# Patient Record
Sex: Female | Born: 1950 | Race: White | Hispanic: No | Marital: Married | Smoking: Former smoker
Health system: Southern US, Community
[De-identification: ages and names within clinical notes are randomized; demographics above are authoritative.]

## PROBLEM LIST (undated history)

## (undated) DIAGNOSIS — IMO0002 Reserved for concepts with insufficient information to code with codable children: Secondary | ICD-10-CM

## (undated) DIAGNOSIS — Z853 Personal history of malignant neoplasm of breast: Secondary | ICD-10-CM

## (undated) DIAGNOSIS — IMO0001 Reserved for inherently not codable concepts without codable children: Secondary | ICD-10-CM

## (undated) DIAGNOSIS — I341 Nonrheumatic mitral (valve) prolapse: Secondary | ICD-10-CM

## (undated) DIAGNOSIS — S329XXA Fracture of unspecified parts of lumbosacral spine and pelvis, initial encounter for closed fracture: Secondary | ICD-10-CM

## (undated) DIAGNOSIS — Z87440 Personal history of urinary (tract) infections: Secondary | ICD-10-CM

## (undated) DIAGNOSIS — C50919 Malignant neoplasm of unspecified site of unspecified female breast: Secondary | ICD-10-CM

## (undated) DIAGNOSIS — C7951 Secondary malignant neoplasm of bone: Secondary | ICD-10-CM

## (undated) HISTORY — PX: LASIK: SHX215

## (undated) HISTORY — PX: TONSILLECTOMY AND ADENOIDECTOMY: SUR1326

## (undated) HISTORY — DX: Fracture of unspecified parts of lumbosacral spine and pelvis, initial encounter for closed fracture: S32.9XXA

## (undated) HISTORY — DX: Malignant neoplasm of unspecified site of unspecified female breast: C50.919

## (undated) HISTORY — DX: Personal history of malignant neoplasm of breast: Z85.3

---

## 1992-01-23 HISTORY — PX: REDUCTION MAMMAPLASTY: SUR839

## 2002-01-22 HISTORY — PX: OTHER SURGICAL HISTORY: SHX169

## 2007-08-04 ENCOUNTER — Encounter: Admission: RE | Admit: 2007-08-04 | Discharge: 2007-08-04 | Payer: Self-pay | Admitting: Internal Medicine

## 2007-08-11 ENCOUNTER — Encounter: Admission: RE | Admit: 2007-08-11 | Discharge: 2007-08-11 | Payer: Self-pay | Admitting: Internal Medicine

## 2008-03-22 ENCOUNTER — Other Ambulatory Visit: Admission: RE | Admit: 2008-03-22 | Discharge: 2008-03-22 | Payer: Self-pay | Admitting: Obstetrics and Gynecology

## 2008-08-04 ENCOUNTER — Encounter: Admission: RE | Admit: 2008-08-04 | Discharge: 2008-08-04 | Payer: Self-pay | Admitting: Internal Medicine

## 2008-08-06 ENCOUNTER — Encounter: Admission: RE | Admit: 2008-08-06 | Discharge: 2008-08-06 | Payer: Self-pay | Admitting: Internal Medicine

## 2008-12-22 DIAGNOSIS — C50919 Malignant neoplasm of unspecified site of unspecified female breast: Secondary | ICD-10-CM

## 2008-12-22 HISTORY — DX: Malignant neoplasm of unspecified site of unspecified female breast: C50.919

## 2009-01-13 ENCOUNTER — Encounter: Admission: RE | Admit: 2009-01-13 | Discharge: 2009-01-13 | Payer: Self-pay | Admitting: Internal Medicine

## 2009-01-18 ENCOUNTER — Encounter: Admission: RE | Admit: 2009-01-18 | Discharge: 2009-01-18 | Payer: Self-pay | Admitting: Internal Medicine

## 2009-01-18 DIAGNOSIS — Z853 Personal history of malignant neoplasm of breast: Secondary | ICD-10-CM

## 2009-01-19 ENCOUNTER — Encounter: Admission: RE | Admit: 2009-01-19 | Discharge: 2009-01-19 | Payer: Self-pay | Admitting: Internal Medicine

## 2009-01-22 HISTORY — PX: MASTECTOMY: SHX3

## 2009-01-24 ENCOUNTER — Encounter: Admission: RE | Admit: 2009-01-24 | Discharge: 2009-01-24 | Payer: Self-pay | Admitting: Internal Medicine

## 2009-01-25 ENCOUNTER — Ambulatory Visit: Payer: Self-pay | Admitting: Oncology

## 2009-01-27 ENCOUNTER — Encounter: Admission: RE | Admit: 2009-01-27 | Discharge: 2009-01-27 | Payer: Self-pay | Admitting: General Surgery

## 2009-01-28 ENCOUNTER — Encounter: Payer: Self-pay | Admitting: Oncology

## 2009-01-28 ENCOUNTER — Ambulatory Visit: Payer: Self-pay

## 2009-01-28 ENCOUNTER — Ambulatory Visit (HOSPITAL_COMMUNITY): Admission: RE | Admit: 2009-01-28 | Discharge: 2009-01-28 | Payer: Self-pay | Admitting: Oncology

## 2009-01-28 ENCOUNTER — Ambulatory Visit: Payer: Self-pay | Admitting: Cardiovascular Disease

## 2009-01-28 LAB — CBC WITH DIFFERENTIAL/PLATELET
BASO%: 0.4 % (ref 0.0–2.0)
Eosinophils Absolute: 0.1 10*3/uL (ref 0.0–0.5)
HGB: 13.8 g/dL (ref 11.6–15.9)
MCHC: 34 g/dL (ref 31.5–36.0)
MCV: 89.7 fL (ref 79.5–101.0)
MONO#: 0.4 10*3/uL (ref 0.1–0.9)
NEUT#: 3.2 10*3/uL (ref 1.5–6.5)
Platelets: 276 10*3/uL (ref 145–400)
RDW: 12.2 % (ref 11.2–14.5)
WBC: 5.3 10*3/uL (ref 3.9–10.3)

## 2009-01-28 LAB — COMPREHENSIVE METABOLIC PANEL
ALT: 24 U/L (ref 0–35)
AST: 17 U/L (ref 0–37)
Albumin: 4.3 g/dL (ref 3.5–5.2)
Alkaline Phosphatase: 66 U/L (ref 39–117)
BUN: 15 mg/dL (ref 6–23)
Glucose, Bld: 109 mg/dL — ABNORMAL HIGH (ref 70–99)
Potassium: 3.7 mEq/L (ref 3.5–5.3)
Sodium: 141 mEq/L (ref 135–145)
Total Bilirubin: 0.4 mg/dL (ref 0.3–1.2)

## 2009-01-31 ENCOUNTER — Ambulatory Visit (HOSPITAL_BASED_OUTPATIENT_CLINIC_OR_DEPARTMENT_OTHER): Admission: RE | Admit: 2009-01-31 | Discharge: 2009-01-31 | Payer: Self-pay | Admitting: General Surgery

## 2009-02-01 ENCOUNTER — Ambulatory Visit (HOSPITAL_COMMUNITY): Admission: RE | Admit: 2009-02-01 | Discharge: 2009-02-01 | Payer: Self-pay | Admitting: Oncology

## 2009-02-15 LAB — CBC WITH DIFFERENTIAL/PLATELET
Basophils Absolute: 0 10*3/uL (ref 0.0–0.1)
Eosinophils Absolute: 0.3 10*3/uL (ref 0.0–0.5)
HCT: 38.2 % (ref 34.8–46.6)
HGB: 13.1 g/dL (ref 11.6–15.9)
MCHC: 34.3 g/dL (ref 31.5–36.0)
MCV: 89.4 fL (ref 79.5–101.0)
MONO#: 0.3 10*3/uL (ref 0.1–0.9)
MONO%: 6.4 % (ref 0.0–14.0)
NEUT%: 55.9 % (ref 38.4–76.8)
RDW: 12 % (ref 11.2–14.5)
lymph#: 1.4 10*3/uL (ref 0.9–3.3)

## 2009-02-22 LAB — CBC WITH DIFFERENTIAL/PLATELET
Basophils Absolute: 0.3 10*3/uL — ABNORMAL HIGH (ref 0.0–0.1)
Eosinophils Absolute: 0.1 10*3/uL (ref 0.0–0.5)
HCT: 41.8 % (ref 34.8–46.6)
HGB: 14.2 g/dL (ref 11.6–15.9)
LYMPH%: 22.8 % (ref 14.0–49.7)
MCV: 88.2 fL (ref 79.5–101.0)
MONO%: 9.4 % (ref 0.0–14.0)
NEUT#: 6.6 10*3/uL — ABNORMAL HIGH (ref 1.5–6.5)
Platelets: 186 10*3/uL (ref 145–400)

## 2009-02-25 ENCOUNTER — Ambulatory Visit: Payer: Self-pay | Admitting: Oncology

## 2009-02-28 ENCOUNTER — Ambulatory Visit: Payer: Self-pay | Admitting: Psychiatry

## 2009-03-01 LAB — CBC WITH DIFFERENTIAL/PLATELET
BASO%: 0.4 % (ref 0.0–2.0)
Basophils Absolute: 0 10*3/uL (ref 0.0–0.1)
EOS%: 0.5 % (ref 0.0–7.0)
HCT: 35.3 % (ref 34.8–46.6)
HGB: 12.2 g/dL (ref 11.6–15.9)
LYMPH%: 13.3 % — ABNORMAL LOW (ref 14.0–49.7)
MCH: 31.2 pg (ref 25.1–34.0)
MCHC: 34.6 g/dL (ref 31.5–36.0)
MONO#: 0.2 10*3/uL (ref 0.1–0.9)
NEUT%: 81.4 % — ABNORMAL HIGH (ref 38.4–76.8)
Platelets: 237 10*3/uL (ref 145–400)

## 2009-03-08 LAB — CBC WITH DIFFERENTIAL/PLATELET
BASO%: 0.3 % (ref 0.0–2.0)
EOS%: 0.6 % (ref 0.0–7.0)
HCT: 37.3 % (ref 34.8–46.6)
LYMPH%: 15.1 % (ref 14.0–49.7)
MCH: 31.1 pg (ref 25.1–34.0)
MCHC: 34 g/dL (ref 31.5–36.0)
MCV: 91.3 fL (ref 79.5–101.0)
MONO%: 8.8 % (ref 0.0–14.0)
NEUT%: 75.2 % (ref 38.4–76.8)
lymph#: 1.5 10*3/uL (ref 0.9–3.3)

## 2009-03-14 LAB — CBC WITH DIFFERENTIAL/PLATELET
BASO%: 0.1 % (ref 0.0–2.0)
EOS%: 0.3 % (ref 0.0–7.0)
HGB: 11.9 g/dL (ref 11.6–15.9)
MCH: 31.4 pg (ref 25.1–34.0)
MCHC: 34.5 g/dL (ref 31.5–36.0)
MCV: 90.9 fL (ref 79.5–101.0)
MONO%: 1.5 % (ref 0.0–14.0)
RBC: 3.8 10*6/uL (ref 3.70–5.45)
RDW: 13.3 % (ref 11.2–14.5)
lymph#: 0.7 10*3/uL — ABNORMAL LOW (ref 0.9–3.3)

## 2009-03-14 LAB — COMPREHENSIVE METABOLIC PANEL
ALT: 9 U/L (ref 0–35)
AST: 11 U/L (ref 0–37)
Albumin: 4.3 g/dL (ref 3.5–5.2)
Alkaline Phosphatase: 145 U/L — ABNORMAL HIGH (ref 39–117)
Calcium: 8.9 mg/dL (ref 8.4–10.5)
Chloride: 103 mEq/L (ref 96–112)
Potassium: 4.1 mEq/L (ref 3.5–5.3)
Sodium: 137 mEq/L (ref 135–145)

## 2009-03-22 LAB — CBC WITH DIFFERENTIAL/PLATELET
BASO%: 2 % (ref 0.0–2.0)
EOS%: 0.9 % (ref 0.0–7.0)
HCT: 37.2 % (ref 34.8–46.6)
LYMPH%: 13.3 % — ABNORMAL LOW (ref 14.0–49.7)
MCH: 30.4 pg (ref 25.1–34.0)
MCHC: 33.3 g/dL (ref 31.5–36.0)
NEUT%: 71.6 % (ref 38.4–76.8)
Platelets: 208 10*3/uL (ref 145–400)
RBC: 4.08 10*6/uL (ref 3.70–5.45)
lymph#: 1.5 10*3/uL (ref 0.9–3.3)
nRBC: 0 % (ref 0–0)

## 2009-03-24 ENCOUNTER — Ambulatory Visit: Payer: Self-pay | Admitting: Oncology

## 2009-03-25 ENCOUNTER — Encounter: Admission: RE | Admit: 2009-03-25 | Discharge: 2009-03-25 | Payer: Self-pay | Admitting: Oncology

## 2009-03-28 LAB — CBC WITH DIFFERENTIAL/PLATELET
BASO%: 1.1 % (ref 0.0–2.0)
EOS%: 0.2 % (ref 0.0–7.0)
MCH: 30.4 pg (ref 25.1–34.0)
MCV: 91.5 fL (ref 79.5–101.0)
MONO%: 1.8 % (ref 0.0–14.0)
RBC: 3.98 10*6/uL (ref 3.70–5.45)
RDW: 14.4 % (ref 11.2–14.5)
nRBC: 0 % (ref 0–0)

## 2009-03-31 ENCOUNTER — Encounter: Admission: RE | Admit: 2009-03-31 | Discharge: 2009-03-31 | Payer: Self-pay | Admitting: Oncology

## 2009-04-04 LAB — CBC WITH DIFFERENTIAL/PLATELET
BASO%: 1.3 % (ref 0.0–2.0)
LYMPH%: 10.2 % — ABNORMAL LOW (ref 14.0–49.7)
MCHC: 33.5 g/dL (ref 31.5–36.0)
MONO#: 1.6 10*3/uL — ABNORMAL HIGH (ref 0.1–0.9)
RBC: 3.88 10*6/uL (ref 3.70–5.45)
RDW: 15.6 % — ABNORMAL HIGH (ref 11.2–14.5)
WBC: 12.8 10*3/uL — ABNORMAL HIGH (ref 3.9–10.3)
lymph#: 1.3 10*3/uL (ref 0.9–3.3)
nRBC: 0 % (ref 0–0)

## 2009-04-12 LAB — CBC WITH DIFFERENTIAL/PLATELET
BASO%: 0.9 % (ref 0.0–2.0)
Basophils Absolute: 0.1 10*3/uL (ref 0.0–0.1)
EOS%: 1 % (ref 0.0–7.0)
HGB: 11.5 g/dL — ABNORMAL LOW (ref 11.6–15.9)
MCH: 30.7 pg (ref 25.1–34.0)
NEUT#: 4.3 10*3/uL (ref 1.5–6.5)
RDW: 15.6 % — ABNORMAL HIGH (ref 11.2–14.5)
lymph#: 0.7 10*3/uL — ABNORMAL LOW (ref 0.9–3.3)

## 2009-04-19 LAB — CBC WITH DIFFERENTIAL/PLATELET
Basophils Absolute: 0.1 10*3/uL (ref 0.0–0.1)
Eosinophils Absolute: 0.1 10*3/uL (ref 0.0–0.5)
HGB: 11.4 g/dL — ABNORMAL LOW (ref 11.6–15.9)
MCV: 92.7 fL (ref 79.5–101.0)
MONO#: 0.6 10*3/uL (ref 0.1–0.9)
NEUT#: 2.9 10*3/uL (ref 1.5–6.5)
RDW: 15.4 % — ABNORMAL HIGH (ref 11.2–14.5)
lymph#: 0.9 10*3/uL (ref 0.9–3.3)

## 2009-04-26 ENCOUNTER — Ambulatory Visit: Payer: Self-pay | Admitting: Oncology

## 2009-04-26 LAB — CBC WITH DIFFERENTIAL/PLATELET
BASO%: 0.7 % (ref 0.0–2.0)
Basophils Absolute: 0 10*3/uL (ref 0.0–0.1)
EOS%: 4.1 % (ref 0.0–7.0)
HGB: 11.4 g/dL — ABNORMAL LOW (ref 11.6–15.9)
MCH: 31.1 pg (ref 25.1–34.0)
MCHC: 33.1 g/dL (ref 31.5–36.0)
MCV: 93.7 fL (ref 79.5–101.0)
MONO%: 11.4 % (ref 0.0–14.0)
RBC: 3.67 10*6/uL — ABNORMAL LOW (ref 3.70–5.45)
RDW: 15.2 % — ABNORMAL HIGH (ref 11.2–14.5)
lymph#: 0.9 10*3/uL (ref 0.9–3.3)

## 2009-05-03 LAB — CBC WITH DIFFERENTIAL/PLATELET
Basophils Absolute: 0 10*3/uL (ref 0.0–0.1)
Eosinophils Absolute: 0.2 10*3/uL (ref 0.0–0.5)
HGB: 12 g/dL (ref 11.6–15.9)
MCV: 94.4 fL (ref 79.5–101.0)
MONO%: 9.3 % (ref 0.0–14.0)
NEUT#: 2.8 10*3/uL (ref 1.5–6.5)
RBC: 3.76 10*6/uL (ref 3.70–5.45)
RDW: 14.3 % (ref 11.2–14.5)
WBC: 4.5 10*3/uL (ref 3.9–10.3)
lymph#: 1 10*3/uL (ref 0.9–3.3)

## 2009-05-10 LAB — CBC WITH DIFFERENTIAL/PLATELET
Basophils Absolute: 0 10*3/uL (ref 0.0–0.1)
Eosinophils Absolute: 0.2 10*3/uL (ref 0.0–0.5)
HGB: 12.3 g/dL (ref 11.6–15.9)
LYMPH%: 24.9 % (ref 14.0–49.7)
MCV: 95.1 fL (ref 79.5–101.0)
MONO#: 0.3 10*3/uL (ref 0.1–0.9)
NEUT#: 2.6 10*3/uL (ref 1.5–6.5)
Platelets: 212 10*3/uL (ref 145–400)
RBC: 3.88 10*6/uL (ref 3.70–5.45)
RDW: 13.8 % (ref 11.2–14.5)
WBC: 4.1 10*3/uL (ref 3.9–10.3)

## 2009-05-13 ENCOUNTER — Encounter: Admission: RE | Admit: 2009-05-13 | Discharge: 2009-05-13 | Payer: Self-pay | Admitting: Oncology

## 2009-05-17 LAB — CBC WITH DIFFERENTIAL/PLATELET
Basophils Absolute: 0 10*3/uL (ref 0.0–0.1)
Eosinophils Absolute: 0.2 10*3/uL (ref 0.0–0.5)
HGB: 12.6 g/dL (ref 11.6–15.9)
MONO#: 0.5 10*3/uL (ref 0.1–0.9)
NEUT#: 2.7 10*3/uL (ref 1.5–6.5)
RBC: 3.95 10*6/uL (ref 3.70–5.45)
RDW: 13.1 % (ref 11.2–14.5)
WBC: 4.4 10*3/uL (ref 3.9–10.3)
lymph#: 1 10*3/uL (ref 0.9–3.3)

## 2009-05-17 LAB — COMPREHENSIVE METABOLIC PANEL
Albumin: 4 g/dL (ref 3.5–5.2)
BUN: 9 mg/dL (ref 6–23)
CO2: 27 mEq/L (ref 19–32)
Glucose, Bld: 96 mg/dL (ref 70–99)
Potassium: 4 mEq/L (ref 3.5–5.3)
Sodium: 138 mEq/L (ref 135–145)
Total Protein: 6.7 g/dL (ref 6.0–8.3)

## 2009-05-18 ENCOUNTER — Ambulatory Visit (HOSPITAL_COMMUNITY): Admission: RE | Admit: 2009-05-18 | Discharge: 2009-05-18 | Payer: Self-pay | Admitting: Oncology

## 2009-05-24 LAB — CBC WITH DIFFERENTIAL/PLATELET
BASO%: 0.2 % (ref 0.0–2.0)
Basophils Absolute: 0 10*3/uL (ref 0.0–0.1)
EOS%: 5 % (ref 0.0–7.0)
HCT: 36.6 % (ref 34.8–46.6)
HGB: 12.3 g/dL (ref 11.6–15.9)
LYMPH%: 25.2 % (ref 14.0–49.7)
MCH: 32 pg (ref 25.1–34.0)
MCHC: 33.6 g/dL (ref 31.5–36.0)
MONO#: 0.3 10*3/uL (ref 0.1–0.9)
NEUT%: 62.4 % (ref 38.4–76.8)
Platelets: 232 10*3/uL (ref 145–400)

## 2009-05-30 ENCOUNTER — Ambulatory Visit: Payer: Self-pay | Admitting: Oncology

## 2009-05-31 LAB — CBC WITH DIFFERENTIAL/PLATELET
BASO%: 1.2 % (ref 0.0–2.0)
Basophils Absolute: 0.1 10*3/uL (ref 0.0–0.1)
EOS%: 4 % (ref 0.0–7.0)
HCT: 37.3 % (ref 34.8–46.6)
MCH: 33.3 pg (ref 25.1–34.0)
MCHC: 34.8 g/dL (ref 31.5–36.0)
MCV: 95.7 fL (ref 79.5–101.0)
MONO%: 10 % (ref 0.0–14.0)
NEUT%: 63.9 % (ref 38.4–76.8)
lymph#: 0.9 10*3/uL (ref 0.9–3.3)

## 2009-06-03 ENCOUNTER — Encounter: Admission: RE | Admit: 2009-06-03 | Discharge: 2009-06-03 | Payer: Self-pay | Admitting: Oncology

## 2009-06-13 ENCOUNTER — Inpatient Hospital Stay (HOSPITAL_COMMUNITY): Admission: RE | Admit: 2009-06-13 | Discharge: 2009-06-15 | Payer: Self-pay | Admitting: Surgery

## 2009-06-13 ENCOUNTER — Encounter (INDEPENDENT_AMBULATORY_CARE_PROVIDER_SITE_OTHER): Payer: Self-pay | Admitting: Surgery

## 2009-06-23 ENCOUNTER — Ambulatory Visit: Admission: RE | Admit: 2009-06-23 | Discharge: 2009-09-08 | Payer: Self-pay | Admitting: Radiation Oncology

## 2009-06-30 ENCOUNTER — Ambulatory Visit: Payer: Self-pay | Admitting: Oncology

## 2009-07-04 LAB — COMPREHENSIVE METABOLIC PANEL
ALT: 26 U/L (ref 0–35)
AST: 23 U/L (ref 0–37)
Albumin: 3.9 g/dL (ref 3.5–5.2)
Calcium: 9.6 mg/dL (ref 8.4–10.5)
Chloride: 105 mEq/L (ref 96–112)
Potassium: 4.5 mEq/L (ref 3.5–5.3)
Sodium: 141 mEq/L (ref 135–145)

## 2009-07-04 LAB — CBC WITH DIFFERENTIAL/PLATELET
BASO%: 0.5 % (ref 0.0–2.0)
Basophils Absolute: 0 10*3/uL (ref 0.0–0.1)
EOS%: 9.6 % — ABNORMAL HIGH (ref 0.0–7.0)
HGB: 12.6 g/dL (ref 11.6–15.9)
MCH: 31.7 pg (ref 25.1–34.0)
MCHC: 33.8 g/dL (ref 31.5–36.0)
RBC: 3.96 10*6/uL (ref 3.70–5.45)
RDW: 11.9 % (ref 11.2–14.5)
lymph#: 0.8 10*3/uL — ABNORMAL LOW (ref 0.9–3.3)

## 2009-08-25 ENCOUNTER — Ambulatory Visit: Payer: Self-pay | Admitting: Oncology

## 2009-08-29 LAB — CBC WITH DIFFERENTIAL/PLATELET
BASO%: 0.3 % (ref 0.0–2.0)
Basophils Absolute: 0 10*3/uL (ref 0.0–0.1)
Eosinophils Absolute: 0.1 10*3/uL (ref 0.0–0.5)
HGB: 13 g/dL (ref 11.6–15.9)
LYMPH%: 10.9 % — ABNORMAL LOW (ref 14.0–49.7)
MCHC: 34.5 g/dL (ref 31.5–36.0)
MCV: 89.3 fL (ref 79.5–101.0)
MONO#: 0.5 10*3/uL (ref 0.1–0.9)
MONO%: 8.7 % (ref 0.0–14.0)
NEUT#: 4.1 10*3/uL (ref 1.5–6.5)
NEUT%: 78.6 % — ABNORMAL HIGH (ref 38.4–76.8)
RBC: 4.21 10*6/uL (ref 3.70–5.45)

## 2009-08-29 LAB — COMPREHENSIVE METABOLIC PANEL
ALT: 20 U/L (ref 0–35)
Albumin: 4.5 g/dL (ref 3.5–5.2)
Calcium: 9.7 mg/dL (ref 8.4–10.5)
Chloride: 100 mEq/L (ref 96–112)
Total Bilirubin: 0.3 mg/dL (ref 0.3–1.2)

## 2009-11-22 ENCOUNTER — Ambulatory Visit: Payer: Self-pay | Admitting: Oncology

## 2009-11-24 LAB — CANCER ANTIGEN 27.29: CA 27.29: 14 U/mL (ref 0–39)

## 2009-11-24 LAB — CBC WITH DIFFERENTIAL/PLATELET
BASO%: 0.2 % (ref 0.0–2.0)
Basophils Absolute: 0 10*3/uL (ref 0.0–0.1)
EOS%: 3.7 % (ref 0.0–7.0)
HGB: 13.2 g/dL (ref 11.6–15.9)
MCH: 31.1 pg (ref 25.1–34.0)
MCHC: 34.7 g/dL (ref 31.5–36.0)
MCV: 89.6 fL (ref 79.5–101.0)
MONO%: 11.1 % (ref 0.0–14.0)
NEUT%: 64.3 % (ref 38.4–76.8)
RDW: 12.3 % (ref 11.2–14.5)
lymph#: 1 10*3/uL (ref 0.9–3.3)

## 2009-11-24 LAB — COMPREHENSIVE METABOLIC PANEL
Albumin: 4.7 g/dL (ref 3.5–5.2)
BUN: 13 mg/dL (ref 6–23)
CO2: 24 mEq/L (ref 19–32)
Calcium: 9.5 mg/dL (ref 8.4–10.5)
Chloride: 105 mEq/L (ref 96–112)
Creatinine, Ser: 0.87 mg/dL (ref 0.40–1.20)
Glucose, Bld: 82 mg/dL (ref 70–99)
Potassium: 4.4 mEq/L (ref 3.5–5.3)

## 2010-02-11 ENCOUNTER — Other Ambulatory Visit: Payer: Self-pay | Admitting: Obstetrics & Gynecology

## 2010-02-11 DIAGNOSIS — M8580 Other specified disorders of bone density and structure, unspecified site: Secondary | ICD-10-CM

## 2010-04-05 ENCOUNTER — Ambulatory Visit
Admission: RE | Admit: 2010-04-05 | Discharge: 2010-04-05 | Disposition: A | Discharge status: Home / Self Care | Payer: BC Managed Care – PPO | Source: Ambulatory Visit | Attending: Obstetrics & Gynecology | Admitting: Obstetrics & Gynecology

## 2010-04-05 DIAGNOSIS — M8580 Other specified disorders of bone density and structure, unspecified site: Secondary | ICD-10-CM

## 2010-04-09 LAB — COMPREHENSIVE METABOLIC PANEL
ALT: 32 U/L (ref 0–35)
AST: 26 U/L (ref 0–37)
Alkaline Phosphatase: 65 U/L (ref 39–117)
CO2: 28 mEq/L (ref 19–32)
Calcium: 9.8 mg/dL (ref 8.4–10.5)
GFR calc Af Amer: >60 mL/min (ref >60)
GFR calc non Af Amer: >60 mL/min (ref >60)
Glucose, Bld: 123 mg/dL — ABNORMAL HIGH (ref 70–99)
Potassium: 3.9 mEq/L (ref 3.5–5.1)
Sodium: 139 mEq/L (ref 135–145)
Total Protein: 6.4 g/dL (ref 6.0–8.3)

## 2010-04-09 LAB — CBC
Hemoglobin: 13.8 g/dL (ref 12.0–15.0)
MCHC: 34.7 g/dL (ref 30.0–36.0)
RBC: 4.44 MIL/uL (ref 3.87–5.11)

## 2010-04-09 LAB — DIFFERENTIAL
Basophils Relative: 0 % (ref 0–1)
Eosinophils Absolute: 0 10*3/uL (ref 0.0–0.7)
Eosinophils Relative: 1 % (ref 0–5)
Lymphs Abs: 1.6 10*3/uL (ref 0.7–4.0)
Monocytes Relative: 6 % (ref 3–12)
Neutrophils Relative %: 64 % (ref 43–77)

## 2010-04-09 LAB — GLUCOSE, CAPILLARY: Glucose-Capillary: 89 mg/dL (ref 70–99)

## 2010-04-10 LAB — BASIC METABOLIC PANEL
CO2: 25 mEq/L (ref 19–32)
Calcium: 9.6 mg/dL (ref 8.4–10.5)
Chloride: 108 mEq/L (ref 96–112)
Creatinine, Ser: 0.96 mg/dL (ref 0.4–1.2)
GFR calc Af Amer: >60 mL/min (ref >60)
Sodium: 140 mEq/L (ref 135–145)

## 2010-04-10 LAB — CBC
Hemoglobin: 13 g/dL (ref 12.0–15.0)
MCHC: 33.3 g/dL (ref 30.0–36.0)
MCV: 97.7 fL (ref 78.0–100.0)
RBC: 3.99 MIL/uL (ref 3.87–5.11)
WBC: 5 10*3/uL (ref 4.0–10.5)

## 2010-04-10 LAB — DIFFERENTIAL
Basophils Relative: 1 % (ref 0–1)
Lymphs Abs: 0.9 10*3/uL (ref 0.7–4.0)
Monocytes Absolute: 0.5 10*3/uL (ref 0.1–1.0)
Monocytes Relative: 10 % (ref 3–12)
Neutro Abs: 3.3 10*3/uL (ref 1.7–7.7)
Neutrophils Relative %: 65 % (ref 43–77)

## 2010-04-13 ENCOUNTER — Ambulatory Visit: Payer: BC Managed Care – PPO | Attending: Radiation Oncology | Admitting: Radiation Oncology

## 2010-05-22 ENCOUNTER — Encounter (HOSPITAL_BASED_OUTPATIENT_CLINIC_OR_DEPARTMENT_OTHER): Payer: BC Managed Care – PPO | Admitting: Oncology

## 2010-05-22 ENCOUNTER — Other Ambulatory Visit: Payer: Self-pay | Admitting: Oncology

## 2010-05-22 DIAGNOSIS — C50119 Malignant neoplasm of central portion of unspecified female breast: Secondary | ICD-10-CM

## 2010-05-22 DIAGNOSIS — C773 Secondary and unspecified malignant neoplasm of axilla and upper limb lymph nodes: Secondary | ICD-10-CM

## 2010-05-22 DIAGNOSIS — Z170 Estrogen receptor positive status [ER+]: Secondary | ICD-10-CM

## 2010-05-22 DIAGNOSIS — R232 Flushing: Secondary | ICD-10-CM

## 2010-05-22 LAB — COMPREHENSIVE METABOLIC PANEL
AST: 18 U/L (ref 0–37)
Albumin: 4.2 g/dL (ref 3.5–5.2)
Alkaline Phosphatase: 76 U/L (ref 39–117)
BUN: 14 mg/dL (ref 6–23)
Creatinine, Ser: 0.85 mg/dL (ref 0.40–1.20)
Glucose, Bld: 86 mg/dL (ref 70–99)
Potassium: 4.6 mEq/L (ref 3.5–5.3)
Total Bilirubin: 0.4 mg/dL (ref 0.3–1.2)

## 2010-05-22 LAB — CBC WITH DIFFERENTIAL/PLATELET
Basophils Absolute: 0 10*3/uL (ref 0.0–0.1)
EOS%: 2.3 % (ref 0.0–7.0)
Eosinophils Absolute: 0.1 10*3/uL (ref 0.0–0.5)
HCT: 37.5 % (ref 34.8–46.6)
HGB: 12.8 g/dL (ref 11.6–15.9)
LYMPH%: 28.9 % (ref 14.0–49.7)
MCH: 30.4 pg (ref 25.1–34.0)
MCV: 89.2 fL (ref 79.5–101.0)
MONO%: 10.3 % (ref 0.0–14.0)
NEUT#: 2.7 10*3/uL (ref 1.5–6.5)
NEUT%: 58.1 % (ref 38.4–76.8)
Platelets: 226 10*3/uL (ref 145–400)

## 2010-05-29 ENCOUNTER — Encounter (HOSPITAL_BASED_OUTPATIENT_CLINIC_OR_DEPARTMENT_OTHER): Payer: BC Managed Care – PPO | Admitting: Oncology

## 2010-05-29 DIAGNOSIS — C773 Secondary and unspecified malignant neoplasm of axilla and upper limb lymph nodes: Secondary | ICD-10-CM

## 2010-05-29 DIAGNOSIS — C50119 Malignant neoplasm of central portion of unspecified female breast: Secondary | ICD-10-CM

## 2010-05-29 DIAGNOSIS — Z170 Estrogen receptor positive status [ER+]: Secondary | ICD-10-CM

## 2010-05-29 DIAGNOSIS — R232 Flushing: Secondary | ICD-10-CM

## 2010-07-24 ENCOUNTER — Other Ambulatory Visit: Payer: Self-pay | Admitting: Oncology

## 2010-07-24 ENCOUNTER — Encounter (HOSPITAL_BASED_OUTPATIENT_CLINIC_OR_DEPARTMENT_OTHER): Payer: BC Managed Care – PPO | Admitting: Oncology

## 2010-07-24 ENCOUNTER — Ambulatory Visit (HOSPITAL_COMMUNITY)
Admission: RE | Admit: 2010-07-24 | Discharge: 2010-07-24 | Disposition: A | Discharge status: Home / Self Care | Payer: Federal, State, Local not specified - PPO | Source: Ambulatory Visit | Attending: Oncology | Admitting: Oncology

## 2010-07-24 DIAGNOSIS — Z170 Estrogen receptor positive status [ER+]: Secondary | ICD-10-CM

## 2010-07-24 DIAGNOSIS — C50919 Malignant neoplasm of unspecified site of unspecified female breast: Secondary | ICD-10-CM

## 2010-07-24 DIAGNOSIS — R232 Flushing: Secondary | ICD-10-CM

## 2010-07-24 DIAGNOSIS — C50119 Malignant neoplasm of central portion of unspecified female breast: Secondary | ICD-10-CM

## 2010-07-24 DIAGNOSIS — C773 Secondary and unspecified malignant neoplasm of axilla and upper limb lymph nodes: Secondary | ICD-10-CM

## 2010-07-24 LAB — VITAMIN D 25 HYDROXY (VIT D DEFICIENCY, FRACTURES): Vit D, 25-Hydroxy: 34 ng/mL (ref 30–89)

## 2010-07-24 LAB — COMPREHENSIVE METABOLIC PANEL
Albumin: 4.4 g/dL (ref 3.5–5.2)
BUN: 13 mg/dL (ref 6–23)
CO2: 26 mEq/L (ref 19–32)
Glucose, Bld: 88 mg/dL (ref 70–99)
Potassium: 3.9 mEq/L (ref 3.5–5.3)
Sodium: 140 mEq/L (ref 135–145)
Total Bilirubin: 0.4 mg/dL (ref 0.3–1.2)
Total Protein: 6.7 g/dL (ref 6.0–8.3)

## 2010-07-24 LAB — CBC WITH DIFFERENTIAL/PLATELET
Basophils Absolute: 0 10*3/uL (ref 0.0–0.1)
Eosinophils Absolute: 0.3 10*3/uL (ref 0.0–0.5)
HGB: 13.5 g/dL (ref 11.6–15.9)
LYMPH%: 23.3 % (ref 14.0–49.7)
MCV: 90.1 fL (ref 79.5–101.0)
MONO#: 0.4 10*3/uL (ref 0.1–0.9)
NEUT#: 3.4 10*3/uL (ref 1.5–6.5)
Platelets: 256 10*3/uL (ref 145–400)
RBC: 4.34 10*6/uL (ref 3.70–5.45)
WBC: 5.4 10*3/uL (ref 3.9–10.3)

## 2010-07-24 LAB — CANCER ANTIGEN 27.29: CA 27.29: 17 U/mL (ref 0–39)

## 2010-07-24 LAB — FOLLICLE STIMULATING HORMONE: FSH: 92.5 m[IU]/mL

## 2010-07-31 ENCOUNTER — Encounter (HOSPITAL_BASED_OUTPATIENT_CLINIC_OR_DEPARTMENT_OTHER): Payer: Federal, State, Local not specified - PPO | Admitting: Oncology

## 2010-07-31 ENCOUNTER — Ambulatory Visit (HOSPITAL_COMMUNITY)
Admission: RE | Admit: 2010-07-31 | Discharge: 2010-07-31 | Disposition: A | Discharge status: Home / Self Care | Payer: Federal, State, Local not specified - PPO | Source: Ambulatory Visit | Attending: Oncology | Admitting: Oncology

## 2010-07-31 ENCOUNTER — Other Ambulatory Visit: Payer: Self-pay | Admitting: Oncology

## 2010-07-31 DIAGNOSIS — S92919A Unspecified fracture of unspecified toe(s), initial encounter for closed fracture: Secondary | ICD-10-CM | POA: Insufficient documentation

## 2010-07-31 DIAGNOSIS — C773 Secondary and unspecified malignant neoplasm of axilla and upper limb lymph nodes: Secondary | ICD-10-CM

## 2010-07-31 DIAGNOSIS — M79609 Pain in unspecified limb: Secondary | ICD-10-CM | POA: Insufficient documentation

## 2010-07-31 DIAGNOSIS — R52 Pain, unspecified: Secondary | ICD-10-CM

## 2010-07-31 DIAGNOSIS — W19XXXA Unspecified fall, initial encounter: Secondary | ICD-10-CM | POA: Insufficient documentation

## 2010-07-31 DIAGNOSIS — C50119 Malignant neoplasm of central portion of unspecified female breast: Secondary | ICD-10-CM

## 2010-07-31 DIAGNOSIS — Z170 Estrogen receptor positive status [ER+]: Secondary | ICD-10-CM

## 2010-08-04 ENCOUNTER — Encounter (HOSPITAL_BASED_OUTPATIENT_CLINIC_OR_DEPARTMENT_OTHER): Payer: Federal, State, Local not specified - PPO | Admitting: Oncology

## 2010-08-04 DIAGNOSIS — M899 Disorder of bone, unspecified: Secondary | ICD-10-CM

## 2010-08-04 DIAGNOSIS — C50119 Malignant neoplasm of central portion of unspecified female breast: Secondary | ICD-10-CM

## 2010-08-04 DIAGNOSIS — Z780 Asymptomatic menopausal state: Secondary | ICD-10-CM

## 2010-10-19 ENCOUNTER — Other Ambulatory Visit: Payer: Self-pay | Admitting: Radiation Oncology

## 2010-10-19 ENCOUNTER — Ambulatory Visit
Admission: RE | Admit: 2010-10-19 | Discharge: 2010-10-19 | Disposition: A | Discharge status: Home / Self Care | Payer: Federal, State, Local not specified - PPO | Source: Ambulatory Visit | Attending: Radiation Oncology | Admitting: Radiation Oncology

## 2010-10-19 ENCOUNTER — Ambulatory Visit (HOSPITAL_COMMUNITY)
Admission: RE | Admit: 2010-10-19 | Discharge: 2010-10-19 | Disposition: A | Discharge status: Home / Self Care | Payer: Federal, State, Local not specified - PPO | Source: Ambulatory Visit | Attending: Radiation Oncology | Admitting: Radiation Oncology

## 2010-10-19 DIAGNOSIS — Z853 Personal history of malignant neoplasm of breast: Secondary | ICD-10-CM | POA: Insufficient documentation

## 2010-10-19 DIAGNOSIS — Z9010 Acquired absence of unspecified breast and nipple: Secondary | ICD-10-CM

## 2010-10-19 DIAGNOSIS — R079 Chest pain, unspecified: Secondary | ICD-10-CM | POA: Insufficient documentation

## 2010-10-19 DIAGNOSIS — C50919 Malignant neoplasm of unspecified site of unspecified female breast: Secondary | ICD-10-CM

## 2010-10-20 ENCOUNTER — Ambulatory Visit
Admission: RE | Admit: 2010-10-20 | Discharge: 2010-10-20 | Disposition: A | Discharge status: Home / Self Care | Payer: Federal, State, Local not specified - PPO | Source: Ambulatory Visit | Attending: Radiation Oncology | Admitting: Radiation Oncology

## 2010-10-20 ENCOUNTER — Encounter (INDEPENDENT_AMBULATORY_CARE_PROVIDER_SITE_OTHER): Payer: Self-pay | Admitting: Surgery

## 2010-10-20 ENCOUNTER — Other Ambulatory Visit: Payer: Self-pay | Admitting: Radiation Oncology

## 2010-10-20 DIAGNOSIS — Z9010 Acquired absence of unspecified breast and nipple: Secondary | ICD-10-CM

## 2010-10-20 DIAGNOSIS — Z853 Personal history of malignant neoplasm of breast: Secondary | ICD-10-CM

## 2010-10-20 HISTORY — DX: Personal history of malignant neoplasm of breast: Z85.3

## 2010-12-12 ENCOUNTER — Other Ambulatory Visit: Payer: Self-pay | Admitting: Oncology

## 2010-12-12 ENCOUNTER — Other Ambulatory Visit (HOSPITAL_BASED_OUTPATIENT_CLINIC_OR_DEPARTMENT_OTHER): Payer: Federal, State, Local not specified - PPO | Admitting: Lab

## 2010-12-12 DIAGNOSIS — Z170 Estrogen receptor positive status [ER+]: Secondary | ICD-10-CM

## 2010-12-12 DIAGNOSIS — R232 Flushing: Secondary | ICD-10-CM

## 2010-12-12 DIAGNOSIS — C773 Secondary and unspecified malignant neoplasm of axilla and upper limb lymph nodes: Secondary | ICD-10-CM

## 2010-12-12 DIAGNOSIS — C50119 Malignant neoplasm of central portion of unspecified female breast: Secondary | ICD-10-CM

## 2010-12-12 LAB — CBC WITH DIFFERENTIAL/PLATELET
BASO%: 0.5 % (ref 0.0–2.0)
Basophils Absolute: 0 10*3/uL (ref 0.0–0.1)
HCT: 39.2 % (ref 34.8–46.6)
HGB: 13.2 g/dL (ref 11.6–15.9)
LYMPH%: 20.8 % (ref 14.0–49.7)
MCHC: 33.7 g/dL (ref 31.5–36.0)
MONO#: 0.4 10*3/uL (ref 0.1–0.9)
NEUT%: 68.7 % (ref 38.4–76.8)
Platelets: 241 10*3/uL (ref 145–400)
WBC: 5.2 10*3/uL (ref 3.9–10.3)
lymph#: 1.1 10*3/uL (ref 0.9–3.3)

## 2010-12-13 LAB — HEPATITIS C ANTIBODY: HCV Ab: NEGATIVE

## 2010-12-13 LAB — COMPREHENSIVE METABOLIC PANEL
ALT: 19 U/L (ref 0–35)
BUN: 14 mg/dL (ref 6–23)
CO2: 27 mEq/L (ref 19–32)
Calcium: 10.1 mg/dL (ref 8.4–10.5)
Chloride: 105 mEq/L (ref 96–112)
Creatinine, Ser: 0.96 mg/dL (ref 0.50–1.10)
Glucose, Bld: 81 mg/dL (ref 70–99)
Total Bilirubin: 0.3 mg/dL (ref 0.3–1.2)

## 2010-12-13 LAB — CANCER ANTIGEN 27.29: CA 27.29: 20 U/mL (ref 0–39)

## 2010-12-13 LAB — VITAMIN D 25 HYDROXY (VIT D DEFICIENCY, FRACTURES): Vit D, 25-Hydroxy: 37 ng/mL (ref 30–89)

## 2010-12-18 ENCOUNTER — Encounter: Payer: Self-pay | Admitting: *Deleted

## 2010-12-19 ENCOUNTER — Ambulatory Visit (HOSPITAL_BASED_OUTPATIENT_CLINIC_OR_DEPARTMENT_OTHER): Payer: Federal, State, Local not specified - PPO | Admitting: Oncology

## 2010-12-19 VITALS — BP 127/78 | HR 83 | Temp 97.9°F | Ht 64.5 in | Wt 176.0 lb

## 2010-12-19 DIAGNOSIS — C50919 Malignant neoplasm of unspecified site of unspecified female breast: Secondary | ICD-10-CM

## 2010-12-19 DIAGNOSIS — C50519 Malignant neoplasm of lower-outer quadrant of unspecified female breast: Secondary | ICD-10-CM

## 2010-12-19 DIAGNOSIS — F4310 Post-traumatic stress disorder, unspecified: Secondary | ICD-10-CM

## 2010-12-19 DIAGNOSIS — Z170 Estrogen receptor positive status [ER+]: Secondary | ICD-10-CM

## 2010-12-19 NOTE — Progress Notes (Signed)
ID: Brooke [name]   Interval History: Brooke Bible returns today with her husband still for followup of her breast cancer. The interval history is generally unremarkable. She has an elliptical machine at home which she uses 4 days a week. She also follows a Pilates tape. 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 She is also busy with DNA ancestry and has discovered of among other things that Brooke [name] is more than 90% [place] Jewish. Her own genetic background is largely northern European  ROS:  She continues to have symptoms consistent with mild posttraumatic stress, with occasional near panic feelings, and she deals with his through prayer, meditation, exercise, and the occasional lorazepam. She has about 10 hot flashes daily but is sleeping well despite that she is tolerating her letrozole without other side effects of concern and in fact a detailed review of systems was otherwise unremarkable  Medications: I have reviewed the patient's current medications.  Current Outpatient Prescriptions  Medication Sig Dispense Refill  . calcium carbonate (TUMS - DOSED IN MG ELEMENTAL CALCIUM) 500 MG chewable tablet Chew 1 tablet by mouth 2 (two) times daily.        [name] Kitchen gabapentin (NEURONTIN) 300 MG capsule Take 300 mg by mouth Nightly.        [name] Kitchen letrozole (FEMARA) 2.5 MG tablet Take 2.5 mg by mouth daily.        [name] Kitchen LORazepam (ATIVAN) 0.5 MG tablet Take 0.5 mg by mouth 2 (two) times daily as needed.        . valACYclovir (VALTREX) 500 MG tablet Take 500 mg by mouth 2 (two) times daily.           Objective: Vital signs in last 24 hours: BP 127/78  Pulse 83  Temp(Src) 97.9 F (36.6 C) (Oral)  Ht 5' 4.5" (1.638 m)  Wt 176 lb (79.833 kg)  BMI 29.74 kg/m2   Physical Exam:    Sclerae unicteric  Oropharynx clear  No peripheral adenopathy  Lungs clear -- no rales or rhonchi  Heart regular rate and rhythm  Abdomen benign  MSK no focal spinal tenderness, no peripheral  edema  Neuro nonfocal  Breast exam: She status post bilateral mastectomies. No evidence of recurrence in the left chest wall.  Lab Results:  CMP      Component Value Date/Time   NA 143 12/12/2010 1325   NA 143 12/12/2010 1325   NA 143 12/12/2010 1325   NA 143 12/12/2010 1325   NA 143 12/12/2010 1325   K 4.7 12/12/2010 1325   K 4.7 12/12/2010 1325   K 4.7 12/12/2010 1325   K 4.7 12/12/2010 1325   K 4.7 12/12/2010 1325   CL 105 12/12/2010 1325   CL 105 12/12/2010 1325   CL 105 12/12/2010 1325   CL 105 12/12/2010 1325   CL 105 12/12/2010 1325   CO2 27 12/12/2010 1325   CO2 27 12/12/2010 1325   CO2 27 12/12/2010 1325   CO2 27 12/12/2010 1325   CO2 27 12/12/2010 1325   GLUCOSE 81 12/12/2010 1325   GLUCOSE 81 12/12/2010 1325   GLUCOSE 81 12/12/2010 1325   GLUCOSE 81 12/12/2010 1325   GLUCOSE 81 12/12/2010 1325   BUN 14 12/12/2010 1325   BUN 14 12/12/2010 1325   BUN 14 12/12/2010 1325   BUN 14 12/12/2010 1325   BUN 14 12/12/2010 1325   CREATININE 0.96 12/12/2010 1325   CREATININE 0.96 12/12/2010 1325   CREATININE 0.96  12/12/2010 1325   CREATININE 0.96 12/12/2010 1325   CREATININE 0.96 12/12/2010 1325   CALCIUM 10.1 12/12/2010 1325   CALCIUM 10.1 12/12/2010 1325   CALCIUM 10.1 12/12/2010 1325   CALCIUM 10.1 12/12/2010 1325   CALCIUM 10.1 12/12/2010 1325   PROT 6.2 12/12/2010 1325   PROT 6.2 12/12/2010 1325   PROT 6.2 12/12/2010 1325   PROT 6.2 12/12/2010 1325   PROT 6.2 12/12/2010 1325   ALBUMIN 4.2 12/12/2010 1325   ALBUMIN 4.2 12/12/2010 1325   ALBUMIN 4.2 12/12/2010 1325   ALBUMIN 4.2 12/12/2010 1325   ALBUMIN 4.2 12/12/2010 1325   AST 19 12/12/2010 1325   AST 19 12/12/2010 1325   AST 19 12/12/2010 1325   AST 19 12/12/2010 1325   AST 19 12/12/2010 1325   ALT 19 12/12/2010 1325   ALT 19 12/12/2010 1325   ALT 19 12/12/2010 1325   ALT 19 12/12/2010 1325   ALT 19 12/12/2010 1325   ALKPHOS 54 12/12/2010 1325   ALKPHOS 54 12/12/2010 1325   ALKPHOS 54  12/12/2010 1325   ALKPHOS 54 12/12/2010 1325   ALKPHOS 54 12/12/2010 1325   BILITOT 0.3 12/12/2010 1325   BILITOT 0.3 12/12/2010 1325   BILITOT 0.3 12/12/2010 1325   BILITOT 0.3 12/12/2010 1325   BILITOT 0.3 12/12/2010 1325   GFRNONAA >60 06/06/2009 0845   GFRAA  Value: >60        The eGFR has been calculated using the MDRD equation. This calculation has not been validated in all clinical situations. eGFR's persistently <60 mL/min signify possible Chronic Kidney Disease. 06/06/2009 0845    CBC Lab Results  Component Value Date   WBC 5.2 12/12/2010   HGB 13.2 12/12/2010   HCT 39.2 12/12/2010   MCV 89.7 12/12/2010   PLT 241 12/12/2010    Studies/Results:   she had a left breast ultrasound 10/20/2010 for a firm mobile mass in the lateral aspect of her left mastectomy scar. Biopsy was performed and showed  fat necrosis. She had a left foot film that showed a medial great toe sesamoid fracture following a fall. 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 She was treated neoadjuvantly with dose dense cyclophosphamide and doxorubicin followed by weekly paclitaxel x7 followed by bilateral mastectomies May 2011. She had a residual 1.5 cm tumor involving one of 8 sampled lymph nodes. Margins were negative. She completed radiation in August of 2011 and started letrozole at that time.   Plan: The plan is to continue letrozole for total 5 years. She also received zoledronic acid in July of 2012, and will receive her next dose in January. She will see me again in may of 2013 for routine followup. At this point I am delighted at how well she is doing both from a psychosocial point of view and a breast cancer point of view. She knows to call  for any problems that may develop before the next visit.  Brooke [name] C 12/21/2010

## 2010-12-20 ENCOUNTER — Telehealth: Payer: Self-pay | Admitting: Oncology

## 2010-12-20 NOTE — Telephone Encounter (Signed)
gv pt appt for jan and [id number]. scheduled pt for ct scan for 05/28/2011

## 2010-12-22 ENCOUNTER — Telehealth: Payer: Self-pay

## 2010-12-22 ENCOUNTER — Other Ambulatory Visit: Payer: Self-pay | Admitting: Physician Assistant

## 2010-12-22 DIAGNOSIS — Z853 Personal history of malignant neoplasm of breast: Secondary | ICD-10-CM

## 2010-12-22 DIAGNOSIS — C50119 Malignant neoplasm of central portion of unspecified female breast: Secondary | ICD-10-CM

## 2010-12-22 MED ORDER — VENLAFAXINE HCL 37.5 MG PO TABS: 37.5000 mg | ORAL_TABLET | Freq: Two times a day (BID) | ORAL | Status: DC

## 2010-12-22 MED ORDER — GABAPENTIN 300 MG PO CAPS: 300.0000 mg | ORAL_CAPSULE | Freq: Every evening | ORAL | Status: DC

## 2010-12-22 NOTE — Telephone Encounter (Signed)
Received message from pt stating that she needs her venlafaxine prescription renewed with [facility].  She is requesting a 90 day supply with 3 refills, states she take 2 37.5 mg caps daily.  Ok per [employer], [place].  Prescription faxed to [phone].

## 2010-12-28 ENCOUNTER — Other Ambulatory Visit: Payer: Self-pay | Admitting: *Deleted

## 2010-12-28 DIAGNOSIS — Z853 Personal history of malignant neoplasm of breast: Secondary | ICD-10-CM

## 2010-12-28 MED ORDER — VENLAFAXINE HCL ER 37.5 MG PO CP24: 37.5000 mg | ORAL_CAPSULE | Freq: Every day | ORAL | Status: DC

## 2011-01-03 ENCOUNTER — Other Ambulatory Visit: Payer: Self-pay | Admitting: *Deleted

## 2011-01-03 DIAGNOSIS — Z853 Personal history of malignant neoplasm of breast: Secondary | ICD-10-CM

## 2011-01-03 MED ORDER — VENLAFAXINE HCL ER 37.5 MG PO CP24: ORAL_CAPSULE | ORAL | Status: DC

## 2011-02-13 ENCOUNTER — Ambulatory Visit (HOSPITAL_BASED_OUTPATIENT_CLINIC_OR_DEPARTMENT_OTHER): Payer: Federal, State, Local not specified - PPO

## 2011-02-13 VITALS — BP 124/80 | HR 76 | Temp 98.2°F

## 2011-02-13 DIAGNOSIS — Z853 Personal history of malignant neoplasm of breast: Secondary | ICD-10-CM

## 2011-02-13 MED ORDER — SODIUM CHLORIDE 0.9 % IV SOLN
Freq: Once | INTRAVENOUS | Status: AC
  Administered 2011-02-13: 10:00:00 via INTRAVENOUS

## 2011-02-13 MED ORDER — ZOLEDRONIC ACID 4 MG/100ML IV SOLN
4.0000 mg | Freq: Once | INTRAVENOUS | Status: AC
  Administered 2011-02-13: 4 mg via INTRAVENOUS
  Filled 2011-02-13: qty 100

## 2011-02-27 ENCOUNTER — Other Ambulatory Visit: Payer: Self-pay | Admitting: Oncology

## 2011-03-09 ENCOUNTER — Other Ambulatory Visit: Payer: Self-pay | Admitting: *Deleted

## 2011-03-09 MED ORDER — HYDROCODONE-ACETAMINOPHEN 5-325 MG PO TABS: 1.0000 | ORAL_TABLET | Freq: Three times a day (TID) | ORAL | Status: AC | PRN

## 2011-04-13 ENCOUNTER — Ambulatory Visit (INDEPENDENT_AMBULATORY_CARE_PROVIDER_SITE_OTHER): Payer: Federal, State, Local not specified - PPO | Admitting: Surgery

## 2011-04-13 ENCOUNTER — Encounter (INDEPENDENT_AMBULATORY_CARE_PROVIDER_SITE_OTHER): Payer: Self-pay | Admitting: Surgery

## 2011-04-13 VITALS — BP 128/86 | HR 64 | Temp 98.1°F | Resp 12 | Ht 64.0 in | Wt 178.2 lb

## 2011-04-13 DIAGNOSIS — Z853 Personal history of malignant neoplasm of breast: Secondary | ICD-10-CM

## 2011-04-13 NOTE — Progress Notes (Signed)
NAME: Brooke [name]       DOB: 12/13/1950           DATE: 04/13/2011       MRN: [id number]   Brooke [name] is a 61 y.o.[name] Kitchenfemale who presents for routine followup of her Left breast cancer diagnosed in 2011 and treated with neoadjuvant chemo, MRM with right prophylactic TM, and radiation,now on anti-estrogen. She has no problems or concerns on either side.  PFSH: She has had no significant changes since the last visit here.  ROS: There have been no significant changes since the last visit here  EXAM: General: The patient is alert, oriented, generally healty appearing, NAD. Mood and affect are normal.  Breasts:  S/P bilateral mastectomy no evidence of local recurrence  Lymphatics: She has no axillary or supraclavicular adenopathy on either side.  Extremities: Full ROM of the surgical side with no lymphedema noted.  Data Reviewed: Notes from Dr Darnelle Catalan.  Impression: Doing well, with no evidence of recurrent cancer or new cancer  Plan: RTC PRN. Reviewed what to watch for in terms of local recurrence

## 2011-04-13 NOTE — Patient Instructions (Signed)
We will see you again on an as needed basis. Please call the office at [phone] if you have any questions or concerns. Thank you for allowing us to take care of you.  

## 2011-04-23 ENCOUNTER — Encounter: Payer: Self-pay | Admitting: *Deleted

## 2011-04-24 ENCOUNTER — Telehealth: Payer: Self-pay | Admitting: *Deleted

## 2011-04-24 ENCOUNTER — Ambulatory Visit (HOSPITAL_BASED_OUTPATIENT_CLINIC_OR_DEPARTMENT_OTHER): Payer: Federal, State, Local not specified - PPO | Admitting: Oncology

## 2011-04-24 VITALS — BP 117/75 | HR 82 | Temp 98.3°F | Ht 64.0 in | Wt 178.5 lb

## 2011-04-24 DIAGNOSIS — C50919 Malignant neoplasm of unspecified site of unspecified female breast: Secondary | ICD-10-CM

## 2011-04-24 DIAGNOSIS — Z853 Personal history of malignant neoplasm of breast: Secondary | ICD-10-CM

## 2011-04-24 DIAGNOSIS — Z170 Estrogen receptor positive status [ER+]: Secondary | ICD-10-CM

## 2011-04-24 DIAGNOSIS — Z79811 Long term (current) use of aromatase inhibitors: Secondary | ICD-10-CM

## 2011-04-24 NOTE — Telephone Encounter (Signed)
per orders placed patient on 04-24-2011 at 9:30am

## 2011-04-24 NOTE — Progress Notes (Signed)
ID: Brooke [name]   DOB: 1950-09-19  MR#: [id number]  NWG#:[id number]  HISTORY OF PRESENT ILLNESS: Brooke [name] had screening mammography in August 04, 2007 which showed no specific mammographic evidence of malignancy, but Brooke [name] reported a left breast lump.  Accordingly, Brooke [name] was brought back on August 11, 2007 for mammography and ultrasonography.  There was indeed an obscured mass in the upper left breast which by ultrasound appeared to be a simple cyst.    Screening mammogram on August 04, 2008 showed in addition to dense breasts, again a possible mass in the left breast.  Brooke [name] had diagnostic mammography August 06, 2008 and additional views in addition to very dense breasts did not show any persistent mass or distortion.  Furthermore, Dr. Manson Passey was not able to palpate any abnormality in the lateral portion of the left breast.  Ultrasound showed normal appearing fibroglandular tissue in the area in question.    More recently, about September, the patient felt again something like a lump in the left breast and Brooke [name] had some pain associated with this.  This was initially felt simply to be mastalgia and was treated as such, but as it persisted, on December 23rd, the patient had left diagnostic mammography and ultrasonography.  There was a focally dense area in the left breast with very minimal distortion corresponding to the area of palpable abnormality. The ultrasound showed an irregular ill-defined, hypoechoic area with distal shadowing, measuring approximately 1 cm corresponding to the patient's palpable abnormality.    With this information, the patient was brought back for ultrasound-guided core biopsy and this was performed December 28th.  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  There was no evidence of angiolymphatic invasion in the material submitted which measured 1.5 cm maximally.  In addition to the mass in the breast, a lymph node in the left axilla  was biopsied and was likewise positive.  The tumor was ER positive at 89%, PR positive at 34% with an elevated MIB-1 at 44% and Brooke [name]-2 negative with a ratio of 1.13.    INTERVAL HISTORY: Brooke [name] returns today with Brooke [name] fail for followup of Brooke [name] breast cancer. Brooke [name] had noted a lump in Brooke [name] right axilla and became concerned.  REVIEW OF SYSTEMS: Review review of systems is otherwise entirely stable. Brooke [name] has mild to moderate hot flashes, which have not changed. Brooke [name] is not concerned about vaginal dryness issues. Brooke [name] had a high-risk HPV documented by Dr. [name], but the Pap was otherwise okay, and Brooke [name] will be followed with yearly Pap smears for now.  PAST MEDICAL HISTORY: Past Medical History  Diagnosis Date  . Hx Breast Cancer, IDC, Stahe III, receptor + 59/28/2012    left breast  1. Remote migraines, currently inactive.   2. History of prior diagnosis of fibromyalgia made at [facility] and currently not active (Brooke [name] was treated with trimethoprim and nortriptyline for one year for this).    3. History of recurrent UTIs.   4. History of benign left breast cyst aspirated in 2004 at [facility] under Dr. Christain Sacramento.   5. History of tonsillectomy and adenoidectomy.   6. History of right LASEK surgery. 7. History of bilateral reduction mammoplasties in 1994.   8. History of mitral valve prolapse diagnosed in 1984.  PAST SURGICAL HISTORY: Past Surgical History  Procedure Date  . Mastectomy 2011    left breast    FAMILY HISTORY Family History  Problem Relation Age of Onset  .  Cancer Father     lung  . Heart disease Maternal Grandmother   . Heart disease Maternal Grandfather   [name] died at the age of 21 from lung cancer.  He had been a remote smoker and had been in [employer] with a question of possible asbestos exposure.  [name] died at the age of 70 in the setting of Alzheimer's, coronary artery disease, diabetes and hypertension.  The  patient had two sisters.  One died from complications of diabetes.  The other one is alive with diabetes.    GYNECOLOGIC HISTORY: Brooke [name] is GX P1.  First pregnancy to term at age 61.  The patient's last period was in 2008.  Brooke [name] took hormone replacement for about 18 months until mid-2008.  Brooke [name] had no complications from that treatment.    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  They used to live in the [place] area. 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  Brooke [name] is now retired.  Brooke [name], [name], retired today.  He was a Scientific laboratory technician for 28 years.  Their son Molli [name], 24, is currently with the [employer] at the [employer] in [place].  [name].  Brooke [name] attends [employer].   ADVANCED DIRECTIVES: in place  HEALTH MAINTENANCE: History  Substance Use Topics  . Smoking status: Former Smoker    Quit date: 01/23/1984  . Smokeless tobacco: Not on file  . Alcohol Use: No     Colonoscopy: 2010  PAP: UTD/Miller  Bone density: 2011  Lipid panel: per Dr Earl Gala  Allergies  Allergen Reactions  . Sulfa Antibiotics Shortness Of Breath  . Codeine Nausea And Vomiting    Current Outpatient Prescriptions  Medication Sig Dispense Refill  . Calcium Citrate-Vitamin D (CITRACAL/VITAMIN D PO) Take by mouth daily.      [name] Kitchen gabapentin (NEURONTIN) 300 MG capsule Take 1 capsule (300 mg total) by mouth Nightly.  90 capsule  1  . letrozole (FEMARA) 2.5 MG tablet Take 2.5 mg by mouth daily.        [name] Kitchen LORazepam (ATIVAN) 0.5 MG tablet Take 0.5 mg by mouth 2 (two) times daily as needed.        . venlafaxine (EFFEXOR-XR) 37.5 MG 24 hr capsule Take 2 (two) capsules po daily  180 capsule  3  . Zoledronic Acid (RECLAST IV) Inject into the vein every 6 (six) months.        OBJECTIVE: Middle-aged white woman in no acute distress 0 Filed Vitals:   04/24/11 0923  BP: 117/75  Pulse: 82  Temp: 98.3 F (36.8 C)     Body mass index  is 30.64 kg/(m^2).    ECOG FS:  Sclerae unicteric Oropharynx clear No peripheral adenopathy and specifically careful examination of the entire right axilla shows no suspicious findings Lungs no rales or rhonchi Heart regular rate and rhythm Abd benign MSK no focal spinal tenderness, no peripheral edema Neuro: nonfocal Breasts: Status post bilateral mastectomies, with reconstruction. No evidence of local recurrence  LAB RESULTS: Lab Results  Component Value Date   WBC 5.2 12/12/2010   NEUTROABS 3.6 12/12/2010   HGB 13.2 12/12/2010   HCT 39.2 12/12/2010   MCV 89.7 12/12/2010   PLT 241 12/12/2010      Chemistry      Component Value Date/Time   NA 143 12/12/2010 1325   NA 143 12/12/2010 1325   NA  143 12/12/2010 1325   NA 143 12/12/2010 1325   NA 143 12/12/2010 1325   K 4.7 12/12/2010 1325   K 4.7 12/12/2010 1325   K 4.7 12/12/2010 1325   K 4.7 12/12/2010 1325   K 4.7 12/12/2010 1325   CL 105 12/12/2010 1325   CL 105 12/12/2010 1325   CL 105 12/12/2010 1325   CL 105 12/12/2010 1325   CL 105 12/12/2010 1325   CO2 27 12/12/2010 1325   CO2 27 12/12/2010 1325   CO2 27 12/12/2010 1325   CO2 27 12/12/2010 1325   CO2 27 12/12/2010 1325   BUN 14 12/12/2010 1325   BUN 14 12/12/2010 1325   BUN 14 12/12/2010 1325   BUN 14 12/12/2010 1325   BUN 14 12/12/2010 1325   CREATININE 0.96 12/12/2010 1325   CREATININE 0.96 12/12/2010 1325   CREATININE 0.96 12/12/2010 1325   CREATININE 0.96 12/12/2010 1325   CREATININE 0.96 12/12/2010 1325      Component Value Date/Time   CALCIUM 10.1 12/12/2010 1325   CALCIUM 10.1 12/12/2010 1325   CALCIUM 10.1 12/12/2010 1325   CALCIUM 10.1 12/12/2010 1325   CALCIUM 10.1 12/12/2010 1325   ALKPHOS 54 12/12/2010 1325   ALKPHOS 54 12/12/2010 1325   ALKPHOS 54 12/12/2010 1325   ALKPHOS 54 12/12/2010 1325   ALKPHOS 54 12/12/2010 1325   AST 19 12/12/2010 1325   AST 19 12/12/2010 1325   AST 19 12/12/2010 1325   AST 19 12/12/2010 1325   AST 19  12/12/2010 1325   ALT 19 12/12/2010 1325   ALT 19 12/12/2010 1325   ALT 19 12/12/2010 1325   ALT 19 12/12/2010 1325   ALT 19 12/12/2010 1325   BILITOT 0.3 12/12/2010 1325   BILITOT 0.3 12/12/2010 1325   BILITOT 0.3 12/12/2010 1325   BILITOT 0.3 12/12/2010 1325   BILITOT 0.3 12/12/2010 1325       Lab Results  Component Value Date   LABCA2 20 12/12/2010   LABCA2 20 12/12/2010   LABCA2 20 12/12/2010   LABCA2 20 12/12/2010    No components found with this basename: LABCA125    No results found for this basename: INR:1;PROTIME:1 in the last 168 hours  Urinalysis No results found for this basename: colorurine, appearanceur, labspec, phurine, glucoseu, hgbur, bilirubinur, ketonesur, proteinur, urobilinogen, nitrite, leukocytesur    STUDIES: No new results found.  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 Brooke [name] had a residual 1.5 cm tumor, and  one of 8 sampled lymph nodes was positive. Margins were negative. Brooke [name] completed radiation in August of 2011 and started letrozole at that time.   PLAN: I was delighted this was a false alarm and was able to reassure Brooke [name]. Brooke [name] is ready scheduled for a staging CT scan in June, with visit and then zoledronic acid to follow. The plan continues to be to do letrozole and minimum of 5 years, possibly continued to 10 years. Brooke [name] knows to call for any problems that may develop before the next visit  Tamakia Porto C    04/24/2011

## 2011-05-28 ENCOUNTER — Other Ambulatory Visit: Payer: Federal, State, Local not specified - PPO | Admitting: Lab

## 2011-05-28 ENCOUNTER — Other Ambulatory Visit (HOSPITAL_COMMUNITY): Payer: Federal, State, Local not specified - PPO

## 2011-06-04 ENCOUNTER — Ambulatory Visit: Payer: Federal, State, Local not specified - PPO | Admitting: Oncology

## 2011-06-26 ENCOUNTER — Ambulatory Visit (HOSPITAL_COMMUNITY)
Admission: RE | Admit: 2011-06-26 | Discharge: 2011-06-26 | Disposition: A | Discharge status: Home / Self Care | Payer: Medicare Other | Source: Ambulatory Visit | Attending: Oncology | Admitting: Oncology

## 2011-06-26 ENCOUNTER — Other Ambulatory Visit: Payer: Self-pay | Admitting: *Deleted

## 2011-06-26 ENCOUNTER — Other Ambulatory Visit (HOSPITAL_BASED_OUTPATIENT_CLINIC_OR_DEPARTMENT_OTHER): Payer: Medicare Other | Admitting: Lab

## 2011-06-26 DIAGNOSIS — Z9010 Acquired absence of unspecified breast and nipple: Secondary | ICD-10-CM | POA: Insufficient documentation

## 2011-06-26 DIAGNOSIS — Z853 Personal history of malignant neoplasm of breast: Secondary | ICD-10-CM

## 2011-06-26 DIAGNOSIS — C50919 Malignant neoplasm of unspecified site of unspecified female breast: Secondary | ICD-10-CM

## 2011-06-26 DIAGNOSIS — D739 Disease of spleen, unspecified: Secondary | ICD-10-CM | POA: Insufficient documentation

## 2011-06-26 DIAGNOSIS — C50119 Malignant neoplasm of central portion of unspecified female breast: Secondary | ICD-10-CM

## 2011-06-26 DIAGNOSIS — Z923 Personal history of irradiation: Secondary | ICD-10-CM | POA: Insufficient documentation

## 2011-06-26 DIAGNOSIS — Z79899 Other long term (current) drug therapy: Secondary | ICD-10-CM | POA: Insufficient documentation

## 2011-06-26 LAB — BASIC METABOLIC PANEL - CANCER CENTER ONLY
BUN, Bld: 12 mg/dL (ref 7–22)
Potassium: 4.6 mEq/L (ref 3.3–4.7)
Sodium: 142 mEq/L (ref 128–145)

## 2011-06-26 MED ORDER — IOHEXOL 300 MG/ML  SOLN
100.0000 mL | Freq: Once | INTRAMUSCULAR | Status: AC | PRN
  Administered 2011-06-26: 100 mL via INTRAVENOUS

## 2011-06-26 MED ORDER — LORAZEPAM 0.5 MG PO TABS: 0.5000 mg | ORAL_TABLET | Freq: Two times a day (BID) | ORAL | Status: DC | PRN

## 2011-06-28 ENCOUNTER — Other Ambulatory Visit: Payer: Self-pay | Admitting: Oncology

## 2011-07-03 ENCOUNTER — Ambulatory Visit (HOSPITAL_BASED_OUTPATIENT_CLINIC_OR_DEPARTMENT_OTHER): Payer: Medicare Other | Admitting: Oncology

## 2011-07-03 ENCOUNTER — Telehealth: Payer: Self-pay | Admitting: Oncology

## 2011-07-03 VITALS — BP 115/81 | HR 75 | Temp 98.5°F | Ht 64.0 in | Wt 178.7 lb

## 2011-07-03 DIAGNOSIS — C50919 Malignant neoplasm of unspecified site of unspecified female breast: Secondary | ICD-10-CM | POA: Insufficient documentation

## 2011-07-03 DIAGNOSIS — Z170 Estrogen receptor positive status [ER+]: Secondary | ICD-10-CM

## 2011-07-03 MED ORDER — VALACYCLOVIR HCL 1 G PO TABS: 500.0000 mg | ORAL_TABLET | Freq: Every day | ORAL | Status: DC

## 2011-07-03 NOTE — Telephone Encounter (Signed)
gve the pt her dec,july 2014 appt calendar. Pt is aware her tx will be added. Sent michelle a staff message

## 2011-07-03 NOTE — Progress Notes (Signed)
ID: Brooke [name]   DOB: November 09, 1950  MR#: [id number]  YQM#:[id number]  HISTORY OF PRESENT ILLNESS: Brooke [name] had screening mammography in August 04, 2007 which showed no specific mammographic evidence of malignancy, but she reported a left breast lump.  Accordingly, she was brought back on August 11, 2007 for mammography and ultrasonography.  There was indeed an obscured mass in the upper left breast which by ultrasound appeared to be a simple cyst.    Screening mammogram on August 04, 2008 showed in addition to dense breasts, again a possible mass in the left breast.  She had diagnostic mammography August 06, 2008 and additional views in addition to very dense breasts did not show any persistent mass or distortion.  Furthermore, Dr. Manson Passey was not able to palpate any abnormality in the lateral portion of the left breast.  Ultrasound showed normal appearing fibroglandular tissue in the area in question.    More recently, about September, the patient felt again something like a lump in the left breast and she had some pain associated with this.  This was initially felt simply to be mastalgia and was treated as such, but as it persisted, on December 23rd, the patient had left diagnostic mammography and ultrasonography.  There was a focally dense area in the left breast with very minimal distortion corresponding to the area of palpable abnormality. The ultrasound showed an irregular ill-defined, hypoechoic area with distal shadowing, measuring approximately 1 cm corresponding to the patient's palpable abnormality.    With this information, the patient was brought back for ultrasound-guided core biopsy and this was performed December 28th.  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  There was no evidence of angiolymphatic invasion in the material submitted which measured 1.5 cm maximally.  In addition to the mass in the breast, a lymph node in the left axilla  was biopsied and was likewise positive.  The tumor was ER positive at 89%, PR positive at 34% with an elevated MIB-1 at 44% and Her-2 negative with a ratio of 1.13.  Her subsequent history is as detailed below.  INTERVAL HISTORY: Brooke Bible returns today with her husband Brooke [name] for followup of her breast cancer. The interval history is generally unremarkable. Her son Brooke [name] was visiting with his college roommate. They took their daughter Brooke [name] to [place] where she will be a Printmaker in college starting in the fall. They had some out-of-town visitors as well. All of this has kept them from exercising regularly, but they are planning to go back to her daily walk "soon".  REVIEW OF SYSTEMS: She had symptoms from a urinary tract infection which she self treated successfully. She denies vaginal dryness issues. She still has hot flashes, they are not particularly bothersome," there just what they are". She does not want to take any medication for that. Otherwise she was having a little bit of bilateral elbow discomfort, after spending a day on her hands and knees refinishing her floor. A detailed review of systems was otherwise noncontributory  PAST MEDICAL HISTORY: Past Medical History  Diagnosis Date  . Hx Breast Cancer, IDC, Stahe III, receptor + 10/20/2010    left breast  1. Remote migraines, currently inactive.   2. History of prior diagnosis of fibromyalgia made at [facility] and currently not active (she was treated with trimethoprim and nortriptyline for one year for this).    3. History of recurrent UTIs.   4. History of benign left breast cyst  aspirated in 2004 at [facility] under Dr. Christain Sacramento.   5. History of tonsillectomy and adenoidectomy.   6. History of right LASEK surgery. 7. History of bilateral reduction mammoplasties in 1994.   8. History of mitral valve prolapse diagnosed in 1984.  PAST SURGICAL HISTORY: Past Surgical History  Procedure Date  . Mastectomy 2011      left breast    FAMILY HISTORY Family History  Problem Relation Age of Onset  . Cancer Father     lung  . Heart disease Maternal Grandmother   . Heart disease Maternal Grandfather   [name] died at the age of 37 from lung cancer.  He had been a remote smoker and had been in [employer] with a question of possible asbestos exposure.  [name] died at the age of 55 in the setting of Alzheimer's, coronary artery disease, diabetes and hypertension.  The patient had two sisters.  One died from complications of diabetes.  The other one is alive with diabetes.    GYNECOLOGIC HISTORY: She is GX P1.  First pregnancy to term at age 59.  The patient's last period was in 2008.  She took hormone replacement for about 18 months until mid-2008.  She had no complications from that treatment.    SOCIAL HISTORY: She used to be a Associate Professor for [employer].  They used to live in the [place] area. 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  Her husband, Brooke [name], retired October 2012.  He was a Scientific laboratory technician for 28 years.  Their son Brooke [name], is currently with the [employer] at the [employer] in [place]. [name].  She attends [employer].   ADVANCED DIRECTIVES: in place  HEALTH MAINTENANCE: History  Substance Use Topics  . Smoking status: Former Smoker    Quit date: 01/23/1984  . Smokeless tobacco: Not on file  . Alcohol Use: No     Colonoscopy: 2010  PAP: UTD/Miller  Bone density: 03/2009  Lipid panel: per Dr Valentina Lucks  Allergies  Allergen Reactions  . Sulfa Antibiotics Shortness Of Breath  . Codeine Nausea And Vomiting    Current Outpatient Prescriptions  Medication Sig Dispense Refill  . Calcium Citrate-Vitamin D (CITRACAL/VITAMIN D PO) Take by mouth daily.      [name] Kitchen gabapentin (NEURONTIN) 300 MG capsule Take 1 capsule (300 mg total) by mouth Nightly.  90 capsule  1  . letrozole  (FEMARA) 2.5 MG tablet Take 2.5 mg by mouth daily.        [name] Kitchen LORazepam (ATIVAN) 0.5 MG tablet Take 1 tablet (0.5 mg total) by mouth 2 (two) times daily as needed for anxiety.  60 tablet  5  . venlafaxine (EFFEXOR-XR) 37.5 MG 24 hr capsule Take 2 (two) capsules po daily  180 capsule  3  . Zoledronic Acid (RECLAST IV) Inject into the vein every 6 (six) months.        OBJECTIVE: Middle-aged white woman in no acute distress  Filed Vitals:   07/03/11 0858  BP: 115/81  Pulse: 75  Temp: 98.5 F (36.9 C)     Body mass index is 30.67 kg/(m^2).    ECOG FS: 0  Sclerae unicteric Oropharynx clear No peripheral adenopathy and specifically careful examination of botht axillae shows no suspicious findings Lungs no rales or rhonchi Heart regular rate and rhythm Abd benign MSK no focal spinal tenderness, no peripheral edema Neuro:  nonfocal Breasts: Status post bilateral mastectomies. No evidence of local recurrence  LAB RESULTS: Lab Results  Component Value Date   WBC 5.2 12/12/2010   NEUTROABS 3.6 12/12/2010   HGB 13.2 12/12/2010   HCT 39.2 12/12/2010   MCV 89.7 12/12/2010   PLT 241 12/12/2010      Chemistry      Component Value Date/Time   NA 142 06/26/2011 0728   NA 143 12/12/2010 1325   NA 143 12/12/2010 1325   NA 143 12/12/2010 1325   NA 143 12/12/2010 1325   NA 143 12/12/2010 1325   K 4.6 06/26/2011 0728   K 4.7 12/12/2010 1325   K 4.7 12/12/2010 1325   K 4.7 12/12/2010 1325   K 4.7 12/12/2010 1325   K 4.7 12/12/2010 1325   CL 102 06/26/2011 0728   CL 105 12/12/2010 1325   CL 105 12/12/2010 1325   CL 105 12/12/2010 1325   CL 105 12/12/2010 1325   CL 105 12/12/2010 1325   CO2 30 06/26/2011 0728   CO2 27 12/12/2010 1325   CO2 27 12/12/2010 1325   CO2 27 12/12/2010 1325   CO2 27 12/12/2010 1325   CO2 27 12/12/2010 1325   BUN 12 06/26/2011 0728   BUN 14 12/12/2010 1325   BUN 14 12/12/2010 1325   BUN 14 12/12/2010 1325   BUN 14 12/12/2010 1325   BUN 14 12/12/2010 1325    CREATININE 1.1 06/26/2011 0728   CREATININE 0.96 12/12/2010 1325   CREATININE 0.96 12/12/2010 1325   CREATININE 0.96 12/12/2010 1325   CREATININE 0.96 12/12/2010 1325   CREATININE 0.96 12/12/2010 1325      Component Value Date/Time   CALCIUM 9.1 06/26/2011 0728   CALCIUM 10.1 12/12/2010 1325   CALCIUM 10.1 12/12/2010 1325   CALCIUM 10.1 12/12/2010 1325   CALCIUM 10.1 12/12/2010 1325   CALCIUM 10.1 12/12/2010 1325   ALKPHOS 54 12/12/2010 1325   ALKPHOS 54 12/12/2010 1325   ALKPHOS 54 12/12/2010 1325   ALKPHOS 54 12/12/2010 1325   ALKPHOS 54 12/12/2010 1325   AST 19 12/12/2010 1325   AST 19 12/12/2010 1325   AST 19 12/12/2010 1325   AST 19 12/12/2010 1325   AST 19 12/12/2010 1325   ALT 19 12/12/2010 1325   ALT 19 12/12/2010 1325   ALT 19 12/12/2010 1325   ALT 19 12/12/2010 1325   ALT 19 12/12/2010 1325   BILITOT 0.3 12/12/2010 1325   BILITOT 0.3 12/12/2010 1325   BILITOT 0.3 12/12/2010 1325   BILITOT 0.3 12/12/2010 1325   BILITOT 0.3 12/12/2010 1325       Lab Results  Component Value Date   LABCA2 20 12/12/2010   LABCA2 20 12/12/2010   LABCA2 20 12/12/2010   LABCA2 20 12/12/2010    No components found with this basename: LABCA125    No results found for this basename: INR:1;PROTIME:1 in the last 168 hours  Urinalysis No results found for this basename: colorurine,  appearanceur,  labspec,  phurine,  glucoseu,  hgbur,  bilirubinur,  ketonesur,  proteinur,  urobilinogen,  nitrite,  leukocytesur    STUDIES: *RADIOLOGY REPORT*  Clinical Data: Left-sided breast cancer. Ongoing oral  chemotherapy. Radiation therapy completed 2011. No complaints.  Bilateral mastectomies.  CT CHEST WITH CONTRAST  Technique: Multidetector CT imaging of the chest was performed  following the standard protocol during bolus administration of  intravenous contrast.  Contrast: OMNIPAQUE IOHEXOL 300 MG/ML SOLN  Comparison: Plain films of 10/19/2010. PET  and chest CT of  02/01/2009.    Findings: Lung windows demonstrate minimal likely post infectious  or inflammatory interstitial thickening in the left apex on images  10 and 11.  Soft tissue windows demonstrate bilateral mastectomy. Left  axillary nodal dissection. Resolved left axillary adenopathy. No  subpectoral adenopathy. No supraclavicular adenopathy.  Normal heart size without pericardial or pleural effusion. No  central pulmonary embolism, on this non-dedicated study. No  mediastinal or hilar adenopathy. No internal mammary adenopathy.  Limited abdominal imaging demonstrates subcapsular low density  splenic lesion which measures 3.0 cm on image 56. Enlarged from  1.2 cm on the prior. Fluid density. Normal adrenal glands. Common  duct upper normal to minimally dilated, 8 mm. Unchanged. No  intrahepatic ductal dilatation. No acute osseous abnormality.  IMPRESSION:  1. Interval resolution of left axillary adenopathy. No acute  process or evidence of metastatic disease.  2. Interval enlargement of a well circumscribed low density splenic  lesion. Most likely a cyst or lymphangioma.  3. Borderline to mild common duct dilatation for age. Felt to be  similar back to 2011. Favored to be within normal variation. If  there are upper abdominal symptoms, bilirubin level correlation  should be considered.  Original Report Authenticated By: Consuello Bossier, M.D.   ASSESSMENT: 61 y.o. [place] woman status post Left breast and Left axillary lymph node biopsy Dec 2010 for a cT3 pN1 (stage IIIA) invasive ductal carcinoma, grade 2, estrogen and progesterone receptor positive, HER-2 negative, with an MIB-1 of 44%; treated neoadjuvantly with dose dense cyclophosphamide and doxorubicin x4 followed by weekly paclitaxel x7 followed by bilateral mastectomies May 2011. She had a residual 1.5 cm tumor, and  one of 8 sampled lymph nodes was positive. Margins were negative. She completed radiation in August of 2011 and started letrozole  at that time.   PLAN: She continues to do very well, now 2 years out from her definitive surgery. There is no evidence of disease recurrence. The plan is to continue letrozole to a total of 5 years and then reassess. She will continue to see [place] on a every 6 month basis, and she will receive zoledronic acid July 22. We will continue that on a yearly basis until she is off the letrozole. She will be due for a repeat bone density in March of 2014. She will see [place] again in January of 2014. She knows to call for any problems that may develop before the next visit.  Analyse Angst C    07/03/2011

## 2011-07-31 ENCOUNTER — Telehealth: Payer: Self-pay | Admitting: *Deleted

## 2011-07-31 NOTE — Telephone Encounter (Signed)
Pt called to state concern due to ongoing left forearm pain.  She states it was present at last MD visit and has continued.  Per inquiry [name] states discomfort is noted in large outside muscle of the forearm. Pain seems more noticed when waking in am as well as with lifting of pots or flossing of teeth.  She does not notice it as much as when she uses the whole arm for strength as in her exercises with pilates.  No change in color or temperature or size.  [name] did inquire about results of CT done in early June " if it showed anything"  Results reviewed with pt.  This note will be given to MD for recommendation.

## 2011-08-07 ENCOUNTER — Telehealth: Payer: Self-pay | Admitting: *Deleted

## 2011-08-07 NOTE — Telephone Encounter (Signed)
Pt called with update relating to forearm pain and use of aleve and compression -  Per call Pat states overall no improvement.  This note will be given to MD for review and recommendation.

## 2011-08-08 ENCOUNTER — Telehealth: Payer: Self-pay | Admitting: *Deleted

## 2011-08-08 NOTE — Telephone Encounter (Signed)
Per MD review recommended for pt to be seen by Dr Norlene Campbell for forearm pain.  Called to pt's home and received answering machine- message left to return call per above.

## 2011-08-09 ENCOUNTER — Other Ambulatory Visit: Payer: Self-pay | Admitting: *Deleted

## 2011-08-09 ENCOUNTER — Telehealth: Payer: Self-pay | Admitting: Oncology

## 2011-08-09 ENCOUNTER — Telehealth: Payer: Self-pay | Admitting: *Deleted

## 2011-08-09 DIAGNOSIS — M79603 Pain in arm, unspecified: Secondary | ICD-10-CM

## 2011-08-09 MED ORDER — LETROZOLE 2.5 MG PO TABS: 2.5000 mg | ORAL_TABLET | Freq: Every day | ORAL | Status: DC

## 2011-08-09 NOTE — Telephone Encounter (Signed)
S/w the pt and she is aware of her appt with dr whitfield the orthropedic surgeon

## 2011-08-09 NOTE — Telephone Encounter (Signed)
Pt returned call regarding recommendation for ongoing arm discomfort.  This RN discussed with pt recommendation to see an orthopedist for appropriate follow up. Pt has not seen an ortho previously - and per MD pt will be referred to Dr Norlene Campbell.  Onc tx schedule sent to scheduling.

## 2011-08-13 ENCOUNTER — Ambulatory Visit (HOSPITAL_BASED_OUTPATIENT_CLINIC_OR_DEPARTMENT_OTHER): Payer: Medicare Other

## 2011-08-13 VITALS — BP 119/73 | HR 92 | Temp 98.2°F

## 2011-08-13 DIAGNOSIS — M899 Disorder of bone, unspecified: Secondary | ICD-10-CM

## 2011-08-13 DIAGNOSIS — Z853 Personal history of malignant neoplasm of breast: Secondary | ICD-10-CM

## 2011-08-13 MED ORDER — ZOLEDRONIC ACID 4 MG/100ML IV SOLN
4.0000 mg | Freq: Once | INTRAVENOUS | Status: AC
  Administered 2011-08-13: 4 mg via INTRAVENOUS
  Filled 2011-08-13: qty 100

## 2011-08-13 MED ORDER — SODIUM CHLORIDE 0.9 % IV SOLN
Freq: Once | INTRAVENOUS | Status: AC
  Administered 2011-08-13: 10:00:00 via INTRAVENOUS

## 2011-08-13 NOTE — Patient Instructions (Addendum)
 ZOLEDRONIC ACID (ZOE le dron ik AS id) lowers the amount of calcium loss from bone. It is used to treat too much calcium in your blood from cancer. It is also used to prevent complications of cancer that has spread to the bone. This medicine may be used for other purposes; ask your health care provider or pharmacist if you have questions. What should I tell my health care provider before I take this medicine? They need to know if you have any of these conditions: -aspirin-sensitive asthma -dental disease -kidney disease -an unusual or allergic reaction to zoledronic acid, other medicines, foods, dyes, or preservatives -pregnant or trying to get pregnant -breast-feeding How should I use this medicine? This medicine is for infusion into a vein. It is given by a health care professional in a hospital or clinic setting. Talk to your pediatrician regarding the use of this medicine in children. Special care may be needed. Overdosage: If you think you have taken too much of this medicine contact a poison control center or emergency room at once. NOTE: This medicine is only for you. Do not share this medicine with others. What if I miss a dose? It is important not to miss your dose. Call your doctor or health care professional if you are unable to keep an appointment. What may interact with this medicine? -certain antibiotics given by injection -NSAIDs, medicines for pain and inflammation, like ibuprofen or naproxen -some diuretics like bumetanide, furosemide -teriparatide -thalidomide This list may not describe all possible interactions. Give your health care provider a list of all the medicines, herbs, non-prescription drugs, or dietary supplements you use. Also tell them if you smoke, drink alcohol, or use illegal drugs. Some items may interact with your medicine. What should I watch for while using this medicine? Visit your doctor or health care professional for regular checkups. It may be  some time before you see the benefit from this medicine. Do not stop taking your medicine unless your doctor tells you to. Your doctor may order blood tests or other tests to see how you are doing. Women should inform their doctor if they wish to become pregnant or think they might be pregnant. There is a potential for serious side effects to an unborn child. Talk to your health care professional or pharmacist for more information. You should make sure that you get enough calcium and vitamin D while you are taking this medicine. Discuss the foods you eat and the vitamins you take with your health care professional. Some people who take this medicine have severe bone, joint, and/or muscle pain. This medicine may also increase your risk for a broken thigh bone. Tell your doctor right away if you have pain in your upper leg or groin. Tell your doctor if you have any pain that does not go away or that gets worse. What side effects may I notice from receiving this medicine? Side effects that you should report to your doctor or health care professional as soon as possible: -allergic reactions like skin rash, itching or hives, swelling of the face, lips, or tongue -anxiety, confusion, or depression -breathing problems -changes in vision -feeling faint or lightheaded, falls -jaw burning, cramping, pain -muscle cramps, stiffness, or weakness -trouble passing urine or change in the amount of urine Side effects that usually do not require medical attention (report to your doctor or health care professional if they continue or are bothersome): -bone, joint, or muscle pain -fever -hair loss -irritation at site where injected -  loss of appetite -nausea, vomiting -stomach upset -tired This list may not describe all possible side effects. Call your doctor for medical advice about side effects. You may report side effects to FDA at 1-800-FDA-1088. Where should I keep my medicine? This drug is given in a  hospital or clinic and will not be stored at home. NOTE: This sheet is a summary. It may not cover all possible information. If you have questions about this medicine, talk to your doctor, pharmacist, or health care provider.  2012, Elsevier/Gold Standard. (07/07/2010 9:06:58 AM) 

## 2011-08-14 ENCOUNTER — Other Ambulatory Visit: Payer: Self-pay

## 2011-08-14 DIAGNOSIS — C50919 Malignant neoplasm of unspecified site of unspecified female breast: Secondary | ICD-10-CM

## 2011-08-14 MED ORDER — LETROZOLE 2.5 MG PO TABS: 2.5000 mg | ORAL_TABLET | Freq: Every day | ORAL | Status: DC

## 2011-08-31 ENCOUNTER — Other Ambulatory Visit: Payer: Self-pay | Admitting: Oncology

## 2011-10-03 ENCOUNTER — Ambulatory Visit
Admission: RE | Admit: 2011-10-03 | Discharge: 2011-10-03 | Disposition: A | Discharge status: Home / Self Care | Payer: Medicare Other | Source: Ambulatory Visit | Attending: Surgery | Admitting: Surgery

## 2011-10-03 ENCOUNTER — Ambulatory Visit (INDEPENDENT_AMBULATORY_CARE_PROVIDER_SITE_OTHER): Payer: Medicare Other | Admitting: Surgery

## 2011-10-03 ENCOUNTER — Encounter (INDEPENDENT_AMBULATORY_CARE_PROVIDER_SITE_OTHER): Payer: Self-pay | Admitting: Surgery

## 2011-10-03 VITALS — BP 150/96 | HR 97 | Temp 98.0°F | Ht 64.0 in | Wt 179.4 lb

## 2011-10-03 DIAGNOSIS — R229 Localized swelling, mass and lump, unspecified: Secondary | ICD-10-CM

## 2011-10-03 DIAGNOSIS — R2230 Localized swelling, mass and lump, unspecified upper limb: Secondary | ICD-10-CM

## 2011-10-03 NOTE — Progress Notes (Signed)
Chief complaint: Nodule near right mastectomy scar.  History of present illness: This patient underwent a left mastectomy for breast cancer and a right prophylactic mastectomy. She's been doing well in terms of no evidence recurrent cancer. Recently she has developed a tender nodule in the right chest wall. It is above or mastectomy scar the lateral aspect and she is able to point out to me. It hasn't had any associated skin changes.  Past history, family history, and review of systems are well documented in the electronic medical record and not redictated here.  Exam: Vital signs:BP 150/96  Pulse 97  Temp 98 F (36.7 C) (Temporal)  Ht 5\' 4"  (1.626 m)  Wt 179 lb 6.4 oz (81.375 kg)  BMI 30.79 kg/m2  SpO2 98% Gen.: Patient alert awake oriented and healthy-appearing no acute distress.  Breasts: She is status post bilateral mastectomy. The right side shows no evidence of nodules in the chest wall. However, there is a tender nodular density at about the anterior axillary line at the very inferior margin of the axilla and above her mastectomy scar and somewhat lateral to it. This may represent a lymph node or some lipomatous changes. It feels benign. However, she does have a history of breast cancer and there could be some residual breast tissue present here.  Impression: Tender nodular density inferior aspect of the right axilla in a patient with a prior history of left breast cancer and right prophylactic mastectomy  Plan: I told patient that this was almost certainly benign tissue. However we can get an ultrasound just to confirm there is no irregular tissue or more solid mass present. This will be done later today. If negative I think she can just followup with routine exams as she has been doing.

## 2011-10-03 NOTE — Patient Instructions (Signed)
I think the area in the right lower armpit area is benign but we will get an ultrasound to be sure

## 2011-10-03 NOTE — Progress Notes (Signed)
Quick Note:  Let her know that the ultrasound was fine with no worrisome findings ______

## 2011-10-04 ENCOUNTER — Telehealth (INDEPENDENT_AMBULATORY_CARE_PROVIDER_SITE_OTHER): Payer: Self-pay | Admitting: General Surgery

## 2011-10-04 NOTE — Telephone Encounter (Signed)
Patient aware ultrasound showed normal tissue. Will call with any additional problems.

## 2011-10-04 NOTE — Telephone Encounter (Signed)
Message copied by Liliana Cline on Thu Oct 04, 2011  9:36 AM ------      Message from: Currie Paris      Created: Wed Oct 03, 2011  5:35 PM       Let her know that the ultrasound was fine with no worrisome findings

## 2011-12-14 ENCOUNTER — Other Ambulatory Visit: Payer: Self-pay | Admitting: *Deleted

## 2011-12-14 DIAGNOSIS — C50919 Malignant neoplasm of unspecified site of unspecified female breast: Secondary | ICD-10-CM

## 2011-12-14 MED ORDER — VALACYCLOVIR HCL 500 MG PO TABS: 500.0000 mg | ORAL_TABLET | Freq: Every day | ORAL | Status: DC

## 2011-12-14 NOTE — Telephone Encounter (Signed)
Patient called requesting ordering valtrex - a 90 day supply thru [facility].

## 2012-01-01 ENCOUNTER — Other Ambulatory Visit (HOSPITAL_BASED_OUTPATIENT_CLINIC_OR_DEPARTMENT_OTHER): Payer: Medicare Other | Admitting: Lab

## 2012-01-01 DIAGNOSIS — Z853 Personal history of malignant neoplasm of breast: Secondary | ICD-10-CM

## 2012-01-01 DIAGNOSIS — C50119 Malignant neoplasm of central portion of unspecified female breast: Secondary | ICD-10-CM

## 2012-01-01 LAB — BASIC METABOLIC PANEL (CC13)
BUN: 15 mg/dL (ref 7.0–26.0)
Chloride: 104 mEq/L (ref 98–107)
Glucose: 99 mg/dl (ref 70–99)
Potassium: 4.4 mEq/L (ref 3.5–5.1)
Sodium: 141 mEq/L (ref 136–145)

## 2012-01-07 ENCOUNTER — Telehealth: Payer: Self-pay | Admitting: *Deleted

## 2012-01-07 ENCOUNTER — Ambulatory Visit (HOSPITAL_BASED_OUTPATIENT_CLINIC_OR_DEPARTMENT_OTHER): Payer: Medicare Other | Admitting: Physician Assistant

## 2012-01-07 ENCOUNTER — Other Ambulatory Visit (HOSPITAL_BASED_OUTPATIENT_CLINIC_OR_DEPARTMENT_OTHER): Payer: Medicare Other | Admitting: Lab

## 2012-01-07 ENCOUNTER — Encounter: Payer: Self-pay | Admitting: Physician Assistant

## 2012-01-07 ENCOUNTER — Other Ambulatory Visit: Payer: Self-pay | Admitting: *Deleted

## 2012-01-07 VITALS — BP 124/78 | HR 87 | Temp 98.4°F | Resp 20 | Ht 64.0 in | Wt 179.4 lb

## 2012-01-07 DIAGNOSIS — Z853 Personal history of malignant neoplasm of breast: Secondary | ICD-10-CM

## 2012-01-07 DIAGNOSIS — Z170 Estrogen receptor positive status [ER+]: Secondary | ICD-10-CM

## 2012-01-07 DIAGNOSIS — C50919 Malignant neoplasm of unspecified site of unspecified female breast: Secondary | ICD-10-CM

## 2012-01-07 DIAGNOSIS — M899 Disorder of bone, unspecified: Secondary | ICD-10-CM

## 2012-01-07 DIAGNOSIS — C773 Secondary and unspecified malignant neoplasm of axilla and upper limb lymph nodes: Secondary | ICD-10-CM

## 2012-01-07 LAB — COMPREHENSIVE METABOLIC PANEL (CC13)
BUN: 14 mg/dL (ref 7.0–26.0)
CO2: 27 mEq/L (ref 22–29)
Calcium: 9.8 mg/dL (ref 8.4–10.4)
Chloride: 105 mEq/L (ref 98–107)
Creatinine: 1 mg/dL (ref 0.6–1.1)
Glucose: 89 mg/dl (ref 70–99)
Total Bilirubin: 0.28 mg/dL (ref 0.20–1.20)

## 2012-01-07 LAB — CBC WITH DIFFERENTIAL/PLATELET
Basophils Absolute: 0 10*3/uL (ref 0.0–0.1)
HCT: 40.1 % (ref 34.8–46.6)
HGB: 13.6 g/dL (ref 11.6–15.9)
LYMPH%: 30.6 % (ref 14.0–49.7)
MONO#: 0.5 10*3/uL (ref 0.1–0.9)
NEUT%: 56.8 % (ref 38.4–76.8)
Platelets: 274 10*3/uL (ref 145–400)
WBC: 5.7 10*3/uL (ref 3.9–10.3)
lymph#: 1.8 10*3/uL (ref 0.9–3.3)

## 2012-01-07 NOTE — Progress Notes (Signed)
ID: Brooke [name]   DOB: 11-19-1950  MR#: [id number]  [id number]#:[id number]  HISTORY OF PRESENT ILLNESS: Brooke [name] had screening mammography in August 04, 2007 which showed no specific mammographic evidence of malignancy, but she reported a left breast lump.  Accordingly, she was brought back on August 11, 2007 for mammography and ultrasonography.  There was indeed an obscured mass in the upper left breast which by ultrasound appeared to be a simple cyst.    Screening mammogram on August 04, 2008 showed in addition to dense breasts, again a possible mass in the left breast.  She had diagnostic mammography August 06, 2008 and additional views in addition to very dense breasts did not show any persistent mass or distortion.  Furthermore, Dr. Manson Passey was not able to palpate any abnormality in the lateral portion of the left breast.  Ultrasound showed normal appearing fibroglandular tissue in the area in question.    More recently, about September, the patient felt again something like a lump in the left breast and she had some pain associated with this.  This was initially felt simply to be mastalgia and was treated as such, but as it persisted, on December 23rd, the patient had left diagnostic mammography and ultrasonography.  There was a focally dense area in the left breast with very minimal distortion corresponding to the area of palpable abnormality. The ultrasound showed an irregular ill-defined, hypoechoic area with distal shadowing, measuring approximately 1 cm corresponding to the patient's palpable abnormality.    With this information, the patient was brought back for ultrasound-guided core biopsy and this was performed December 28th.  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  There was no evidence of angiolymphatic invasion in the material submitted which measured 1.5 cm maximally.  In addition to the mass in the breast, a lymph node in the left axilla  was biopsied and was likewise positive.  The tumor was ER positive at 89%, PR positive at 34% with an elevated MIB-1 at 44% and Her-2 negative with a ratio of 1.13.  Her subsequent history is as detailed below.  INTERVAL HISTORY: Brooke [name] returns today with her husband Brooke [name] for followup of her breast cancer. She continues on letrozole which she is tolerating well. She receives zoledronic acid for her history of osteopenia with, last given in July and also tolerated well. She denies any jaw pain.  Interval history is unremarkable.  Continues to exercise regularly, either with walking or with  Pilates.she still gets a little anxious at times, but otherwise "feels good".    REVIEW OF SYSTEMS: Brooke [name] denies any recent illnesses and has had no fevers or chills. She has occasional hot flashes, nothing that is particularly problematic. She continues on his gabapentin and Effexor which is helpful. She's had no recent cough, phlegm production, shortness of breath, or chest pain. She has occasional nausea, but no emesis, no recent change in bowel habits. She denies any abnormal headaches or dizziness. She's had no unusual myalgias, arthralgias, bony pain, or peripheral swelling.  A detailed review of systems is otherwise noncontributory.   PAST MEDICAL HISTORY: Past Medical History  Diagnosis Date  . Hx Breast Cancer, IDC, Stahe III, receptor + 10/20/2010    left breast  1. Remote migraines, currently inactive.   2. History of prior diagnosis of fibromyalgia made at [facility] and currently not active (she was treated with trimethoprim and nortriptyline for one year for this).    3. History  of recurrent UTIs.   4. History of benign left breast cyst aspirated in 2004 at [facility] under Dr. Christain Sacramento.   5. History of tonsillectomy and adenoidectomy.   6. History of right LASEK surgery. 7. History of bilateral reduction mammoplasties in 1994.   8. History of mitral valve prolapse  diagnosed in 1984.  PAST SURGICAL HISTORY: Past Surgical History  Procedure Date  . Mastectomy 2011    left breast    FAMILY HISTORY Family History  Problem Relation Age of Onset  . Cancer Father     lung  . Heart disease Maternal Grandmother   . Heart disease Maternal Grandfather   [name] died at the age of 64 from lung cancer.  He had been a remote smoker and had been in [employer] with a question of possible asbestos exposure.  [name] died at the age of 37 in the setting of Alzheimer's, coronary artery disease, diabetes and hypertension.  The patient had two sisters.  One died from complications of diabetes.  The other one is alive with diabetes.    GYNECOLOGIC HISTORY: She is GX P1.  First pregnancy to term at age 69.  The patient's last period was in 2008.  She took hormone replacement for about 18 months until mid-2008.  She had no complications from that treatment.    SOCIAL HISTORY: She used to be a Associate Professor for [employer].  They used to live in the [place] area. 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  Her husband, Brooke [name], retired October 2012.  He was a Scientific laboratory technician for 28 years.  Their son Brooke [name], is currently with the [employer] at the [employer] in [place]. [name].  She attends [employer].   ADVANCED DIRECTIVES: in place  HEALTH MAINTENANCE: History  Substance Use Topics  . Smoking status: Former Smoker    Quit date: 01/23/1984  . Smokeless tobacco: Not on file  . Alcohol Use: No     Colonoscopy: 2010  PAP: UTD/Miller  Bone density: 03/2009  Lipid panel: per Dr Valentina Lucks  Allergies  Allergen Reactions  . Sulfa Antibiotics Shortness Of Breath  . Codeine Nausea And Vomiting    Current Outpatient Prescriptions  Medication Sig Dispense Refill  . Calcium Citrate-Vitamin D (CITRACAL/VITAMIN D PO) Take by mouth daily.      [name] Kitchen gabapentin  (NEURONTIN) 300 MG capsule TAKE 1 CAPSULE BY MOUTH NIGHTLY  90 capsule  1  . letrozole (FEMARA) 2.5 MG tablet Take 1 tablet (2.5 mg total) by mouth daily.  90 tablet  3  . LORazepam (ATIVAN) 0.5 MG tablet Take 1 tablet (0.5 mg total) by mouth 2 (two) times daily as needed for anxiety.  60 tablet  5  . valACYclovir (VALTREX) 500 MG tablet Take 1 tablet (500 mg total) by mouth daily.  90 tablet  0  . venlafaxine (EFFEXOR-XR) 37.5 MG 24 hr capsule Take 2 (two) capsules po daily  180 capsule  3  . Zoledronic Acid (RECLAST IV) Inject into the vein every 6 (six) months.        OBJECTIVE: Middle-aged white woman in no acute distress  Filed Vitals:   01/07/12 0852  BP: 124/78  Pulse: 87  Temp: 98.4 F (36.9 C)  Resp: 20     Body mass index is 30.79 kg/(m^2).    ECOG FS: 0 Filed Weights   01/07/12 [phone]  Weight: 179 lb 6.4 oz (81.375 kg)    Sclerae unicteric Oropharynx clear No peripheral adenopathy and specifically careful examination of both axillae shows no suspicious findings Lungs no rales or rhonchi Heart regular rate and rhythm Abdomen soft, nontender, positive bowel sounds MSK no focal spinal tenderness, no peripheral edema Neuro: nonfocal, alert and oriented x3 Breasts: Status post bilateral mastectomies. No evidence of local recurrence  LAB RESULTS: Lab Results  Component Value Date   WBC 5.2 12/12/2010   NEUTROABS 3.6 12/12/2010   HGB 13.2 12/12/2010   HCT 39.2 12/12/2010   MCV 89.7 12/12/2010   PLT 241 12/12/2010      Chemistry      Component Value Date/Time   NA 141 01/01/2012 0846   NA 142 06/26/2011 0728   NA 143 12/12/2010 1325   NA 143 12/12/2010 1325   NA 143 12/12/2010 1325   NA 143 12/12/2010 1325   NA 143 12/12/2010 1325   K 4.4 01/01/2012 0846   K 4.6 06/26/2011 0728   K 4.7 12/12/2010 1325   K 4.7 12/12/2010 1325   K 4.7 12/12/2010 1325   K 4.7 12/12/2010 1325   K 4.7 12/12/2010 1325   CL 104 01/01/2012 0846   CL 102 06/26/2011 0728   CL 105  12/12/2010 1325   CL 105 12/12/2010 1325   CL 105 12/12/2010 1325   CL 105 12/12/2010 1325   CL 105 12/12/2010 1325   CO2 29 01/01/2012 0846   CO2 30 06/26/2011 0728   CO2 27 12/12/2010 1325   CO2 27 12/12/2010 1325   CO2 27 12/12/2010 1325   CO2 27 12/12/2010 1325   CO2 27 12/12/2010 1325   BUN 15.0 01/01/2012 0846   BUN 12 06/26/2011 0728   BUN 14 12/12/2010 1325   BUN 14 12/12/2010 1325   BUN 14 12/12/2010 1325   BUN 14 12/12/2010 1325   BUN 14 12/12/2010 1325   CREATININE 0.9 01/01/2012 0846   CREATININE 1.1 06/26/2011 0728   CREATININE 0.96 12/12/2010 1325   CREATININE 0.96 12/12/2010 1325   CREATININE 0.96 12/12/2010 1325   CREATININE 0.96 12/12/2010 1325   CREATININE 0.96 12/12/2010 1325      Component Value Date/Time   CALCIUM 10.3 01/01/2012 0846   CALCIUM 9.1 06/26/2011 0728   CALCIUM 10.1 12/12/2010 1325   CALCIUM 10.1 12/12/2010 1325   CALCIUM 10.1 12/12/2010 1325   CALCIUM 10.1 12/12/2010 1325   CALCIUM 10.1 12/12/2010 1325   ALKPHOS 54 12/12/2010 1325   ALKPHOS 54 12/12/2010 1325   ALKPHOS 54 12/12/2010 1325   ALKPHOS 54 12/12/2010 1325   ALKPHOS 54 12/12/2010 1325   AST 19 12/12/2010 1325   AST 19 12/12/2010 1325   AST 19 12/12/2010 1325   AST 19 12/12/2010 1325   AST 19 12/12/2010 1325   ALT 19 12/12/2010 1325   ALT 19 12/12/2010 1325   ALT 19 12/12/2010 1325   ALT 19 12/12/2010 1325   ALT 19 12/12/2010 1325   BILITOT 0.3 12/12/2010 1325   BILITOT 0.3 12/12/2010 1325   BILITOT 0.3 12/12/2010 1325   BILITOT 0.3 12/12/2010 1325   BILITOT 0.3 12/12/2010 1325       Lab Results  Component Value Date   LABCA2 20 12/12/2010   LABCA2 20 12/12/2010   LABCA2 20 12/12/2010   LABCA2 20 12/12/2010    STUDIES:  No results found.   ASSESSMENT: 61 y.o. [place] woman   (1)  status post Left breast and  Left axillary lymph node biopsy Dec 2010 for a cT3 pN1 (stage IIIA) invasive ductal carcinoma, grade 2, estrogen and progesterone receptor positive,  HER-2 negative, with an MIB-1 of 44%;   (2)  treated neoadjuvantly with dose dense cyclophosphamide and doxorubicin x4 followed by weekly paclitaxel x7 followed by bilateral mastectomies May 2011. She had a residual 1.5 cm tumor, and  one of 8 sampled lymph nodes was positive. Margins were negative.   (3)  She completed radiation in August of 2011 and started letrozole at that time.   PLAN:  Brooke [name] is doing very well with regards to her breast cancer, and will continue on the letrozole 2.5 mg daily as before. We'll continue to see her every 6 months, with zoledronic acid given yearly in July. The plan will be to continue this regimen for total of 5 years, until August of 2016.  Brooke [name] is due for her next bone density in March of 2014. She tells me she sees her gynecologist for routine followup in February and that she will order the bone density at that time. They will make sure we received a copy of the results.  Brooke [name] and Phil both voice understanding and agreement with this plan, and will call any changes or problems.   Brooke [name]    01/07/2012

## 2012-01-07 NOTE — Telephone Encounter (Signed)
patient could not stay to return to the lab patient called back and will be here at 1:30pm on 01-07-2012

## 2012-01-07 NOTE — Telephone Encounter (Signed)
Patient stated if the lab could not get her in five minutes she would have to come in another day scheduled the patient for 01-11-2012 at 8:30am

## 2012-01-08 ENCOUNTER — Other Ambulatory Visit: Payer: Self-pay | Admitting: Oncology

## 2012-01-08 DIAGNOSIS — C50919 Malignant neoplasm of unspecified site of unspecified female breast: Secondary | ICD-10-CM

## 2012-01-08 LAB — CANCER ANTIGEN 27.29: CA 27.29: 21 U/mL (ref 0–39)

## 2012-01-09 ENCOUNTER — Other Ambulatory Visit: Payer: Self-pay | Admitting: Oncology

## 2012-01-11 ENCOUNTER — Other Ambulatory Visit: Payer: Medicare Other

## 2012-01-14 ENCOUNTER — Other Ambulatory Visit: Payer: Self-pay | Admitting: *Deleted

## 2012-01-14 DIAGNOSIS — C50919 Malignant neoplasm of unspecified site of unspecified female breast: Secondary | ICD-10-CM

## 2012-01-14 MED ORDER — VENLAFAXINE HCL ER 37.5 MG PO CP24: 37.5000 mg | ORAL_CAPSULE | Freq: Two times a day (BID) | ORAL | Status: DC

## 2012-01-28 ENCOUNTER — Ambulatory Visit: Payer: Medicare Other | Admitting: Physician Assistant

## 2012-01-28 ENCOUNTER — Other Ambulatory Visit: Payer: Medicare Other | Admitting: Lab

## 2012-02-09 ENCOUNTER — Other Ambulatory Visit: Payer: Self-pay | Admitting: Oncology

## 2012-02-11 ENCOUNTER — Other Ambulatory Visit: Payer: Self-pay | Admitting: *Deleted

## 2012-02-11 DIAGNOSIS — C50919 Malignant neoplasm of unspecified site of unspecified female breast: Secondary | ICD-10-CM

## 2012-02-11 DIAGNOSIS — Z853 Personal history of malignant neoplasm of breast: Secondary | ICD-10-CM

## 2012-02-11 MED ORDER — GABAPENTIN 300 MG PO CAPS: 300.0000 mg | ORAL_CAPSULE | Freq: Every day | ORAL | Status: DC

## 2012-02-27 ENCOUNTER — Other Ambulatory Visit: Payer: Self-pay | Admitting: Gynecology

## 2012-02-27 ENCOUNTER — Other Ambulatory Visit: Payer: Self-pay | Admitting: Obstetrics & Gynecology

## 2012-02-27 DIAGNOSIS — Z780 Asymptomatic menopausal state: Secondary | ICD-10-CM

## 2012-05-15 ENCOUNTER — Ambulatory Visit (INDEPENDENT_AMBULATORY_CARE_PROVIDER_SITE_OTHER): Payer: Medicare Other | Admitting: Gynecology

## 2012-05-15 ENCOUNTER — Encounter: Payer: Self-pay | Admitting: Gynecology

## 2012-05-15 VITALS — BP 116/80 | Resp 12 | Wt 178.0 lb

## 2012-05-15 DIAGNOSIS — N9489 Other specified conditions associated with female genital organs and menstrual cycle: Secondary | ICD-10-CM

## 2012-05-15 DIAGNOSIS — N898 Other specified noninflammatory disorders of vagina: Secondary | ICD-10-CM

## 2012-05-15 DIAGNOSIS — N949 Unspecified condition associated with female genital organs and menstrual cycle: Secondary | ICD-10-CM

## 2012-05-15 DIAGNOSIS — R1032 Left lower quadrant pain: Secondary | ICD-10-CM

## 2012-05-15 DIAGNOSIS — L293 Anogenital pruritus, unspecified: Secondary | ICD-10-CM

## 2012-05-15 LAB — POCT URINALYSIS DIPSTICK
Urobilinogen, UA: NEGATIVE
pH, UA: >=9

## 2012-05-15 LAB — POCT WET PREP (WET MOUNT): Clue Cells Wet Prep Whiff POC: NEGATIVE

## 2012-05-15 NOTE — Patient Instructions (Signed)
Ovarian Cyst The ovaries are small organs that are on each side of the uterus. The ovaries are the organs that produce the female hormones, estrogen and progesterone. An ovarian cyst is a sac filled with fluid that can vary in its size. It is normal for a small cyst to form in women who are in the childbearing age and who have menstrual periods. This type of cyst is called a follicle cyst that becomes an ovulation cyst (corpus luteum cyst) after it produces the women's egg. It later goes away on its own if the woman does not become pregnant. There are other kinds of ovarian cysts that may cause problems and may need to be treated. The most serious problem is a cyst with cancer. It should be noted that menopausal women who have an ovarian cyst are at a higher risk of it being a cancer cyst. They should be evaluated very quickly, thoroughly and followed closely. This is especially true in menopausal women because of the high rate of ovarian cancer in women in menopause. CAUSES AND TYPES OF OVARIAN CYSTS:  FUNCTIONAL CYST: The follicle/corpus luteum cyst is a functional cyst that occurs every month during ovulation with the menstrual cycle. They go away with the next menstrual cycle if the woman does not get pregnant. Usually, there are no symptoms with a functional cyst.  ENDOMETRIOMA CYST: This cyst develops from the lining of the uterus tissue. This cyst gets in or on the ovary. It grows every month from the bleeding during the menstrual period. It is also called a "chocolate cyst" because it becomes filled with blood that turns brown. This cyst can cause pain in the lower abdomen during intercourse and with your menstrual period.  CYSTADENOMA CYST: This cyst develops from the cells on the outside of the ovary. They usually are not cancerous. They can get very big and cause lower abdomen pain and pain with intercourse. This type of cyst can twist on itself, cut off its blood supply and cause severe pain. It  also can easily rupture and cause a lot of pain.  DERMOID CYST: This type of cyst is sometimes found in both ovaries. They are found to have different kinds of body tissue in the cyst. The tissue includes skin, teeth, hair, and/or cartilage. They usually do not have symptoms unless they get very big. Dermoid cysts are rarely cancerous.  POLYCYSTIC OVARY: This is a rare condition with hormone problems that produces many small cysts on both ovaries. The cysts are follicle-like cysts that never produce an egg and become a corpus luteum. It can cause an increase in body weight, infertility, acne, increase in body and facial hair and lack of menstrual periods or rare menstrual periods. Many women with this problem develop type 2 diabetes. The exact cause of this problem is unknown. A polycystic ovary is rarely cancerous.  THECA LUTEIN CYST: Occurs when too much hormone (human chorionic gonadotropin) is produced and over-stimulates the ovaries to produce an egg. They are frequently seen when doctors stimulate the ovaries for invitro-fertilization (test tube babies).  LUTEOMA CYST: This cyst is seen during pregnancy. Rarely it can cause an obstruction to the birth canal during labor and delivery. They usually go away after delivery. SYMPTOMS   Pelvic pain or pressure.  Pain during sexual intercourse.  Increasing girth (swelling) of the abdomen.  Abnormal menstrual periods.  Increasing pain with menstrual periods.  You stop having menstrual periods and you are not pregnant. DIAGNOSIS  The diagnosis can   be made during:  Routine or annual pelvic examination (common).  Ultrasound.  X-ray of the pelvis.  CT Scan.  MRI.  Blood tests. TREATMENT   Treatment may only be to follow the cyst monthly for 2 to 3 months with your caregiver. Many go away on their own, especially functional cysts.  May be aspirated (drained) with a long needle with ultrasound, or by laparoscopy (inserting a tube into  the pelvis through a small incision).  The whole cyst can be removed by laparoscopy.  Sometimes the cyst may need to be removed through an incision in the lower abdomen.  Hormone treatment is sometimes used to help dissolve certain cysts.  Birth control pills are sometimes used to help dissolve certain cysts. HOME CARE INSTRUCTIONS  Follow your caregiver's advice regarding:  Medicine.  Follow up visits to evaluate and treat the cyst.  You may need to come back or make an appointment with another caregiver, to find the exact cause of your cyst, if your caregiver is not a gynecologist.  Get your yearly and recommended pelvic examinations and Pap tests.  Let your caregiver know if you have had an ovarian cyst in the past. SEEK MEDICAL CARE IF:   Your periods are late, irregular, they stop, or are painful.  Your stomach (abdomen) or pelvic pain does not go away.  Your stomach becomes larger or swollen.  You have pressure on your bladder or trouble emptying your bladder completely.  You have painful sexual intercourse.  You have feelings of fullness, pressure, or discomfort in your stomach.  You lose weight for no apparent reason.  You feel generally ill.  You become constipated.  You lose your appetite.  You develop acne.  You have an increase in body and facial hair.  You are gaining weight, without changing your exercise and eating habits.  You think you are pregnant. SEEK IMMEDIATE MEDICAL CARE IF:   You have increasing abdominal pain.  You feel sick to your stomach (nausea) and/or vomit.  You develop a fever that comes on suddenly.  You develop abdominal pain during a bowel movement.  Your menstrual periods become heavier than usual. Document Released: 01/08/2005 Document Revised: 04/02/2011 Document Reviewed: 11/11/2008 Exit[employer] Patient Information 2013 [employer].  

## 2012-05-15 NOTE — Progress Notes (Signed)
Pt reports vaginal itching without discharge that has been intermittent.  1w.. Occaisionally pain radiates from umbillicus, associated nausea.  Pt denies fever or chills.  Nausea longer than week, no emesis.  Pt now 3y after completing radiation and chemo for breast cancer, stage III invasive ductatl, is on Letrozole and neurontin .  Pt using astroglide for lubrication.  Pt has not used OTC rx for itching.  Pt denies change in appetite.   ROS: per HPI  PE: Physical Examination: General appearance - alert, well appearing, and in no distress Abdomen - soft, nontender, nondistended, no masses or organomegaly tenderness noted in left mid quadrant and suprapubic Pelvic -  VULVA: normal appearing vulva with no masses, tenderness or lesions,  VAGINA: atrophic, vaginal discharge - white and scant, insufficient for full wet prep, lateral wall defect WET MOUNT done - results: KOH done, vaginal pH is 5, no yeast,  CERVIX: normal appearing cervix without discharge or lesions,  UTERUS: uterus is normal size, shape, consistency and nontender,  ADNEXA: tenderness left, mass present left side, size difficult to ascertain due to habitius cm    Assessment: Left lower quadrant fullness and pain History of breast cancer on letrozole Vaginal itch  Plan: Pelvic ultrasound No pathogens on wet prep-pt assured, will use cocoanut oil or EVOO Urine culture

## 2012-05-16 ENCOUNTER — Telehealth: Payer: Self-pay | Admitting: Gynecology

## 2012-05-16 NOTE — Telephone Encounter (Signed)
Patient waiting to hear about insurance benefits for ultrasound. Patient is aware the person who checks the benefits and schedules the ultrasounds if out of the office this afternoon. Patient stated Dr. Farrel Gobble wanted her to have this done Monday, 05/19/12.

## 2012-05-17 LAB — CULTURE, URINE COMPREHENSIVE: Colony Count: 70000

## 2012-05-19 ENCOUNTER — Ambulatory Visit (INDEPENDENT_AMBULATORY_CARE_PROVIDER_SITE_OTHER): Payer: Medicare Other

## 2012-05-19 ENCOUNTER — Ambulatory Visit (INDEPENDENT_AMBULATORY_CARE_PROVIDER_SITE_OTHER): Payer: Medicare Other | Admitting: Gynecology

## 2012-05-19 DIAGNOSIS — N949 Unspecified condition associated with female genital organs and menstrual cycle: Secondary | ICD-10-CM

## 2012-05-19 DIAGNOSIS — Z853 Personal history of malignant neoplasm of breast: Secondary | ICD-10-CM

## 2012-05-19 DIAGNOSIS — R1032 Left lower quadrant pain: Secondary | ICD-10-CM

## 2012-05-19 DIAGNOSIS — N83339 Acquired atrophy of ovary and fallopian tube, unspecified side: Secondary | ICD-10-CM

## 2012-05-19 DIAGNOSIS — N9489 Other specified conditions associated with female genital organs and menstrual cycle: Secondary | ICD-10-CM

## 2012-05-19 DIAGNOSIS — N3090 Cystitis, unspecified without hematuria: Secondary | ICD-10-CM

## 2012-05-19 MED ORDER — FLUCONAZOLE 150 MG PO TABS: 150.0000 mg | ORAL_TABLET | Freq: Once | ORAL | Status: DC

## 2012-05-19 MED ORDER — CEPHALEXIN 500 MG PO CAPS: 500.0000 mg | ORAL_CAPSULE | Freq: Two times a day (BID) | ORAL | Status: DC

## 2012-05-19 NOTE — Progress Notes (Signed)
      Pt and husband here to discuss u/s done for LLQ pain with fullness on exam and midline pain.  Pt with history of breast cancer stage III, concerns regarding ovarian mass.  U/S today shows normal uterus, endometrium and ovaries consistent with menopausal status.  Both assured.  Stool noted in area of left ovary Pt had left a message with oncology re possible genetic testing. Pt informed re UTI, 70k pure strept, based on sx we will treat, keflex given and diflucan prophylaxis for yeast

## 2012-05-20 ENCOUNTER — Other Ambulatory Visit: Payer: Self-pay | Admitting: Oncology

## 2012-07-03 ENCOUNTER — Other Ambulatory Visit: Payer: Self-pay | Admitting: Oncology

## 2012-07-17 ENCOUNTER — Other Ambulatory Visit: Payer: Self-pay | Admitting: Oncology

## 2012-07-17 DIAGNOSIS — C50919 Malignant neoplasm of unspecified site of unspecified female breast: Secondary | ICD-10-CM

## 2012-07-22 ENCOUNTER — Other Ambulatory Visit: Payer: Self-pay | Admitting: *Deleted

## 2012-07-22 ENCOUNTER — Other Ambulatory Visit (HOSPITAL_BASED_OUTPATIENT_CLINIC_OR_DEPARTMENT_OTHER): Payer: Medicare Other

## 2012-07-22 ENCOUNTER — Ambulatory Visit
Admission: RE | Admit: 2012-07-22 | Discharge: 2012-07-22 | Disposition: A | Discharge status: Home / Self Care | Payer: Medicare Other | Source: Ambulatory Visit | Attending: Gynecology | Admitting: Gynecology

## 2012-07-22 ENCOUNTER — Other Ambulatory Visit: Payer: Self-pay | Admitting: Oncology

## 2012-07-22 DIAGNOSIS — Z780 Asymptomatic menopausal state: Secondary | ICD-10-CM

## 2012-07-22 DIAGNOSIS — Z853 Personal history of malignant neoplasm of breast: Secondary | ICD-10-CM

## 2012-07-22 DIAGNOSIS — C50919 Malignant neoplasm of unspecified site of unspecified female breast: Secondary | ICD-10-CM

## 2012-07-22 LAB — CBC WITH DIFFERENTIAL/PLATELET
BASO%: 0.8 % (ref 0.0–2.0)
HCT: 40.8 % (ref 34.8–46.6)
LYMPH%: 27.3 % (ref 14.0–49.7)
MCHC: 35.4 g/dL (ref 31.5–36.0)
MCV: 86.3 fL (ref 79.5–101.0)
MONO#: 0.6 10*3/uL (ref 0.1–0.9)
MONO%: 8.9 % (ref 0.0–14.0)
NEUT%: 60.4 % (ref 38.4–76.8)
Platelets: 244 10*3/uL (ref 145–400)
WBC: 6.4 10*3/uL (ref 3.9–10.3)

## 2012-07-22 LAB — COMPREHENSIVE METABOLIC PANEL (CC13)
ALT: 16 U/L (ref 0–55)
Alkaline Phosphatase: 66 U/L (ref 40–150)
CO2: 25 mEq/L (ref 22–29)
Creatinine: 0.9 mg/dL (ref 0.6–1.1)
Glucose: 96 mg/dl (ref 70–140)
Total Bilirubin: 0.48 mg/dL (ref 0.20–1.20)

## 2012-07-22 MED ORDER — LETROZOLE 2.5 MG PO TABS: 2.5000 mg | ORAL_TABLET | Freq: Every day | ORAL | Status: DC

## 2012-07-29 ENCOUNTER — Ambulatory Visit (HOSPITAL_BASED_OUTPATIENT_CLINIC_OR_DEPARTMENT_OTHER): Payer: Medicare Other

## 2012-07-29 ENCOUNTER — Telehealth: Payer: Self-pay | Admitting: Oncology

## 2012-07-29 ENCOUNTER — Other Ambulatory Visit: Payer: Medicare Other | Admitting: Lab

## 2012-07-29 ENCOUNTER — Ambulatory Visit (HOSPITAL_COMMUNITY)
Admission: RE | Admit: 2012-07-29 | Discharge: 2012-07-29 | Disposition: A | Discharge status: Home / Self Care | Payer: Medicare Other | Source: Ambulatory Visit | Attending: Oncology | Admitting: Oncology

## 2012-07-29 ENCOUNTER — Ambulatory Visit (HOSPITAL_BASED_OUTPATIENT_CLINIC_OR_DEPARTMENT_OTHER): Payer: Medicare Other | Admitting: Oncology

## 2012-07-29 VITALS — BP 131/80 | HR 77 | Temp 98.4°F | Resp 20 | Ht 64.0 in | Wt 172.3 lb

## 2012-07-29 DIAGNOSIS — Z853 Personal history of malignant neoplasm of breast: Secondary | ICD-10-CM

## 2012-07-29 DIAGNOSIS — C50919 Malignant neoplasm of unspecified site of unspecified female breast: Secondary | ICD-10-CM

## 2012-07-29 DIAGNOSIS — M899 Disorder of bone, unspecified: Secondary | ICD-10-CM

## 2012-07-29 DIAGNOSIS — M949 Disorder of cartilage, unspecified: Secondary | ICD-10-CM

## 2012-07-29 DIAGNOSIS — C50912 Malignant neoplasm of unspecified site of left female breast: Secondary | ICD-10-CM

## 2012-07-29 DIAGNOSIS — R059 Cough, unspecified: Secondary | ICD-10-CM | POA: Insufficient documentation

## 2012-07-29 DIAGNOSIS — I059 Rheumatic mitral valve disease, unspecified: Secondary | ICD-10-CM

## 2012-07-29 DIAGNOSIS — C773 Secondary and unspecified malignant neoplasm of axilla and upper limb lymph nodes: Secondary | ICD-10-CM

## 2012-07-29 DIAGNOSIS — R05 Cough: Secondary | ICD-10-CM | POA: Insufficient documentation

## 2012-07-29 MED ORDER — ZOLEDRONIC ACID 4 MG/100ML IV SOLN
4.0000 mg | Freq: Once | INTRAVENOUS | Status: AC
  Administered 2012-07-29: 4 mg via INTRAVENOUS
  Filled 2012-07-29: qty 100

## 2012-07-29 MED ORDER — SODIUM CHLORIDE 0.9 % IV SOLN
Freq: Once | INTRAVENOUS | Status: AC
  Administered 2012-07-29: 10:00:00 via INTRAVENOUS

## 2012-07-29 NOTE — Patient Instructions (Addendum)
Zoledronic Acid injection (Hypercalcemia, Oncology) What is this medicine? ZOLEDRONIC ACID (ZOE le dron ik AS id) lowers the amount of calcium loss from bone. It is used to treat too much calcium in your blood from cancer. It is also used to prevent complications of cancer that has spread to the bone. This medicine may be used for other purposes; ask your health care provider or pharmacist if you have questions. What should I tell my health care provider before I take this medicine? They need to know if you have any of these conditions: -aspirin-sensitive asthma -dental disease -kidney disease -an unusual or allergic reaction to zoledronic acid, other medicines, foods, dyes, or preservatives -pregnant or trying to get pregnant -breast-feeding How should I use this medicine? This medicine is for infusion into a vein. It is given by a health care professional in a hospital or clinic setting. Talk to your pediatrician regarding the use of this medicine in children. Special care may be needed. Overdosage: If you think you have taken too much of this medicine contact a poison control center or emergency room at once. NOTE: This medicine is only for you. Do not share this medicine with others. What if I miss a dose? It is important not to miss your dose. Call your doctor or health care professional if you are unable to keep an appointment. What may interact with this medicine? -certain antibiotics given by injection -NSAIDs, medicines for pain and inflammation, like ibuprofen or naproxen -some diuretics like bumetanide, furosemide -teriparatide -thalidomide This list may not describe all possible interactions. Give your health care provider a list of all the medicines, herbs, non-prescription drugs, or dietary supplements you use. Also tell them if you smoke, drink alcohol, or use illegal drugs. Some items may interact with your medicine. What should I watch for while using this medicine? Visit  your doctor or health care professional for regular checkups. It may be some time before you see the benefit from this medicine. Do not stop taking your medicine unless your doctor tells you to. Your doctor may order blood tests or other tests to see how you are doing. Women should inform their doctor if they wish to become pregnant or think they might be pregnant. There is a potential for serious side effects to an unborn child. Talk to your health care professional or pharmacist for more information. You should make sure that you get enough calcium and vitamin D while you are taking this medicine. Discuss the foods you eat and the vitamins you take with your health care professional. Some people who take this medicine have severe bone, joint, and/or muscle pain. This medicine may also increase your risk for a broken thigh bone. Tell your doctor right away if you have pain in your upper leg or groin. Tell your doctor if you have any pain that does not go away or that gets worse. What side effects may I notice from receiving this medicine? Side effects that you should report to your doctor or health care professional as soon as possible: -allergic reactions like skin rash, itching or hives, swelling of the face, lips, or tongue -anxiety, confusion, or depression -breathing problems -changes in vision -feeling faint or lightheaded, falls -jaw burning, cramping, pain -muscle cramps, stiffness, or weakness -trouble passing urine or change in the amount of urine Side effects that usually do not require medical attention (report to your doctor or health care professional if they continue or are bothersome): -bone, joint, or muscle pain -  fever -hair loss -irritation at site where injected -loss of appetite -nausea, vomiting -stomach upset -tired This list may not describe all possible side effects. Call your doctor for medical advice about side effects. You may report side effects to FDA at  1-800-FDA-1088. Where should I keep my medicine? This drug is given in a hospital or clinic and will not be stored at home. NOTE: This sheet is a summary. It may not cover all possible information. If you have questions about this medicine, talk to your doctor, pharmacist, or health care provider.  2013, [employer]. (07/07/2010 9:06:58 AM)  

## 2012-07-29 NOTE — Progress Notes (Signed)
ID: Brooke [name]   DOB: May 12, 1950  MR#: [id number]  EXB#:[id number]  HISTORY OF PRESENT ILLNESS: Brooke [name] had screening mammography in August 04, 2007 which showed no specific mammographic evidence of malignancy, but she reported a left breast lump.  Accordingly, she was brought back on August 11, 2007 for mammography and ultrasonography.  There was indeed an obscured mass in the upper left breast which by ultrasound appeared to be a simple cyst.    Screening mammogram on August 04, 2008 showed in addition to dense breasts, again a possible mass in the left breast.  She had diagnostic mammography August 06, 2008 and additional views in addition to very dense breasts did not show any persistent mass or distortion.  Furthermore, Dr. Manson Passey was not able to palpate any abnormality in the lateral portion of the left breast.  Ultrasound showed normal appearing fibroglandular tissue in the area in question.    More recently, about September, the patient felt again something like a lump in the left breast and she had some pain associated with this.  This was initially felt simply to be mastalgia and was treated as such, but as it persisted, on December 23rd, the patient had left diagnostic mammography and ultrasonography.  There was a focally dense area in the left breast with very minimal distortion corresponding to the area of palpable abnormality. The ultrasound showed an irregular ill-defined, hypoechoic area with distal shadowing, measuring approximately 1 cm corresponding to the patient's palpable abnormality.    With this information, the patient was brought back for ultrasound-guided core biopsy and this was performed December 28th.  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  There was no evidence of angiolymphatic invasion in the material submitted which measured 1.5 cm maximally.  In addition to the mass in the breast, a lymph node in the left axilla  was biopsied and was likewise positive.  The tumor was ER positive at 89%, PR positive at 34% with an elevated MIB-1 at 44% and Her-2 negative with a ratio of 1.13.  Her subsequent history is as detailed below.  INTERVAL HISTORY: Brooke [name] returns today with her husband Brooke [name] for followup of her breast cancer. She continues on letrozole which she is tolerating well. She will receive her zoledronic acid today.  REVIEW OF SYSTEMS: Brooke [name] is not exercising as regularly as before because of the hot weather. Mostly of a walker. She does not belong to a gym. They are planning a trip to [place] in September and 29 had this week. She has developed a little bit of a dry cough, not associated with chest pain or pressure, pleurisy, or fever. She is a little bit at constipated she thinks because they've changed their diet (working out of [employer]". She is continuing to have problems with hot flashes. She feels anxious, already anticipating the date when she may stop her letrozole, and "perhaps the cancer will come back then".  A detailed review of systems today was otherwise noncontributory   PAST MEDICAL HISTORY: Past Medical History  Diagnosis Date  . Hx Breast Cancer, IDC, Stahe III, receptor + 10/20/2010    left breast  1. Remote migraines, currently inactive.   2. History of prior diagnosis of fibromyalgia made at [facility] and currently not active (she was treated with trimethoprim and nortriptyline for one year for this).    3. History of recurrent UTIs.   4. History of benign left breast cyst aspirated in  2004 at [facility] under Dr. Christain Sacramento.   5. History of tonsillectomy and adenoidectomy.   6. History of right LASEK surgery. 7. History of bilateral reduction mammoplasties in 1994.   8. History of mitral valve prolapse diagnosed in 1984.  PAST SURGICAL HISTORY: Past Surgical History  Procedure Laterality Date  . Mastectomy Bilateral 2011    left breast cancer     FAMILY HISTORY Family History  Problem Relation Age of Onset  . Cancer Father     lung  . Heart disease Maternal Grandmother   . Heart disease Maternal Grandfather   [name] died at the age of 60 from lung cancer.  He had been a remote smoker and had been in [employer] with a question of possible asbestos exposure.  [name] died at the age of 69 in the setting of Alzheimer's, coronary artery disease, diabetes and hypertension.  The patient had two sisters.  One died from complications of diabetes.  The other one is alive with diabetes.    GYNECOLOGIC HISTORY: She is GX P1.  First pregnancy to term at age 31.  The patient's last period was in 2008.  She took hormone replacement for about 18 months until mid-2008.  She had no complications from that treatment.    SOCIAL HISTORY: She used to be a Associate Professor for [employer].  They used to live in the [place] area. 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  Her husband, Brooke [name], retired October 2012.  He was a Scientific laboratory technician for 28 years.  Their son Brooke [name], is currently with the [employer] at the [employer] in [place]. [name].  She attends [employer].   ADVANCED DIRECTIVES: in place  HEALTH MAINTENANCE: History  Substance Use Topics  . Smoking status: Former Smoker    Quit date: 01/23/1984  . Smokeless tobacco: Not on file  . Alcohol Use: No     Colonoscopy: 2010  PAP: UTD/Miller  Bone density: 03/2009  Lipid panel: per Dr Valentina Lucks  Allergies  Allergen Reactions  . Sulfa Antibiotics Shortness Of Breath  . Codeine Nausea And Vomiting    Current Outpatient Prescriptions  Medication Sig Dispense Refill  . Calcium Citrate-Vitamin D (CITRACAL/VITAMIN D PO) Take by mouth daily.      . cephALEXin (KEFLEX) 500 MG capsule Take 1 capsule (500 mg total) by mouth 2 (two) times daily. Take QID for 7 days.  14 capsule  0   . fluconazole (DIFLUCAN) 150 MG tablet Take 1 tablet (150 mg total) by mouth once.  1 tablet  0  . gabapentin (NEURONTIN) 300 MG capsule TAKE 1 CAPSULE (300 MG TOTAL) BY MOUTH AT BEDTIME.  90 capsule  0  . letrozole (FEMARA) 2.5 MG tablet Take 1 tablet (2.5 mg total) by mouth daily.  90 tablet  3  . LORazepam (ATIVAN) 0.5 MG tablet TAKE 1 TABLET BY MOUTH 2 TIMES DAILY FOR ANXIETY  60 tablet  2  . valACYclovir (VALTREX) 500 MG tablet Take 500 mg by mouth as needed.      . venlafaxine XR (EFFEXOR-XR) 37.5 MG 24 hr capsule Take 1 capsule (37.5 mg total) by mouth 2 (two) times daily.  180 capsule  3  . Zoledronic Acid (RECLAST IV) Inject into the vein every 6 (six) months.       No current facility-administered medications for this visit.    OBJECTIVE: Middle-aged  white woman in no acute distress  Filed Vitals:   07/29/12 0930  BP: 131/80  Pulse: 77  Temp: 98.4 F (36.9 C)  Resp: 20     Body mass index is 29.56 kg/(m^2).    ECOG FS: 0 Filed Weights   07/29/12 0930  Weight: 172 lb 4.8 oz (78.155 kg)    Sclerae unicteric Oropharynx clear No cervical or supraclavicular adenopathy Lungs no rales or rhonchi, good excursion bilaterally Heart regular rate and rhythm Abdomen soft, nontender, positive bowel sounds MSK no focal spinal tenderness, no peripheral edema Neuro: nonfocal, well oriented, anxious but friendly affect Breasts: Status post bilateral mastectomies. No evidence of local recurrence. The left axilla is benign  LAB RESULTS: Lab Results  Component Value Date   WBC 6.4 07/22/2012   NEUTROABS 3.9 07/22/2012   HGB 14.4 07/22/2012   HCT 40.8 07/22/2012   MCV 86.3 07/22/2012   PLT 244 07/22/2012      Chemistry      Component Value Date/Time   NA 137 07/22/2012 0817   NA 142 06/26/2011 0728   NA 143 12/12/2010 1325   K 4.0 07/22/2012 0817   K 4.6 06/26/2011 0728   K 4.7 12/12/2010 1325   CL 105 01/07/2012 1347   CL 102 06/26/2011 0728   CL 105 12/12/2010 1325   CO2 25 07/22/2012 0817    CO2 30 06/26/2011 0728   CO2 27 12/12/2010 1325   BUN 10.5 07/22/2012 0817   BUN 12 06/26/2011 0728   BUN 14 12/12/2010 1325   CREATININE 0.9 07/22/2012 0817   CREATININE 1.1 06/26/2011 0728   CREATININE 0.96 12/12/2010 1325      Component Value Date/Time   CALCIUM 10.0 07/22/2012 0817   CALCIUM 9.1 06/26/2011 0728   CALCIUM 10.1 12/12/2010 1325   ALKPHOS 66 07/22/2012 0817   ALKPHOS 54 12/12/2010 1325   AST 18 07/22/2012 0817   AST 19 12/12/2010 1325   ALT 16 07/22/2012 0817   ALT 19 12/12/2010 1325   BILITOT 0.48 07/22/2012 0817   BILITOT 0.3 12/12/2010 1325       Lab Results  Component Value Date   LABCA2 21 01/07/2012    STUDIES:  Dg Bone Density  07/22/2012   *RADIOLOGY REPORT*  Clinical Data: 62 year old postmenopausal female with history of breast cancer and low bone mass.  The patient takes calcium, vitamin D, Letrozole, and zoledronic acid.  DUAL X-RAY ABSORPTIOMETRY (DXA) FOR BONE MINERAL DENSITY  AP LUMBAR SPINE (L1 - L4)  Bone Mineral Density (BMD):            0.964 g/cm2 Young Adult T Score:                          -0.8 Z Score:                                                0.7  LEFT FEMUR (NECK)  Bone Mineral Density (BMD):             0.697 g/cm2 Young Adult T Score:                           -1.4 Z Score:                                                 -  0.1  ASSESSMENT:  Patient's diagnostic category is LOW BONE MASS by WHO Criteria.  FRACTURE RISK: UNKNOWN  FRAX: World Health Organization FRAX assessment of absolute fracture risk is not calculated for this patient because the patient has a history of bone building therapy.  Comparison: There has been a statistically significant 7.6% increase in BMD in the spine and a statistically significant 4.8% increase in BMD in the total left hip as compared to 04/05/2010.  RECOMMENDATIONS:  All patients should ensure an adequate intake of dietary calcium (1200mg  daily) and vitamin D (800 IU daily) unless contraindicated. The National  Osteoporosis Foundation recommends that FDA-approved medical therapies be considered in postmenopausal women and mean age 74 or older with a:  1)    Hip or vertebral (clinical or morphometric) fracture.  2)   T-score of -2.5 or lower at the spine or hip.  3)   Ten-year fracture probability by FRAX of 3% or greater for hip fracture or 20% or greater for major osteoporotic fracture.  FOLLOW-UP:  People with diagnosed cases of osteoporosis or at high risk for fracture should have regular bone mineral density tests.  For patients eligible for Medicare, routine testing is allowed once every 2 years.  The testing frequency can be increased to one year for patients who have rapidly progressing disease, those who are receiving or discontinuing medical therapy to restore bone mass, or have additional risk factors.   Original Report Authenticated By: Cain Saupe, M.D.     ASSESSMENT: 62 y.o.  woman   (1)  status post Left breast and Left axillary lymph node biopsy Dec 2010 for a cT3 pN1 (stage IIIA) invasive ductal carcinoma, grade 2, estrogen and progesterone receptor positive, HER-2 negative, with an MIB-1 of 44%;   (2)  treated neoadjuvantly with dose dense cyclophosphamide and doxorubicin x4 followed by weekly paclitaxel x7  (3) s/p bilateral mastectomies May 2011. She had a residual 1.5 cm tumor, and one of 8 sampled lymph nodes was positive [ypT1c ypN1]. Margins were negative.   (4)  She completed radiation in August of 2011   (5) on letrozole since August 2011   PLAN:  Brooke [name] is doing fine, with no evidence of recurrence now slightly more than 3 years out from her definitive surgery. The plan is to continue letrozole perhaps as long as 10 years. We will make that decision when she gets the 5 year mark. We are continuing the zolendronate yearly so long as she remains on letrozole. That is doing a good job at improving her bone density.  She wonders if she should have more extensive  restaging studies. We discussed issues relating to radiation and false positives. She will have a chest x-ray today and recurrence early do that on a once a year basis. He she really once to reduce her risk of recurrence maximally she should exercise 5 days a week for 45 minutes and I gave her a live strong pamphlet today. That will also help with the anxiety issue, the weight issue which is a concern to her, and of course cardiovascular health.  She knows to call for any problems that may develop before the next visit.   Tyaira Heward C    07/29/2012

## 2012-07-30 ENCOUNTER — Encounter: Payer: Self-pay | Admitting: Gynecology

## 2012-09-01 ENCOUNTER — Telehealth: Payer: Self-pay | Admitting: Gynecology

## 2012-09-01 NOTE — Telephone Encounter (Signed)
Returning phone call °

## 2012-09-01 NOTE — Telephone Encounter (Signed)
i'd suggest 7d of cipro and diflucan, please inform before we send in, wish her a nice trip

## 2012-09-01 NOTE — Telephone Encounter (Signed)
Patient states she is going out of the country to the [place] , [place],  September 1st  - September 17th 2014 and is requesting preventative  Medications to have with her.  Cephalexin 500mg  x 14days and fluconazole x 1 per patient.    Suggested to patient she also call [facility]. For more information for what is required for the area she is traveling to. Patient was appreciative of this information. Pharmacy [facility].

## 2012-09-01 NOTE — Telephone Encounter (Signed)
Left message for patient to return call to office for more information.

## 2012-09-01 NOTE — Telephone Encounter (Signed)
Is traveling overseas and would like a prescription for cephalexin 500 mg X 14 days and fluconazole 1 pill.   Can call in to [facility] if approved.

## 2012-09-02 ENCOUNTER — Telehealth: Payer: Self-pay | Admitting: *Deleted

## 2012-09-02 ENCOUNTER — Other Ambulatory Visit: Payer: Self-pay | Admitting: Gynecology

## 2012-09-02 MED ORDER — CIPROFLOXACIN HCL 500 MG PO TABS: 500.0000 mg | ORAL_TABLET | Freq: Two times a day (BID) | ORAL | Status: DC

## 2012-09-02 MED ORDER — FLUCONAZOLE 150 MG PO TABS: 150.0000 mg | ORAL_TABLET | Freq: Once | ORAL | Status: DC

## 2012-09-02 NOTE — Telephone Encounter (Signed)
Per Dr. Farrel Gobble okay to call in cipro 500 mg #14/0rf's & Diflucan 150 mg #1 into patient's pharmacy. (See Notes)

## 2012-09-02 NOTE — Telephone Encounter (Signed)
Patient notified of Dr. Farrel Gobble suggestion of Cipro and diflucan. Patient agreeable to this to use for possible UTI if needed.  Patient appreciative of you help with this.

## 2012-09-02 NOTE — Telephone Encounter (Signed)
Left message on CB# to call office for response of medications.

## 2012-09-12 ENCOUNTER — Other Ambulatory Visit: Payer: Self-pay | Admitting: Oncology

## 2012-09-12 DIAGNOSIS — Z853 Personal history of malignant neoplasm of breast: Secondary | ICD-10-CM

## 2012-09-18 ENCOUNTER — Ambulatory Visit (HOSPITAL_BASED_OUTPATIENT_CLINIC_OR_DEPARTMENT_OTHER): Payer: Medicare Other | Admitting: Genetic Counselor

## 2012-09-18 ENCOUNTER — Other Ambulatory Visit: Payer: Medicare Other | Admitting: Lab

## 2012-09-18 ENCOUNTER — Encounter: Payer: Self-pay | Admitting: Genetic Counselor

## 2012-09-18 DIAGNOSIS — C773 Secondary and unspecified malignant neoplasm of axilla and upper limb lymph nodes: Secondary | ICD-10-CM

## 2012-09-18 DIAGNOSIS — C50919 Malignant neoplasm of unspecified site of unspecified female breast: Secondary | ICD-10-CM

## 2012-09-18 DIAGNOSIS — Z8049 Family history of malignant neoplasm of other genital organs: Secondary | ICD-10-CM

## 2012-09-18 DIAGNOSIS — Z803 Family history of malignant neoplasm of breast: Secondary | ICD-10-CM

## 2012-09-18 DIAGNOSIS — Z853 Personal history of malignant neoplasm of breast: Secondary | ICD-10-CM

## 2012-09-18 DIAGNOSIS — IMO0002 Reserved for concepts with insufficient information to code with codable children: Secondary | ICD-10-CM

## 2012-09-18 NOTE — Progress Notes (Signed)
Dr.  Raymond Gurney Magrinat requested a consultation for genetic counseling and risk assessment for Brooke [name], a 62 y.o. female, for discussion of her personal history of breast cancer and family history of uterine and breast cancer.  She presents to clinic today to discuss the possibility of a genetic predisposition to cancer, and to further clarify her risks, as well as her family members' risks for cancer.   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 This was treated with bilateral mastectomy, chemotherapy and radiation.  The tumor was ER+/PR+/Her2-. Brooke [name] took tamoxifen for 5 years.      Past Medical History  Diagnosis Date  . Hx Breast Cancer, IDC, Stahe III, receptor + 10/20/2010    left breast  . Breast cancer December 2010    invasive ductal and invasive lobular; bilateral mastectomy and radiation    Past Surgical History  Procedure Laterality Date  . Mastectomy Bilateral 2011    left breast cancer    History   Social History  . Marital Status: Married    Spouse Name: Brooke [name]    Number of Children: 1  . Years of Education: N/A   Social History Main Topics  . Smoking status: Former Smoker -- 1.00 packs/day for 11 years    Quit date: 01/23/1984  . Smokeless tobacco: None  . Alcohol Use: Yes     Comment: wine, 3x/week  . Drug Use: No  . Sexual Activity: None   Other Topics Concern  . None   Social History Narrative  . None    REPRODUCTIVE HISTORY AND PERSONAL RISK ASSESSMENT FACTORS: Menarche was at age 72-13.   postmenopausal Uterus Intact: yes Ovaries Intact: yes G1P1A0, first live birth at age 26  She has not previously undergone treatment for infertility.   Oral Contraceptive use: 15 years   She has used HRT in the past.    FAMILY HISTORY:  We obtained a detailed, 4-generation family history.  Significant diagnoses are  listed below: Family History  Problem Relation Age of Onset  . Lung cancer Father 72    smoker for 25 years  . Heart disease Maternal Grandmother   . Heart disease Maternal Grandfather   . Diabetes type I Mother   . Diabetes type I Sister   . Diabetes type I Sister   . Heart disease Maternal Uncle   . Uterine cancer Paternal Grandmother 23  . Breast cancer Other 71    paternal great grandmother    Patient's maternal ancestors are of British/English descent, and paternal ancestors are of [place] and [place] descent. There is no reported Ashkenazi Jewish ancestry. There is no known consanguinity.  GENETIC COUNSELING ASSESSMENT: Brooke [name] is a 62 y.o. female with a personal history of breast cancer which somewhat suggestive of a post-menopausal breast cancer. We, therefore, discussed and recommended the following at today's visit.   DISCUSSION: We reviewed the characteristics, features and inheritance patterns of hereditary cancer syndromes. We also discussed genetic testing, including the appropriate family members to test, the process of testing, insurance coverage and turn-around-time for results. We discussed that, based on her family history, she did not meet medical criteria for performing genetic testing.  We reviewed different panels and some of the genes tested.  We also reviewed the increased risk for testing positive for a BRCA mutation based on jewish ancestry, which she does not  have, but her husband does.  Even though she does not meet medical criteria for genetic testing, she would like to continue with testing and wants the full 21 gene breast/ovarian cancer panel.  She states that she understands that only "outliers" will test positive based on medical criteria, however, she feels that she is consistently one of those outliers.  PLAN: After considering the risks, benefits, and limitations, Brooke [name] provided informed consent to pursue genetic testing and the  blood sample will be sent to [facility] for analysis of the Breast/Ovarian cancer panel. We discussed the implications of a positive, negative and/ or variant of uncertain significance genetic test result. Results should be available within approximately 3-4 weeks' time, at which point they will be disclosed by telephone to Brooke [name], as will any additional recommendations warranted by these results. Brooke [name] will receive a summary of her genetic counseling visit and a copy of her results once available. This information will also be available in Epic. We encouraged Brooke [name] to remain in contact with cancer genetics annually so that we can continuously update the family history and inform her of any changes in cancer genetics and testing that may be of benefit for her family. Brooke [name]'s questions were answered to her satisfaction today. Our contact information was provided should additional questions or concerns arise.  The patient was seen for a total of 60 minutes, greater than 50% of which was spent face-to-face counseling.  This plan is being carried out per Dr. Feliz Beam recommendations.  This note will also be sent to the referring provider via the electronic medical record. The patient will be supplied with a summary of this genetic counseling discussion as well as educational information on the discussed hereditary cancer syndromes following the conclusion of their visit.   Patient was discussed with Dr. Drue Second.   _______________________________________________________________________ For Office Staff:  Number of people involved in session: 2 Was an Intern/ student involved with case: no

## 2012-10-01 ENCOUNTER — Telehealth: Payer: Self-pay | Admitting: Genetic Counselor

## 2012-10-01 NOTE — Telephone Encounter (Signed)
Left good news message on mobile.

## 2012-10-01 NOTE — Telephone Encounter (Signed)
Left good news message on home phone

## 2012-10-14 ENCOUNTER — Encounter: Payer: Self-pay | Admitting: Genetic Counselor

## 2012-10-14 ENCOUNTER — Telehealth: Payer: Self-pay | Admitting: Genetic Counselor

## 2012-10-14 NOTE — Telephone Encounter (Signed)
Revealed negative genetic testing.    

## 2012-10-20 ENCOUNTER — Encounter: Payer: Self-pay | Admitting: Physician Assistant

## 2012-10-21 ENCOUNTER — Telehealth: Payer: Self-pay | Admitting: *Deleted

## 2012-10-21 DIAGNOSIS — C50919 Malignant neoplasm of unspecified site of unspecified female breast: Secondary | ICD-10-CM

## 2012-10-21 DIAGNOSIS — R0781 Pleurodynia: Secondary | ICD-10-CM

## 2012-10-21 NOTE — Telephone Encounter (Signed)
Pt left message requesting an appointment with MD to discuss ongoing rib pain " my ribs continue to be tender ". Ongoing pain has been bilateral but now above symptom is increasing on the right side and more notable with laying on this side, and with clothes touching area.  [name] denies any noted rashes, redness or swelling.  Right side is tender to palpation.   This note will be given to MD for review for appropriate follow up.  Return call number given as [phone].

## 2012-10-22 ENCOUNTER — Encounter: Payer: Self-pay | Admitting: Gynecology

## 2012-10-22 ENCOUNTER — Telehealth: Payer: Self-pay | Admitting: Obstetrics & Gynecology

## 2012-10-22 ENCOUNTER — Other Ambulatory Visit: Payer: Self-pay | Admitting: Oncology

## 2012-10-22 ENCOUNTER — Telehealth: Payer: Self-pay | Admitting: Obstetrics and Gynecology

## 2012-10-22 NOTE — Telephone Encounter (Signed)
Called and spoke to patient, per Dr Darnelle Catalan, he would like to go ahead and get PET scan since she has not had any scans in over a year for restaging. Patient agreeable with plan. Orders to scheduling.

## 2012-10-22 NOTE — Addendum Note (Signed)
Addended by: Laroy Apple E on: 10/22/2012 03:41 PM   Modules accepted: Orders

## 2012-10-22 NOTE — Telephone Encounter (Signed)
Patient wants to speak with nurse about lab results from another practice please?

## 2012-10-22 NOTE — Telephone Encounter (Signed)
Patient was at [facility] walk in clinic on Saturday and urine culture came back + for group B Strep. Was first on Cipro and now given rx for Amoxicillin 500 mg BID. She would like to know if you agree with the treatment or if you feel she should receive a different antibiotic. Patient also wants to know if she should be seen and find out what to do since she has had two episodes of the Group B strep urine cultures in 4 months.   Patient denies complaints at this time, requesting second opinion from Dr. Farrel Gobble.

## 2012-10-23 ENCOUNTER — Telehealth: Payer: Self-pay | Admitting: Oncology

## 2012-10-23 NOTE — Telephone Encounter (Signed)
lmonvm advising the pt of her f/u appt on 11/07/2012 with dr Darnelle Catalan.

## 2012-10-27 NOTE — Telephone Encounter (Signed)
Patient has read mychart email from Dr. Farrel Gobble, will close encounter.  From Bennye Alm, MD [[id number]] To Brooke [name] Composed 10/23/2012 8:10 AM For Delivery On 10/23/2012 8:10 AM Subject RE: Non-Urgent Medical Question Message Type Patient Medical Advice Request Read Status Y By Brooke [name] - last viewed at 8:58 AM on 10/23/2012 Message Body Yes, it may give you a yeast infection, call if you need a diflcuan, or can use an over the counter   ----- Message -----  From: Brooke [name]  Sent: 10/22/2012 12:42 PM EDT  To: Bennye Alm, MD  Subject: Non-Urgent Medical Question   Dr. Farrel Gobble,   On Saturday I went to [facility] urgent care and wanted to pass on the following lab results.   Assessments: Dysuria, Test Name:, Urine Culture, Routine ([id number]), URINE CULTURE, ROUTINE , Result: Beta hemolytic streptococcus   Until they had the results, I was started on Cipro for 3 days and they called and switched me to Amoxcillin 500 2x a day for 7 days.   Am I on the right path?

## 2012-11-04 ENCOUNTER — Other Ambulatory Visit: Payer: Self-pay | Admitting: Oncology

## 2012-11-05 ENCOUNTER — Other Ambulatory Visit: Payer: Self-pay | Admitting: *Deleted

## 2012-11-05 ENCOUNTER — Encounter (HOSPITAL_COMMUNITY)
Admission: RE | Admit: 2012-11-05 | Discharge: 2012-11-05 | Disposition: A | Discharge status: Home / Self Care | Payer: Medicare Other | Source: Ambulatory Visit | Attending: Oncology | Admitting: Oncology

## 2012-11-05 ENCOUNTER — Ambulatory Visit (HOSPITAL_BASED_OUTPATIENT_CLINIC_OR_DEPARTMENT_OTHER): Payer: Medicare Other

## 2012-11-05 ENCOUNTER — Telehealth: Payer: Self-pay | Admitting: *Deleted

## 2012-11-05 ENCOUNTER — Other Ambulatory Visit: Payer: Self-pay | Admitting: Oncology

## 2012-11-05 ENCOUNTER — Ambulatory Visit (HOSPITAL_BASED_OUTPATIENT_CLINIC_OR_DEPARTMENT_OTHER): Payer: Medicare Other | Admitting: Oncology

## 2012-11-05 VITALS — BP 127/80 | HR 84 | Temp 97.4°F | Resp 20 | Ht 64.0 in | Wt 174.9 lb

## 2012-11-05 DIAGNOSIS — C773 Secondary and unspecified malignant neoplasm of axilla and upper limb lymph nodes: Secondary | ICD-10-CM

## 2012-11-05 DIAGNOSIS — Z9010 Acquired absence of unspecified breast and nipple: Secondary | ICD-10-CM | POA: Insufficient documentation

## 2012-11-05 DIAGNOSIS — G893 Neoplasm related pain (acute) (chronic): Secondary | ICD-10-CM

## 2012-11-05 DIAGNOSIS — Z853 Personal history of malignant neoplasm of breast: Secondary | ICD-10-CM

## 2012-11-05 DIAGNOSIS — C50919 Malignant neoplasm of unspecified site of unspecified female breast: Secondary | ICD-10-CM

## 2012-11-05 DIAGNOSIS — C7951 Secondary malignant neoplasm of bone: Secondary | ICD-10-CM | POA: Insufficient documentation

## 2012-11-05 DIAGNOSIS — Z170 Estrogen receptor positive status [ER+]: Secondary | ICD-10-CM

## 2012-11-05 DIAGNOSIS — M8448XA Pathological fracture, other site, initial encounter for fracture: Secondary | ICD-10-CM | POA: Insufficient documentation

## 2012-11-05 DIAGNOSIS — Z5111 Encounter for antineoplastic chemotherapy: Secondary | ICD-10-CM

## 2012-11-05 MED ORDER — FULVESTRANT 250 MG/5ML IM SOLN
500.0000 mg | Freq: Once | INTRAMUSCULAR | Status: AC
  Administered 2012-11-05: 500 mg via INTRAMUSCULAR
  Filled 2012-11-05: qty 10

## 2012-11-05 MED ORDER — FLUDEOXYGLUCOSE F - 18 (FDG) INJECTION
18.8000 | Freq: Once | INTRAVENOUS | Status: AC | PRN
  Administered 2012-11-05: 18.8 via INTRAVENOUS

## 2012-11-05 MED ORDER — SODIUM CHLORIDE 0.9 % IV SOLN
Freq: Once | INTRAVENOUS | Status: AC
  Administered 2012-11-05: 17:00:00 via INTRAVENOUS

## 2012-11-05 MED ORDER — SODIUM CHLORIDE 0.9 % IJ SOLN
10.0000 mL | INTRAMUSCULAR | Status: DC | PRN
  Filled 2012-11-05: qty 10

## 2012-11-05 MED ORDER — ZOLEDRONIC ACID 4 MG/100ML IV SOLN
4.0000 mg | Freq: Once | INTRAVENOUS | Status: AC
  Administered 2012-11-05: 4 mg via INTRAVENOUS
  Filled 2012-11-05: qty 100

## 2012-11-05 NOTE — Progress Notes (Signed)
ID: Brooke [name]   DOB: Oct 24, 1950  MR#: [id number]  VHQ#:[id number]  PCP: Lillia Mountain, MD GYN: SU:  OTHER MD: Lurline Hare, Homero Fellers Aluisio   HISTORY OF PRESENT ILLNESS: Brooke [name] had screening mammography in August 04, 2007 which showed no specific mammographic evidence of malignancy, but she reported a left breast lump.  Accordingly, she was brought back on August 11, 2007 for mammography and ultrasonography.  There was indeed an obscured mass in the upper left breast which by ultrasound appeared to be a simple cyst.    Screening mammogram on August 04, 2008 showed in addition to dense breasts, again a possible mass in the left breast.  She had diagnostic mammography August 06, 2008 and additional views in addition to very dense breasts did not show any persistent mass or distortion.  Furthermore, Dr. Manson Passey was not able to palpate any abnormality in the lateral portion of the left breast.  Ultrasound showed normal appearing fibroglandular tissue in the area in question.    More recently, about September, the patient felt again something like a lump in the left breast and she had some pain associated with this.  This was initially felt simply to be mastalgia and was treated as such, but as it persisted, on December 23rd, the patient had left diagnostic mammography and ultrasonography.  There was a focally dense area in the left breast with very minimal distortion corresponding to the area of palpable abnormality. The ultrasound showed an irregular ill-defined, hypoechoic area with distal shadowing, measuring approximately 1 cm corresponding to the patient's palpable abnormality.    With this information, the patient was brought back for ultrasound-guided core biopsy and this was performed December 28th.  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  There was no evidence of angiolymphatic invasion in the material submitted which measured  1.5 cm maximally.  In addition to the mass in the breast, a lymph node in the left axilla was biopsied and was likewise positive.  The tumor was ER positive at 89%, PR positive at 34% with an elevated MIB-1 at 44% and Her-2 negative with a ratio of 1.13.  Her subsequent history is as detailed below.  INTERVAL HISTORY: Brooke [name] returns today with her husband Brooke [name] for followup of her breast cancer. She had been complaining of some discomfort in the right chest wall area, and we decided to evaluate this further with a PET scan which was obtained 11/05/2012. Unfortunately this shows multifocal hypermetabolic bony metastatic disease. There is a right seventh rib metastasis with an SUV of 5.6, and L1 metastasis with an SUV of 11.7, and most importantly a hypermetabolic lytic lesion involving the anterior column of the right acetabulum with an SUV max of 12.3. This is associated with a pathologic fracture through the anterior cortex. I call Brooke [name] to come in today to discuss the results.  REVIEW OF SYSTEMS: Brooke [name] generally feels well. The pain in the right rib cage area is mild and intermittent. She does not have any low back pain or right hip pain associated with the recent findings. There have been no unusual headaches, visual changes, dizziness, gait imbalance, nausea, or vomiting. A detailed review of systems today was otherwise noncontributory  PAST MEDICAL HISTORY: Past Medical History  Diagnosis Date  . Hx Breast Cancer, IDC, Stahe III, receptor + 10/20/2010    left breast  . Breast cancer December 2010    invasive ductal and invasive lobular; bilateral mastectomy and radiation  1. Remote migraines, currently inactive.   2. History of prior diagnosis of fibromyalgia made at [facility] and currently not active (she was treated with trimethoprim and nortriptyline for one year for this).    3. History of recurrent UTIs.   4. History of benign left breast cyst aspirated in 2004 at [facility] under Dr. Christain Sacramento.   5. History of tonsillectomy and adenoidectomy.   6. History of right LASEK surgery. 7. History of bilateral reduction mammoplasties in 1994.   8. History of mitral valve prolapse diagnosed in 1984.  PAST SURGICAL HISTORY: Past Surgical History  Procedure Laterality Date  . Mastectomy Bilateral 2011    left breast cancer    FAMILY HISTORY Family History  Problem Relation Age of Onset  . Lung cancer Father 61    smoker for 25 years  . Heart disease Maternal Grandmother   . Heart disease Maternal Grandfather   . Diabetes type I Mother   . Diabetes type I Sister   . Diabetes type I Sister   . Heart disease Maternal Uncle   . Uterine cancer Paternal Grandmother 77  . Breast cancer Other 8    paternal great grandmother  [name] died at the age of 19 from lung cancer.  He had been a remote smoker and had been in [employer] with a question of possible asbestos exposure.  [name] died at the age of 58 in the setting of Alzheimer's, coronary artery disease, diabetes and hypertension.  The patient had two sisters.  One died from complications of diabetes.  The other one is alive with diabetes.    GYNECOLOGIC HISTORY: She is GX P1.  First pregnancy to term at age 7.  The patient's last period was in 2008.  She took hormone replacement for about 18 months until mid-2008.  She had no complications from that treatment.    SOCIAL HISTORY: She used to be a Associate Professor for [employer].  They used to live in the [place] area. 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  Her husband, Brooke [name], retired October 2012.  He was a Scientific laboratory technician for 28 years.  Their son [name], is currently with the [employer] at the [employer] in [place]. [name].  She attends [employer].   ADVANCED DIRECTIVES: in place  HEALTH MAINTENANCE: History  Substance Use Topics  .  Smoking status: Former Smoker -- 1.00 packs/day for 11 years    Quit date: 01/23/1984  . Smokeless tobacco: Not on file  . Alcohol Use: Yes     Comment: wine, 3x/week     Colonoscopy: 2010  PAP: UTD/Miller  Bone density: 03/2009  Lipid panel: per Dr Valentina Lucks  Allergies  Allergen Reactions  . Sulfa Antibiotics Shortness Of Breath  . Codeine Nausea And Vomiting    Current Outpatient Prescriptions  Medication Sig Dispense Refill  . Calcium Citrate-Vitamin D (CITRACAL/VITAMIN D PO) Take by mouth daily.      . cephALEXin (KEFLEX) 500 MG capsule Take 1 capsule (500 mg total) by mouth 2 (two) times daily. Take QID for 7 days.  14 capsule  0  . ciprofloxacin (CIPRO) 500 MG tablet Take 1 tablet (500 mg total) by mouth 2 (two) times daily.  14 tablet  0  . fluconazole (DIFLUCAN) 150 MG tablet Take 1 tablet (150 mg total) by mouth once.  1 tablet  0  .  gabapentin (NEURONTIN) 300 MG capsule TAKE 1 CAPSULE (300 MG TOTAL) BY MOUTH AT BEDTIME.  90 capsule  1  . letrozole (FEMARA) 2.5 MG tablet Take 1 tablet (2.5 mg total) by mouth daily.  90 tablet  3  . LORazepam (ATIVAN) 0.5 MG tablet TAKE 1 TABLET BY MOUTH 2 TIMES DAILY FOR ANXIETY  60 tablet  2  . valACYclovir (VALTREX) 500 MG tablet Take 500 mg by mouth as needed.      . venlafaxine XR (EFFEXOR-XR) 37.5 MG 24 hr capsule Take 1 capsule (37.5 mg total) by mouth 2 (two) times daily.  180 capsule  3  . Zoledronic Acid (RECLAST IV) Inject into the vein every 6 (six) months.       No current facility-administered medications for this visit.    OBJECTIVE: Middle-aged white woman who appears appropriately concerned  Filed Vitals:   11/05/12 1526  BP: 127/80  Pulse: 84  Temp: 97.4 F (36.3 C)  Resp: 20     Body mass index is 30.01 kg/(m^2).    ECOG FS: 1 Filed Weights   11/05/12 1526  Weight: 174 lb 14.4 oz (79.334 kg)    Sclerae unicteric Oropharynx clear No cervical or supraclavicular adenopathy Lungs no rales or rhonchi, good  excursion bilaterally Heart regular rate and rhythm Abdomen soft, nontender, positive bowel sounds MSK no focal spinal tenderness including over the lumbar area, no peripheral lymphedema; normal straight leg raising bilaterally Neuro: nonfocal, well oriented, tearful but controlled affect Breasts: deferred  LAB RESULTS: Lab Results  Component Value Date   WBC 6.4 07/22/2012   NEUTROABS 3.9 07/22/2012   HGB 14.4 07/22/2012   HCT 40.8 07/22/2012   MCV 86.3 07/22/2012   PLT 244 07/22/2012      Chemistry      Component Value Date/Time   NA 137 07/22/2012 0817   NA 142 06/26/2011 0728   NA 143 12/12/2010 1325   K 4.0 07/22/2012 0817   K 4.6 06/26/2011 0728   K 4.7 12/12/2010 1325   CL 105 01/07/2012 1347   CL 102 06/26/2011 0728   CL 105 12/12/2010 1325   CO2 25 07/22/2012 0817   CO2 30 06/26/2011 0728   CO2 27 12/12/2010 1325   BUN 10.5 07/22/2012 0817   BUN 12 06/26/2011 0728   BUN 14 12/12/2010 1325   CREATININE 0.9 07/22/2012 0817   CREATININE 1.1 06/26/2011 0728   CREATININE 0.96 12/12/2010 1325      Component Value Date/Time   CALCIUM 10.0 07/22/2012 0817   CALCIUM 9.1 06/26/2011 0728   CALCIUM 10.1 12/12/2010 1325   ALKPHOS 66 07/22/2012 0817   ALKPHOS 54 12/12/2010 1325   AST 18 07/22/2012 0817   AST 19 12/12/2010 1325   ALT 16 07/22/2012 0817   ALT 19 12/12/2010 1325   BILITOT 0.48 07/22/2012 0817   BILITOT 0.3 12/12/2010 1325       Lab Results  Component Value Date   LABCA2 21 01/07/2012    STUDIES: Nm Pet Image Restag (ps) Skull Base To Thigh  11/05/2012   CLINICAL DATA:  Restaging treatment strategy for breast cancer.  EXAM: NUCLEAR MEDICINE PET SKULL BASE TO THIGH  FASTING BLOOD GLUCOSE:  Value:  84 mg/dl  TECHNIQUE: 87.5 mCi I-43 FDG was injected intravenously. CT data was obtained and used for attenuation correction and anatomic localization only. (This was not acquired as a diagnostic CT examination.) Additional exam technical data entered on technologist worksheet.  COMPARISON:   02/01/2009  FINDINGS:  NECK  No hypermetabolic lymph nodes in the neck.  CHEST  Previous bilateral mastectomy. Left axillary lymph node dissection has been performed. No hypermetabolic pulmonary nodule or mass identified. There is no adenopathy noted within the chest.  ABDOMEN/PELVIS  No abnormal hypermetabolic activity within the liver, pancreas, adrenal glands, or spleen. No hypermetabolic lymph nodes in the abdomen or pelvis.  SKELETON  Multi focal hypermetabolic bone metastases are identified. Right 7th rib metastasis is identified. This has an SUV max equal to 5.6, image 97. Hypermetabolic metastasis to the L1 vertebra is identified. This has an SUV max equal to 11.7. Hypermetabolic lytic lesion involving the anterior column of the right acetabulum is identified. There is an associated pathologic fracture through the anterior cortex. This measures approximately 3.2 cm and has an SUV max equal to 12.3. The location and destructive nature of this particular lesion may be of orthopedic significance.  IMPRESSION: 1. 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 Of this is new since the previous examination.  2. Pathologic fracture involving the anterior column of the right acetabulum may be a of orthopedic significance.  3.  No extra osseous metastatic disease identified   Electronically Signed   By: Signa Kell M.D.   On: 11/05/2012 11:49     ASSESSMENT: 62 y.o.  woman   (1)  status post Left breast and Left axillary lymph node biopsy Dec 2010 for a cT3 pN1 (stage IIIA) invasive ductal carcinoma, grade 2, estrogen and progesterone receptor positive, HER-2 negative, with an MIB-1 of 44%;   (2)  treated neoadjuvantly with dose dense cyclophosphamide and doxorubicin x4 followed by weekly paclitaxel x7  (3) s/p bilateral mastectomies May 2011. She had a residual 1.5 cm tumor, and one of 8 sampled lymph nodes was positive [ypT1c ypN1]. Margins were negative.    (4)  She completed radiation in August of 2011   (5) on letrozole since August 2011  (6) PET scan 11/05/2012 obtained to evaluate right chest wall pain documents bony metastatic disease to the right seventh rib, L1, and the right acetabulum with an associated pathologic fracture through the anterior cortex of the acetabulum measuring 3.2 cm. There was no evidence of extra osseous metastatic disease   PLAN:  We spent approximately an hour today discussing the fact that that now seems to have stage IV disease. She understands this is not curable. It is however very treatable. She understands the goal of treatment is control.  We are stopping the letrozole. We are starting fulvestrant today. She will receive 500 mg today, 2 weeks from today, and 4 weeks from today. We will then continue on an every 4 week basis until there is evidence of disease progression. Similarly, we are intensifying the zolendronate, which she will receive today and monthly indefinitely. I suggested that she take some TUMS on the day of zolendronate to alleviate some of the flulike symptoms she has experienced in the past with that medication.  She will see [place] again in 2 weeks and then later after a month or so. Before she sees me again in January we will repeat a PET scan. She understands that sometimes some minor lesions which may not have been visible in the original PET scan may show up with healing once the zolendronate is increased. In that sense the new PET scan 3 months from now will serve as a new baseline.  I am concerned regarding a pathologic fracture in her right acetabulum. I have contacted Dr.  Aluisio and he is going to evaluate Brooke [name] on 11/07/2012. In the meantime I have suggested she stay off her right leg as much as possible, and avoid any activities that might end up in the fall or trauma to that area. I have also set her up with Dr. Michell Heinrich to receive radiation to the right seventh rib cage area, where she is  symptomatic, and to consider whether or, assuming she has right hip surgery, radiation at some point to that area might be beneficial.  Pattern fell will return to see [place] in 2 weeks. They know to call for any problems that may develop before that visit.  MAGRINAT,GUSTAV C    11/05/2012

## 2012-11-05 NOTE — Patient Instructions (Signed)
Fulvestrant injection What is this medicine? FULVESTRANT (ful VES trant) blocks the effects of estrogen. It is used to treat breast cancer in women past the age of menopause. This medicine may be used for other purposes; ask your health care provider or pharmacist if you have questions. What should I tell my health care provider before I take this medicine? They need to know if you have any of these conditions: -bleeding problems -liver disease -low levels of platelets in the blood -an unusual or allergic reaction to fulvestrant, other medicines, foods, dyes, or preservatives -pregnant or trying to get pregnant -breast-feeding How should I use this medicine? This medicine is for injection into a muscle. It is usually given by a health care professional in a hospital or clinic setting. Talk to your pediatrician regarding the use of this medicine in children. Special care may be needed. Overdosage: If you think you have taken too much of this medicine contact a poison control center or emergency room at once. NOTE: This medicine is only for you. Do not share this medicine with others. What if I miss a dose? It is important not to miss your dose. Call your doctor or health care professional if you are unable to keep an appointment. What may interact with this medicine? -medicines that treat or prevent blood clots like warfarin, enoxaparin, and dalteparin This list may not describe all possible interactions. Give your health care provider a list of all the medicines, herbs, non-prescription drugs, or dietary supplements you use. Also tell them if you smoke, drink alcohol, or use illegal drugs. Some items may interact with your medicine. What should I watch for while using this medicine? Your condition will be monitored carefully while you are receiving this medicine. You will need important blood work done while you are taking this medicine. Do not become pregnant while taking this medicine.  Women should inform their doctor if they wish to become pregnant or think they might be pregnant. There is a potential for serious side effects to an unborn child. Talk to your health care professional or pharmacist for more information. What side effects may I notice from receiving this medicine? Side effects that you should report to your doctor or health care professional as soon as possible: -allergic reactions like skin rash, itching or hives, swelling of the face, lips, or tongue -feeling faint or lightheaded, falls -fever or flu-like symptoms -sore throat -vaginal bleeding Side effects that usually do not require medical attention (report to your doctor or health care professional if they continue or are bothersome): -aches, pains -constipation or diarrhea -headache -hot flashes -nausea, vomiting -pain at site where injected -stomach pain This list may not describe all possible side effects. Call your doctor for medical advice about side effects. You may report side effects to FDA at 1-800-FDA-1088. Where should I keep my medicine? This drug is given in a hospital or clinic and will not be stored at home. NOTE: This sheet is a summary. It may not cover all possible information. If you have questions about this medicine, talk to your doctor, pharmacist, or health care provider.  2013, Elsevier/Gold Standard. (05/19/2007 3:39:24 PM) Zoledronic Acid injection (Hypercalcemia, Oncology) What is this medicine? ZOLEDRONIC ACID (ZOE le dron ik AS id) lowers the amount of calcium loss from bone. It is used to treat too much calcium in your blood from cancer. It is also used to prevent complications of cancer that has spread to the bone. This medicine may be used for  other purposes; ask your health care provider or pharmacist if you have questions. What should I tell my health care provider before I take this medicine? They need to know if you have any of these conditions: -aspirin-sensitive  asthma -dental disease -kidney disease -an unusual or allergic reaction to zoledronic acid, other medicines, foods, dyes, or preservatives -pregnant or trying to get pregnant -breast-feeding How should I use this medicine? This medicine is for infusion into a vein. It is given by a health care professional in a hospital or clinic setting. Talk to your pediatrician regarding the use of this medicine in children. Special care may be needed. Overdosage: If you think you have taken too much of this medicine contact a poison control center or emergency room at once. NOTE: This medicine is only for you. Do not share this medicine with others. What if I miss a dose? It is important not to miss your dose. Call your doctor or health care professional if you are unable to keep an appointment. What may interact with this medicine? -certain antibiotics given by injection -NSAIDs, medicines for pain and inflammation, like ibuprofen or naproxen -some diuretics like bumetanide, furosemide -teriparatide -thalidomide This list may not describe all possible interactions. Give your health care provider a list of all the medicines, herbs, non-prescription drugs, or dietary supplements you use. Also tell them if you smoke, drink alcohol, or use illegal drugs. Some items may interact with your medicine. What should I watch for while using this medicine? Visit your doctor or health care professional for regular checkups. It may be some time before you see the benefit from this medicine. Do not stop taking your medicine unless your doctor tells you to. Your doctor may order blood tests or other tests to see how you are doing. Women should inform their doctor if they wish to become pregnant or think they might be pregnant. There is a potential for serious side effects to an unborn child. Talk to your health care professional or pharmacist for more information. You should make sure that you get enough calcium and  vitamin D while you are taking this medicine. Discuss the foods you eat and the vitamins you take with your health care professional. Some people who take this medicine have severe bone, joint, and/or muscle pain. This medicine may also increase your risk for a broken thigh bone. Tell your doctor right away if you have pain in your upper leg or groin. Tell your doctor if you have any pain that does not go away or that gets worse. What side effects may I notice from receiving this medicine? Side effects that you should report to your doctor or health care professional as soon as possible: -allergic reactions like skin rash, itching or hives, swelling of the face, lips, or tongue -anxiety, confusion, or depression -breathing problems -changes in vision -feeling faint or lightheaded, falls -jaw burning, cramping, pain -muscle cramps, stiffness, or weakness -trouble passing urine or change in the amount of urine Side effects that usually do not require medical attention (report to your doctor or health care professional if they continue or are bothersome): -bone, joint, or muscle pain -fever -hair loss -irritation at site where injected -loss of appetite -nausea, vomiting -stomach upset -tired This list may not describe all possible side effects. Call your doctor for medical advice about side effects. You may report side effects to FDA at 1-800-FDA-1088. Where should I keep my medicine? This drug is given in a hospital or clinic  and will not be stored at home. NOTE: This sheet is a summary. It may not cover all possible information. If you have questions about this medicine, talk to your doctor, pharmacist, or health care provider.  2013, Elsevier/Gold Standard. (07/07/2010 9:06:58 AM)

## 2012-11-05 NOTE — Telephone Encounter (Signed)
Urgent request for consult peer-peer arranged with Dr Darnelle Catalan and Dr Lequita Halt.  Called office and obtained new patient appt for Friday 11/07/12 at 330. Patient to arrive at 3pm. Called and had CD with PET to take to appt. Last office notes from today and PET results faxed to [phone].

## 2012-11-07 ENCOUNTER — Telehealth: Payer: Self-pay | Admitting: Oncology

## 2012-11-07 ENCOUNTER — Other Ambulatory Visit: Payer: Self-pay | Admitting: *Deleted

## 2012-11-07 ENCOUNTER — Ambulatory Visit: Payer: Medicare Other | Admitting: Oncology

## 2012-11-07 NOTE — Telephone Encounter (Signed)
S/w pt re next appt for 10/29 lb/fu/inj and appt w/Dr. Michell Heinrich 10/23. Pt to get new schedule when she comes in 10/23. Dates for appts schedule as requested per 10/15 pof #s 1&2. Confirmed correct zometa inf dates w/desk nurse (11/12 and monthly).

## 2012-11-10 ENCOUNTER — Encounter: Payer: Self-pay | Admitting: Oncology

## 2012-11-10 ENCOUNTER — Other Ambulatory Visit: Payer: Self-pay | Admitting: Oncology

## 2012-11-10 ENCOUNTER — Telehealth: Payer: Self-pay | Admitting: *Deleted

## 2012-11-10 NOTE — Telephone Encounter (Signed)
Patient caled regarding her appt for 10/15. Patient was here and had treatment. Computer states that she was a no show. I have corrected the computer and notified the patient.

## 2012-11-11 ENCOUNTER — Encounter: Payer: Self-pay | Admitting: *Deleted

## 2012-11-12 ENCOUNTER — Telehealth: Payer: Self-pay | Admitting: Oncology

## 2012-11-12 ENCOUNTER — Telehealth: Payer: Self-pay | Admitting: Obstetrics & Gynecology

## 2012-11-12 ENCOUNTER — Encounter: Payer: Self-pay | Admitting: Radiation Oncology

## 2012-11-12 NOTE — Telephone Encounter (Signed)
Brooke [name], Can you pull chart for me and put on my desk?  I did review MRI which has been scanned into EP[facility]. She has hx of breast cancer diagnosed 2011.  Now with metastatic bone disease and had a pathologic hip fracture.  She knows all of this and is going to [facility] for second opinion.  Please let he know I did review MRI.  Nabothian cysts are benign cyst within the body of the cervix.  Very common and usually diagnosed on ultrasound/CT/MRI.  Most often, I cannot see or feel them.  They do not cause any issues.  Thank her for letting me know and please let he know I'm aware of what is going on in regards to her breast cancer and if I can do anything for her, please, please ask her to let me know.  Thank you.

## 2012-11-12 NOTE — Telephone Encounter (Signed)
Chart to your desk.   Message from Dr. [name] given and patient verbalized understanding. She expressed her gratitude and will call if she needs any follow up.

## 2012-11-12 NOTE — Telephone Encounter (Signed)
Faxed pt medical records to [facility]

## 2012-11-12 NOTE — Progress Notes (Signed)
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  Past/Anticipated interventions by surgeon, if ZOX:[id number] mastectomy on 06/13/2009  Past/Anticipated interventions by medical oncology, if any: Chemotherapy:Neoadjuvant chemo of cyclophosphamide and doxorubicin and paclitaxel.took letrozole for 3 years. Current treatment to receive fulvestrant every 4 weeks and zolendronate monthly   Lymphedema issues, if any:No  Pain issues, if any: Yes states right rib cage pain is "6" and right groin pain is "8" on scale of 0 to 10.  SAFETY ISSUES:  Prior radiation? Yes, completed dose of 60.4 Gy to left chestwall on 09/08/2009.  Pacemaker/ICD? no  Possible current pregnancy?yes  Is the patient on methotrexate? no  Current Complaints / other details: Patient is being referred to [facility] for pathologic fracture of right acetabulum and to Dr.Wentworth for consideration of palliative radiation of right seventh rib cage.Patient states Dr.Alusio reccommends no surgery just healing over time.    Tessa Lerner, RN 11/12/2012,11:24 AM

## 2012-11-12 NOTE — Telephone Encounter (Signed)
Dr. [name],  Patient is calling because she had a recent MRI of the R hip done through [facility]. Noted on the MRI was a Nabothian cyst. She would like you to be aware and was wondering if she needed care or any follow up for this? She would like you to look at MRI. Patient specifically requested that you be aware of message.

## 2012-11-12 NOTE — Telephone Encounter (Signed)
Patient is calling because she wants to talk about a MRI she had done.

## 2012-11-13 ENCOUNTER — Ambulatory Visit
Admission: RE | Admit: 2012-11-13 | Discharge: 2012-11-13 | Disposition: A | Discharge status: Home / Self Care | Payer: Medicare Other | Source: Ambulatory Visit | Attending: Radiation Oncology | Admitting: Radiation Oncology

## 2012-11-13 VITALS — BP 130/74 | HR 80 | Temp 98.2°F | Wt 175.1 lb

## 2012-11-13 DIAGNOSIS — Z853 Personal history of malignant neoplasm of breast: Secondary | ICD-10-CM

## 2012-11-13 DIAGNOSIS — C7951 Secondary malignant neoplasm of bone: Secondary | ICD-10-CM | POA: Insufficient documentation

## 2012-11-13 DIAGNOSIS — Z170 Estrogen receptor positive status [ER+]: Secondary | ICD-10-CM | POA: Insufficient documentation

## 2012-11-13 DIAGNOSIS — C50919 Malignant neoplasm of unspecified site of unspecified female breast: Secondary | ICD-10-CM | POA: Insufficient documentation

## 2012-11-13 HISTORY — DX: Reserved for inherently not codable concepts without codable children: IMO0001

## 2012-11-13 HISTORY — DX: Nonrheumatic mitral (valve) prolapse: I34.1

## 2012-11-13 HISTORY — DX: Reserved for concepts with insufficient information to code with codable children: IMO0002

## 2012-11-13 HISTORY — DX: Personal history of urinary (tract) infections: Z87.440

## 2012-11-13 NOTE — Progress Notes (Addendum)
Department of Radiation Oncology  Phone:  [phone] Fax:        [phone]   Name: Brooke [name] MRN: [id number]  DOB: 09/29/1950  Date: 11/13/2012  Follow Up Visit Note  Diagnosis: Metastatic breast cancer to bone  Summary and Interval since last radiation: Radiation to the left chest wall, and supraclavicular fossa completed 09/08/2009  Interval History: Brooke [name] presents today for followup.  She fell on a trip to [place] and noticed some right hip pain. She was seen by orthopedics and was told it was musculoskeletal. It wasn't really bothering her much so she didn't think much about it. She then start experiencing right rib pain and contacted Dr. Darnelle Catalan who ordered a PET scan. This was performed on 11/05/2012 and showed multiple areas of hypermetabolic activity in the bone including the right seventh rib, L1 vertebral body, and right acetabulum. There was an associated pathologic fracture of the right acetabulum. With this information she saw Dr. Darnelle Catalan who discontinued her Femara and switched her to fulvestrant. She also received her first Zometa injection last week. She was referred to orthopedic surgery where an MRI of the right hip was performed on 11/08/2012. This showed a nondisplaced right acetabular fracture with possible metastatic disease to the right pubic ramus. A 1.1 cm lesion was noted in the right posterior ilium at the sacroiliac joint as well as 2 lesions measuring 1.1 and 0.9 cm in the right femoral neck. Another 0.7 cm lesion was noted at the base of the right femoral neck. And a 5 mm lesion was noted in the medial proximal right femoral shaft. A 1.2 cm lesion was noted in the left femoral intertrochanteric region. She was not felt to require surgical intervention for these lesions at this time and was referred to me for consideration of palliative radiation. She states her pain is controlled with ibuprofen alone. Her right rib cage pain is rated on a 6 and her  right groin pain as an 8. She has pain when she walks. She also states she has stiffness when she first arises from a seating position but they can work that out over time. She is not interested in anything stronger than ibuprofen at this time. She has sought a second opinion regarding chemotherapy at [facility] and has not pending in the next few weeks. She denies any headaches. She denies any back pain. She denies any weight loss.  Allergies:  Allergies  Allergen Reactions  . Sulfa Antibiotics Shortness Of Breath  . Codeine Nausea And Vomiting    Medications:  Current Outpatient Prescriptions  Medication Sig Dispense Refill  . Calcium Citrate-Vitamin D (CITRACAL/VITAMIN D PO) Take by mouth daily.      . fulvestrant (FASLODEX) 250 MG/5ML injection Inject 250 mg into the muscle every 30 (thirty) days. One injection each buttock over 1-2 minutes. Warm prior to use.      . gabapentin (NEURONTIN) 300 MG capsule TAKE 1 CAPSULE (300 MG TOTAL) BY MOUTH AT BEDTIME.  90 capsule  1  . LORazepam (ATIVAN) 0.5 MG tablet TAKE 1 TABLET BY MOUTH 2 TIMES DAILY FOR ANXIETY  60 tablet  2  . valACYclovir (VALTREX) 500 MG tablet Take 500 mg by mouth as needed.      . venlafaxine XR (EFFEXOR-XR) 37.5 MG 24 hr capsule Take 1 capsule (37.5 mg total) by mouth 2 (two) times daily.  180 capsule  3  . Zoledronic Acid (RECLAST IV) Inject into the vein every 6 (six) months.      [name] Kitchen  letrozole (FEMARA) 2.5 MG tablet Take 1 tablet (2.5 mg total) by mouth daily.  90 tablet  3   No current facility-administered medications for this encounter.    Physical Exam:  Filed Vitals:   11/13/12 1047  BP: 130/74  Pulse: 80  Temp: 98.2 F (36.8 C)   she is a pleasant female in no distress sitting comfortably on examining table. She has some point tenderness to her right hip. She does have some stiffness when she first starts walking but then is able to maintain a torque normal gait. She has no cranial nerve deficits.  She is alert and oriented x3.  IMPRESSION: Brooke [name] is a 62 y.o. female status post mastectomy, chemotherapy, adjuvant radiation and hormonal therapy with newly diagnosed metastatic disease to bone only  PLAN:  I discussed with Brooke Bible and her husband her diagnosis and we discussed a little bit of prognosis. We discussed the tail on the survival curve at 10 years for patients who are estrogen receptor positive and have bone only disease. We discussed the more chronic nature of her breast cancer and the goals of care including treating her with as minimal side effects as possible wall maximizing her activity levels. We discussed 10-15 fractions of radiation to her bilateral hip and rib. Although she is not really having much pain on the left side the presence of a metastases in the femur showed a grow would put her at risk for pathologic fracture. We have put off her simulation until next week at her request. She has another appointment scheduled in medical oncology on Wednesdays I scheduled her simulation at that time as well. Other than some minimal skin redness along her rib lesion as it is posterior her skin is really not much in terms of side effects as far as the others go. She has signed informed consent and agree to proceed forward.    Lurline Hare, MD

## 2012-11-13 NOTE — Addendum Note (Signed)
Encounter addended by: Tessa Lerner, RN on: 11/13/2012  4:07 PM<BR>     Documentation filed: Charges VN

## 2012-11-13 NOTE — Progress Notes (Signed)
Please see the Nurse Progress Note in the MD Initial Consult Encounter for this patient. 

## 2012-11-18 ENCOUNTER — Other Ambulatory Visit: Payer: Self-pay | Admitting: Physician Assistant

## 2012-11-18 DIAGNOSIS — Z853 Personal history of malignant neoplasm of breast: Secondary | ICD-10-CM

## 2012-11-19 ENCOUNTER — Encounter: Payer: Self-pay | Admitting: Physician Assistant

## 2012-11-19 ENCOUNTER — Ambulatory Visit (HOSPITAL_BASED_OUTPATIENT_CLINIC_OR_DEPARTMENT_OTHER): Payer: Medicare Other

## 2012-11-19 ENCOUNTER — Other Ambulatory Visit: Payer: Self-pay | Admitting: Oncology

## 2012-11-19 ENCOUNTER — Ambulatory Visit (HOSPITAL_BASED_OUTPATIENT_CLINIC_OR_DEPARTMENT_OTHER): Payer: Medicare Other | Admitting: Physician Assistant

## 2012-11-19 ENCOUNTER — Telehealth: Payer: Self-pay | Admitting: Oncology

## 2012-11-19 ENCOUNTER — Other Ambulatory Visit (HOSPITAL_BASED_OUTPATIENT_CLINIC_OR_DEPARTMENT_OTHER): Payer: Medicare Other | Admitting: Lab

## 2012-11-19 ENCOUNTER — Ambulatory Visit
Admission: RE | Admit: 2012-11-19 | Discharge: 2012-11-19 | Disposition: A | Discharge status: Home / Self Care | Payer: Medicare Other | Source: Ambulatory Visit | Attending: Radiation Oncology | Admitting: Radiation Oncology

## 2012-11-19 VITALS — BP 127/78 | HR 81 | Temp 98.0°F | Resp 18 | Ht 64.0 in | Wt 175.3 lb

## 2012-11-19 DIAGNOSIS — Z510 Encounter for antineoplastic radiation therapy: Secondary | ICD-10-CM | POA: Insufficient documentation

## 2012-11-19 DIAGNOSIS — C50919 Malignant neoplasm of unspecified site of unspecified female breast: Secondary | ICD-10-CM

## 2012-11-19 DIAGNOSIS — R079 Chest pain, unspecified: Secondary | ICD-10-CM | POA: Insufficient documentation

## 2012-11-19 DIAGNOSIS — C7951 Secondary malignant neoplasm of bone: Secondary | ICD-10-CM

## 2012-11-19 DIAGNOSIS — Z5111 Encounter for antineoplastic chemotherapy: Secondary | ICD-10-CM

## 2012-11-19 DIAGNOSIS — C773 Secondary and unspecified malignant neoplasm of axilla and upper limb lymph nodes: Secondary | ICD-10-CM

## 2012-11-19 DIAGNOSIS — Z853 Personal history of malignant neoplasm of breast: Secondary | ICD-10-CM

## 2012-11-19 DIAGNOSIS — R0781 Pleurodynia: Secondary | ICD-10-CM | POA: Insufficient documentation

## 2012-11-19 DIAGNOSIS — R51 Headache: Secondary | ICD-10-CM | POA: Insufficient documentation

## 2012-11-19 DIAGNOSIS — M25559 Pain in unspecified hip: Secondary | ICD-10-CM | POA: Insufficient documentation

## 2012-11-19 DIAGNOSIS — R1011 Right upper quadrant pain: Secondary | ICD-10-CM | POA: Insufficient documentation

## 2012-11-19 DIAGNOSIS — R110 Nausea: Secondary | ICD-10-CM | POA: Insufficient documentation

## 2012-11-19 DIAGNOSIS — M25551 Pain in right hip: Secondary | ICD-10-CM | POA: Insufficient documentation

## 2012-11-19 DIAGNOSIS — Z170 Estrogen receptor positive status [ER+]: Secondary | ICD-10-CM

## 2012-11-19 LAB — COMPREHENSIVE METABOLIC PANEL (CC13)
ALT: 16 U/L (ref 0–55)
AST: 19 U/L (ref 5–34)
Albumin: 3.6 g/dL (ref 3.5–5.0)
Alkaline Phosphatase: 76 U/L (ref 40–150)
Potassium: 4.3 mEq/L (ref 3.5–5.1)
Sodium: 140 mEq/L (ref 136–145)
Total Protein: 6.8 g/dL (ref 6.4–8.3)

## 2012-11-19 LAB — CBC WITH DIFFERENTIAL/PLATELET
BASO%: 0.8 % (ref 0.0–2.0)
EOS%: 4.1 % (ref 0.0–7.0)
Eosinophils Absolute: 0.3 10*3/uL (ref 0.0–0.5)
MCH: 29.8 pg (ref 25.1–34.0)
MCV: 89.7 fL (ref 79.5–101.0)
MONO%: 5.6 % (ref 0.0–14.0)
NEUT#: 4 10*3/uL (ref 1.5–6.5)
RBC: 4.61 10*6/uL (ref 3.70–5.45)
RDW: 12.3 % (ref 11.2–14.5)

## 2012-11-19 MED ORDER — TRAMADOL HCL 50 MG PO TABS: 50.0000 mg | ORAL_TABLET | Freq: Four times a day (QID) | ORAL | Status: DC | PRN

## 2012-11-19 MED ORDER — FULVESTRANT 250 MG/5ML IM SOLN
500.0000 mg | Freq: Once | INTRAMUSCULAR | Status: AC
Start: 2012-11-19 — End: 2012-11-19
  Administered 2012-11-19: 500 mg via INTRAMUSCULAR
  Filled 2012-11-19: qty 10

## 2012-11-19 NOTE — Telephone Encounter (Signed)
, °

## 2012-11-19 NOTE — Progress Notes (Signed)
Name: Brooke [name]   MRN: [id number]  Date:  11/19/2012  DOB: 1950/03/20  Status:outpatient    DIAGNOSIS: Metastatic breast cancer to bone  CONSENT VERIFIED: yes   SET UP: Patient is setup supine   IMMOBILIZATION:  The following immobilization was used: vacloc (hips) and wingboard (ribs)  NARRATIVE:  Pt Rodenbeck was brought to the CT Simulation planning suite.  Identity was confirmed.  All relevant records and images related to the planned course of therapy were reviewed.  Then, the patient was positioned in a stable reproducible clinical set-up for radiation therapy.  CT images were obtained.  An isocenter was placed first for her ribs with utilization of her PET scan for target delineation. Skin markings were placed.  I then placed isocenters in her bilateral hips and skin markings were placed. The CT images were loaded into the planning software where the target and avoidance structures were contoured.  The radiation prescription was entered and confirmed. The patient was discharged in stable condition and tolerated simulation well.    TREATMENT PLANNING NOTE:  Treatment planning then occurred. I have requested : MLC's, isodose plan, basic dose calculation

## 2012-11-19 NOTE — Progress Notes (Signed)
ID: Brooke [name]   DOB: [birth year]  MR#: [id number]  WUJ#:[id number]  PCP: Lillia Mountain, MD GYN: SU:  OTHER MD: Lurline Hare, Ollen Gross  CHIEF COMPLAINT:  Metastatic Breast Cancer   HISTORY OF PRESENT ILLNESS: Brooke [name] had screening mammography in August 04, 2007 which showed no specific mammographic evidence of malignancy, but she reported a left breast lump.  Accordingly, she was brought back on August 11, 2007 for mammography and ultrasonography.  There was indeed an obscured mass in the upper left breast which by ultrasound appeared to be a simple cyst.    Screening mammogram on August 04, 2008 showed in addition to dense breasts, again a possible mass in the left breast.  She had diagnostic mammography August 06, 2008 and additional views in addition to very dense breasts did not show any persistent mass or distortion.  Furthermore, Dr. Manson Passey was not able to palpate any abnormality in the lateral portion of the left breast.  Ultrasound showed normal appearing fibroglandular tissue in the area in question.    More recently, about September, the patient felt again something like a lump in the left breast and she had some pain associated with this.  This was initially felt simply to be mastalgia and was treated as such, but as it persisted, on December 23rd, the patient had left diagnostic mammography and ultrasonography.  There was a focally dense area in the left breast with very minimal distortion corresponding to the area of palpable abnormality. The ultrasound showed an irregular ill-defined, hypoechoic area with distal shadowing, measuring approximately 1 cm corresponding to the patient's palpable abnormality.    With this information, the patient was brought back for ultrasound-guided core biopsy and this was performed December 28th.  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  There was no evidence of angiolymphatic  invasion in the material submitted which measured 1.5 cm maximally.  In addition to the mass in the breast, a lymph node in the left axilla was biopsied and was likewise positive.  The tumor was ER positive at 89%, PR positive at 34% with an elevated MIB-1 at 44% and Her-2 negative with a ratio of 1.13.    Her subsequent history is as detailed below.  INTERVAL HISTORY: Brooke [name] returns today with her husband Brooke [name] for followup of her breast cancer. Unfortunately, Brooke [name] was recently found to have stage IV disease, with bony metastatic disease affecting the right seventh rib, L1, and the right acetabulum. She was started on fulvestrant injections 2 weeks ago on 11/05/2012, and is also receiving zoledronic acid on a monthly basis. Her last dose of zoledronic acid was also given 2 weeks ago, 11/05/2012. She returns today for her second every 2 week loading dose of fulvestrant.  Since her last appointment here, she was evaluated by Dr. Wynne Dust with regards to the pathologic fracture through the anterior cortex of the acetabulum.  He obtained an MRI of the right hip which again confirmed multiple metastatic lesions. A do not yet have a copy of his office note, but Brooke [name] tells me today that he did not feel like surgery would be helpful. Subsequently, she is in the process of beginning radiation therapy. In fact, she scheduled for her for simulation with Dr. Michell Heinrich later this afternoon.  Brooke [name] continues to have some discomfort in the right hip. Since her last appointment here, however, she is also started to have significant pain in the right anterior rib cage. She's been taking  an occasional dose of Aleve with minimal relief. She is intolerant of hydrocodone and oxycodone, but has never taken tramadol to her knowledge.  REVIEW OF SYSTEMS: Otherwise, Brooke [name] generally feels well. She's had no fevers or chills. She denies any rashes, skin changes, abnormal bruising, or abnormal bleeding. She tolerated the zoledronic acid  well 2 weeks ago, and also tolerated the fulvestrant. She's had only occasional hot flashes, nothing she would consider problematic. She's had no increased bone pain other than that noted above. She's eating and drinking fairly well. She has some occasional nausea but no emesis. She denies any changes to bowel or bladder habits. She's had no new cough, increased shortness of breath, chest pain, palpitations, or peripheral swelling. She also denies any abnormal headaches, dizziness, or change in vision.  A detailed review of systems is otherwise stable and noncontributory.    PAST MEDICAL HISTORY: Past Medical History  Diagnosis Date  . Hx Breast Cancer, IDC, Stahe III, receptor + 10/20/2010    left breast  . Breast cancer December 2010    invasive ductal and invasive lobular; bilateral mastectomy and radiation  . Mitral valve prolapse   . History of recurrent UTIs   . Radiation 07/26/09-09/08/09    left breast  1. Remote migraines, currently inactive.   2. History of prior diagnosis of fibromyalgia made at [facility] and currently not active (she was treated with trimethoprim and nortriptyline for one year for this).    3. History of recurrent UTIs.   4. History of benign left breast cyst aspirated in 2004 at [facility] under Dr. Christain Sacramento.   5. History of tonsillectomy and adenoidectomy.   6. History of right LASEK surgery. 7. History of bilateral reduction mammoplasties in 1994.   8. History of mitral valve prolapse diagnosed in 1984.  PAST SURGICAL HISTORY: Past Surgical History  Procedure Laterality Date  . Mastectomy Bilateral 2011    left breast cancer  . Lasik    . Reduction mammaplasty Bilateral 1994  . Tonsillectomy and adenoidectomy    . Aspiration of left breast Left 2004    [facility]/Dr.Teo    FAMILY HISTORY Family History  Problem Relation Age of Onset  . Lung cancer Father 25    smoker for 25 years  . Heart disease  Maternal Grandmother   . Heart disease Maternal Grandfather   . Diabetes type I Mother   . Diabetes type I Sister   . Diabetes type I Sister   . Heart disease Maternal Uncle   . Uterine cancer Paternal Grandmother 38  . Breast cancer Other 7    paternal great grandmother  [name] died at the age of 80 from lung cancer.  He had been a remote smoker and had been in [employer] with a question of possible asbestos exposure.  [name] died at the age of 88 in the setting of Alzheimer's, coronary artery disease, diabetes and hypertension.  The patient had two sisters.  One died from complications of diabetes.  The other one is alive with diabetes.    GYNECOLOGIC HISTORY: She is GX P1.  First pregnancy to term at age 47.  The patient's last period was in 2008.  She took hormone replacement for about 18 months until mid-2008.  She had no complications from that treatment.    SOCIAL HISTORY: (Updated October 2014) She used to be a Associate Professor for [employer].  They used to live in the [place] area.  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  Her husband, Brooke [name], retired October 2012.  He was a Scientific laboratory technician for 28 years.  Their son [name], is currently with the [employer] at the [employer] in [place]. [name].  She attends [employer].   ADVANCED DIRECTIVES: in place  HEALTH MAINTENANCE: (Updated 11/19/2012) History  Substance Use Topics  . Smoking status: Former Smoker -- 1.00 packs/day for 11 years    Quit date: 01/23/1984  . Smokeless tobacco: Never Used  . Alcohol Use: Yes     Comment: wine, 3x/week     Colonoscopy: 2010  PAP: Jan 2014  Bone density: July 2014, osteopenia  Lipid panel: per Dr Boyce Medici  Allergies  Allergen Reactions  . Sulfa Antibiotics Shortness Of Breath  . Codeine Nausea And Vomiting    Current Outpatient Prescriptions  Medication Sig  Dispense Refill  . Calcium Citrate-Vitamin D (CITRACAL/VITAMIN D PO) Take by mouth daily.      [name] Kitchen gabapentin (NEURONTIN) 300 MG capsule TAKE 1 CAPSULE (300 MG TOTAL) BY MOUTH AT BEDTIME.  90 capsule  1  . LORazepam (ATIVAN) 0.5 MG tablet TAKE 1 TABLET BY MOUTH 2 TIMES DAILY FOR ANXIETY  60 tablet  2  . valACYclovir (VALTREX) 500 MG tablet Take 500 mg by mouth as needed.      . venlafaxine XR (EFFEXOR-XR) 37.5 MG 24 hr capsule Take 1 capsule (37.5 mg total) by mouth 2 (two) times daily.  180 capsule  3  . Zoledronic Acid (RECLAST IV) Inject into the vein every 6 (six) months.      . traMADol (ULTRAM) 50 MG tablet Take 1 tablet (50 mg total) by mouth every 6 (six) hours as needed for pain.  60 tablet  0   No current facility-administered medications for this visit.    OBJECTIVE: Middle-aged white [name] who appears slightly anxious but is in no acute distress Filed Vitals:   11/19/12 1310  BP: 127/78  Pulse: 81  Temp: 98 F (36.7 C)  Resp: 18     Body mass index is 30.08 kg/(m^2).    ECOG FS: 1 Filed Weights   11/19/12 1310  Weight: 175 lb 4.8 oz (79.516 kg)   Physical Exam: HEENT:  Sclerae anicteric.  Oropharynx clear. Good dentition. NODES:  No cervical or supraclavicular lymphadenopathy palpated.  BREAST EXAM: Deferred. Axillae are benign bilaterally with no palpable lymphadenopathy. LUNGS:  Clear to auscultation bilaterally.  No wheezes or rhonchi HEART:  Regular rate and rhythm. No murmur  ABDOMEN:  Soft, nontender.  Positive bowel sounds.  MSK:  No focal spinal tenderness to gentle palpation. Full range of motion in both the upper and lower extremities. There is some tenderness to palpation along the right anterior rib cage. EXTREMITIES:  No peripheral edema.   NEURO:  Nonfocal. Well oriented.  Appropriate affect.   LAB RESULTS: Lab Results  Component Value Date   WBC 6.0 11/19/2012   NEUTROABS 4.0 11/19/2012   HGB 13.7 11/19/2012   HCT 41.3 11/19/2012   MCV 89.7  11/19/2012   PLT 274 11/19/2012      Chemistry      Component Value Date/Time   NA 140 11/19/2012 1256   NA 142 06/26/2011 0728   NA 143 12/12/2010 1325   K 4.3 11/19/2012 1256   K 4.6 06/26/2011 0728   K 4.7 12/12/2010 1325   CL 105 01/07/2012 1347  CL 102 06/26/2011 0728   CL 105 12/12/2010 1325   CO2 25 11/19/2012 1256   CO2 30 06/26/2011 0728   CO2 27 12/12/2010 1325   BUN 14.0 11/19/2012 1256   BUN 12 06/26/2011 0728   BUN 14 12/12/2010 1325   CREATININE 0.9 11/19/2012 1256   CREATININE 1.1 06/26/2011 0728   CREATININE 0.96 12/12/2010 1325      Component Value Date/Time   CALCIUM 9.8 11/19/2012 1256   CALCIUM 9.1 06/26/2011 0728   CALCIUM 10.1 12/12/2010 1325   ALKPHOS 76 11/19/2012 1256   ALKPHOS 54 12/12/2010 1325   AST 19 11/19/2012 1256   AST 19 12/12/2010 1325   ALT 16 11/19/2012 1256   ALT 19 12/12/2010 1325   BILITOT 0.50 11/19/2012 1256   BILITOT 0.3 12/12/2010 1325       Lab Results  Component Value Date   LABCA2 21 01/07/2012    STUDIES: Nm Pet Image Restag (ps) Skull Base To Thigh  11/05/2012   CLINICAL DATA:  Restaging treatment strategy for breast cancer.  EXAM: NUCLEAR MEDICINE PET SKULL BASE TO THIGH  FASTING BLOOD GLUCOSE:  Value:  84 mg/dl  TECHNIQUE: 16.1 mCi W-96 FDG was injected intravenously. CT data was obtained and used for attenuation correction and anatomic localization only. (This was not acquired as a diagnostic CT examination.) Additional exam technical data entered on technologist worksheet.  COMPARISON:  02/01/2009  FINDINGS: NECK  No hypermetabolic lymph nodes in the neck.  CHEST  Previous bilateral mastectomy. Left axillary lymph node dissection has been performed. No hypermetabolic pulmonary nodule or mass identified. There is no adenopathy noted within the chest.  ABDOMEN/PELVIS  No abnormal hypermetabolic activity within the liver, pancreas, adrenal glands, or spleen. No hypermetabolic lymph nodes in the abdomen or pelvis.  SKELETON  Multi  focal hypermetabolic bone metastases are identified. Right 7th rib metastasis is identified. This has an SUV max equal to 5.6, image 97. Hypermetabolic metastasis to the L1 vertebra is identified. This has an SUV max equal to 11.7. Hypermetabolic lytic lesion involving the anterior column of the right acetabulum is identified. There is an associated pathologic fracture through the anterior cortex. This measures approximately 3.2 cm and has an SUV max equal to 12.3. The location and destructive nature of this particular lesion may be of orthopedic significance.  IMPRESSION: 1. 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 Of this is new since the previous examination.  2. Pathologic fracture involving the anterior column of the right acetabulum may be a of orthopedic significance.  3.  No extra osseous metastatic disease identified   Electronically Signed   By: Signa Kell M.D.   On: 11/05/2012 11:49     ASSESSMENT: 62 y.o. Brooke [name]   (1)  status post Left breast and Left axillary lymph node biopsy Dec 2010 for a cT3 pN1 (stage IIIA) invasive ductal carcinoma, grade 2, estrogen and progesterone receptor positive, HER-2 negative, with an MIB-1 of 44%;   (2)  treated neoadjuvantly with dose dense cyclophosphamide and doxorubicin x4 followed by weekly paclitaxel x7  (3) s/p bilateral mastectomies May 2011. She had a residual 1.5 cm tumor, and one of 8 sampled lymph nodes was positive [ypT1c ypN1]. Margins were negative.   (4)  She completed radiation in August of 2011   (5) on letrozole since August 2011  (6) PET scan 11/05/2012 obtained to evaluate right chest wall pain documents bony metastatic disease to the right seventh rib, L1, and  the right acetabulum with an associated pathologic fracture through the anterior cortex of the acetabulum measuring 3.2 cm. There was no evidence of extra osseous metastatic disease.  (7)  as of 11/05/2012, receiving  fulvestrant injections, 500 mg monthly, in addition to monthly infusions of zoledronic acid.   PLAN:  Overall, I believe Brooke [name] is coping with her new diagnosis quite well. She did tolerate treatment very well 2 weeks ago, and receive her second loading dose of fulvestrant today. She return in 2 weeks for her final every 2 week loading dose of fulvestrant, and from that point forward, the injections will be given on a monthly basis. We'll continue to give zoledronic acid every 4 weeks, the next dose due on November 12. I will see her that day as well, for repeat labs and physical exam.  In the meanwhile, she'll continue being followed by Dr. Doreen Beam and will receive radiation therapy as planned. Our plan overall is to continue with this regimen for 3 months, then repeat a PET scan in mid January. That has been scheduled accordingly, and she will see Dr. Darnelle Catalan soon after the repeat PET scan to review those results and discuss future treatment.  I have prescribed tramadol, 50 mg up to 4 times daily for [name] pain. She is very comfortable with the above plan, and she and Brooke [name] both voice understanding and agreement. They will call with any changes or problems.   Cheryl Chay PA-C    11/19/2012

## 2012-11-22 DIAGNOSIS — S329XXA Fracture of unspecified parts of lumbosacral spine and pelvis, initial encounter for closed fracture: Secondary | ICD-10-CM

## 2012-11-22 HISTORY — DX: Fracture of unspecified parts of lumbosacral spine and pelvis, initial encounter for closed fracture: S32.9XXA

## 2012-11-26 ENCOUNTER — Ambulatory Visit
Admission: RE | Admit: 2012-11-26 | Discharge: 2012-11-26 | Disposition: A | Discharge status: Home / Self Care | Payer: Medicare Other | Source: Ambulatory Visit | Attending: Radiation Oncology | Admitting: Radiation Oncology

## 2012-11-26 DIAGNOSIS — C50919 Malignant neoplasm of unspecified site of unspecified female breast: Secondary | ICD-10-CM

## 2012-11-26 NOTE — Progress Notes (Signed)
  Radiation Oncology         [phone] ________________________________  Name: Brooke [name] MRN: [id number]  Date: 11/26/2012  DOB: 04/13/1950  Simulation Verification Note  Status: outpatient  NARRATIVE: The patient was brought to the treatment unit and placed in the planned treatment position. The clinical setup was verified. Then port films were obtained and uploaded to the radiation oncology medical record software.  The treatment beams were carefully compared against the planned radiation fields. The position location and shape of the radiation fields was reviewed. The targeted volume of tissue appears appropriately covered by the radiation beams. Organs at risk appear to be excluded as planned.  Based on my personal review, I approved the simulation verification. I also verified the position of the electron cone on the patient on the machine. The patient's treatment will proceed as planned.  ------------------------------------------------  Lurline Hare, MD

## 2012-11-27 ENCOUNTER — Ambulatory Visit
Admission: RE | Admit: 2012-11-27 | Discharge: 2012-11-27 | Disposition: A | Discharge status: Home / Self Care | Payer: Medicare Other | Source: Ambulatory Visit | Attending: Radiation Oncology | Admitting: Radiation Oncology

## 2012-11-27 ENCOUNTER — Other Ambulatory Visit: Payer: Self-pay | Admitting: *Deleted

## 2012-11-27 ENCOUNTER — Ambulatory Visit: Payer: Medicare Other | Admitting: Radiation Oncology

## 2012-11-27 DIAGNOSIS — C50919 Malignant neoplasm of unspecified site of unspecified female breast: Secondary | ICD-10-CM

## 2012-11-27 MED ORDER — VENLAFAXINE HCL ER 37.5 MG PO CP24: 37.5000 mg | ORAL_CAPSULE | Freq: Two times a day (BID) | ORAL | Status: DC

## 2012-11-27 NOTE — Telephone Encounter (Signed)
EFFEXOR XR REFILL WAS CALLED TO [facility]. NOTIFIED [facility] ON [address].

## 2012-11-28 ENCOUNTER — Ambulatory Visit
Admission: RE | Admit: 2012-11-28 | Discharge: 2012-11-28 | Disposition: A | Discharge status: Home / Self Care | Payer: Medicare Other | Source: Ambulatory Visit | Attending: Radiation Oncology | Admitting: Radiation Oncology

## 2012-11-29 ENCOUNTER — Encounter: Payer: Self-pay | Admitting: Radiation Oncology

## 2012-12-01 ENCOUNTER — Ambulatory Visit
Admission: RE | Admit: 2012-12-01 | Discharge: 2012-12-01 | Disposition: A | Discharge status: Home / Self Care | Payer: Medicare Other | Source: Ambulatory Visit | Attending: Radiation Oncology | Admitting: Radiation Oncology

## 2012-12-02 ENCOUNTER — Encounter: Payer: Self-pay | Admitting: Radiation Oncology

## 2012-12-02 ENCOUNTER — Ambulatory Visit
Admission: RE | Admit: 2012-12-02 | Discharge: 2012-12-02 | Disposition: A | Discharge status: Home / Self Care | Payer: Medicare Other | Source: Ambulatory Visit | Attending: Radiation Oncology | Admitting: Radiation Oncology

## 2012-12-02 VITALS — BP 127/80 | HR 81 | Temp 98.1°F | Ht 64.0 in | Wt 176.0 lb

## 2012-12-02 DIAGNOSIS — C50919 Malignant neoplasm of unspecified site of unspecified female breast: Secondary | ICD-10-CM

## 2012-12-02 DIAGNOSIS — R300 Dysuria: Secondary | ICD-10-CM

## 2012-12-02 MED ORDER — RADIAPLEXRX EX GEL
Freq: Once | CUTANEOUS | Status: AC
  Administered 2012-12-02: 15:00:00 via TOPICAL

## 2012-12-02 MED ORDER — OXYCODONE HCL 5 MG PO TABS: 5.0000 mg | ORAL_TABLET | ORAL | Status: DC | PRN

## 2012-12-02 MED ORDER — PROCHLORPERAZINE MALEATE 10 MG PO TABS: 10.0000 mg | ORAL_TABLET | Freq: Four times a day (QID) | ORAL | Status: DC | PRN

## 2012-12-02 MED ORDER — MORPHINE SULFATE 4 MG/ML IJ SOLN
INTRAMUSCULAR | Status: AC
  Filled 2012-12-02: qty 1

## 2012-12-02 MED ORDER — MORPHINE SULFATE 4 MG/ML IJ SOLN
4.0000 mg | Freq: Once | INTRAMUSCULAR | Status: AC
  Administered 2012-12-02: 4 mg via INTRAMUSCULAR
  Filled 2012-12-02: qty 1

## 2012-12-02 MED ORDER — RADIAPLEXRX EX GEL
Freq: Once | CUTANEOUS | Status: AC
  Administered 2012-12-02: 20:00:00 via TOPICAL

## 2012-12-02 MED ORDER — MORPHINE SULFATE 15 MG PO TABS: ORAL_TABLET | ORAL | Status: DC

## 2012-12-02 NOTE — Progress Notes (Signed)
Brooke [name] has received 4 fractions to her right ribs and right and left ribs.  She c/o pain at a level 7 in her ribs.  She states she is not getting adequate relief of her pain from Tramadol and that it also nauseates her like codeine.l  She began to cry as she was reporting her discomfort.   Accompanied by her spouse.

## 2012-12-02 NOTE — Progress Notes (Signed)
Brooke [name] received 4 mg Morphine IM in the right ventro-gluteal region at 7:38pm for her level 7 pain in her right rib region and a bandaid was applied to the area..  Ordered by Dr. Lurline Hare.  She was discharged to home at &:49 pm and stated her pain level had decreased tom a level 6 on a scale of 0-10.  Urine collected for a U/A. A script for Compazine po and Oxycodone po given to patient Before leaving.

## 2012-12-02 NOTE — Addendum Note (Signed)
Encounter addended by: Delynn Flavin, RN on: 12/02/2012  8:31 PM<BR>     Documentation filed: Inpatient MAR, Orders

## 2012-12-02 NOTE — Progress Notes (Signed)
Weekly Management Note Current Dose: 10  Gy  Projected Dose: 37.5 Gy   Narrative:  The patient presents for routine under treatment assessment.  CBCT/MVCT images/Port film x-rays were reviewed.  The chart was checked. C/o pain worse today. No relieved with tramadol and palpable mass in her righ upper quadrant. No relief with BM (normal BM, no constipation). No dysuria or hematuria. No fever or chills.  Nausea with most pain medications. Baked a cake today.   Physical Findings: Weight: 176 lb (79.833 kg).Pt is tearful, apologizing.  Palpable, mobile mass on right abdomen laterally. No rebound. Below rib that was painful before. PET scan reviewed.   Impression:  The patient is tolerating radiation with new right upper quadrant pain.   Plan:  Continue treatment as planned. CT scan abdomen tomorrow. IM morphine x 1 now. Script given for oxyIR with compazine for nausea. U/a to check for UTI/blood for kidney stone.

## 2012-12-03 ENCOUNTER — Ambulatory Visit
Admission: RE | Admit: 2012-12-03 | Discharge: 2012-12-03 | Disposition: A | Discharge status: Home / Self Care | Payer: Medicare Other | Source: Ambulatory Visit | Attending: Radiation Oncology | Admitting: Radiation Oncology

## 2012-12-03 ENCOUNTER — Ambulatory Visit (HOSPITAL_BASED_OUTPATIENT_CLINIC_OR_DEPARTMENT_OTHER): Payer: Medicare Other | Admitting: Physician Assistant

## 2012-12-03 ENCOUNTER — Other Ambulatory Visit: Payer: Medicare Other | Admitting: Lab

## 2012-12-03 ENCOUNTER — Other Ambulatory Visit: Payer: Self-pay | Admitting: *Deleted

## 2012-12-03 ENCOUNTER — Ambulatory Visit: Payer: Medicare Other

## 2012-12-03 ENCOUNTER — Ambulatory Visit (HOSPITAL_COMMUNITY)
Admission: RE | Admit: 2012-12-03 | Discharge: 2012-12-03 | Disposition: A | Discharge status: Home / Self Care | Payer: Medicare Other | Source: Ambulatory Visit | Attending: Radiation Oncology | Admitting: Radiation Oncology

## 2012-12-03 ENCOUNTER — Ambulatory Visit (HOSPITAL_BASED_OUTPATIENT_CLINIC_OR_DEPARTMENT_OTHER): Payer: Medicare Other

## 2012-12-03 ENCOUNTER — Encounter: Payer: Self-pay | Admitting: Physician Assistant

## 2012-12-03 ENCOUNTER — Other Ambulatory Visit (HOSPITAL_BASED_OUTPATIENT_CLINIC_OR_DEPARTMENT_OTHER): Payer: Medicare Other | Admitting: Lab

## 2012-12-03 VITALS — BP 129/79 | HR 79 | Temp 98.0°F | Resp 20 | Ht 64.0 in | Wt 176.1 lb

## 2012-12-03 DIAGNOSIS — Z853 Personal history of malignant neoplasm of breast: Secondary | ICD-10-CM

## 2012-12-03 DIAGNOSIS — C773 Secondary and unspecified malignant neoplasm of axilla and upper limb lymph nodes: Secondary | ICD-10-CM

## 2012-12-03 DIAGNOSIS — Z170 Estrogen receptor positive status [ER+]: Secondary | ICD-10-CM

## 2012-12-03 DIAGNOSIS — C7951 Secondary malignant neoplasm of bone: Secondary | ICD-10-CM

## 2012-12-03 DIAGNOSIS — R079 Chest pain, unspecified: Secondary | ICD-10-CM

## 2012-12-03 DIAGNOSIS — M51379 Other intervertebral disc degeneration, lumbosacral region without mention of lumbar back pain or lower extremity pain: Secondary | ICD-10-CM | POA: Insufficient documentation

## 2012-12-03 DIAGNOSIS — C50912 Malignant neoplasm of unspecified site of left female breast: Secondary | ICD-10-CM

## 2012-12-03 DIAGNOSIS — M5137 Other intervertebral disc degeneration, lumbosacral region: Secondary | ICD-10-CM | POA: Insufficient documentation

## 2012-12-03 DIAGNOSIS — C50919 Malignant neoplasm of unspecified site of unspecified female breast: Secondary | ICD-10-CM

## 2012-12-03 DIAGNOSIS — M25551 Pain in right hip: Secondary | ICD-10-CM

## 2012-12-03 DIAGNOSIS — R109 Unspecified abdominal pain: Secondary | ICD-10-CM | POA: Insufficient documentation

## 2012-12-03 DIAGNOSIS — R0781 Pleurodynia: Secondary | ICD-10-CM

## 2012-12-03 DIAGNOSIS — R300 Dysuria: Secondary | ICD-10-CM

## 2012-12-03 DIAGNOSIS — Z5111 Encounter for antineoplastic chemotherapy: Secondary | ICD-10-CM

## 2012-12-03 LAB — URINALYSIS, MICROSCOPIC - CHCC
Protein: NEGATIVE mg/dL
Specific Gravity, Urine: 1.01 (ref 1.003–1.035)
Urobilinogen, UR: 0.2 mg/dL (ref 0.2–1)
pH: 6 (ref 4.6–8.0)

## 2012-12-03 LAB — COMPREHENSIVE METABOLIC PANEL (CC13)
AST: 21 U/L (ref 5–34)
Albumin: 3.8 g/dL (ref 3.5–5.0)
BUN: 14.2 mg/dL (ref 7.0–26.0)
Calcium: 9.4 mg/dL (ref 8.4–10.4)
Chloride: 106 mEq/L (ref 98–109)
Creatinine: 0.9 mg/dL (ref 0.6–1.1)
Glucose: 96 mg/dl (ref 70–140)

## 2012-12-03 LAB — CBC WITH DIFFERENTIAL/PLATELET
BASO%: 0.6 % (ref 0.0–2.0)
LYMPH%: 25.4 % (ref 14.0–49.7)
MCHC: 33.6 g/dL (ref 31.5–36.0)
MCV: 89.3 fL (ref 79.5–101.0)
MONO%: 9.4 % (ref 0.0–14.0)
NEUT%: 60.9 % (ref 38.4–76.8)
Platelets: 237 10*3/uL (ref 145–400)
RBC: 4.47 10*6/uL (ref 3.70–5.45)
nRBC: 0 % (ref 0–0)

## 2012-12-03 MED ORDER — ZOLEDRONIC ACID 4 MG/100ML IV SOLN
4.0000 mg | Freq: Once | INTRAVENOUS | Status: AC
  Administered 2012-12-03: 4 mg via INTRAVENOUS
  Filled 2012-12-03: qty 100

## 2012-12-03 MED ORDER — IOHEXOL 300 MG/ML  SOLN
100.0000 mL | Freq: Once | INTRAMUSCULAR | Status: AC | PRN
  Administered 2012-12-03: 100 mL via INTRAVENOUS

## 2012-12-03 MED ORDER — FULVESTRANT 250 MG/5ML IM SOLN
500.0000 mg | Freq: Once | INTRAMUSCULAR | Status: AC
  Administered 2012-12-03: 500 mg via INTRAMUSCULAR
  Filled 2012-12-03: qty 10

## 2012-12-03 NOTE — Telephone Encounter (Signed)
appts made.the patient requested not to print an avs. Stating they are on mychart...td

## 2012-12-03 NOTE — Patient Instructions (Signed)

## 2012-12-03 NOTE — Progress Notes (Addendum)
ID: Brooke [name]   DOB: 10/20/1950  MR#: [id number]  NWG#:[id number]  PCP: Lillia Mountain, MD GYN: SU:  OTHER MD: Lurline Hare, Ollen Gross  CHIEF COMPLAINT:  Metastatic Breast Cancer   HISTORY OF PRESENT ILLNESS: Brooke [name] had screening mammography in August 04, 2007 which showed no specific mammographic evidence of malignancy, but she reported a left breast lump.  Accordingly, she was brought back on August 11, 2007 for mammography and ultrasonography.  There was indeed an obscured mass in the upper left breast which by ultrasound appeared to be a simple cyst.    Screening mammogram on August 04, 2008 showed in addition to dense breasts, again a possible mass in the left breast.  She had diagnostic mammography August 06, 2008 and additional views in addition to very dense breasts did not show any persistent mass or distortion.  Furthermore, Dr. Manson Passey was not able to palpate any abnormality in the lateral portion of the left breast.  Ultrasound showed normal appearing fibroglandular tissue in the area in question.    More recently, about September, the patient felt again something like a lump in the left breast and she had some pain associated with this.  This was initially felt simply to be mastalgia and was treated as such, but as it persisted, on December 23rd, the patient had left diagnostic mammography and ultrasonography.  There was a focally dense area in the left breast with very minimal distortion corresponding to the area of palpable abnormality. The ultrasound showed an irregular ill-defined, hypoechoic area with distal shadowing, measuring approximately 1 cm corresponding to the patient's palpable abnormality.    With this information, the patient was brought back for ultrasound-guided core biopsy and this was performed December 28th.  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  There was no evidence of angiolymphatic  invasion in the material submitted which measured 1.5 cm maximally.  In addition to the mass in the breast, a lymph node in the left axilla was biopsied and was likewise positive.  The tumor was ER positive at 89%, PR positive at 34% with an elevated MIB-1 at 44% and Her-2 negative with a ratio of 1.13.    Her subsequent history is as detailed below.  INTERVAL HISTORY: Brooke [name] returns today with her husband [name] for followup of her metastatic breast cancer.  She has been receiving fulvestrant injections, the first given on 10/15, the second given on 10/29. She is due for the next injection today, and from this point forward will be receiving them on a monthly basis. She's also receiving zoledronic acid on a monthly basis, last given 11/05/2012 and due again today.  Brooke [name] is currently receiving radiation therapy under the care of Dr. Michell Heinrich, bilateral hips and right ribs.  She's been tolerating the radiation well, but unfortunately she has developed a new area of pain in the right side. This has been present now for approximately 1-1/2 weeks, and it is not improving. She describes it as a constant dull ache and feels like it is "swollen". It hurts to push on the area. She has difficulty sleeping on her right side. She denies any known injuries. She's had no skin changes in the area whatsoever. She has no actual back pain. At its worst the pain has been a 7 on a scale of 1-10. She was started on oxycodone last night by Dr. Michell Heinrich, and today she tells me the pain is better controlled, a 4 on a  scale of 1-10. It is just beneath the right rib cage it extends from the back to the right upper quadrant of the abdomen.     REVIEW OF SYSTEMS: Otherwise, Brooke [name] denies fevers or chills. She denies any abnormal bruising or abnormal bleeding. She continues to tolerate both fulvestrant and the zoledronic acid well. She does have hot flashes. She feels like these are fewer in number, but are "more intense" since starting  the fulvestrant. She denies any dental issues, recent procedures, extractions, or jaw pain. She has some occasional problems with nausea, but no emesis. I will mention that in the past codeine has caused nausea, and she has found that she can take the oxycodone as long as she takes it along with prochlorperazine. She's taking a stool softener daily for mild constipation. She's had no change in urinary habits and denies dysuria or hematuria. She's had no new cough, increased shortness of breath, chest pain, palpitations, or peripheral swelling. She also denies any abnormal headaches, dizziness, or change in vision.  A detailed review of systems is otherwise stable and noncontributory.    PAST MEDICAL HISTORY: Past Medical History  Diagnosis Date  . Hx Breast Cancer, IDC, Stahe III, receptor + 10/20/2010    left breast  . Breast cancer December 2010    invasive ductal and invasive lobular; bilateral mastectomy and radiation  . Mitral valve prolapse   . History of recurrent UTIs   . Radiation 07/26/09-09/08/09    left breast  1. Remote migraines, currently inactive.   2. History of prior diagnosis of fibromyalgia made at [facility] and currently not active (she was treated with trimethoprim and nortriptyline for one year for this).    3. History of recurrent UTIs.   4. History of benign left breast cyst aspirated in 2004 at [facility] under Dr. Christain Sacramento.   5. History of tonsillectomy and adenoidectomy.   6. History of right LASEK surgery. 7. History of bilateral reduction mammoplasties in 1994.   8. History of mitral valve prolapse diagnosed in 1984.  PAST SURGICAL HISTORY: Past Surgical History  Procedure Laterality Date  . Mastectomy Bilateral 2011    left breast cancer  . Lasik    . Reduction mammaplasty Bilateral 1994  . Tonsillectomy and adenoidectomy    . Aspiration of left breast Left 2004    [facility]/Dr.Teo    FAMILY HISTORY Family  History  Problem Relation Age of Onset  . Lung cancer Father 53    smoker for 25 years  . Heart disease Maternal Grandmother   . Heart disease Maternal Grandfather   . Diabetes type I Mother   . Diabetes type I Sister   . Diabetes type I Sister   . Heart disease Maternal Uncle   . Uterine cancer Paternal Grandmother 69  . Breast cancer Other 12    paternal great grandmother  [name] died at the age of 86 from lung cancer.  He had been a remote smoker and had been in [employer] with a question of possible asbestos exposure.  [name] died at the age of 28 in the setting of Alzheimer's, coronary artery disease, diabetes and hypertension.  The patient had two sisters.  One died from complications of diabetes.  The other one is alive with diabetes.    GYNECOLOGIC HISTORY: She is GX P1.  First pregnancy to term at age 64.  The patient's last period was in 2008.  She took hormone replacement  for about 18 months until mid-2008.  She had no complications from that treatment.    SOCIAL HISTORY: (Updated October 2014) She used to be a Associate Professor for [employer].  They used to live in the [place] area. 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  Her husband, [name], retired October 2012.  He was a Scientific laboratory technician for 28 years.  Their son [name], is currently with the [employer] at the [employer] in [place]. [name].  She attends [employer].   ADVANCED DIRECTIVES: in place  HEALTH MAINTENANCE: (Updated 11/19/2012) History  Substance Use Topics  . Smoking status: Former Smoker -- 1.00 packs/day for 11 years    Quit date: 01/23/1984  . Smokeless tobacco: Never Used  . Alcohol Use: Yes     Comment: wine, 3x/week     Colonoscopy: 2010  PAP: Jan 2014  Bone density: July 2014, osteopenia  Lipid panel: per Dr Boyce Medici  Allergies  Allergen Reactions  . Sulfa Antibiotics  Shortness Of Breath  . Codeine Nausea And Vomiting    Tolerates oxycodone if given with prochlorperazine for nausea    Current Outpatient Prescriptions  Medication Sig Dispense Refill  . Calcium Citrate-Vitamin D (CITRACAL/VITAMIN D PO) Take by mouth daily.      [name] Kitchen gabapentin (NEURONTIN) 300 MG capsule TAKE 1 CAPSULE (300 MG TOTAL) BY MOUTH AT BEDTIME.  90 capsule  1  . LORazepam (ATIVAN) 0.5 MG tablet TAKE 1 TABLET BY MOUTH 2 TIMES DAILY FOR ANXIETY  60 tablet  2  . oxyCODONE (OXY IR/ROXICODONE) 5 MG immediate release tablet Take 1 tablet (5 mg total) by mouth every 4 (four) hours as needed for severe pain.  30 tablet  0  . prochlorperazine (COMPAZINE) 10 MG tablet Take 1 tablet (10 mg total) by mouth every 6 (six) hours as needed for nausea or vomiting.  30 tablet  0  . valACYclovir (VALTREX) 500 MG tablet Take 500 mg by mouth as needed.      . venlafaxine XR (EFFEXOR-XR) 37.5 MG 24 hr capsule Take 1 capsule (37.5 mg total) by mouth 2 (two) times daily.  180 capsule  0   No current facility-administered medications for this visit.    OBJECTIVE: Middle-aged white woman who appears nervous but is in no acute distress Filed Vitals:   12/03/12 1409  BP: 129/79  Pulse: 79  Temp: 98 F (36.7 C)  Resp: 20     Body mass index is 30.21 kg/(m^2).    ECOG FS: 1 Filed Weights   12/03/12 1409  Weight: 176 lb 1.6 oz (79.878 kg)   Physical Exam: HEENT:  Sclerae anicteric.  Oropharynx clear. Good dentition. No ulcerations or candidiasis noted NODES:  No cervical or supraclavicular lymphadenopathy palpated.  BREAST EXAM: Deferred. Axillae are benign bilaterally with no palpable lymphadenopathy. LUNGS:  Clear to auscultation bilaterally.  No wheezes or rhonchi.  Good excursion bilaterally. HEART:  Regular rate and rhythm.  ABDOMEN:  Soft, nontender.  No hepatomegaly is noted. No obvious edema is noted in the right upper quadrant of the abdomen. Positive bowel sounds.  MSK:  No focal spinal  tenderness to vigorous palpation. Full range of motion in both the upper and lower extremities. There is some tenderness to palpation along the right anterior rib cage, and also some tenderness to palpation beneath the right rib cage, extending from the lower back towards the right upper quadrant of  the abdomen.  EXTREMITIES:  No peripheral edema.   SKIN:  No obvious rashes or skin change, and specifically no vesicles noted in the painful area of the right side to indicate zoster. NEURO:  Nonfocal. Well oriented.  Appropriate affect.   LAB RESULTS: Lab Results  Component Value Date   WBC 5.1 12/03/2012   NEUTROABS 3.1 12/03/2012   HGB 13.4 12/03/2012   HCT 39.9 12/03/2012   MCV 89.3 12/03/2012   PLT 237 12/03/2012      Chemistry      Component Value Date/Time   NA 140 11/19/2012 1256   NA 142 06/26/2011 0728   NA 143 12/12/2010 1325   K 4.3 11/19/2012 1256   K 4.6 06/26/2011 0728   K 4.7 12/12/2010 1325   CL 105 01/07/2012 1347   CL 102 06/26/2011 0728   CL 105 12/12/2010 1325   CO2 25 11/19/2012 1256   CO2 30 06/26/2011 0728   CO2 27 12/12/2010 1325   BUN 14.0 11/19/2012 1256   BUN 12 06/26/2011 0728   BUN 14 12/12/2010 1325   CREATININE 0.9 11/19/2012 1256   CREATININE 1.1 06/26/2011 0728   CREATININE 0.96 12/12/2010 1325      Component Value Date/Time   CALCIUM 9.8 11/19/2012 1256   CALCIUM 9.1 06/26/2011 0728   CALCIUM 10.1 12/12/2010 1325   ALKPHOS 76 11/19/2012 1256   ALKPHOS 54 12/12/2010 1325   AST 19 11/19/2012 1256   AST 19 12/12/2010 1325   ALT 16 11/19/2012 1256   ALT 19 12/12/2010 1325   BILITOT 0.50 11/19/2012 1256   BILITOT 0.3 12/12/2010 1325       Lab Results  Component Value Date   LABCA2 21 01/07/2012    STUDIES:  Nm Pet Image Restag (ps) Skull Base To Thigh  11/05/2012   CLINICAL DATA:  Restaging treatment strategy for breast cancer.  EXAM: NUCLEAR MEDICINE PET SKULL BASE TO THIGH  FASTING BLOOD GLUCOSE:  Value:  84 mg/dl  TECHNIQUE: 16.1 mCi W-96  FDG was injected intravenously. CT data was obtained and used for attenuation correction and anatomic localization only. (This was not acquired as a diagnostic CT examination.) Additional exam technical data entered on technologist worksheet.  COMPARISON:  02/01/2009  FINDINGS: NECK  No hypermetabolic lymph nodes in the neck.  CHEST  Previous bilateral mastectomy. Left axillary lymph node dissection has been performed. No hypermetabolic pulmonary nodule or mass identified. There is no adenopathy noted within the chest.  ABDOMEN/PELVIS  No abnormal hypermetabolic activity within the liver, pancreas, adrenal glands, or spleen. No hypermetabolic lymph nodes in the abdomen or pelvis.  SKELETON  Multi focal hypermetabolic bone metastases are identified. Right 7th rib metastasis is identified. This has an SUV max equal to 5.6, image 97. Hypermetabolic metastasis to the L1 vertebra is identified. This has an SUV max equal to 11.7. Hypermetabolic lytic lesion involving the anterior column of the right acetabulum is identified. There is an associated pathologic fracture through the anterior cortex. This measures approximately 3.2 cm and has an SUV max equal to 12.3. The location and destructive nature of this particular lesion may be of orthopedic significance.  IMPRESSION: 1. 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 Of this is new since the previous examination.  2. Pathologic fracture involving the anterior column of the right acetabulum may be a of orthopedic significance.  3.  No extra osseous metastatic disease identified   Electronically Signed   By: Veronda Prude.D.  On: 11/05/2012 11:49     ASSESSMENT: 61 y.o. [place] woman   (1)  status post Left breast and Left axillary lymph node biopsy Dec 2010 for a cT3 pN1 (stage IIIA) invasive ductal carcinoma, grade 2, estrogen and progesterone receptor positive, HER-2 negative, with an MIB-1 of 44%;   (2)  treated  neoadjuvantly with dose dense cyclophosphamide and doxorubicin x4 followed by weekly paclitaxel x7  (3) s/p bilateral mastectomies May 2011. She had a residual 1.5 cm tumor, and one of 8 sampled lymph nodes was positive [ypT1c ypN1]. Margins were negative.   (4)  She completed radiation in August of 2011   (5) on letrozole since August 2011  (6) PET scan 11/05/2012 obtained to evaluate right chest wall pain documents bony metastatic disease to the right seventh rib, L1, and the right acetabulum with an associated pathologic fracture through the anterior cortex of the acetabulum measuring 3.2 cm. There was no evidence of extra osseous metastatic disease.  (7)  as of 11/05/2012, receiving fulvestrant injections, 500 mg monthly, in addition to monthly infusions of zoledronic acid.   PLAN:  Dr. Darnelle Catalan also spoke extensively with Brooke [name] today, participating in her physical exam as well. He has also evaluated the painful area in the right side, and feels that this could likely be musculoskeletal in origin, perhaps a pulled muscle. It is reassuring that she just had a PET scan 3 weeks ago that showed nothing abnormal in this area other than the known bony lesions. Specifically, the PET scan showed no extra osseous metastatic disease. Brooke [name] is scheduled, however, for a CT later this afternoon for further evaluation.  In the meanwhile, she'll receive both zoledronic acid and fulvestrant today as scheduled, and we will continue with both agents on a monthly basis from this point forward. She'll continue with her radiation therapy, scheduled to be completed on November 26. Dr. Darnelle Catalan will see her the following week on December 3 for followup and discussion of her treatment plan. At this time, our plan is to continue this regimen for 3 months, then repeat a PET scan in mid January. This has also been scheduled as well.  Brooke [name] and Phil both voice understanding and agreement with the above plan. They will call with  any changes or problems.   Montell Leopard PA-C    12/03/2012   ADDENDUM: I met with Pat and examined her today. He weighs she points to 1 spot in her right posterior flank, indicate that this pain is not internal, but a chest wall pain. He does not seem to be consistent with right kidney stones, as there is no radiation and so far as we can tell no hematuria. It doesn't seem to be like gallbladder disease, which is more diffuse, more anterior, radiates differently, and she has no nausea or vomiting or fever. I palpated and percussed her back so I don't think this is going to be a radiculopathy.  I think what she is experiencing is a muscle cramp related possibly to the complex position she has to assume for prolonged period of x4 radiation.  I reassured her regarding this and pointed out that she had a PET scan only 3 weeks ago showing no abnormality in that area.. Still she will be having a CT scan later today to further assess the problem.  She is already scheduled to see me December 3, which is a couple days after she completes her radiation treatments. We will start working on long-term plans at that point.  I personally saw this patient and performed a substantive portion of this encounter with the listed APP documented above.   Lowella Dell, MD

## 2012-12-04 ENCOUNTER — Ambulatory Visit
Admission: RE | Admit: 2012-12-04 | Discharge: 2012-12-04 | Disposition: A | Discharge status: Home / Self Care | Payer: Medicare Other | Source: Ambulatory Visit | Attending: Radiation Oncology | Admitting: Radiation Oncology

## 2012-12-04 NOTE — Progress Notes (Signed)
Called patient to inform ct of abdomen negative for metastatic disease.Patient had already reviewed results on my chart.Question expansion on right 6 th rib.Dr.Wentworth did review images and this is same as on PET.could just be from radiation.

## 2012-12-05 ENCOUNTER — Ambulatory Visit
Admission: RE | Admit: 2012-12-05 | Discharge: 2012-12-05 | Disposition: A | Discharge status: Home / Self Care | Payer: Medicare Other | Source: Ambulatory Visit | Attending: Radiation Oncology | Admitting: Radiation Oncology

## 2012-12-05 ENCOUNTER — Encounter: Payer: Self-pay | Admitting: *Deleted

## 2012-12-05 ENCOUNTER — Encounter: Payer: Self-pay | Admitting: Radiation Oncology

## 2012-12-05 VITALS — BP 143/91 | HR 78 | Temp 98.0°F | Resp 20

## 2012-12-05 DIAGNOSIS — C50919 Malignant neoplasm of unspecified site of unspecified female breast: Secondary | ICD-10-CM

## 2012-12-05 MED ORDER — ONDANSETRON HCL 8 MG PO TABS: 8.0000 mg | ORAL_TABLET | Freq: Two times a day (BID) | ORAL | Status: DC

## 2012-12-05 NOTE — Progress Notes (Signed)
C/o soreness in hips

## 2012-12-05 NOTE — Progress Notes (Signed)
[facility] Clinical Social Work  Clinical Social Work was referred by Careers information officer for assessment of psychosocial needs.  Clinical Social Worker contacted patient at home to offer support and assess for needs.  Pt reports to be coping fairly well. She states they retired to [place] about five years ago and they have developed a local network of friends. She appears to have good support from her husband as well. CSW discussed resources and supports here at the Patient and [facility] and common emotions/coping techniques. CSW validated emotions and Pt agrees to contact CSW as needs arise. Pt appreciative of contact and CSW will follow as needed.     Clinical Social Work interventions: Supportive listening Resource education  Doreen Salvage, [place] Clinical Social Worker [facility] for Patient & Family Support [facility] Wednesday, Thursday and Friday Phone: [phone] Fax: [phone]

## 2012-12-05 NOTE — Progress Notes (Signed)
Weekly rad tx,'s rt hip, lt hip, rt hip, 7 completed, patient tearful  Since Wednesday too much going on  Stated patient, has been nauseated since, azofran and compazine not helping, "I don't think I can take much more ", crying, offered water, and tissues, not taking oxycodone, having regular bowel movements, only takes ativan mostly in am, when she takes the 2nd one it just doesn't make me feel good,"  Wants to see MD, pt c/o of having to wait 1 hour past her rad appt time, asked to see someone to fix thias, AAmy changed pt's schedule for her 6:59 PM

## 2012-12-06 NOTE — Progress Notes (Signed)
   Department of Radiation Oncology  Phone:  [phone] Fax:        [phone]  Weekly Treatment Note    Name: BRINDA FOCHT Date: 12/06/2012 MRN: [id number] DOB: [birth year]   Current dose: 17.5 Gy  Current fraction: 7   MEDICATIONS: Current Outpatient Prescriptions  Medication Sig Dispense Refill  . Calcium Citrate-Vitamin D (CITRACAL/VITAMIN D PO) Take by mouth daily.      [name] Kitchen gabapentin (NEURONTIN) 300 MG capsule TAKE 1 CAPSULE (300 MG TOTAL) BY MOUTH AT BEDTIME.  90 capsule  1  . LORazepam (ATIVAN) 0.5 MG tablet TAKE 1 TABLET BY MOUTH 2 TIMES DAILY FOR ANXIETY  60 tablet  2  . prochlorperazine (COMPAZINE) 10 MG tablet Take 1 tablet (10 mg total) by mouth every 6 (six) hours as needed for nausea or vomiting.  30 tablet  0  . valACYclovir (VALTREX) 500 MG tablet Take 500 mg by mouth as needed.      . venlafaxine XR (EFFEXOR-XR) 37.5 MG 24 hr capsule Take 1 capsule (37.5 mg total) by mouth 2 (two) times daily.  180 capsule  0  . ondansetron (ZOFRAN) 8 MG tablet Take 1 tablet (8 mg total) by mouth 2 (two) times daily.  30 tablet  0  . oxyCODONE (OXY IR/ROXICODONE) 5 MG immediate release tablet Take 1 tablet (5 mg total) by mouth every 4 (four) hours as needed for severe pain.  30 tablet  0   No current facility-administered medications for this encounter.     ALLERGIES: Sulfa antibiotics and Codeine   LABORATORY DATA:  Lab Results  Component Value Date   WBC 5.1 12/03/2012   HGB 13.4 12/03/2012   HCT 39.9 12/03/2012   MCV 89.3 12/03/2012   PLT 237 12/03/2012   Lab Results  Component Value Date   NA 138 12/03/2012   K 4.2 12/03/2012   CL 105 01/07/2012   CO2 23 12/03/2012   Lab Results  Component Value Date   ALT 20 12/03/2012   AST 21 12/03/2012   ALKPHOS 76 12/03/2012   BILITOT 0.30 12/03/2012     NARRATIVE: [name] was seen today for weekly treatment management. The chart was checked and the patient's films were reviewed. The patient was  seen today with couple of issues which she wanted to discuss. She has had a later time and she has worked with the therapists about maybe getting an earlier time. I'm told that this has been resolved. The patient also complains of some significant nausea over the last several days. She has been taking Compazine which has not been helping enough. She also does have Ativan but she only likes to take this once a day - she does not like taking it later in the day based on how it makes her feel.  PHYSICAL EXAMINATION: oral temperature is 98 F (36.7 C). Her blood pressure is 143/91 and her pulse is 78. Her respiration is 20.        ASSESSMENT: The patient is doing satisfactorily with treatment.  PLAN: We will continue with the patient's radiation treatment as planned. The patient was given a prescription for Zofran to take in addition to Compazine. I urged her to take this twice a day over the next couple of days and then moved to taking this on a when necessary basis.

## 2012-12-08 ENCOUNTER — Ambulatory Visit
Admission: RE | Admit: 2012-12-08 | Discharge: 2012-12-08 | Disposition: A | Discharge status: Home / Self Care | Payer: Medicare Other | Source: Ambulatory Visit | Attending: Radiation Oncology | Admitting: Radiation Oncology

## 2012-12-09 ENCOUNTER — Ambulatory Visit
Admission: RE | Admit: 2012-12-09 | Discharge: 2012-12-09 | Disposition: A | Discharge status: Home / Self Care | Payer: Medicare Other | Source: Ambulatory Visit | Attending: Radiation Oncology | Admitting: Radiation Oncology

## 2012-12-09 DIAGNOSIS — C50912 Malignant neoplasm of unspecified site of left female breast: Secondary | ICD-10-CM

## 2012-12-09 NOTE — Progress Notes (Signed)
Weekly Management Note Current Dose:  22.5 Gy  Projected Dose: 37.5 Gy   Narrative:  The patient presents for routine under treatment assessment.  CBCT/MVCT images/Port film x-rays were reviewed.  The chart was checked. Anusea controlled with compazine. Zofran gave her a headache. Appt with [facility] on Dec 3. Pain improved. SW referral made.   Physical Findings: Weight:  . Unchanged. Smiling, No pain.   Impression:  The patient is tolerating radiation.  Plan:  Continue treatment as planned. Continue RT encouraged her to f/u with SW.

## 2012-12-10 ENCOUNTER — Ambulatory Visit
Admission: RE | Admit: 2012-12-10 | Discharge: 2012-12-10 | Disposition: A | Discharge status: Home / Self Care | Payer: Medicare Other | Source: Ambulatory Visit | Attending: Radiation Oncology | Admitting: Radiation Oncology

## 2012-12-11 ENCOUNTER — Other Ambulatory Visit: Payer: Self-pay | Admitting: Radiation Oncology

## 2012-12-11 ENCOUNTER — Ambulatory Visit
Admission: RE | Admit: 2012-12-11 | Discharge: 2012-12-11 | Disposition: A | Discharge status: Home / Self Care | Payer: Medicare Other | Source: Ambulatory Visit | Attending: Radiation Oncology | Admitting: Radiation Oncology

## 2012-12-12 ENCOUNTER — Ambulatory Visit
Admission: RE | Admit: 2012-12-12 | Discharge: 2012-12-12 | Disposition: A | Discharge status: Home / Self Care | Payer: Medicare Other | Source: Ambulatory Visit | Attending: Radiation Oncology | Admitting: Radiation Oncology

## 2012-12-15 ENCOUNTER — Ambulatory Visit
Admission: RE | Admit: 2012-12-15 | Discharge: 2012-12-15 | Disposition: A | Discharge status: Home / Self Care | Payer: Medicare Other | Source: Ambulatory Visit | Attending: Radiation Oncology | Admitting: Radiation Oncology

## 2012-12-16 ENCOUNTER — Ambulatory Visit
Admission: RE | Admit: 2012-12-16 | Discharge: 2012-12-16 | Disposition: A | Discharge status: Home / Self Care | Payer: Medicare Other | Source: Ambulatory Visit | Attending: Radiation Oncology | Admitting: Radiation Oncology

## 2012-12-16 DIAGNOSIS — C50912 Malignant neoplasm of unspecified site of left female breast: Secondary | ICD-10-CM

## 2012-12-16 NOTE — Progress Notes (Signed)
Weekly Management Note Current Dose: 35  Gy  Projected Dose: 37.5 Gy   Narrative:  The patient presents for routine under treatment assessment.  CBCT/MVCT images/Port film x-rays were reviewed.  The chart was checked. Hip and rib pain better. Still with right upper quadrant pain. Feeling better. Appt at [facility] Jan 16th. Needs RT records.   Physical Findings: Pleasant. No distress.   Impression:  The patient is tolerating radiation.  Plan:  Continue treatment as planned. Follow up 1 month. Will forward records at her request to Scarlette Calico at [facility] fax # [phone]

## 2012-12-17 ENCOUNTER — Telehealth: Payer: Self-pay | Admitting: Radiation Oncology

## 2012-12-17 ENCOUNTER — Encounter: Payer: Self-pay | Admitting: Radiation Oncology

## 2012-12-17 ENCOUNTER — Ambulatory Visit
Admission: RE | Admit: 2012-12-17 | Discharge: 2012-12-17 | Disposition: A | Discharge status: Home / Self Care | Payer: Medicare Other | Source: Ambulatory Visit | Attending: Radiation Oncology | Admitting: Radiation Oncology

## 2012-12-17 NOTE — Telephone Encounter (Signed)
Per SW, FUP 11/13/12, PUT 12/16/12 to [facility], [phone].  Received confirmation.

## 2012-12-21 NOTE — Progress Notes (Signed)
  Radiation Oncology         [phone] ________________________________  Name: LEONNA SCHLEE MRN: [id number]  Date: 12/17/2012  DOB: March 13, 1950  End of Treatment Note  Diagnosis:   Metastatic breast cancer to bone     Indication for treatment:  Palliation       Radiation treatment dates:   11/27/2012-12/17/2012  Site/dose:   Right rib, left hip and right hip were all treated to 37.5 Gy in 15 fractions at 2.5 Gy per fraction  Beams/energy:   Ms. Broyles was treated concurrently to 3 areas. Her right rib was treated with 9 MeV electrons in an enface directions. Her bilateral hips were treated with 15 MV photons in an AP/PA technique.   Narrative: The patient tolerated radiation treatment relatively well.   She had some unexplained nausea which resolved as treatment progressed. Her pain was controlled at the end of treatment.   Plan: The patient has completed radiation treatment. The patient will return to radiation oncology clinic for routine followup in one month. I advised her to call or return sooner if she had any questions or concerns related to recovery or treatment.  ------------------------------------------------  Lurline Hare, MD

## 2012-12-22 ENCOUNTER — Telehealth: Payer: Self-pay | Admitting: Obstetrics & Gynecology

## 2012-12-22 NOTE — Telephone Encounter (Signed)
Patient is asking to talk to Dr. Rondel Baton nurse. ( no details given)

## 2012-12-22 NOTE — Telephone Encounter (Signed)
Pt is having pain with intercourse. Please call to schedule an appointment.

## 2012-12-22 NOTE — Telephone Encounter (Signed)
Spoke with pt who noticed pain with intercourse yesterday for the first time, but she has noticed some brownish-green discharge off and on for a few weeks. Pt has breast cancer diagnosis that has metastasized to the bone and is receiving treatment and wonders if that has "everything all messed up." Pt would like to see if she could get an appt with SM before 01-30-13 if possible.

## 2012-12-22 NOTE — Telephone Encounter (Signed)
Spoke with pt's husband, as pt is in the shower. He wants to make an appt for her. Offered OV anytime this week with DL or PG, but he knows pt would like to see SM. First available appt with SM is 12-30-12 at 3:45.

## 2012-12-23 ENCOUNTER — Ambulatory Visit (INDEPENDENT_AMBULATORY_CARE_PROVIDER_SITE_OTHER): Payer: Medicare Other | Admitting: Obstetrics & Gynecology

## 2012-12-23 ENCOUNTER — Encounter: Payer: Self-pay | Admitting: Obstetrics & Gynecology

## 2012-12-23 VITALS — BP 122/60 | HR 74 | Resp 16 | Wt 177.0 lb

## 2012-12-23 DIAGNOSIS — R87810 Cervical high risk human papillomavirus (HPV) DNA test positive: Secondary | ICD-10-CM

## 2012-12-23 DIAGNOSIS — IMO0002 Reserved for concepts with insufficient information to code with codable children: Secondary | ICD-10-CM

## 2012-12-23 DIAGNOSIS — N898 Other specified noninflammatory disorders of vagina: Secondary | ICD-10-CM

## 2012-12-23 LAB — POCT URINALYSIS DIPSTICK
Glucose, UA: NEGATIVE
Urobilinogen, UA: NEGATIVE
pH, UA: 6

## 2012-12-23 MED ORDER — TERCONAZOLE 0.4 % VA CREA: 1.0000 | TOPICAL_CREAM | Freq: Every day | VAGINAL | Status: DC

## 2012-12-23 NOTE — Telephone Encounter (Signed)
Patient notified. She will come for office visit today at 1:15.

## 2012-12-23 NOTE — Progress Notes (Signed)
Subjective:     Patient ID: Brooke [name], female   DOB: 01-03-1951, 62 y.o.   MRN: [id number]  HPI 62 yo MWF here G3P1 MWF here for discussion of several weeks of vaginal discharge.  Pt with new diagnosis of recurrent breast cancer that has metastasized to bones.  Has just finished radiation and is on new medication for treatment.  Doing well.  A little emotional today.  Discharge is described as greenish and sticky.  Sort of comes and goes.  No itching or odor.  Patient reports some recent pain with intercourse over the last week or two.  No urinary issues.  No bowel problems.  Of course, no change in sex partners.  Husband with pt today.  She/they also want to discuss her HPV hx on Pap smear.  H/o nl pap with +HR HPV.  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  Reviewed with them guidelines for management and treatment.  Pt wants hysterectomy.  I do not recommend this as we have not even performed local therapy.  Pt admits some anxiety about this.  They both are comfortable with my answers about treatment recommendations.  D/W pt I think we should repeat Pap smears sooner, if next one remains abnormal, due to current breast cancer issues.  She says that would make her much more comfortable.  AEX scheduled with me 02/26/13.  Will repeat Pap and HR HPV then and proceed with colpo.  If any CIN present, she will be desirous of LEEP.  Pt and spouse very appreciative of time today.  Review of Systems  All other systems reviewed and are negative.       Objective:   Physical Exam  Constitutional: She is oriented to person, place, and time. She appears well-developed and well-nourished.  HENT:  Head: Normocephalic and atraumatic.  Abdominal: Soft. Bowel sounds are normal. She exhibits no distension. There is no tenderness.  Genitourinary: There is no rash, tenderness or lesion on the right labia. There is no rash, tenderness or lesion on the left labia. Uterus is not deviated, not enlarged, not fixed and not tender.  Cervix exhibits discharge. Cervix exhibits no motion tenderness and no friability. No signs of injury around the vagina. Vaginal discharge (greensih and sticky) found.  Musculoskeletal: Normal range of motion.  Lymphadenopathy:       Right: No inguinal adenopathy present.       Left: No inguinal adenopathy present.  Neurological: She is alert and oriented to person, place, and time.  Skin: Skin is warm and dry.  Psychiatric: She has a normal mood and affect.   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  Pt doesn't have yeast and I think this is medication related but I would like to see if the cream actually improved the discharge.  If so, Replens may work for control of this symptom.  Voices understanding of use.  Lengthy discussion of Pap and HR HPV.  Will repeat Pap and HR HPV 02/26/13 and proceed with additional management pending results.  ~25 minutes spent with patient >50% of time was in face to face discussion of above.

## 2012-12-23 NOTE — Telephone Encounter (Signed)
Dr. [name], patient calling into office to obtain an earlier appointment with you at her request. States she is having painful intercourse and vaginal discharge with some abdominal pain that is intermittent, denies fevers. Dr. [name] what do you advise for patient for appointment?

## 2012-12-24 ENCOUNTER — Ambulatory Visit (HOSPITAL_BASED_OUTPATIENT_CLINIC_OR_DEPARTMENT_OTHER): Payer: Medicare Other | Admitting: Oncology

## 2012-12-24 ENCOUNTER — Other Ambulatory Visit (HOSPITAL_BASED_OUTPATIENT_CLINIC_OR_DEPARTMENT_OTHER): Payer: Medicare Other | Admitting: Lab

## 2012-12-24 VITALS — BP 130/76 | HR 91 | Temp 98.6°F | Resp 18 | Ht 64.0 in | Wt 177.7 lb

## 2012-12-24 DIAGNOSIS — C50919 Malignant neoplasm of unspecified site of unspecified female breast: Secondary | ICD-10-CM

## 2012-12-24 DIAGNOSIS — C773 Secondary and unspecified malignant neoplasm of axilla and upper limb lymph nodes: Secondary | ICD-10-CM

## 2012-12-24 DIAGNOSIS — C7951 Secondary malignant neoplasm of bone: Secondary | ICD-10-CM

## 2012-12-24 DIAGNOSIS — Z853 Personal history of malignant neoplasm of breast: Secondary | ICD-10-CM

## 2012-12-24 DIAGNOSIS — Z170 Estrogen receptor positive status [ER+]: Secondary | ICD-10-CM

## 2012-12-24 DIAGNOSIS — C50512 Malignant neoplasm of lower-outer quadrant of left female breast: Secondary | ICD-10-CM | POA: Insufficient documentation

## 2012-12-24 DIAGNOSIS — G893 Neoplasm related pain (acute) (chronic): Secondary | ICD-10-CM

## 2012-12-24 LAB — COMPREHENSIVE METABOLIC PANEL (CC13)
ALT: 25 U/L (ref 0–55)
AST: 25 U/L (ref 5–34)
Alkaline Phosphatase: 67 U/L (ref 40–150)
Anion Gap: 9 mEq/L (ref 3–11)
CO2: 26 mEq/L (ref 22–29)
Creatinine: 0.9 mg/dL (ref 0.6–1.1)
Glucose: 89 mg/dl (ref 70–140)
Sodium: 140 mEq/L (ref 136–145)
Total Bilirubin: 0.32 mg/dL (ref 0.20–1.20)
Total Protein: 6.8 g/dL (ref 6.4–8.3)

## 2012-12-24 LAB — CBC WITH DIFFERENTIAL/PLATELET
BASO%: 0.6 % (ref 0.0–2.0)
EOS%: 5.1 % (ref 0.0–7.0)
HCT: 38.8 % (ref 34.8–46.6)
LYMPH%: 13.3 % — ABNORMAL LOW (ref 14.0–49.7)
MCH: 29.7 pg (ref 25.1–34.0)
MCHC: 32.8 g/dL (ref 31.5–36.0)
MONO#: 0.6 10*3/uL (ref 0.1–0.9)
NEUT#: 3.3 10*3/uL (ref 1.5–6.5)
NEUT%: 68.2 % (ref 38.4–76.8)
Platelets: 182 10*3/uL (ref 145–400)
RBC: 4.29 10*6/uL (ref 3.70–5.45)
RDW: 12.2 % (ref 11.2–14.5)
WBC: 4.8 10*3/uL (ref 3.9–10.3)

## 2012-12-24 MED ORDER — NAPROXEN 500 MG PO TABS: 500.0000 mg | ORAL_TABLET | Freq: Three times a day (TID) | ORAL | Status: DC | PRN

## 2012-12-24 MED ORDER — ALPRAZOLAM ER 1 MG PO TB24: 1.0000 mg | ORAL_TABLET | Freq: Every day | ORAL | Status: DC

## 2012-12-24 NOTE — Progress Notes (Signed)
ADDENDUM: I prescribed Xanax aches are 4 patch to take every morning. She may also use lorazepam if she wishes during the day, although she tells me she takes the drug more than once it does not agree with her.

## 2012-12-24 NOTE — Patient Instructions (Signed)
Please call and give me a report on symptoms after the treatment is completed.

## 2012-12-24 NOTE — Progress Notes (Signed)
ID: Brooke [name]   DOB: 10-28-1950  MR#: [id number]  FAO#:[id number]  PCP: Lillia Mountain, MD GYN: SU:  OTHER MD: Lurline Hare, Ollen Gross  CHIEF COMPLAINT:  "My pain is better"   HISTORY OF PRESENT ILLNESS: Brooke [name] had screening mammography in August 04, 2007 which showed no specific mammographic evidence of malignancy, but she reported a left breast lump.  Accordingly, she was brought back on August 11, 2007 for mammography and ultrasonography.  There was indeed an obscured mass in the upper left breast which by ultrasound appeared to be a simple cyst.    Screening mammogram on August 04, 2008 showed in addition to dense breasts, again a possible mass in the left breast.  She had diagnostic mammography August 06, 2008 and additional views in addition to very dense breasts did not show any persistent mass or distortion.  Furthermore, Dr. Manson Passey was not able to palpate any abnormality in the lateral portion of the left breast.  Ultrasound showed normal appearing fibroglandular tissue in the area in question.    More recently, about September, the patient felt again something like a lump in the left breast and she had some pain associated with this.  This was initially felt simply to be mastalgia and was treated as such, but as it persisted, on December 23rd, the patient had left diagnostic mammography and ultrasonography.  There was a focally dense area in the left breast with very minimal distortion corresponding to the area of palpable abnormality. The ultrasound showed an irregular ill-defined, hypoechoic area with distal shadowing, measuring approximately 1 cm corresponding to the patient's palpable abnormality.    With this information, the patient was brought back for ultrasound-guided core biopsy and this was performed December 28th.  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  There was no evidence of angiolymphatic invasion in  the material submitted which measured 1.5 cm maximally.  In addition to the mass in the breast, a lymph node in the left axilla was biopsied and was likewise positive.  The tumor was ER positive at 89%, PR positive at 34% with an elevated MIB-1 at 44% and Her-2 negative with a ratio of 1.13.    Her subsequent history is as detailed below.  INTERVAL HISTORY: [name] returns today with her husband [name] for followup of her metastatic breast cancer. Since her last visit here she completed her radiation treatments. The pain in her right hip and right rib cage area is now much better ("1 or 2/10".). She is tolerating the monthly zolendronate with no side effects that she is aware of. She is also doing well with the fulvestrant, although the injections due heart for 2 or 3 days, she says, after a dose.   REVIEW OF SYSTEMS: She denies unusual headaches, visual changes, nausea, vomiting, stiff neck, dizziness, or gait imbalance. There has been no cough, phlegm production, or pleurisy, no chest pain or pressure, and no change in bowel or bladder habits. She gets mild constipation from the oxycodone, but is on stool softeners to take care of that. Has been no fever, rash, bleeding, unexplained fatigue or unexplained weight loss. Aside from the symptoms mentioned above, detailed review of systems was otherwise entirely negative. She is just starting back on a walking program  PAST MEDICAL HISTORY: Past Medical History  Diagnosis Date  . Hx Breast Cancer, IDC, Stahe III, receptor + 10/20/2010    left breast  . Breast cancer December 2010  invasive ductal and invasive lobular; bilateral mastectomy and radiation  . Mitral valve prolapse   . History of recurrent UTIs   . Radiation 07/26/09-09/08/09    left breast  . Fracture, pelvis closed 11/2012    both hipsand number 7 rib, 15 rounds radiation  1. Remote migraines, currently inactive.   2. History of prior diagnosis of fibromyalgia made at [facility] and currently not active (she was treated with trimethoprim and nortriptyline for one year for this).    3. History of recurrent UTIs.   4. History of benign left breast cyst aspirated in 2004 at [facility] under Dr. Christain Sacramento.   5. History of tonsillectomy and adenoidectomy.   6. History of right LASEK surgery. 7. History of bilateral reduction mammoplasties in 1994.   8. History of mitral valve prolapse diagnosed in 1984.  PAST SURGICAL HISTORY: Past Surgical History  Procedure Laterality Date  . Mastectomy Bilateral 2011    left breast cancer  . Lasik    . Reduction mammaplasty Bilateral 1994  . Tonsillectomy and adenoidectomy    . Aspiration of left breast Left 2004    [facility]/Dr.Teo    FAMILY HISTORY Family History  Problem Relation Age of Onset  . Lung cancer Father 40    smoker for 25 years  . Heart disease Maternal Grandmother   . Heart disease Maternal Grandfather   . Diabetes type I Mother   . Diabetes type I Sister   . Diabetes type I Sister   . Heart disease Maternal Uncle   . Uterine cancer Paternal Grandmother 31  . Breast cancer Other 36    paternal great grandmother  [name] died at the age of 87 from lung cancer.  He had been a remote smoker and had been in [employer] with a question of possible asbestos exposure.  [name] died at the age of 81 in the setting of Alzheimer's, coronary artery disease, diabetes and hypertension.  The patient had two sisters.  One died from complications of diabetes.  The other one is alive with diabetes.    GYNECOLOGIC HISTORY: She is GX P1.  First pregnancy to term at age 82.  The patient's last period was in 2008.  She took hormone replacement for about 18 months until mid-2008.  She had no complications from that treatment.    SOCIAL HISTORY: (Updated October 2014) She used to be a Associate Professor for [employer].  They used to live in the [place] area. 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  Her husband, [name], retired October 2012.  He was a Scientific laboratory technician for 28 years.  Their son [name], is currently with the [employer] at the [employer] in [place]. [name].  She attends [employer].   ADVANCED DIRECTIVES: in place  HEALTH MAINTENANCE: (Updated 11/19/2012) History  Substance Use Topics  . Smoking status: Former Smoker -- 1.00 packs/day for 11 years    Quit date: 01/23/1984  . Smokeless tobacco: Never Used  . Alcohol Use: Yes     Comment: wine, 3x/week     Colonoscopy: 2010  PAP: Jan 2014  Bone density: July 2014, osteopenia  Lipid panel: per Dr Boyce Medici  Allergies  Allergen Reactions  . Sulfa Antibiotics Shortness Of Breath  . Codeine Nausea And Vomiting    Tolerates oxycodone if given with prochlorperazine for nausea  Current Outpatient Prescriptions  Medication Sig Dispense Refill  . Calcium Citrate-Vitamin D (CITRACAL/VITAMIN D PO) Take by mouth daily.      Tery Sanfilippo Calcium (STOOL SOFTENER PO) Take by mouth as needed.      . fulvestrant (FASLODEX) 250 MG/5ML injection Inject 250 mg into the muscle every 30 (thirty) days. One injection each buttock over 1-2 minutes. Warm prior to use.      . gabapentin (NEURONTIN) 300 MG capsule TAKE 1 CAPSULE (300 MG TOTAL) BY MOUTH AT BEDTIME.  90 capsule  1  . LORazepam (ATIVAN) 0.5 MG tablet TAKE 1 TABLET BY MOUTH 2 TIMES DAILY FOR ANXIETY  60 tablet  2  . oxyCODONE (OXY IR/ROXICODONE) 5 MG immediate release tablet Take 1 tablet (5 mg total) by mouth every 4 (four) hours as needed for severe pain.  30 tablet  0  . terconazole (TERAZOL 7) 0.4 % vaginal cream Place 1 applicator vaginally at bedtime.  45 g  0  . valACYclovir (VALTREX) 500 MG tablet Take 500 mg by mouth as needed.      . venlafaxine XR (EFFEXOR-XR) 37.5 MG 24 hr capsule Take 1 capsule (37.5 mg total) by mouth 2 (two) times daily.  180  capsule  0  . vitamin E 400 UNIT capsule Take 400 Units by mouth daily.      . Zoledronic Acid (ZOMETA IV) Inject into the vein every 30 (thirty) days.       No current facility-administered medications for this visit.    OBJECTIVE: Middle-aged white woman who appears stated age   45 Vitals:   12/24/12 1411  BP: 130/76  Pulse: 91  Temp: 98.6 F (37 C)  Resp: 18     Body mass index is 30.49 kg/(m^2).    ECOG FS: 1 Filed Weights   12/24/12 1411  Weight: 177 lb 11.2 oz (80.604 kg)   Sclerae unicteric, pupils equal and reactive Oropharynx clear and moist-- no thrush No cervical or supraclavicular adenopathy Lungs no rales or rhonchi Heart regular rate and rhythm Abd soft, nontender, positive bowel sounds, no masses palpated MSK no focal spinal tenderness, including palpation and percussion of the lower lumbar spine; no upper extremity lymphedema; no rib cage tenderness to AP compression Neuro: nonfocal, well oriented, positive affect Breasts: Status post bilateral mastectomies. There is no evidence of local recurrence. The left axilla is benign   LAB RESULTS: Lab Results  Component Value Date   WBC 4.8 12/24/2012   NEUTROABS 3.3 12/24/2012   HGB 12.7 12/24/2012   HCT 38.8 12/24/2012   MCV 90.6 12/24/2012   PLT 182 12/24/2012      Chemistry      Component Value Date/Time   NA 138 12/03/2012 1346   NA 142 06/26/2011 0728   NA 143 12/12/2010 1325   K 4.2 12/03/2012 1346   K 4.6 06/26/2011 0728   K 4.7 12/12/2010 1325   CL 105 01/07/2012 1347   CL 102 06/26/2011 0728   CL 105 12/12/2010 1325   CO2 23 12/03/2012 1346   CO2 30 06/26/2011 0728   CO2 27 12/12/2010 1325   BUN 14.2 12/03/2012 1346   BUN 12 06/26/2011 0728   BUN 14 12/12/2010 1325   CREATININE 0.9 12/03/2012 1346   CREATININE 1.1 06/26/2011 0728   CREATININE 0.96 12/12/2010 1325      Component Value Date/Time   CALCIUM 9.4 12/03/2012 1346   CALCIUM 9.1 06/26/2011 0728   CALCIUM 10.1 12/12/2010 1325   ALKPHOS 76  12/03/2012 1346   ALKPHOS 54 12/12/2010 1325   AST 21 12/03/2012 1346   AST 19 12/12/2010 1325   ALT 20 12/03/2012 1346   ALT 19 12/12/2010 1325   BILITOT 0.30 12/03/2012 1346   BILITOT 0.3 12/12/2010 1325       Lab Results  Component Value Date   LABCA2 21 01/07/2012    STUDIES:  Nm Pet Image Restag (ps) Skull Base To Thigh  11/05/2012   CLINICAL DATA:  Restaging treatment strategy for breast cancer.  EXAM: NUCLEAR MEDICINE PET SKULL BASE TO THIGH  FASTING BLOOD GLUCOSE:  Value:  84 mg/dl  TECHNIQUE: 16.1 mCi W-96 FDG was injected intravenously. CT data was obtained and used for attenuation correction and anatomic localization only. (This was not acquired as a diagnostic CT examination.) Additional exam technical data entered on technologist worksheet.  COMPARISON:  02/01/2009  FINDINGS: NECK  No hypermetabolic lymph nodes in the neck.  CHEST  Previous bilateral mastectomy. Left axillary lymph node dissection has been performed. No hypermetabolic pulmonary nodule or mass identified. There is no adenopathy noted within the chest.  ABDOMEN/PELVIS  No abnormal hypermetabolic activity within the liver, pancreas, adrenal glands, or spleen. No hypermetabolic lymph nodes in the abdomen or pelvis.  SKELETON  Multi focal hypermetabolic bone metastases are identified. Right 7th rib metastasis is identified. This has an SUV max equal to 5.6, image 97. Hypermetabolic metastasis to the L1 vertebra is identified. This has an SUV max equal to 11.7. Hypermetabolic lytic lesion involving the anterior column of the right acetabulum is identified. There is an associated pathologic fracture through the anterior cortex. This measures approximately 3.2 cm and has an SUV max equal to 12.3. The location and destructive nature of this particular lesion may be of orthopedic significance.  IMPRESSION: 1. 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 Of this is new since the previous  examination.  2. Pathologic fracture involving the anterior column of the right acetabulum may be a of orthopedic significance.  3.  No extra osseous metastatic disease identified   Electronically Signed   By: Signa Kell M.D.   On: 11/05/2012 11:49   Ct Abdomen W Contrast  12/03/2012   CLINICAL DATA:  Progressive right flank pain for 2 weeks. History of breast cancer with bone metastasis. Currently undergoing radiation therapy.  EXAM: CT ABDOMEN WITH CONTRAST  TECHNIQUE: Multidetector CT imaging of the abdomen was performed using the standard protocol following bolus administration of intravenous contrast.  CONTRAST:  OMNIPAQUE IOHEXOL 300 MG/ML  SOLN  COMPARISON:  11/05/2012 PET.  MRI abdomen 06/03/2009.  FINDINGS: Lower Chest: Clear lung bases. Normal heart size without pericardial or pleural effusion. Left-sided mastectomy.  Abdomen: Tiny cyst in the inferior right lobe of the liver at 2 mm on image 26/series 2. Subcapsular splenic cystic lesion was present on the MRI of 2011, consistent with a benign entity. 6 mm.  Normal stomach. Normal pancreas, gallbladder, adrenal glands, kidneys. Common duct minimally prominent for age, at 7 mm on coronal image 61. This is unchanged back to 06/03/2009 MRI and therefore likely within normal variation. No cause identified. No retroperitoneal or retrocrural adenopathy. Normal abdominal large and small bowel loops, without ascites. No evidence of omental or peritoneal disease.  Subcutaneous gas within the right gluteal region on image 56 is likely iatrogenic. More subtle findings identified on the left.  Bones/Musculoskeletal: Incompletely imaged right lateral 6th rib sclerosis and expansion on image 1. This is increased since the  prior PET. Advanced degenerative disc disease at L2-3 and less so L3-4. S-shaped lumbar spine curvature.  IMPRESSION: 1. Increased expansion of the lateral right 6th rib, likely due to osseous metastasis. 2. No other explanation for  right-sided pain. 3. No evidence of extraosseous metastasis within the abdomen. 4. Pelvis not imaged.   Electronically Signed   By: Jeronimo Greaves M.D.   On: 12/03/2012 17:09     ASSESSMENT: 62 y.o. [place] woman   (1)  status post Left breast and Left axillary lymph node biopsy Dec 2010 for a cT3 pN1 (stage IIIA) invasive ductal carcinoma, grade 2, estrogen and progesterone receptor positive, HER-2 negative, with an MIB-1 of 44%;   (2)  treated neoadjuvantly with dose dense cyclophosphamide and doxorubicin x4 followed by weekly paclitaxel x7  (3) s/p bilateral mastectomies May 2011. She had a residual 1.5 cm tumor, and one of 8 sampled lymph nodes was positive [ypT1c ypN1]. Margins were negative.   (4)  She completed radiation in August of 2011   (5) on letrozole between August 2011 and October 2014  (6) PET scan 11/05/2012 obtained to evaluate right chest wall pain documents bony metastatic disease to the right seventh rib, L1, and the right acetabulum with an associated pathologic fracture through the anterior cortex of the acetabulum measuring 3.2 cm. There was no evidence of extra osseous metastatic disease.  (7)  as of 11/05/2012, receiving fulvestrant injections, 500 mg monthly, in addition to monthly infusions of zoledronic acid.  (8) Right rib, left hip and right hip were all treated to 37.5 Gy in 15 fractions at 2.5 Gy per fraction completed 12/17/2012    PLAN:  [name] is looking and feeling much better. She is using oxycodone occasionally "to take a little vacation from anxiety", and while this is pretty harmless, I strongly urged her to save the narcotics and not use them for any purpose other than severe acute pain which is not controlled in any other way. We reviewed the "pain latter", and she will try Tylenol first, then naproxen 500 mg 3 times a day as needed, and then if that doesn't work we can try gabapentin or tramadol. She is bound to have chronic pain and I would prefer  to say the narcotics for acute pain.  She is very concerned about Phil. He is a bit despondent and depressed. We did discuss this at length today and I think he is having some anticipatory grief, as well as of course anxiety related to Pat's diagnosis and prognosis. She wondered if I could prescribe "something" for him "and I suggested he discuss this with his primary care physician, Dr. Valentina Lucks, who may want to try a mild antidepressant for fell over the next 6 months. By then I do believe both of them will have become more accustomed to the "new normal", and that additional help may not be further needed.  The plan is to continue the fulvestrant and zolendronate every 4 weeks until her is evidence of disease progression. She is already scheduled for a repeat PET scan in January and a visit with me the next day. She also is scheduled for a second opinion at [facility] later in January.  Has a good understanding of her overall plan is she is in agreement with that. She knows the goal of treatment is control of the tumor. She will call for any problems that may develop before her next visit here.  Lowella Dell, MD 12/24/2012

## 2012-12-26 NOTE — Addendum Note (Signed)
Addended by: Billey Co on: 12/26/2012 12:09 PM   Modules accepted: Orders, Medications

## 2012-12-29 ENCOUNTER — Telehealth: Payer: Self-pay | Admitting: Obstetrics & Gynecology

## 2012-12-29 ENCOUNTER — Other Ambulatory Visit: Payer: Self-pay | Admitting: *Deleted

## 2012-12-29 MED ORDER — ALPRAZOLAM ER 1 MG PO TB24: 1.0000 mg | ORAL_TABLET | Freq: Every day | ORAL | Status: DC

## 2012-12-29 NOTE — Telephone Encounter (Signed)
Faxed prescription to [facility] at [phone].

## 2012-12-29 NOTE — Telephone Encounter (Signed)
Spoke with pt's husband, as pt is in the shower. He reports pt is still having the same symptoms as she had at last visit, dyspareunia and greenish discharge. Terazol has not helped. They are wondering what the next step would be?

## 2012-12-29 NOTE — Telephone Encounter (Signed)
Patient was told to call with info after taking medication.

## 2012-12-29 NOTE — Telephone Encounter (Signed)
Spoke to pt to relay SM message. Pt will wait to hear what oncologist says. Pt appreciative.

## 2012-12-29 NOTE — Telephone Encounter (Signed)
Remind her that the Terazol was just a trial.  I didn't think she had yeast.  This is most likely a side effect of new breast cancer medication.  I will need to talk to her oncologist.  I will hopefully be able to do that this afternoon.  Let them know please.

## 2012-12-30 ENCOUNTER — Ambulatory Visit: Payer: Medicare Other | Admitting: Obstetrics & Gynecology

## 2012-12-30 ENCOUNTER — Other Ambulatory Visit: Payer: Self-pay | Admitting: Physician Assistant

## 2012-12-30 ENCOUNTER — Other Ambulatory Visit: Payer: Self-pay | Admitting: *Deleted

## 2012-12-30 DIAGNOSIS — C50919 Malignant neoplasm of unspecified site of unspecified female breast: Secondary | ICD-10-CM

## 2012-12-30 MED ORDER — ALPRAZOLAM ER 1 MG PO TB24: ORAL_TABLET | ORAL | Status: DC

## 2012-12-30 NOTE — Telephone Encounter (Signed)
Spoke with patient. She is wondering if Dr. [name] was able to reach Dr. Darnelle Catalan. I advised I could not see a new message but that I would send a message to Dr. [name] go confirm. She states she is still having vaginal discharge and feels it is irritating skin and wants to know what to do next regarding discharge.

## 2012-12-31 ENCOUNTER — Ambulatory Visit: Payer: Medicare Other

## 2012-12-31 ENCOUNTER — Other Ambulatory Visit: Payer: Medicare Other | Admitting: Lab

## 2012-12-31 ENCOUNTER — Other Ambulatory Visit (HOSPITAL_BASED_OUTPATIENT_CLINIC_OR_DEPARTMENT_OTHER): Payer: Medicare Other

## 2012-12-31 ENCOUNTER — Ambulatory Visit (HOSPITAL_BASED_OUTPATIENT_CLINIC_OR_DEPARTMENT_OTHER): Payer: Medicare Other

## 2012-12-31 VITALS — BP 123/61 | HR 94 | Temp 97.9°F | Resp 20

## 2012-12-31 DIAGNOSIS — C50919 Malignant neoplasm of unspecified site of unspecified female breast: Secondary | ICD-10-CM

## 2012-12-31 DIAGNOSIS — C7951 Secondary malignant neoplasm of bone: Secondary | ICD-10-CM

## 2012-12-31 DIAGNOSIS — Z5111 Encounter for antineoplastic chemotherapy: Secondary | ICD-10-CM

## 2012-12-31 DIAGNOSIS — Z853 Personal history of malignant neoplasm of breast: Secondary | ICD-10-CM

## 2012-12-31 LAB — CBC WITH DIFFERENTIAL/PLATELET
BASO%: 0.6 % (ref 0.0–2.0)
Basophils Absolute: 0 10*3/uL (ref 0.0–0.1)
EOS%: 7.2 % — ABNORMAL HIGH (ref 0.0–7.0)
HCT: 38.8 % (ref 34.8–46.6)
HGB: 12.9 g/dL (ref 11.6–15.9)
LYMPH%: 19.2 % (ref 14.0–49.7)
MCH: 29.7 pg (ref 25.1–34.0)
MCHC: 33.2 g/dL (ref 31.5–36.0)
MCV: 89.4 fL (ref 79.5–101.0)
MONO%: 10.7 % (ref 0.0–14.0)
NEUT%: 62.3 % (ref 38.4–76.8)
Platelets: 161 10*3/uL (ref 145–400)
RBC: 4.34 10*6/uL (ref 3.70–5.45)
lymph#: 0.9 10*3/uL (ref 0.9–3.3)

## 2012-12-31 MED ORDER — FULVESTRANT 250 MG/5ML IM SOLN
500.0000 mg | Freq: Once | INTRAMUSCULAR | Status: AC
  Administered 2012-12-31: 500 mg via INTRAMUSCULAR
  Filled 2012-12-31: qty 10

## 2012-12-31 MED ORDER — ZOLEDRONIC ACID 4 MG/100ML IV SOLN
4.0000 mg | Freq: Once | INTRAVENOUS | Status: AC
  Administered 2012-12-31: 4 mg via INTRAVENOUS
  Filled 2012-12-31: qty 100

## 2012-12-31 MED ORDER — SODIUM CHLORIDE 0.9 % IV SOLN
Freq: Once | INTRAVENOUS | Status: AC
  Administered 2012-12-31: 14:00:00 via INTRAVENOUS

## 2012-12-31 NOTE — Telephone Encounter (Signed)
Pleas let her know i have.  This can be a side effect of her medication   i want her to try replens vaginal moisturizer--3times weekly.  It is OTC.  Ok to use more often as non hormonal.  Report back after a couple of weeks.

## 2012-12-31 NOTE — Telephone Encounter (Signed)
Spoke with patient. Message from Dr. [name] given. Patient will try Replens and call back if having ongoing symptoms.

## 2012-12-31 NOTE — Patient Instructions (Signed)

## 2013-01-02 ENCOUNTER — Other Ambulatory Visit: Payer: Medicare Other | Admitting: Lab

## 2013-01-06 ENCOUNTER — Other Ambulatory Visit: Payer: Self-pay | Admitting: Oncology

## 2013-01-20 ENCOUNTER — Telehealth: Payer: Self-pay | Admitting: Obstetrics & Gynecology

## 2013-01-20 NOTE — Telephone Encounter (Signed)
Patient returned call and said to cancel. States she is seeing pcp for issue.  Routing to provider for final review. Patient agreeable to disposition. Will close encounter

## 2013-01-20 NOTE — Telephone Encounter (Signed)
Patient thinks she may have a uti. Open to see anyone today. Nothing i can put her in

## 2013-01-27 ENCOUNTER — Other Ambulatory Visit: Payer: Medicare Other | Admitting: Lab

## 2013-01-27 ENCOUNTER — Ambulatory Visit: Payer: Medicare Other | Admitting: Physician Assistant

## 2013-01-28 ENCOUNTER — Ambulatory Visit: Payer: Medicare Other

## 2013-01-28 ENCOUNTER — Other Ambulatory Visit (HOSPITAL_BASED_OUTPATIENT_CLINIC_OR_DEPARTMENT_OTHER): Payer: Medicare Other

## 2013-01-28 ENCOUNTER — Ambulatory Visit (HOSPITAL_BASED_OUTPATIENT_CLINIC_OR_DEPARTMENT_OTHER): Payer: Medicare Other

## 2013-01-28 ENCOUNTER — Other Ambulatory Visit: Payer: Medicare Other | Admitting: Lab

## 2013-01-28 VITALS — BP 120/74 | HR 87 | Temp 98.0°F | Resp 18

## 2013-01-28 DIAGNOSIS — Z853 Personal history of malignant neoplasm of breast: Secondary | ICD-10-CM

## 2013-01-28 DIAGNOSIS — Z5111 Encounter for antineoplastic chemotherapy: Secondary | ICD-10-CM

## 2013-01-28 DIAGNOSIS — C7951 Secondary malignant neoplasm of bone: Secondary | ICD-10-CM

## 2013-01-28 DIAGNOSIS — C7952 Secondary malignant neoplasm of bone marrow: Secondary | ICD-10-CM

## 2013-01-28 DIAGNOSIS — C50919 Malignant neoplasm of unspecified site of unspecified female breast: Secondary | ICD-10-CM

## 2013-01-28 LAB — CBC WITH DIFFERENTIAL/PLATELET
BASO%: 0.7 % (ref 0.0–2.0)
Basophils Absolute: 0 10*3/uL (ref 0.0–0.1)
EOS%: 3.1 % (ref 0.0–7.0)
Eosinophils Absolute: 0.1 10*3/uL (ref 0.0–0.5)
HEMATOCRIT: 40.4 % (ref 34.8–46.6)
HEMOGLOBIN: 13.7 g/dL (ref 11.6–15.9)
LYMPH#: 1.2 10*3/uL (ref 0.9–3.3)
LYMPH%: 26.9 % (ref 14.0–49.7)
MCH: 29.8 pg (ref 25.1–34.0)
MCHC: 33.9 g/dL (ref 31.5–36.0)
MCV: 88 fL (ref 79.5–101.0)
MONO#: 0.5 10*3/uL (ref 0.1–0.9)
MONO%: 11.9 % (ref 0.0–14.0)
NEUT#: 2.6 10*3/uL (ref 1.5–6.5)
NEUT%: 57.4 % (ref 38.4–76.8)
Platelets: 187 10*3/uL (ref 145–400)
RBC: 4.59 10*6/uL (ref 3.70–5.45)
RDW: 12.4 % (ref 11.2–14.5)
WBC: 4.5 10*3/uL (ref 3.9–10.3)

## 2013-01-28 LAB — COMPREHENSIVE METABOLIC PANEL (CC13)
ALBUMIN: 3.9 g/dL (ref 3.5–5.0)
ALT: 25 U/L (ref 0–55)
ANION GAP: 10 meq/L (ref 3–11)
AST: 28 U/L (ref 5–34)
Alkaline Phosphatase: 82 U/L (ref 40–150)
BUN: 14.2 mg/dL (ref 7.0–26.0)
CHLORIDE: 105 meq/L (ref 98–109)
CO2: 23 meq/L (ref 22–29)
CREATININE: 0.9 mg/dL (ref 0.6–1.1)
Calcium: 10.5 mg/dL — ABNORMAL HIGH (ref 8.4–10.4)
Glucose: 109 mg/dl (ref 70–140)
Potassium: 4.2 mEq/L (ref 3.5–5.1)
Sodium: 139 mEq/L (ref 136–145)
TOTAL PROTEIN: 7.1 g/dL (ref 6.4–8.3)
Total Bilirubin: 0.33 mg/dL (ref 0.20–1.20)

## 2013-01-28 MED ORDER — ZOLEDRONIC ACID 4 MG/100ML IV SOLN
4.0000 mg | Freq: Once | INTRAVENOUS | Status: AC
  Administered 2013-01-28: 4 mg via INTRAVENOUS
  Filled 2013-01-28: qty 100

## 2013-01-28 MED ORDER — SODIUM CHLORIDE 0.9 % IV SOLN
INTRAVENOUS | Status: DC
  Administered 2013-01-28: 15:00:00 via INTRAVENOUS

## 2013-01-28 MED ORDER — FULVESTRANT 250 MG/5ML IM SOLN
500.0000 mg | Freq: Once | INTRAMUSCULAR | Status: AC
  Administered 2013-01-28: 500 mg via INTRAMUSCULAR
  Filled 2013-01-28: qty 10

## 2013-01-28 NOTE — Progress Notes (Signed)
Faslodex injection given to patient while getting infusion.

## 2013-02-02 ENCOUNTER — Encounter (HOSPITAL_COMMUNITY)
Admission: RE | Admit: 2013-02-02 | Discharge: 2013-02-02 | Disposition: A | Discharge status: Home / Self Care | Payer: Medicare Other | Source: Ambulatory Visit | Attending: Physician Assistant | Admitting: Physician Assistant

## 2013-02-02 ENCOUNTER — Ambulatory Visit: Payer: Medicare Other | Admitting: Physician Assistant

## 2013-02-02 DIAGNOSIS — Z853 Personal history of malignant neoplasm of breast: Secondary | ICD-10-CM

## 2013-02-02 DIAGNOSIS — C50919 Malignant neoplasm of unspecified site of unspecified female breast: Secondary | ICD-10-CM | POA: Insufficient documentation

## 2013-02-02 DIAGNOSIS — C7951 Secondary malignant neoplasm of bone: Secondary | ICD-10-CM | POA: Insufficient documentation

## 2013-02-02 DIAGNOSIS — C7952 Secondary malignant neoplasm of bone marrow: Secondary | ICD-10-CM

## 2013-02-02 LAB — GLUCOSE, CAPILLARY: Glucose-Capillary: 87 mg/dL (ref 70–99)

## 2013-02-02 MED ORDER — FLUDEOXYGLUCOSE F - 18 (FDG) INJECTION
18.5000 | Freq: Once | INTRAVENOUS | Status: AC | PRN
  Administered 2013-02-02: 18.5 via INTRAVENOUS

## 2013-02-03 ENCOUNTER — Telehealth: Payer: Self-pay | Admitting: Oncology

## 2013-02-03 ENCOUNTER — Ambulatory Visit (HOSPITAL_BASED_OUTPATIENT_CLINIC_OR_DEPARTMENT_OTHER): Payer: Medicare Other | Admitting: Oncology

## 2013-02-03 VITALS — BP 122/82 | HR 93 | Temp 97.7°F | Resp 18 | Ht 64.0 in | Wt 180.2 lb

## 2013-02-03 DIAGNOSIS — C50512 Malignant neoplasm of lower-outer quadrant of left female breast: Secondary | ICD-10-CM

## 2013-02-03 DIAGNOSIS — C7952 Secondary malignant neoplasm of bone marrow: Secondary | ICD-10-CM

## 2013-02-03 DIAGNOSIS — Z9010 Acquired absence of unspecified breast and nipple: Secondary | ICD-10-CM

## 2013-02-03 DIAGNOSIS — R0781 Pleurodynia: Secondary | ICD-10-CM

## 2013-02-03 DIAGNOSIS — C50919 Malignant neoplasm of unspecified site of unspecified female breast: Secondary | ICD-10-CM

## 2013-02-03 DIAGNOSIS — C7951 Secondary malignant neoplasm of bone: Secondary | ICD-10-CM

## 2013-02-03 NOTE — Progress Notes (Signed)
ID: Brooke [name]   DOB: 07-06-1950  MR#: [id number]  FEO#:[id number]  PCP: Irven Shelling, MD GYN: SU:  OTHER MD: Thea Silversmith, Gaynelle Arabian, Shanu [place] [phone])  CHIEF COMPLAINT:  "I'm feeling fine"  HISTORY OF PRESENT ILLNESS: Brooke [name] had screening mammography in August 04, 2007 which showed no specific mammographic evidence of malignancy, but she reported a left breast lump.  Accordingly, she was brought back on August 11, 2007 for mammography and ultrasonography.  There was indeed an obscured mass in the upper left breast which by ultrasound appeared to be a simple cyst.    Screening mammogram on August 04, 2008 showed in addition to dense breasts, again a possible mass in the left breast.  She had diagnostic mammography August 06, 2008 and additional views in addition to very dense breasts did not show any persistent mass or distortion.  Furthermore, Dr. Owens Shark was not able to palpate any abnormality in the lateral portion of the left breast.  Ultrasound showed normal appearing fibroglandular tissue in the area in question.    More recently, about September, the patient felt again something like a lump in the left breast and she had some pain associated with this.  This was initially felt simply to be mastalgia and was treated as such, but as it persisted, on December 23rd, the patient had left diagnostic mammography and ultrasonography.  There was a focally dense area in the left breast with very minimal distortion corresponding to the area of palpable abnormality. The ultrasound showed an irregular ill-defined, hypoechoic area with distal shadowing, measuring approximately 1 cm corresponding to the patient's palpable abnormality.    With this information, the patient was brought back for ultrasound-guided core biopsy and this was performed December 28th.  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  There was no evidence  of angiolymphatic invasion in the material submitted which measured 1.5 cm maximally.  In addition to the mass in the breast, a lymph node in the left axilla was biopsied and was likewise positive.  The tumor was ER positive at 89%, PR positive at 34% with an elevated MIB-1 at 44% and Her-2 negative with a ratio of 1.13.    Her subsequent history is as detailed below.  INTERVAL HISTORY: Brooke [name] returns today with her husband [name] for followup of her metastatic breast cancer. She has been on fulvestrant for the last 2 months, with good tolerance. She just had a PET scan which shows numerous areas of bony involvement not seen in the October scan. There is no extra osseous tumor seen. We reviewed the images and reports and discussed this extensively today  REVIEW OF SYSTEMS: Brooke [name] indeed feels fine. The residual pain in her left side is nearly gone. She is having no pain related to any other of her multiple bone lesions. She has a slight vaginal discharge which may be related to her radiation treatments earlier. There have been no unusual headaches, visual changes, cough, phlegm production, pleurisy, shortness of breath, or change in bowel or bladder habits. She has mild hot flashes. She is mildly fatigued. She has occasional leg cramps.  PAST MEDICAL HISTORY: Past Medical History  Diagnosis Date  . Hx Breast Cancer, IDC, Stahe III, receptor + 10/20/2010    left breast  . Breast cancer December 2010    invasive ductal and invasive lobular; bilateral mastectomy and radiation  . Mitral valve prolapse   . History of recurrent UTIs   .  Radiation 07/26/09-09/08/09    left breast  . Fracture, pelvis closed 11/2012    both hipsand number 7 rib, 15 rounds radiation  1. Remote migraines, currently inactive.   2. History of prior diagnosis of fibromyalgia made at [facility] and currently not active (she was treated with trimethoprim and nortriptyline for one year for this).    3. History of  recurrent UTIs.   4. History of benign left breast cyst aspirated in 2004 at [facility] under Dr. Melene Plan.   5. History of tonsillectomy and adenoidectomy.   6. History of right LASEK surgery. 7. History of bilateral reduction mammoplasties in 1994.   8. History of mitral valve prolapse diagnosed in 1984.  PAST SURGICAL HISTORY: Past Surgical History  Procedure Laterality Date  . Mastectomy Bilateral 2011    left breast cancer  . Lasik    . Reduction mammaplasty Bilateral 1994  . Tonsillectomy and adenoidectomy    . Aspiration of left breast Left 2004    [facility]/Dr.Teo    FAMILY HISTORY Family History  Problem Relation Age of Onset  . Lung cancer Father 28    smoker for 25 years  . Heart disease Maternal Grandmother   . Heart disease Maternal Grandfather   . Diabetes type I Mother   . Diabetes type I Sister   . Diabetes type I Sister   . Heart disease Maternal Uncle   . Uterine cancer Paternal Grandmother 61  . Breast cancer Other 66    paternal great grandmother  [name] died at the age of 75 from lung cancer.  He had been a remote smoker and had been in [employer] with a question of possible asbestos exposure.  [name] died at the age of 39 in the setting of Alzheimer's, coronary artery disease, diabetes and hypertension.  The patient had two sisters.  One died from complications of diabetes.  The other one is alive with diabetes.    GYNECOLOGIC HISTORY: She is GX P1.  First pregnancy to term at age 83.  The patient's last period was in 2008.  She took hormone replacement for about 18 months until mid-2008.  She had no complications from that treatment.    SOCIAL HISTORY: (Updated October 2014) She used to be a Arts development officer for [employer]  They used to live in the [place] area. 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  Her husband, [name], retired October 2012.  He was a  Arts development officer for 28 years.  Their son, [name], is currently with the [employer] at the [employer] in [place]. [name].  She attends [employer].   ADVANCED DIRECTIVES: in place  HEALTH MAINTENANCE: (Updated 11/19/2012) History  Substance Use Topics  . Smoking status: Former Smoker -- 1.00 packs/day for 11 years    Quit date: 01/23/1984  . Smokeless tobacco: Never Used  . Alcohol Use: Yes     Comment: wine, 3x/week     Colonoscopy: 2010  PAP: Jan 2014  Bone density: July 2014, osteopenia  Lipid panel: per Dr Valentina Lucks  Allergies  Allergen Reactions  . Sulfa Antibiotics Shortness Of Breath  . Codeine Nausea And Vomiting    Tolerates oxycodone if given with prochlorperazine for nausea    Current Outpatient Prescriptions  Medication Sig Dispense Refill  . ALPRAZolam (XANAX XR) 1 MG 24 hr tablet TAKE ONE TABLET BY MOUTH  DAILY. MAY INCREASE TO TWICE A DAY AS NEEDED.  28 tablet  0  . Calcium Citrate-Vitamin D (CITRACAL/VITAMIN D PO) Take by mouth daily.      [name] Calcium (STOOL SOFTENER PO) Take by mouth as needed.      . fulvestrant (FASLODEX) 250 MG/5ML injection Inject 250 mg into the muscle every 30 (thirty) days. One injection each buttock over 1-2 minutes. Warm prior to use.      . gabapentin (NEURONTIN) 300 MG capsule TAKE 1 CAPSULE (300 MG TOTAL) BY MOUTH AT BEDTIME.  90 capsule  1  . LORazepam (ATIVAN) 0.5 MG tablet TAKE 1 TABLET BY MOUTH 2 TIMES DAILY FOR ANXIETY  60 tablet  2  . naproxen (NAPROSYN) 500 MG tablet Take 1 tablet (500 mg total) by mouth 3 (three) times daily with meals as needed.  90 tablet  12  . ondansetron (ZOFRAN) 8 MG tablet       . oxyCODONE (OXY IR/ROXICODONE) 5 MG immediate release tablet Take 1 tablet (5 mg total) by mouth every 4 (four) hours as needed for severe pain.  30 tablet  0  . prochlorperazine (COMPAZINE) 10 MG tablet       . terconazole (TERAZOL 7) 0.4 % vaginal cream Place 1  applicator vaginally at bedtime.  45 g  0  . valACYclovir (VALTREX) 500 MG tablet Take 500 mg by mouth as needed.      . venlafaxine XR (EFFEXOR-XR) 37.5 MG 24 hr capsule Take 75 mg by mouth 2 (two) times daily.      . vitamin E 400 UNIT capsule Take 400 Units by mouth daily.      . Zoledronic Acid (ZOMETA IV) Inject into the vein every 30 (thirty) days.       No current facility-administered medications for this visit.     Filed Vitals:   02/03/13 1101  BP: 122/82  Pulse: 93  Temp: 97.7 F (36.5 C)  Resp: 18     Body mass index is 30.92 kg/(m^2).    ECOG FS: 1 Filed Weights   02/03/13 1101  Weight: 180 lb 3.2 oz (81.738 kg)   Objective: Middle-aged white woman in no acute distress  Sclerae unicteric, pupils equal and reactive Oropharynx clear and moist-- no thrush No cervical or supraclavicular adenopathy Lungs no rales or rhonchi Heart regular rate and rhythm Abd soft, nontender, positive bowel sounds, no masses palpated MSK no focal spinal tenderness to vigorous palpation and percussion; no upper extremity lymphedema; no rib cage tenderness to AP compression Neuro: nonfocal, well oriented, anxious affect Breasts: Status post bilateral mastectomies. There is no evidence of local recurrence. The left axilla is benign   LAB RESULTS: Lab Results  Component Value Date   WBC 4.5 01/28/2013   NEUTROABS 2.6 01/28/2013   HGB 13.7 01/28/2013   HCT 40.4 01/28/2013   MCV 88.0 01/28/2013   PLT 187 01/28/2013      Chemistry      Component Value Date/Time   NA 139 01/28/2013 1353   NA 142 06/26/2011 0728   NA 143 12/12/2010 1325   K 4.2 01/28/2013 1353   K 4.6 06/26/2011 0728   K 4.7 12/12/2010 1325   CL 105 01/07/2012 1347   CL 102 06/26/2011 0728   CL 105 12/12/2010 1325   CO2 23 01/28/2013 1353   CO2 30 06/26/2011 0728   CO2 27 12/12/2010 1325   BUN 14.2 01/28/2013 1353   BUN 12 06/26/2011 0728   BUN 14  12/12/2010 1325   CREATININE 0.9 01/28/2013 1353   CREATININE 1.1 06/26/2011 0728    CREATININE 0.96 12/12/2010 1325      Component Value Date/Time   CALCIUM 10.5* 01/28/2013 1353   CALCIUM 9.1 06/26/2011 0728   CALCIUM 10.1 12/12/2010 1325   ALKPHOS 82 01/28/2013 1353   ALKPHOS 54 12/12/2010 1325   AST 28 01/28/2013 1353   AST 19 12/12/2010 1325   ALT 25 01/28/2013 1353   ALT 19 12/12/2010 1325   BILITOT 0.33 01/28/2013 1353   BILITOT 0.3 12/12/2010 1325       Lab Results  Component Value Date   LABCA2 21 01/07/2012    STUDIES: Nm Pet Image Restag (ps) Skull Base To Thigh  02/02/2013   CLINICAL DATA:  Subsequent treatment strategy for breast cancer.  EXAM: NUCLEAR MEDICINE PET SKULL BASE TO THIGH  FASTING BLOOD GLUCOSE:  Value: 35m/dl  TECHNIQUE: 18.5 mCi F-18 FDG was injected intravenously. CT data was obtained and used for attenuation correction and anatomic localization only. (This was not acquired as a diagnostic CT examination.) Additional exam technical data entered on technologist worksheet.  COMPARISON:  11/05/2012  FINDINGS: NECK  No hypermetabolic lymph nodes in the neck.  CHEST  No hypermetabolic mediastinal or hilar nodes. No suspicious pulmonary nodules on the CT scan.  ABDOMEN/PELVIS  No abnormal hypermetabolic activity within the liver, pancreas, adrenal glands, or spleen. No hypermetabolic lymph nodes in the abdomen or pelvis.  SKELETON  There has been interval progression of multifocal hypermetabolic bone metastasis involving the axial and proximal appendicular skeleton. New lesions are identified throughout the lower cervical, thoracic and lumbar spine. There are also multiple new lesions involving both sides of the pelvis. For example, there is abnormal increased uptake within the left iliac wing. This has an SUV max equal to 6.5, image 188. Previously the SUV max was equal to 3.4. Lesion involving the posterior a right iliac wing has an SUV max equal to 6.4, image 180. Previously the SUV max was equal to 3.7. Partial healing of lytic lesion involving the anterior  column of the right acetabulum which may have been complicated by pathologic fracture, image 221/series 2.  IMPRESSION: 1. Interval progression of multi focal skeletal metastases. 2. No evidence for extraosseous metastatic disease.   Electronically Signed   By: TKerby MoorsM.D.   On: 02/02/2013 14:34    ASSESSMENT: 89y.[id number]. [place] woman   (1)  status post Left breast and Left axillary lymph node biopsy Dec 2010 for a cT3 pN1 (stage IIIA) invasive ductal carcinoma, grade 2, estrogen and progesterone receptor positive, HER-2 negative, with an MIB-1 of 44%;   (2)  treated neoadjuvantly with dose dense cyclophosphamide and doxorubicin x4 followed by weekly paclitaxel x7  (3) s/p bilateral mastectomies May 2011. She had a residual 1.5 cm tumor, and one of 8 sampled lymph nodes was positive [ypT1c ypN1]. Margins were negative.   (4)  She completed radiation in August of 2011   (5) on letrozole between August 2011 and October 2014  (6) PET scan 11/05/2012 obtained to evaluate right chest wall pain documents bony metastatic disease to the right seventh rib, L1, and the right acetabulum with an associated pathologic fracture through the anterior cortex of the acetabulum measuring 3.2 cm. There was no evidence of extra osseous metastatic disease.  (7)  as of 11/05/2012, receiving fulvestrant injections, 500 mg monthly, in addition to monthly infusions of zoledronic acid.  (8) Right rib, left hip and right hip  were all treated to 37.5 Gy in 15 fractions at 2.5 Gy per fraction completed 12/17/2012    PLAN:  We spent the better part of today's 45 minute visit reviewing the results of her scan and discussing alternatives. The PET scan certainly shows numerous spots particularly along the spine that we did not see in October. On the other hand she has received zolendronate since that time, and it is possible that those spots were there in October but were primarily lytic, and now that she is receiving  zolendronate we are seeing more activity, which may actually represent improvement. In support of this possibility is the fact that her alkaline phosphatase is normal and she has absolutely no symptoms regarding her bony involvement. If this were the case, we would simply continue the fulvestrant another 2 months and repeat the PET scan to confirm.  Of course this could indeed represent rapid progression. If that is the case then likely we should proceed to a bone marrow biopsy to confirm the tumor is still estrogen receptor positive. Options there would include tamoxifen or more likely exemestane/ everolimus. Of course if the tumor had become estrogen and progesterone receptor negative there would be numerous possible chemotherapy options.  Brooke [name] is going to be at [facility] later this week. We have faxed them all the information and I also gave Brooke [name] a copy just in case things get lost somewhere along the way. I also gave her my cell number her to give to Dr. Benjie Karvonen to facilitate communication. I have made Brooke [name] a return appointment here for January 23 so we can discuss and implement any plans for changes suggested by Dr. Benjie Karvonen. Brooke [name] knows to call for any problems that may develop before that visit.   Chauncey Cruel, MD 02/03/2013

## 2013-02-04 ENCOUNTER — Ambulatory Visit: Payer: Medicare Other | Admitting: Oncology

## 2013-02-05 ENCOUNTER — Encounter: Payer: Self-pay | Admitting: Physician Assistant

## 2013-02-05 ENCOUNTER — Encounter: Payer: Self-pay | Admitting: *Deleted

## 2013-02-05 NOTE — Progress Notes (Signed)
Received the message from [place].  Called patient for clarification.  Reports "all other medications have been discontinued.  There is no change in the dosages or directions.  The vitamin E should be vitamin D at the same dose.  I stopped radiation so I stopped a lot of medications."  Medication list updated at this time as directed by [facility].     Hi Brooke [name], Can you direct my medication list to the proper person in [place] to update my record. The list below are medications that should be deleted: prochlorperazine ondansetron stool softener terconazole oxycodone lorazepam Vitamin E on the list should be changed to be Vitamin D. Thank you, Brooke [name]

## 2013-02-05 NOTE — Addendum Note (Signed)
Addended by: Cherylynn Ridges on: 02/05/2013 06:08 PM   Modules accepted: Orders

## 2013-02-09 ENCOUNTER — Encounter: Payer: Self-pay | Admitting: *Deleted

## 2013-02-09 NOTE — Progress Notes (Signed)
Pt called this RN and wanted to update med list due to changes not noted per last visit.  Medications reviewed and updated per call.  Pt also requested if MD had earlier appt per currently scheduled for 1/23.  Noted opening on 1/21- appointment rescheduled by this RN.

## 2013-02-11 ENCOUNTER — Ambulatory Visit (HOSPITAL_BASED_OUTPATIENT_CLINIC_OR_DEPARTMENT_OTHER): Payer: Medicare Other | Admitting: Oncology

## 2013-02-11 ENCOUNTER — Telehealth: Payer: Self-pay | Admitting: Oncology

## 2013-02-11 VITALS — BP 122/78 | HR 98 | Temp 98.5°F | Resp 20 | Ht 64.0 in | Wt 182.3 lb

## 2013-02-11 DIAGNOSIS — Z9010 Acquired absence of unspecified breast and nipple: Secondary | ICD-10-CM

## 2013-02-11 DIAGNOSIS — C773 Secondary and unspecified malignant neoplasm of axilla and upper limb lymph nodes: Secondary | ICD-10-CM

## 2013-02-11 DIAGNOSIS — C50512 Malignant neoplasm of lower-outer quadrant of left female breast: Secondary | ICD-10-CM

## 2013-02-11 DIAGNOSIS — C50919 Malignant neoplasm of unspecified site of unspecified female breast: Secondary | ICD-10-CM

## 2013-02-11 DIAGNOSIS — Z170 Estrogen receptor positive status [ER+]: Secondary | ICD-10-CM

## 2013-02-11 DIAGNOSIS — C7952 Secondary malignant neoplasm of bone marrow: Secondary | ICD-10-CM

## 2013-02-11 DIAGNOSIS — C7951 Secondary malignant neoplasm of bone: Secondary | ICD-10-CM

## 2013-02-11 NOTE — Progress Notes (Signed)
ID: Brooke [name]   DOB: 07-Nov-1950  MR#: [id number]  EHU#:[id number]  PCP: Irven Shelling, MD GYN: SU:  OTHER MD: Thea Silversmith, Gaynelle Arabian, Shanu Bootjack [phone])  CHIEF COMPLAINT:  "We had a good visit at [facility]".  HISTORY OF PRESENT ILLNESS: Brooke [name] had screening mammography in August 04, 2007 which showed no specific mammographic evidence of malignancy, but she reported a left breast lump.  Accordingly, she was brought back on August 11, 2007 for mammography and ultrasonography.  There was indeed an obscured mass in the upper left breast which by ultrasound appeared to be a simple cyst.    Screening mammogram on August 04, 2008 showed in addition to dense breasts, again a possible mass in the left breast.  She had diagnostic mammography August 06, 2008 and additional views in addition to very dense breasts did not show any persistent mass or distortion.  Furthermore, Dr. Owens Shark was not able to palpate any abnormality in the lateral portion of the left breast.  Ultrasound showed normal appearing fibroglandular tissue in the area in question.    More recently, about September, the patient felt again something like a lump in the left breast and she had some pain associated with this.  This was initially felt simply to be mastalgia and was treated as such, but as it persisted, on December 23rd, the patient had left diagnostic mammography and ultrasonography.  There was a focally dense area in the left breast with very minimal distortion corresponding to the area of palpable abnormality. The ultrasound showed an irregular ill-defined, hypoechoic area with distal shadowing, measuring approximately 1 cm corresponding to the patient's palpable abnormality.    With this information, the patient was brought back for ultrasound-guided core biopsy and this was performed December 28th.  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  There was no evidence of angiolymphatic invasion in the material submitted which measured 1.5 cm maximally.  In addition to the mass in the breast, a lymph node in the left axilla was biopsied and was likewise positive.  The tumor was ER positive at 89%, PR positive at 34% with an elevated MIB-1 at 44% and Her-2 negative with a ratio of 1.13.    Her subsequent history is as detailed below.  INTERVAL HISTORY: Brooke [name] returns today with her husband [name] for followup of her metastatic breast cancer. Since her last visit here she was evaluated at [facility] by Dr. Benjie Karvonen. The patient's records were extensively reviewed, and the feeling was that we should continue the fulvestrant for now, and reassess with a repeat scan after 2 months. This will allow [place] to assess whether what we saw in the earlier her PET scan was a "flare" or actually represents progression. Dr. Benjie Karvonen also suggested, if we decide to do a bone marrow biopsy, to send a not decalcified core to [facility] for extensive genetic testing  REVIEW OF SYSTEMS: Brooke [name] feels "terrific ". She is very encouraged after her [place] visit. She has minimal pain score 0 there", but nothing demanding attention right now. She is very eager to proceed to bone marrow biopsy. A detailed review of systems today was otherwise noncontributory  PAST MEDICAL HISTORY: Past Medical History  Diagnosis Date  . Hx Breast Cancer, IDC, Stahe III, receptor + 10/20/2010    left breast  . Breast cancer December 2010    invasive ductal and invasive lobular; bilateral mastectomy and radiation  .  Mitral valve prolapse   . History of recurrent UTIs   . Radiation 07/26/09-09/08/09    left breast  . Fracture, pelvis closed 11/2012    both hipsand number 7 rib, 15 rounds radiation  1. Remote migraines, currently inactive.   2. History of prior diagnosis of fibromyalgia made at [facility] and currently not active (she  was treated with trimethoprim and nortriptyline for one year for this).    3. History of recurrent UTIs.   4. History of benign left breast cyst aspirated in 2004 at [facility] under Dr. Melene Plan.   5. History of tonsillectomy and adenoidectomy.   6. History of right LASEK surgery. 7. History of bilateral reduction mammoplasties in 1994.   8. History of mitral valve prolapse diagnosed in 1984.  PAST SURGICAL HISTORY: Past Surgical History  Procedure Laterality Date  . Mastectomy Bilateral 2011    left breast cancer  . Lasik    . Reduction mammaplasty Bilateral 1994  . Tonsillectomy and adenoidectomy    . Aspiration of left breast Left 2004    [facility]/Dr.Teo    FAMILY HISTORY Family History  Problem Relation Age of Onset  . Lung cancer Father 50    smoker for 25 years  . Heart disease Maternal Grandmother   . Heart disease Maternal Grandfather   . Diabetes type I Mother   . Diabetes type I Sister   . Diabetes type I Sister   . Heart disease Maternal Uncle   . Uterine cancer Paternal Grandmother 45  . Breast cancer Other 3    paternal great grandmother  [name] died at the age of 50 from lung cancer.  He had been a remote smoker and had been in [employer] with a question of possible asbestos exposure.  [name] died at the age of 52 in the setting of Alzheimer's, coronary artery disease, diabetes and hypertension.  The patient had two sisters.  One died from complications of diabetes.  The other one is alive with diabetes.    GYNECOLOGIC HISTORY: She is GX P1.  First pregnancy to term at age 30.  The patient's last period was in 2008.  She took hormone replacement for about 18 months until mid-2008.  She had no complications from that treatment.    SOCIAL HISTORY: (Updated October 2014) She used to be a Arts development officer for [employer]  They used to live in the [place] area. 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  Her husband, [name], retired October 2012.  He was a Arts development officer for 28 years.  Their son, [name], is currently with the [employer] at the [employer] in [place]. [name].  She attends [employer].   ADVANCED DIRECTIVES: in place  HEALTH MAINTENANCE: (Updated 11/19/2012) History  Substance Use Topics  . Smoking status: Former Smoker -- 1.00 packs/day for 11 years    Quit date: 01/23/1984  . Smokeless tobacco: Never Used  . Alcohol Use: Yes     Comment: wine, 3x/week     Colonoscopy: 2010  PAP: Jan 2014  Bone density: July 2014, osteopenia  Lipid panel: per Dr Valentina Lucks  Allergies  Allergen Reactions  . Sulfa Antibiotics Shortness Of Breath  . Codeine Nausea And Vomiting    Tolerates oxycodone if given with prochlorperazine for nausea    Current Outpatient Prescriptions  Medication Sig Dispense Refill  .  ALPRAZolam (XANAX XR) 1 MG 24 hr tablet Take 1 mg by mouth daily. MAY TAKE BID IF NEEDED      . Calcium Citrate (CITRACAL PO) Take 3 tablets by mouth daily.      . cholecalciferol (VITAMIN D) 400 UNITS TABS tablet Take 400 Units by mouth daily. Patient reportd the Vitamin E should be vitamin D with same dose      . fulvestrant (FASLODEX) 250 MG/5ML injection Inject 250 mg into the muscle every 30 (thirty) days. One injection each buttock over 1-2 minutes. Warm prior to use.      . gabapentin (NEURONTIN) 300 MG capsule       . naproxen (NAPROSYN) 500 MG tablet       . venlafaxine XR (EFFEXOR-XR) 37.5 MG 24 hr capsule       . Zoledronic Acid (ZOMETA IV) Inject into the vein every 30 (thirty) days.       No current facility-administered medications for this visit.     Filed Vitals:   02/11/13 1323  BP: 122/78  Pulse: 98  Temp: 98.5 F (36.9 C)  Resp: 20     Body mass index is 31.28 kg/(m^2).    ECOG FS: 1 Filed Weights   02/11/13 1323  Weight: 182 lb 4.8 oz (82.691 kg)    Objective: Middle-aged white woman who appears well  Sclerae unicteric, pupils equal and round Oropharynx clear and moist-- no thrush or other lesions No cervical or supraclavicular adenopathy Lungs no rales or rhonchi Heart regular rate and rhythm Abd soft, nontender, positive bowel sounds, no masses palpated MSK no focal spinal tenderness; no upper extremity lymphedema; no rib cage tenderness  Neuro: nonfocal, well oriented, positive affect Breasts: Status post bilateral mastectomies. There is no evidence of local recurrence. The left axilla is benign   LAB RESULTS: Lab Results  Component Value Date   WBC 4.5 01/28/2013   NEUTROABS 2.6 01/28/2013   HGB 13.7 01/28/2013   HCT 40.4 01/28/2013   MCV 88.0 01/28/2013   PLT 187 01/28/2013      Chemistry      Component Value Date/Time   NA 139 01/28/2013 1353   NA 142 06/26/2011 0728   NA 143 12/12/2010 1325   K 4.2 01/28/2013 1353   K 4.6 06/26/2011 0728   K 4.7 12/12/2010 1325   CL 105 01/07/2012 1347   CL 102 06/26/2011 0728   CL 105 12/12/2010 1325   CO2 23 01/28/2013 1353   CO2 30 06/26/2011 0728   CO2 27 12/12/2010 1325   BUN 14.2 01/28/2013 1353   BUN 12 06/26/2011 0728   BUN 14 12/12/2010 1325   CREATININE 0.9 01/28/2013 1353   CREATININE 1.1 06/26/2011 0728   CREATININE 0.96 12/12/2010 1325      Component Value Date/Time   CALCIUM 10.5* 01/28/2013 1353   CALCIUM 9.1 06/26/2011 0728   CALCIUM 10.1 12/12/2010 1325   ALKPHOS 82 01/28/2013 1353   ALKPHOS 54 12/12/2010 1325   AST 28 01/28/2013 1353   AST 19 12/12/2010 1325   ALT 25 01/28/2013 1353   ALT 19 12/12/2010 1325   BILITOT 0.33 01/28/2013 1353   BILITOT 0.3 12/12/2010 1325       Lab Results  Component Value Date   LABCA2 21 01/07/2012    STUDIES: Nm Pet Image Restag (ps) Skull Base To Thigh  02/02/2013   CLINICAL DATA:  Subsequent treatment strategy for breast cancer.  EXAM: NUCLEAR MEDICINE PET SKULL BASE TO THIGH  FASTING  BLOOD GLUCOSE:  Value: 6m/dl  TECHNIQUE: 18.5 mCi F-18  FDG was injected intravenously. CT data was obtained and used for attenuation correction and anatomic localization only. (This was not acquired as a diagnostic CT examination.) Additional exam technical data entered on technologist worksheet.  COMPARISON:  11/05/2012  FINDINGS: NECK  No hypermetabolic lymph nodes in the neck.  CHEST  No hypermetabolic mediastinal or hilar nodes. No suspicious pulmonary nodules on the CT scan.  ABDOMEN/PELVIS  No abnormal hypermetabolic activity within the liver, pancreas, adrenal glands, or spleen. No hypermetabolic lymph nodes in the abdomen or pelvis.  SKELETON  There has been interval progression of multifocal hypermetabolic bone metastasis involving the axial and proximal appendicular skeleton. New lesions are identified throughout the lower cervical, thoracic and lumbar spine. There are also multiple new lesions involving both sides of the pelvis. For example, there is abnormal increased uptake within the left iliac wing. This has an SUV max equal to 6.5, image 188. Previously the SUV max was equal to 3.4. Lesion involving the posterior a right iliac wing has an SUV max equal to 6.4, image 180. Previously the SUV max was equal to 3.7. Partial healing of lytic lesion involving the anterior column of the right acetabulum which may have been complicated by pathologic fracture, image 221/series 2.  IMPRESSION: 1. Interval progression of multi focal skeletal metastases. 2. No evidence for extraosseous metastatic disease.   Electronically Signed   By: TKerby MoorsM.D.   On: 02/02/2013 14:34    ASSESSMENT: 63y.o. [place] woman   (1)  status post Left breast and Left axillary lymph node biopsy Dec 2010 for a cT3 pN1 (stage IIIA) invasive ductal carcinoma, grade 2, estrogen and progesterone receptor positive, HER-2 negative, with an MIB-1 of 44%;   (2)  treated neoadjuvantly with dose dense cyclophosphamide and doxorubicin x4 followed by weekly paclitaxel x7  (3) s/p  bilateral mastectomies May 2011. She had a residual 1.5 cm tumor, and one of 8 sampled lymph nodes was positive [ypT1c ypN1]. Margins were negative.   (4)  She completed radiation in August of 2011   (5) on letrozole between August 2011 and October 2014  (6) PET scan 11/05/2012 obtained to evaluate right chest wall pain documents bony metastatic disease to the right seventh rib, L1, and the right acetabulum with an associated pathologic fracture through the anterior cortex of the acetabulum measuring 3.2 cm. There was no evidence of extra osseous metastatic disease.  (7)  as of 11/05/2012, receiving fulvestrant injections, 500 mg monthly, in addition to monthly infusions of zoledronic acid.  (8) Right rib, left hip and right hip were all treated to 37.5 Gy in 15 fractions at 2.5 Gy per fraction completed 12/17/2012    PLAN:  We discussed the benefits as well as the possible complications of bone biopsy, and this has been set up for next week. I have discussed with pathology the procedure [place] with interventional at the time of the procedure. Of course we will also send a core for routine breast prognostic studies. That is a good understanding of the overall plan. She wanted to do the PET scan sooner rather than later, but after much discussion we have scheduled that for 03-2013. She will see me 2 days later to discuss results.  That is a good understanding of the overall plan. She knows to call for any problems that may develop  before her next visit here.   Chauncey Cruel, MD 02/11/2013

## 2013-02-11 NOTE — Telephone Encounter (Signed)
, °

## 2013-02-12 ENCOUNTER — Other Ambulatory Visit (HOSPITAL_COMMUNITY): Payer: Self-pay | Admitting: Radiology

## 2013-02-13 ENCOUNTER — Ambulatory Visit: Payer: Medicare Other | Admitting: Oncology

## 2013-02-18 ENCOUNTER — Encounter (HOSPITAL_COMMUNITY): Payer: Self-pay | Admitting: Pharmacy Technician

## 2013-02-19 ENCOUNTER — Ambulatory Visit (HOSPITAL_COMMUNITY)
Admission: RE | Admit: 2013-02-19 | Discharge: 2013-02-19 | Disposition: A | Discharge status: Home / Self Care | Payer: Medicare Other | Source: Ambulatory Visit | Attending: Oncology | Admitting: Oncology

## 2013-02-19 ENCOUNTER — Other Ambulatory Visit: Payer: Self-pay | Admitting: Interventional Radiology

## 2013-02-19 ENCOUNTER — Encounter (HOSPITAL_COMMUNITY): Payer: Self-pay

## 2013-02-19 DIAGNOSIS — C50512 Malignant neoplasm of lower-outer quadrant of left female breast: Secondary | ICD-10-CM

## 2013-02-19 DIAGNOSIS — Z9010 Acquired absence of unspecified breast and nipple: Secondary | ICD-10-CM | POA: Insufficient documentation

## 2013-02-19 DIAGNOSIS — C50919 Malignant neoplasm of unspecified site of unspecified female breast: Secondary | ICD-10-CM | POA: Insufficient documentation

## 2013-02-19 DIAGNOSIS — C7951 Secondary malignant neoplasm of bone: Secondary | ICD-10-CM

## 2013-02-19 DIAGNOSIS — I059 Rheumatic mitral valve disease, unspecified: Secondary | ICD-10-CM | POA: Insufficient documentation

## 2013-02-19 HISTORY — PX: BONE BIOPSY: SHX375

## 2013-02-19 LAB — CBC
HCT: 36.9 % (ref 36.0–46.0)
Hemoglobin: 12.7 g/dL (ref 12.0–15.0)
MCH: 30.2 pg (ref 26.0–34.0)
MCHC: 34.4 g/dL (ref 30.0–36.0)
MCV: 87.6 fL (ref 78.0–100.0)
PLATELETS: 177 10*3/uL (ref 150–400)
RBC: 4.21 MIL/uL (ref 3.87–5.11)
RDW: 12.2 % (ref 11.5–15.5)
WBC: 4 10*3/uL (ref 4.0–10.5)

## 2013-02-19 LAB — PROTIME-INR
INR: 0.95 (ref 0.00–1.49)
Prothrombin Time: 12.5 seconds (ref 11.6–15.2)

## 2013-02-19 LAB — APTT: APTT: 25 s (ref 24–37)

## 2013-02-19 MED ORDER — HYDROCODONE-ACETAMINOPHEN 5-325 MG PO TABS
1.0000 | ORAL_TABLET | ORAL | Status: DC | PRN
  Filled 2013-02-19: qty 2

## 2013-02-19 MED ORDER — FENTANYL CITRATE 0.05 MG/ML IJ SOLN
INTRAMUSCULAR | Status: AC | PRN
  Administered 2013-02-19: 100 ug via INTRAVENOUS

## 2013-02-19 MED ORDER — FENTANYL CITRATE 0.05 MG/ML IJ SOLN
INTRAMUSCULAR | Status: AC
  Filled 2013-02-19: qty 4

## 2013-02-19 MED ORDER — MIDAZOLAM HCL 2 MG/2ML IJ SOLN
INTRAMUSCULAR | Status: AC
  Filled 2013-02-19: qty 4

## 2013-02-19 MED ORDER — MIDAZOLAM HCL 2 MG/2ML IJ SOLN
INTRAMUSCULAR | Status: AC | PRN
  Administered 2013-02-19: 1 mg via INTRAVENOUS
  Administered 2013-02-19: 2 mg via INTRAVENOUS

## 2013-02-19 MED ORDER — SODIUM CHLORIDE 0.9 % IV SOLN
Freq: Once | INTRAVENOUS | Status: AC
  Administered 2013-02-19: 10:00:00 via INTRAVENOUS

## 2013-02-19 NOTE — Discharge Instructions (Signed)
Conscious Sedation, Adult, Care After Refer to this sheet in the next few weeks. These instructions provide you with information on caring for yourself after your procedure. Your health care provider may also give you more specific instructions. Your treatment has been planned according to current medical practices, but problems sometimes occur. Call your health care provider if you have any problems or questions after your procedure. WHAT TO EXPECT AFTER THE PROCEDURE  After your procedure:  You may feel sleepy, clumsy, and have poor balance for several hours.  Vomiting may occur if you eat too soon after the procedure. HOME CARE INSTRUCTIONS  Do not participate in any activities where you could become injured for at least 24 hours. Do not:  Drive.  Swim.  Ride a bicycle.  Operate heavy machinery.  Cook.  Use power tools.  Climb ladders.  Work from a high place.  Do not make important decisions or sign legal documents until you are improved.  If you vomit, drink water, juice, or soup when you can drink without vomiting. Make sure you have little or no nausea before eating solid foods.  Only take over-the-counter or prescription medicines for pain, discomfort, or fever as directed by your health care provider.  Make sure you and your family fully understand everything about the medicines given to you, including what side effects may occur.  You should not drink alcohol, take sleeping pills, or take medicines that cause drowsiness for at least 24 hours.  If you smoke, do not smoke without supervision.  If you are feeling better, you may resume normal activities 24 hours after you were sedated.  Keep all appointments with your health care provider. SEEK MEDICAL CARE IF:  Your skin is pale or bluish in color.  You continue to feel nauseous or vomit.  Your pain is getting worse and is not helped by medicine.  You have bleeding or swelling.  You are still sleepy or  feeling clumsy after 24 hours. SEEK IMMEDIATE MEDICAL CARE IF:  You develop a rash.  You have difficulty breathing.  You develop any type of allergic problem.  You have a fever. MAKE SURE YOU:  Understand these instructions.  Will watch your condition.  Will get help right away if you are not doing well or get worse. Document Released: 10/29/2012 Document Reviewed: 08/15/2012 [facility] Patient Information 2014 [place], [place]. Bone Biopsy, Open, Care After Refer to this sheet in the next few weeks. These instructions provide you with information on caring for yourself after your procedure. Your caregiver may also give you more specific instructions. Your treatment has been planned according to current medical practices, but problems sometimes occur. Call your caregiver if you have any problems or questions after your procedure. HOME CARE INSTRUCTIONS   Keep your surgery site clean and dry.  You may shower and bathe normally unless instructed otherwise by your surgeon.  Pat the wound dry and re-bandage it after cleansing or care for as instructed.  Protect the wound until your caregiver advises otherwise. Finding out the results of your test Ask when your test results will be ready. Make sure you get your test results. SEEK MEDICAL CARE IF:   You have redness, swelling, or increasing pain in the wound.  You have pus coming from the wound.  You have drainage from a wound lasting longer than 1 day.  You notice a bad smell coming from the wound or dressing.  Your wound edges break apart after sutures or staples have been  removed.  You develop persistent nausea or vomiting. SEEK IMMEDIATE MEDICAL CARE IF:   You have a fever.  You develop a rash.  You have difficulty breathing.  You develop any reaction or side effects to medicine given. Document Released: 07/28/2004 Document Revised: 10/29/2012 Document Reviewed: 06/16/2008 [facility] Patient Information 2014  [place], [place].

## 2013-02-19 NOTE — Procedures (Signed)
CT core R iliac bone lesion x4 No complication No blood loss. See complete dictation in [facility].

## 2013-02-19 NOTE — H&P (Signed)
Brooke [name] is an 63 y.o. female.   Chief Complaint: "I'm having a bone biopsy" HPI: Patient with history of metastatic breast carcinoma and recent imaging revealing progression of multifocal hypermetabolic bone metastases presents today for CT guided iliac crest biopsy.                                                                                                                                                                                        Past Medical History  Diagnosis Date  . Hx Breast Cancer, IDC, Stahe III, receptor + 10/20/2010    left breast  . Breast cancer December 2010    invasive ductal and invasive lobular; bilateral mastectomy and radiation  . Mitral valve prolapse   . History of recurrent UTIs   . Radiation 07/26/09-09/08/09    left breast  . Fracture, pelvis closed 11/2012    both hipsand number 7 rib, 15 rounds radiation    Past Surgical History  Procedure Laterality Date  . Mastectomy Bilateral 2011    left breast cancer  . Lasik    . Reduction mammaplasty Bilateral 1994  . Tonsillectomy and adenoidectomy    . Aspiration of left breast Left 2004    [facility]/Dr.Teo    Family History  Problem Relation Age of Onset  . Lung cancer Father 18    smoker for 25 years  . Heart disease Maternal Grandmother   . Heart disease Maternal Grandfather   . Diabetes type I Mother   . Diabetes type I Sister   . Diabetes type I Sister   . Heart disease Maternal Uncle   . Uterine cancer Paternal Grandmother 52  . Breast cancer Other 28    paternal great grandmother   Social History:  reports that she quit smoking about 29 years ago. She has never used smokeless tobacco. She reports that she drinks alcohol. She reports that she does not use illicit drugs.  Allergies:  Allergies  Allergen Reactions  . Sulfa Antibiotics Shortness Of Breath  . Codeine Nausea And Vomiting    Tolerates oxycodone if given with prochlorperazine for nausea     Current outpatient prescriptions:ALPRAZolam (XANAX XR) 1 MG 24 hr tablet, Take 1 mg by mouth 2 (two) times daily as needed for anxiety. , Disp: , Rfl: ;  Calcium Citrate (CITRACAL PO), Take 3 tablets by mouth daily., Disp: , Rfl: ;  cholecalciferol (VITAMIN D) 400 UNITS TABS tablet, Take 400 Units by mouth daily. Patient reportd the Vitamin E should be vitamin D with same dose, Disp: , Rfl:  diphenhydrAMINE (BENADRYL) 25 mg capsule, Take 25 mg by mouth at bedtime as needed  for sleep. , Disp: , Rfl: ;  fulvestrant (FASLODEX) 250 MG/5ML injection, Inject 250 mg into the muscle every 30 (thirty) days. One injection each buttock over 1-2 minutes. Warm prior to use., Disp: , Rfl: ;  gabapentin (NEURONTIN) 300 MG capsule, Take 300 mg by mouth at bedtime. , Disp: , Rfl:  naproxen (NAPROSYN) 500 MG tablet, Take 500 mg by mouth 2 (two) times daily as needed for moderate pain. , Disp: , Rfl: ;  Polyethyl Glycol-Propyl Glycol (SYSTANE) 0.4-0.3 % SOLN, Apply 1 drop to eye 2 (two) times daily as needed (dry eyes)., Disp: , Rfl: ;  venlafaxine XR (EFFEXOR-XR) 37.5 MG 24 hr capsule, Take 75 mg by mouth daily with breakfast. , Disp: , Rfl:  Zoledronic Acid (ZOMETA IV), Inject into the vein every 30 (thirty) days., Disp: , Rfl:  Current facility-administered medications:0.9 %  sodium chloride infusion, , Intravenous, Once, Lavonia Drafts, PA-C  02/19/2013 LABS PENDING Review of Systems  Constitutional: Negative for fever and chills.  Respiratory: Negative for cough and shortness of breath.   Cardiovascular: Negative for chest pain.  Gastrointestinal: Negative for nausea, vomiting and abdominal pain.  Musculoskeletal:       Occ rt hip pain  Neurological: Positive for headaches.  Endo/Heme/Allergies: Does not bruise/bleed easily.   Vitals: BP 110/58  HR 73  R 18  TEMP 98.1  O2 SATS 98% RA Last menstrual period 01/22/2006. Physical Exam  Constitutional: She is oriented to person, place, and time. She appears  well-developed and well-nourished.  Cardiovascular: Normal rate and regular rhythm.   Respiratory: Effort normal and breath sounds normal.  GI: Soft. Bowel sounds are normal. There is no tenderness.  Musculoskeletal: Normal range of motion. She exhibits no edema.  Neurological: She is alert and oriented to person, place, and time.     Assessment/Plan Patient with history of metastatic breast carcinoma and recent imaging revealing progression of multifocal hypermetabolic bone metastases presents today for CT guided iliac crest biopsy. Details/risks of procedure d/w pt/husband with their understanding and consent.  ALLRED,D KEVIN 02/19/2013, 9:14 AM

## 2013-02-24 ENCOUNTER — Telehealth: Payer: Self-pay | Admitting: *Deleted

## 2013-02-24 NOTE — Telephone Encounter (Signed)
This RN spoke with pt per her call inquiring about results of bone biopsy.  Informed her currently only partial results available showing known cancer but additional prognostic factors would not be available until later this week.  Brooke [name] will call this RN on Friday 2/6 if she has not been called with further information.

## 2013-02-25 ENCOUNTER — Ambulatory Visit (HOSPITAL_BASED_OUTPATIENT_CLINIC_OR_DEPARTMENT_OTHER): Payer: Medicare Other

## 2013-02-25 ENCOUNTER — Ambulatory Visit: Payer: Medicare Other

## 2013-02-25 ENCOUNTER — Other Ambulatory Visit (HOSPITAL_BASED_OUTPATIENT_CLINIC_OR_DEPARTMENT_OTHER): Payer: Medicare Other

## 2013-02-25 ENCOUNTER — Other Ambulatory Visit: Payer: Medicare Other | Admitting: Lab

## 2013-02-25 ENCOUNTER — Other Ambulatory Visit: Payer: Self-pay | Admitting: Oncology

## 2013-02-25 VITALS — BP 109/75 | HR 100 | Temp 98.3°F | Resp 20

## 2013-02-25 DIAGNOSIS — Z853 Personal history of malignant neoplasm of breast: Secondary | ICD-10-CM

## 2013-02-25 DIAGNOSIS — C50919 Malignant neoplasm of unspecified site of unspecified female breast: Secondary | ICD-10-CM

## 2013-02-25 DIAGNOSIS — Z5111 Encounter for antineoplastic chemotherapy: Secondary | ICD-10-CM

## 2013-02-25 DIAGNOSIS — C7952 Secondary malignant neoplasm of bone marrow: Secondary | ICD-10-CM

## 2013-02-25 DIAGNOSIS — C7951 Secondary malignant neoplasm of bone: Secondary | ICD-10-CM

## 2013-02-25 DIAGNOSIS — C50512 Malignant neoplasm of lower-outer quadrant of left female breast: Secondary | ICD-10-CM

## 2013-02-25 LAB — CBC WITH DIFFERENTIAL/PLATELET
BASO%: 0.5 % (ref 0.0–2.0)
BASOS ABS: 0 10*3/uL (ref 0.0–0.1)
EOS%: 3.2 % (ref 0.0–7.0)
Eosinophils Absolute: 0.1 10*3/uL (ref 0.0–0.5)
HCT: 38.4 % (ref 34.8–46.6)
HEMOGLOBIN: 12.9 g/dL (ref 11.6–15.9)
LYMPH%: 27.6 % (ref 14.0–49.7)
MCH: 29.7 pg (ref 25.1–34.0)
MCHC: 33.6 g/dL (ref 31.5–36.0)
MCV: 88.5 fL (ref 79.5–101.0)
MONO#: 0.3 10*3/uL (ref 0.1–0.9)
MONO%: 8.3 % (ref 0.0–14.0)
NEUT#: 2.3 10*3/uL (ref 1.5–6.5)
NEUT%: 60.4 % (ref 38.4–76.8)
Platelets: 174 10*3/uL (ref 145–400)
RBC: 4.34 10*6/uL (ref 3.70–5.45)
RDW: 12.3 % (ref 11.2–14.5)
WBC: 3.7 10*3/uL — ABNORMAL LOW (ref 3.9–10.3)
lymph#: 1 10*3/uL (ref 0.9–3.3)
nRBC: 0 % (ref 0–0)

## 2013-02-25 LAB — COMPREHENSIVE METABOLIC PANEL (CC13)
ALK PHOS: 79 U/L (ref 40–150)
ALT: 25 U/L (ref 0–55)
AST: 26 U/L (ref 5–34)
Albumin: 3.8 g/dL (ref 3.5–5.0)
Anion Gap: 10 mEq/L (ref 3–11)
BILIRUBIN TOTAL: 0.34 mg/dL (ref 0.20–1.20)
BUN: 15 mg/dL (ref 7.0–26.0)
CO2: 23 mEq/L (ref 22–29)
Calcium: 10.1 mg/dL (ref 8.4–10.4)
Chloride: 106 mEq/L (ref 98–109)
Creatinine: 1.1 mg/dL (ref 0.6–1.1)
GLUCOSE: 100 mg/dL (ref 70–140)
Potassium: 4.3 mEq/L (ref 3.5–5.1)
Sodium: 139 mEq/L (ref 136–145)
TOTAL PROTEIN: 6.7 g/dL (ref 6.4–8.3)

## 2013-02-25 MED ORDER — FULVESTRANT 250 MG/5ML IM SOLN
500.0000 mg | Freq: Once | INTRAMUSCULAR | Status: AC
  Administered 2013-02-25: 500 mg via INTRAMUSCULAR
  Filled 2013-02-25: qty 10

## 2013-02-25 MED ORDER — ZOLEDRONIC ACID 4 MG/100ML IV SOLN
4.0000 mg | Freq: Once | INTRAVENOUS | Status: AC
  Administered 2013-02-25: 4 mg via INTRAVENOUS
  Filled 2013-02-25: qty 100

## 2013-02-25 NOTE — Patient Instructions (Signed)
Zoledronic Acid injection (Hypercalcemia, Oncology)  What is this medicine? ZOLEDRONIC ACID (ZOE le dron ik AS id) lowers the amount of calcium loss from bone. It is used to treat too much calcium in your blood from cancer. It is also used to prevent complications of cancer that has spread to the bone. This medicine may be used for other purposes; ask your health care provider or pharmacist if you have questions. COMMON BRAND NAME(S): Zometa What should I tell my health care provider before I take this medicine? They need to know if you have any of these conditions: -aspirin-sensitive asthma -cancer, especially if you are receiving medicines used to treat cancer -dental disease or wear dentures -infection -kidney disease -receiving corticosteroids like dexamethasone or prednisone -an unusual or allergic reaction to zoledronic acid, other medicines, foods, dyes, or preservatives -pregnant or trying to get pregnant -breast-feeding How should I use this medicine? This medicine is for infusion into a vein. It is given by a health care professional in a hospital or clinic setting. Talk to your pediatrician regarding the use of this medicine in children. Special care may be needed. Overdosage: If you think you have taken too much of this medicine contact a poison control center or emergency room at once. NOTE: This medicine is only for you. Do not share this medicine with others. What if I miss a dose? It is important not to miss your dose. Call your doctor or health care professional if you are unable to keep an appointment. What may interact with this medicine? -certain antibiotics given by injection -NSAIDs, medicines for pain and inflammation, like ibuprofen or naproxen -some diuretics like bumetanide, furosemide -teriparatide -thalidomide This list may not describe all possible interactions. Give your health care provider a list of all the medicines, herbs, non-prescription drugs, or  dietary supplements you use. Also tell them if you smoke, drink alcohol, or use illegal drugs. Some items may interact with your medicine. What should I watch for while using this medicine? Visit your doctor or health care professional for regular checkups. It may be some time before you see the benefit from this medicine. Do not stop taking your medicine unless your doctor tells you to. Your doctor may order blood tests or other tests to see how you are doing. Women should inform their doctor if they wish to become pregnant or think they might be pregnant. There is a potential for serious side effects to an unborn child. Talk to your health care professional or pharmacist for more information. You should make sure that you get enough calcium and vitamin D while you are taking this medicine. Discuss the foods you eat and the vitamins you take with your health care professional. Some people who take this medicine have severe bone, joint, and/or muscle pain. This medicine may also increase your risk for jaw problems or a broken thigh bone. Tell your doctor right away if you have severe pain in your jaw, bones, joints, or muscles. Tell your doctor if you have any pain that does not go away or that gets worse. Tell your dentist and dental surgeon that you are taking this medicine. You should not have major dental surgery while on this medicine. See your dentist to have a dental exam and fix any dental problems before starting this medicine. Take good care of your teeth while on this medicine. Make sure you see your dentist for regular follow-up appointments. What side effects may I notice from receiving this medicine? Side effects  that you should report to your doctor or health care professional as soon as possible: -allergic reactions like skin rash, itching or hives, swelling of the face, lips, or tongue -anxiety, confusion, or depression -breathing problems -changes in vision -eye pain -feeling faint or  lightheaded, falls -jaw pain, especially after dental work -mouth sores -muscle cramps, stiffness, or weakness -trouble passing urine or change in the amount of urine Side effects that usually do not require medical attention (report to your doctor or health care professional if they continue or are bothersome): -bone, joint, or muscle pain -constipation -diarrhea -fever -hair loss -irritation at site where injected -loss of appetite -nausea, vomiting -stomach upset -trouble sleeping -trouble swallowing -weak or tired This list may not describe all possible side effects. Call your doctor for medical advice about side effects. You may report side effects to FDA at 1-800-FDA-1088. Where should I keep my medicine? This drug is given in a hospital or clinic and will not be stored at home. NOTE: This sheet is a summary. It may not cover all possible information. If you have questions about this medicine, talk to your doctor, pharmacist, or health care provider.  2014, Elsevier/Gold Standard. (2012-06-19 13:03:13)   Fulvestrant injection (Faslodex) What is this medicine? FULVESTRANT (ful VES trant) blocks the effects of estrogen. It is used to treat breast cancer in women past the age of menopause. This medicine may be used for other purposes; ask your health care provider or pharmacist if you have questions. COMMON BRAND NAME(S): FASLODEX What should I tell my health care provider before I take this medicine? They need to know if you have any of these conditions: -bleeding problems -liver disease -low levels of platelets in the blood -an unusual or allergic reaction to fulvestrant, other medicines, foods, dyes, or preservatives -pregnant or trying to get pregnant -breast-feeding How should I use this medicine? This medicine is for injection into a muscle. It is usually given by a health care professional in a hospital or clinic setting. Talk to your pediatrician regarding the use  of this medicine in children. Special care may be needed. Overdosage: If you think you have taken too much of this medicine contact a poison control center or emergency room at once. NOTE: This medicine is only for you. Do not share this medicine with others. What if I miss a dose? It is important not to miss your dose. Call your doctor or health care professional if you are unable to keep an appointment. What may interact with this medicine? -medicines that treat or prevent blood clots like warfarin, enoxaparin, and dalteparin This list may not describe all possible interactions. Give your health care provider a list of all the medicines, herbs, non-prescription drugs, or dietary supplements you use. Also tell them if you smoke, drink alcohol, or use illegal drugs. Some items may interact with your medicine. What should I watch for while using this medicine? Your condition will be monitored carefully while you are receiving this medicine. You will need important blood work done while you are taking this medicine. Do not become pregnant while taking this medicine. Women should inform their doctor if they wish to become pregnant or think they might be pregnant. There is a potential for serious side effects to an unborn child. Talk to your health care professional or pharmacist for more information. What side effects may I notice from receiving this medicine? Side effects that you should report to your doctor or health care professional as soon as  possible: -allergic reactions like skin rash, itching or hives, swelling of the face, lips, or tongue -feeling faint or lightheaded, falls -fever or flu-like symptoms -sore throat -vaginal bleeding Side effects that usually do not require medical attention (report to your doctor or health care professional if they continue or are bothersome): -aches, pains -constipation or diarrhea -headache -hot flashes -nausea, vomiting -pain at site where  injected -stomach pain This list may not describe all possible side effects. Call your doctor for medical advice about side effects. You may report side effects to FDA at 1-800-FDA-1088. Where should I keep my medicine? This drug is given in a hospital or clinic and will not be stored at home. NOTE: This sheet is a summary. It may not cover all possible information. If you have questions about this medicine, talk to your doctor, pharmacist, or health care provider.  2014, Elsevier/Gold Standard. (2007-05-19 15:39:24)

## 2013-02-26 ENCOUNTER — Ambulatory Visit (INDEPENDENT_AMBULATORY_CARE_PROVIDER_SITE_OTHER): Payer: Medicare Other | Admitting: Obstetrics & Gynecology

## 2013-02-26 ENCOUNTER — Encounter: Payer: Self-pay | Admitting: Obstetrics & Gynecology

## 2013-02-26 ENCOUNTER — Ambulatory Visit: Payer: Self-pay | Admitting: Gynecology

## 2013-02-26 VITALS — BP 122/78 | HR 68 | Resp 16 | Ht 63.75 in | Wt 180.4 lb

## 2013-02-26 DIAGNOSIS — Z01419 Encounter for gynecological examination (general) (routine) without abnormal findings: Secondary | ICD-10-CM

## 2013-02-26 DIAGNOSIS — N390 Urinary tract infection, site not specified: Secondary | ICD-10-CM

## 2013-02-26 DIAGNOSIS — Z0000 Encounter for general adult medical examination without abnormal findings: Secondary | ICD-10-CM

## 2013-02-26 DIAGNOSIS — Z124 Encounter for screening for malignant neoplasm of cervix: Secondary | ICD-10-CM

## 2013-02-26 DIAGNOSIS — N870 Mild cervical dysplasia: Secondary | ICD-10-CM

## 2013-02-26 LAB — POCT URINALYSIS DIPSTICK
BILIRUBIN UA: NEGATIVE
Blood, UA: NEGATIVE
Glucose, UA: NEGATIVE
KETONES UA: NEGATIVE
Nitrite, UA: NEGATIVE
Protein, UA: NEGATIVE
Urobilinogen, UA: NEGATIVE
pH, UA: 6.5

## 2013-02-26 LAB — HEMOGLOBIN, FINGERSTICK: HEMOGLOBIN, FINGERSTICK: 13.6 g/dL (ref 12.0–16.0)

## 2013-02-26 NOTE — Patient Instructions (Signed)
EXERCISE AND DIET:  We recommended that you start or continue a regular exercise program for good health. Regular exercise means any activity that makes your heart beat faster and makes you sweat.  We recommend exercising at least 30 minutes per day at least 3 days a week, preferably 4 or 5.  We also recommend a diet low in fat and sugar.  Inactivity, poor dietary choices and obesity can cause diabetes, heart attack, stroke, and kidney damage, among others.    ALCOHOL AND SMOKING:  Women should limit their alcohol intake to no more than 7 drinks/beers/glasses of wine (combined, not each!) per week. Moderation of alcohol intake to this level decreases your risk of breast cancer and liver damage. And of course, no recreational drugs are part of a healthy lifestyle.  And absolutely no smoking or even second hand smoke. Most people know smoking can cause heart and lung diseases, but did you know it also contributes to weakening of your bones? Aging of your skin?  Yellowing of your teeth and nails?  CALCIUM AND VITAMIN D:  Adequate intake of calcium and Vitamin D are recommended.  The recommendations for exact amounts of these supplements seem to change often, but generally speaking 600 mg of calcium (either carbonate or citrate) and 800 units of Vitamin D per day seems prudent. Certain women may benefit from higher intake of Vitamin D.  If you are among these women, your doctor will have told you during your visit.    PAP SMEARS:  Pap smears, to check for cervical cancer or precancers,  have traditionally been done yearly, although recent scientific advances have shown that most women can have pap smears less often.  However, every woman still should have a physical exam from her gynecologist every year. It will include a breast check, inspection of the vulva and vagina to check for abnormal growths or skin changes, a visual exam of the cervix, and then an exam to evaluate the size and shape of the uterus and  ovaries.  And after 61 years of age, a rectal exam is indicated to check for rectal cancers. We will also provide age appropriate advice regarding health maintenance, like when you should have certain vaccines, screening for sexually transmitted diseases, bone density testing, colonoscopy, mammograms, etc.   COLONOSCOPY:  Colonoscopy to screen for colon cancer is recommended for all women at age 61.  We know, you hate the idea of the prep.  We agree, BUT, having colon cancer and not knowing it is worse!!  Colon cancer so often starts as a polyp that can be seen and removed at colonscopy, which can quite literally save your life!  And if your first colonoscopy is normal and you have no family history of colon cancer, most women don't have to have it again for 10 years.  Once every ten years, you can do something that may end up saving your life, right?  We will be happy to help you get it scheduled when you are ready.  Be sure to check your insurance coverage so you understand how much it will cost.  It may be covered as a preventative service at no cost, but you should check your particular policy.      

## 2013-02-26 NOTE — Progress Notes (Signed)
63 y.o. G3P1001 MarriedCaucasianF here for annual exam.  Just had a bone biopsy with metastatic disease.  Second opinion at [facility] for salvage therapy.  Seeing a specialist, Dr. Benjie Karvonen, At [facility].  PET scan scheduled  03/23/13.  Astroglide and Replens has fixed the discharge and dyspareunia.     Had done radiation to right and left hip and 7th rib.    No vaginal bleeding.  Just treated for UTI, again, about six weeks ago.  A little discomfort today.  Had 5 UTIs last year.   Patient's last menstrual period was 01/23/2007.          Sexually active: yes  The current method of family planning is tubal ligation.    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 Oletta Lamas BMD:   7/14 (-0.8/-1.4) TDaP:  2013 Screening Labs: oncology, Hb today: 13.6, Urine today: wbc trace, pH 6.5   reports that she quit smoking about 29 years ago. She has never used smokeless tobacco. She reports that she drinks about 1.5 ounces of alcohol per week. She reports that she does not use illicit drugs.  Past Medical History  Diagnosis Date  . Hx Breast Cancer, IDC, Stahe III, receptor + 10/20/2010    left breast  . Breast cancer December 2010    invasive ductal and invasive lobular; bilateral mastectomy and radiation  . Mitral valve prolapse   . History of recurrent UTIs   . Radiation 07/26/09-09/08/09    left breast  . Fracture, pelvis closed 11/2012    both hipsand number 7 rib, 15 rounds radiation    Past Surgical History  Procedure Laterality Date  . Mastectomy Bilateral 2011    left breast cancer  . Lasik    . Reduction mammaplasty Bilateral 1994  . Tonsillectomy and adenoidectomy    . Aspiration of left breast Left 2004    [facility]/Dr.Teo  .  Bone biopsy  02/19/13    mets from breast cancer-receptors results not back    Current Outpatient Prescriptions  Medication Sig Dispense Refill  . ALPRAZolam (XANAX XR) 1 MG 24 hr tablet Take 1 mg by mouth 2 (two) times daily as needed for anxiety.       . Calcium Citrate (CITRACAL PO) Take 3 tablets by mouth daily.      . cholecalciferol (VITAMIN D) 400 UNITS TABS tablet Take 400 Units by mouth daily. Patient reportd the Vitamin E should be vitamin D with same dose      . diphenhydrAMINE (BENADRYL) 25 mg capsule Take 25 mg by mouth at bedtime as needed for sleep.       . fulvestrant (FASLODEX) 250 MG/5ML injection Inject 250 mg into the muscle every 30 (thirty) days. One injection each buttock over 1-2 minutes. Warm prior to use.      . gabapentin (NEURONTIN) 300 MG capsule Take 300 mg by mouth at bedtime.       . naproxen (NAPROSYN) 500 MG tablet Take 500 mg by mouth 2 (two) times daily as needed for moderate pain.       Vladimir Faster Glycol-Propyl Glycol (SYSTANE) 0.4-0.3 % SOLN Apply 1 drop to eye 2 (two) times daily as needed (dry eyes).      . Vaginal  Lubricant (REPLENS VA) Place vaginally. Combining with estroglide      . venlafaxine XR (EFFEXOR-XR) 37.5 MG 24 hr capsule Take 75 mg by mouth daily with breakfast.       . Zoledronic Acid (ZOMETA IV) Inject into the vein every 30 (thirty) days.       No current facility-administered medications for this visit.    Family History  Problem Relation Age of Onset  . Lung cancer Father 50    smoker for 25 years  . Heart disease Maternal Grandmother   . Heart disease Maternal Grandfather   . Diabetes type I Mother   . Diabetes type I Sister   . Diabetes type I Sister   . Heart disease Maternal Uncle   . Uterine cancer Paternal Grandmother 7  . Breast cancer Other 14    paternal great grandmother    ROS:  Pertinent items are noted in HPI.  Otherwise, a comprehensive ROS was negative.  Exam:   BP 122/78  Pulse 68  Resp 16  Ht 5'  3.75" (1.619 m)  Wt 180 lb 6.4 oz (81.829 kg)  BMI 31.22 kg/m2  LMP 01/23/2007  Weight change: -1#   Height: 5' 3.75" (161.9 cm)  Ht Readings from Last 3 Encounters:  02/26/13 5' 3.75" (1.619 m)  02/19/13 5\' 4"  (1.626 m)  02/11/13 5\' 4"  (1.626 m)    General appearance: alert, cooperative and appears stated age Head: Normocephalic, without obvious abnormality, atraumatic Neck: no adenopathy, supple, symmetrical, trachea midline and thyroid normal to inspection and palpation Lungs: clear to auscultation bilaterally Breasts: absent breasts bilaterally Heart: regular rate and rhythm Abdomen: soft, non-tender; bowel sounds normal; no masses,  no organomegaly Extremities: extremities normal, atraumatic, no cyanosis or edema Skin: Skin color, texture, turgor normal. No rashes or lesions Lymph nodes: Cervical, supraclavicular, and axillary nodes normal. No abnormal inguinal nodes palpated Neurologic: Grossly normal   Pelvic: External genitalia:  no lesions              Urethra:  normal appearing urethra with no masses, tenderness or lesions              Bartholins and Skenes: normal                 Vagina: normal appearing vagina with normal color and discharge, no lesions              Cervix: no lesions              Pap taken: yes Bimanual Exam:  Uterus:  normal size, contour, position, consistency, mobility, non-tender              Adnexa: normal adnexa and no mass, fullness, tenderness               Rectovaginal: Confirms               Anus:  normal sphincter tone, no lesions  A:  Well Woman with normal exam Stage 4 breast cancer with + bone mets, seeing oncologist at [facility] H/O +HR HPV, 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  Will start suppression if negative. return annually or prn  An After Visit Summary was printed and given to the patient.

## 2013-02-27 ENCOUNTER — Other Ambulatory Visit: Payer: Medicare Other | Admitting: Lab

## 2013-02-27 LAB — URINE CULTURE
Colony Count: NO GROWTH
ORGANISM ID, BACTERIA: NO GROWTH

## 2013-03-02 LAB — IPS PAP TEST WITH HPV

## 2013-03-06 ENCOUNTER — Other Ambulatory Visit: Payer: Self-pay | Admitting: *Deleted

## 2013-03-06 ENCOUNTER — Other Ambulatory Visit: Payer: Self-pay | Admitting: Oncology

## 2013-03-06 ENCOUNTER — Telehealth: Payer: Self-pay | Admitting: *Deleted

## 2013-03-06 DIAGNOSIS — R87810 Cervical high risk human papillomavirus (HPV) DNA test positive: Secondary | ICD-10-CM

## 2013-03-06 NOTE — Telephone Encounter (Signed)
Patient returned call. Discussed pap results and Dr Ammie Ferrier recommendation for colpo. Procedure reviewed and instructions given.  Patient on Naproxen so she will take this one hour prior with food.  Aware insurance dept will check benefits. Notified of cancel policy. Colpo 03-16-13. Encounter closed.

## 2013-03-06 NOTE — Telephone Encounter (Signed)
Call to patient, LMTCB

## 2013-03-06 NOTE — Telephone Encounter (Signed)
This RN spoke with pt per her call regarding results of bone biopsy.  Per conversation this RN informed pt MD not available due to out of the office.  Results discussed per reading from 1/29.  Pt has several concerns including " need for additional therapy or change in therapy"  This RN also discussed with pt communication with [employer].  She states per their studies results may take a month for completion.  Plan per phone call is pt's appt rescheduled to last week in Feb due to MD return to office./ This RN will fax report to Dr Benjie Karvonen at [facility] at fax number [phone].

## 2013-03-06 NOTE — Telephone Encounter (Signed)
Message copied by Jaymes Graff on Fri Mar 06, 2013 12:29 PM ------      Message from: Megan Salon      Created: Thu Mar 05, 2013  6:02 AM       Inform pap nl but HR HPV +.  Was + last year.  Needs colpo.  DId this last year.  Pt wants hysterectomy.  Will discuss at colpo.  Has stage 4 breast cancer.  Pt also had negative urine culture.  Please let her know I want to start antibiotic suppression for this and we will plan when comes for colpo.            Cc:  Maryann Conners ------

## 2013-03-09 ENCOUNTER — Other Ambulatory Visit: Payer: Self-pay | Admitting: *Deleted

## 2013-03-09 ENCOUNTER — Telehealth: Payer: Self-pay | Admitting: Obstetrics and Gynecology

## 2013-03-09 NOTE — Telephone Encounter (Signed)
Patient is scheduled with Dr. Collene Mares 02.17.2015 @0900 .

## 2013-03-16 ENCOUNTER — Ambulatory Visit (INDEPENDENT_AMBULATORY_CARE_PROVIDER_SITE_OTHER): Payer: Medicare Other | Admitting: Obstetrics & Gynecology

## 2013-03-16 ENCOUNTER — Other Ambulatory Visit: Payer: Self-pay | Admitting: Oncology

## 2013-03-16 VITALS — BP 122/64 | HR 78 | Resp 16 | Ht 63.75 in | Wt 184.2 lb

## 2013-03-16 DIAGNOSIS — R87810 Cervical high risk human papillomavirus (HPV) DNA test positive: Secondary | ICD-10-CM

## 2013-03-16 DIAGNOSIS — N870 Mild cervical dysplasia: Secondary | ICD-10-CM

## 2013-03-17 NOTE — Progress Notes (Signed)
63 y.o. Married  Caucasian  female G3P1 here for colposcopy due to normal pap with +HRHPV.  This was same result has been present now since 2013.  Colposcopy last year 2/14 with neg ECC but cervical biopsy with CIN1.  Pt accompanied by spouse today.  She continues to desire hysterectomy although I think this is a lot of surgery for what might be cured with a LEEP.  I cannot promise that this will fully treat the HR HPV but in light of metastatic breast cancer, I feel a hysterectomy at this time is not very beneficial to her except for peace of mind.  She has an appt with Dr. Jana Hakim later this week and will be discussing plans for future treatment of breast cancer.  She plans to discuss this with him as well.  Patient's last menstrual period was 01/23/2007.  Contraception:  PMP   Blood pressure 122/64, pulse 78, resp. rate 16, height 5' 3.75" (1.619 m), weight 184 lb 3.2 oz (83.553 kg), last menstrual period 01/23/2007.  Procedure explained and patient's questions were invited and answered.  Consent form signed.    Speculum inserted atraumatically and cervix visualized.  3% acetic acid applied.  Cervix examined using 3.75 and 7.5   X magnification and green filter.  No AWE noted.  Cervix stained with Lugol's solution without abnormal staining noted.  Biopsies at 3 and 9 o'clock obtained.  ECC obtained.  Monsel's solution used for excellent hemostasis.    Patient tolerated procedure well.   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  Also, may proceed with LEEP (as this is appropriate by the guidelines as well) even if there is no dysplasia on biopsies due to persistence of +HR HPV.  They are both  comfortable with this plan right now but may discuss with Dr. Jana Hakim later this week.    ~20 minutes spent with patient discussing possible treatment plans in addition to procedure performed today.

## 2013-03-18 ENCOUNTER — Other Ambulatory Visit (HOSPITAL_BASED_OUTPATIENT_CLINIC_OR_DEPARTMENT_OTHER): Payer: Medicare Other

## 2013-03-18 ENCOUNTER — Ambulatory Visit (HOSPITAL_BASED_OUTPATIENT_CLINIC_OR_DEPARTMENT_OTHER): Payer: Medicare Other | Admitting: Oncology

## 2013-03-18 ENCOUNTER — Telehealth: Payer: Self-pay | Admitting: Oncology

## 2013-03-18 ENCOUNTER — Other Ambulatory Visit: Payer: Self-pay | Admitting: Oncology

## 2013-03-18 VITALS — BP 139/80 | HR 90 | Temp 97.6°F | Resp 18 | Ht 63.75 in | Wt 185.0 lb

## 2013-03-18 DIAGNOSIS — C773 Secondary and unspecified malignant neoplasm of axilla and upper limb lymph nodes: Secondary | ICD-10-CM

## 2013-03-18 DIAGNOSIS — C50919 Malignant neoplasm of unspecified site of unspecified female breast: Secondary | ICD-10-CM

## 2013-03-18 DIAGNOSIS — Z171 Estrogen receptor negative status [ER-]: Secondary | ICD-10-CM

## 2013-03-18 DIAGNOSIS — C50512 Malignant neoplasm of lower-outer quadrant of left female breast: Secondary | ICD-10-CM

## 2013-03-18 DIAGNOSIS — C7951 Secondary malignant neoplasm of bone: Secondary | ICD-10-CM

## 2013-03-18 DIAGNOSIS — L539 Erythematous condition, unspecified: Secondary | ICD-10-CM

## 2013-03-18 DIAGNOSIS — C7952 Secondary malignant neoplasm of bone marrow: Secondary | ICD-10-CM

## 2013-03-18 LAB — COMPREHENSIVE METABOLIC PANEL (CC13)
ALT: 34 U/L (ref 0–55)
ANION GAP: 7 meq/L (ref 3–11)
AST: 38 U/L — AB (ref 5–34)
Albumin: 3.7 g/dL (ref 3.5–5.0)
Alkaline Phosphatase: 89 U/L (ref 40–150)
BUN: 15.8 mg/dL (ref 7.0–26.0)
CALCIUM: 9.7 mg/dL (ref 8.4–10.4)
CO2: 26 meq/L (ref 22–29)
CREATININE: 0.9 mg/dL (ref 0.6–1.1)
Chloride: 108 mEq/L (ref 98–109)
Glucose: 76 mg/dl (ref 70–140)
POTASSIUM: 4.6 meq/L (ref 3.5–5.1)
Sodium: 141 mEq/L (ref 136–145)
Total Bilirubin: 0.29 mg/dL (ref 0.20–1.20)
Total Protein: 6.6 g/dL (ref 6.4–8.3)

## 2013-03-18 LAB — CBC WITH DIFFERENTIAL/PLATELET
BASO%: 0.6 % (ref 0.0–2.0)
BASOS ABS: 0 10*3/uL (ref 0.0–0.1)
EOS%: 5 % (ref 0.0–7.0)
Eosinophils Absolute: 0.4 10*3/uL (ref 0.0–0.5)
HCT: 36.4 % (ref 34.8–46.6)
HEMOGLOBIN: 12.3 g/dL (ref 11.6–15.9)
LYMPH#: 1.2 10*3/uL (ref 0.9–3.3)
LYMPH%: 17.3 % (ref 14.0–49.7)
MCH: 29.8 pg (ref 25.1–34.0)
MCHC: 33.7 g/dL (ref 31.5–36.0)
MCV: 88.5 fL (ref 79.5–101.0)
MONO#: 0.9 10*3/uL (ref 0.1–0.9)
MONO%: 12.1 % (ref 0.0–14.0)
NEUT#: 4.6 10*3/uL (ref 1.5–6.5)
NEUT%: 65 % (ref 38.4–76.8)
Platelets: 207 10*3/uL (ref 145–400)
RBC: 4.11 10*6/uL (ref 3.70–5.45)
RDW: 12.6 % (ref 11.2–14.5)
WBC: 7.1 10*3/uL (ref 3.9–10.3)

## 2013-03-18 LAB — IPS CERVICAL/ECC/EMB/VULVAR/VAGINAL BIOPSY

## 2013-03-18 LAB — CANCER ANTIGEN 15-3: CANCER ANTIGEN-BREAST 15-3: 161 U/mL — AB (ref <32)

## 2013-03-18 LAB — CEA: CEA: 51.9 ng/mL — ABNORMAL HIGH (ref 0.0–5.0)

## 2013-03-18 MED ORDER — KETOCONAZOLE 2 % EX CREA: 1.0000 "application " | TOPICAL_CREAM | Freq: Every day | CUTANEOUS | Status: DC

## 2013-03-18 NOTE — Telephone Encounter (Signed)
, °

## 2013-03-18 NOTE — Progress Notes (Signed)
ID: Brooke [name]   DOB: 10-Feb-1950  MR#: [id number]  OIZ#:[id number]  PCP: Irven Shelling, MD GYN: SU:  OTHER MD: Thea Silversmith, Gaynelle Arabian, [name] [phone])  CHIEF COMPLAINT:  "We had a good visit at [facility]".  HISTORY OF PRESENT ILLNESS: Brooke [name] had screening mammography in August 04, 2007 which showed no specific mammographic evidence of malignancy, but she reported a left breast lump.  Accordingly, she was brought back on August 11, 2007 for mammography and ultrasonography.  There was indeed an obscured mass in the upper left breast which by ultrasound appeared to be a simple cyst.    Screening mammogram on August 04, 2008 showed in addition to dense breasts, again a possible mass in the left breast.  She had diagnostic mammography August 06, 2008 and additional views in addition to very dense breasts did not show any persistent mass or distortion.  Furthermore, Dr. Owens Shark was not able to palpate any abnormality in the lateral portion of the left breast.  Ultrasound showed normal appearing fibroglandular tissue in the area in question.    More recently, about September, the patient felt again something like a lump in the left breast and she had some pain associated with this.  This was initially felt simply to be mastalgia and was treated as such, but as it persisted, on December 23rd, the patient had left diagnostic mammography and ultrasonography.  There was a focally dense area in the left breast with very minimal distortion corresponding to the area of palpable abnormality. The ultrasound showed an irregular ill-defined, hypoechoic area with distal shadowing, measuring approximately 1 cm corresponding to the patient's palpable abnormality.    With this information, the patient was brought back for ultrasound-guided core biopsy and this was performed December 28th.  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  There was no evidence of angiolymphatic invasion in the material submitted which measured 1.5 cm maximally.  In addition to the mass in the breast, a lymph node in the left axilla was biopsied and was likewise positive.  The tumor was ER positive at 89%, PR positive at 34% with an elevated MIB-1 at 44% and Her-2 negative with a ratio of 1.13.    Her subsequent history is as detailed below.  INTERVAL HISTORY: Brooke [name] returns today with her husband Brooke [name] for followup of her metastatic breast cancer. Since her last visit here she spent the weekend in [place] which she enjoyed greatly. She is tolerating the fulvestrant with only minimal discomfort. There have been no side effects from the zolendronate. Recently she also was evaluated by Dr. Sabra Heck in gynecology, with HPV and cytology pending  REVIEW OF SYSTEMS: Brooke [name] has not had a red raised area under her left chest wall scar that she wanted me to look at today. The biggest problem she is having however is continuing pain in the right hip. This actually doesn't bother her so much when she is walking. It hurts when she is lying down and she has to get up by turning onto her side. This is constant. She takes Naprosyn 3 times a day for this. Otherwise a detailed review of systems today was entirely noncontributory  PAST MEDICAL HISTORY: Past Medical History  Diagnosis Date  . Hx Breast Cancer, IDC, Stahe III, receptor + 10/20/2010    left breast  . Breast cancer December 2010    invasive ductal and invasive lobular; bilateral mastectomy and radiation  . Mitral  valve prolapse   . History of recurrent UTIs   . Radiation 07/26/09-09/08/09    left breast  . Fracture, pelvis closed 11/2012    both hipsand number 7 rib, 15 rounds radiation  1. Remote migraines, currently inactive.   2. History of prior diagnosis of fibromyalgia made at [facility] and currently not active (she was treated with trimethoprim and nortriptyline for one  year for this).    3. History of recurrent UTIs.   4. History of benign left breast cyst aspirated in 2004 at [facility] under Dr. Melene Plan.   5. History of tonsillectomy and adenoidectomy.   6. History of right LASEK surgery. 7. History of bilateral reduction mammoplasties in 1994.   8. History of mitral valve prolapse diagnosed in 1984.  PAST SURGICAL HISTORY: Past Surgical History  Procedure Laterality Date  . Mastectomy Bilateral 2011    left breast cancer  . Lasik    . Reduction mammaplasty Bilateral 1994  . Tonsillectomy and adenoidectomy    . Aspiration of left breast Left 2004    [facility]Dr.Teo  . Bone biopsy  02/19/13    mets from breast cancer-receptors results not back    FAMILY HISTORY Family History  Problem Relation Age of Onset  . Lung cancer Father 49    smoker for 25 years  . Heart disease Maternal Grandmother   . Heart disease Maternal Grandfather   . Diabetes type I Mother   . Diabetes type I Sister   . Diabetes type I Sister   . Heart disease Maternal Uncle   . Uterine cancer Paternal Grandmother 71  . Breast cancer Other 16    paternal great grandmother  [name] died at the age of 33 from lung cancer.  He had been a remote smoker and had been in [employer] with a question of possible asbestos exposure.  [name] died at the age of 69 in the setting of Alzheimer's, coronary artery disease, diabetes and hypertension.  The patient had two sisters.  One died from complications of diabetes.  The other one is alive with diabetes.    GYNECOLOGIC HISTORY: She is GX P1.  First pregnancy to term at age 89.  The patient's last period was in 2008.  She took hormone replacement for about 18 months until mid-2008.  She had no complications from that treatment.    SOCIAL HISTORY: (Updated October 2014) She used to be a Arts development officer for [employer]  They used to live in the [place] area. 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  Her husband, Brooke [name], retired October 2012.  He was a Arts development officer for 28 years.  Their son, [name], is currently with the [employer] at the [employer] in [place]. [name].  She attends [employer].   ADVANCED DIRECTIVES: in place  HEALTH MAINTENANCE: (Updated 11/19/2012) History  Substance Use Topics  . Smoking status: Former Smoker -- 1.00 packs/day for 11 years    Quit date: 01/23/1984  . Smokeless tobacco: Never Used  . Alcohol Use: 1.5 - 2.5 oz/week    3-5 drink(s) per week     Comment: glasses of wine     Colonoscopy: 2010  PAP: Jan 2014  Bone density: July 2014, osteopenia  Lipid panel: per Dr Valentina Lucks  Allergies  Allergen Reactions  . Sulfa Antibiotics Shortness Of Breath  . Codeine Nausea  And Vomiting    Tolerates oxycodone if given with prochlorperazine for nausea    Current Outpatient Prescriptions  Medication Sig Dispense Refill  . ALPRAZolam (XANAX XR) 1 MG 24 hr tablet Take 1 mg by mouth 2 (two) times daily as needed for anxiety.       . Calcium Citrate (CITRACAL PO) Take 3 tablets by mouth daily.      . cholecalciferol (VITAMIN D) 400 UNITS TABS tablet Take 400 Units by mouth daily. Patient reportd the Vitamin E should be vitamin D with same dose      . diphenhydrAMINE (BENADRYL) 25 mg capsule Take 25 mg by mouth at bedtime as needed for sleep.       . fulvestrant (FASLODEX) 250 MG/5ML injection Inject 250 mg into the muscle every 30 (thirty) days. One injection each buttock over 1-2 minutes. Warm prior to use.      . gabapentin (NEURONTIN) 300 MG capsule Take 300 mg by mouth at bedtime.       . naproxen (NAPROSYN) 500 MG tablet Take 500 mg by mouth 2 (two) times daily as needed for moderate pain.       Vladimir Faster Glycol-Propyl Glycol (SYSTANE) 0.4-0.3 % SOLN Apply 1 drop to eye 2 (two) times daily as needed (dry eyes).      . Vaginal Lubricant (REPLENS VA)  Place vaginally. Combining with estroglide      . venlafaxine XR (EFFEXOR-XR) 37.5 MG 24 hr capsule TAKE 2 CAPSULES DAILY  180 capsule  1  . Zoledronic Acid (ZOMETA IV) Inject into the vein every 30 (thirty) days.       No current facility-administered medications for this visit.     Filed Vitals:   03/18/13 0816  BP: 139/80  Pulse: 90  Temp: 97.6 F (36.4 C)  Resp: 18     Body mass index is 32.01 kg/(m^2).    ECOG FS: 1 Filed Weights   03/18/13 0816  Weight: 185 lb (83.915 kg)   Objective: Middle-aged white woman in no acute distress  Sclerae unicteric, pupils equal and reactive Oropharynx clear and moist-- no thrush or other lesions No cervical or supraclavicular adenopathy Lungs no rales or rhonchi Heart regular rate and rhythm Abd soft, nontender, positive bowel sounds, no masses palpated MSK no focal spinal tenderness; no upper extremity lymphedema; she is able to straight leg raise bilaterally to 85 without difficulty Neuro: Hip flexion and dorsiflexion bilaterally are 5 over 5; exam is otherwise nonfocal. She is well oriented, with positive affect Breasts: Status post bilateral mastectomies. On the left side, in addition to some: Dictation is, beneath the scar there is an area of approximately 4 x 3 mm of palpable erythema. The center however is not involved, suggesting to me more a candidal rash than local recurrence from her cancer   LAB RESULTS: Lab Results  Component Value Date   WBC 3.7* 02/25/2013   NEUTROABS 2.3 02/25/2013   HGB 12.9 02/25/2013   HCT 38.4 02/25/2013   MCV 88.5 02/25/2013   PLT 174 02/25/2013      Chemistry      Component Value Date/Time   NA 139 02/25/2013 1347   NA 142 06/26/2011 0728   NA 143 12/12/2010 1325   K 4.3 02/25/2013 1347   K 4.6 06/26/2011 0728   K 4.7 12/12/2010 1325   CL 105 01/07/2012 1347   CL 102 06/26/2011 0728   CL 105 12/12/2010 1325   CO2 23 02/25/2013 1347   CO2 30  06/26/2011 0728   CO2 27 12/12/2010 1325   BUN 15.0 02/25/2013 1347    BUN 12 06/26/2011 0728   BUN 14 12/12/2010 1325   CREATININE 1.1 02/25/2013 1347   CREATININE 1.1 06/26/2011 0728   CREATININE 0.96 12/12/2010 1325      Component Value Date/Time   CALCIUM 10.1 02/25/2013 1347   CALCIUM 9.1 06/26/2011 0728   CALCIUM 10.1 12/12/2010 1325   ALKPHOS 79 02/25/2013 1347   ALKPHOS 54 12/12/2010 1325   AST 26 02/25/2013 1347   AST 19 12/12/2010 1325   ALT 25 02/25/2013 1347   ALT 19 12/12/2010 1325   BILITOT 0.34 02/25/2013 1347   BILITOT 0.3 12/12/2010 1325       Lab Results  Component Value Date   LABCA2 21 01/07/2012    STUDIES:  Patient: Brooke [name], Brooke [name] Collected: 02/19/2013 Client: [facility] Accession: [id number] Received: 02/19/2013 D. Arne Cleveland DOB: [birth year] Age: 81 Gender: F Reported: 02/20/2013 [address] Patient Ph: [phone] MRN #: [id number] [place], [place] [address] Visit #: [id number] Chart #: Phone: [phone] Fax: CC: Lurline Del REPORT OF SURGICAL PATHOLOGY ADDITIONAL INFORMATION: PROGNOSTIC INDICATORS - ACIS Results: IMMUNOHISTOCHEMICAL AND MORPHOMETRIC ANALYSIS BY THE AUTOMATED CELLULAR IMAGING SYSTEM (ACIS) Estrogen Receptor: 0%, NEGATIVE Progesterone Receptor: 0%, NEGATIVE COMMENT: The negative hormone receptor study(ies) in this case have no internal positive control. REFERENCE RANGE ESTROGEN RECEPTOR NEGATIVE <1% POSITIVE =>1% PROGESTERONE RECEPTOR NEGATIVE <1% POSITIVE =>1% All controls stained appropriately Claudette Laws MD Pathologist, Electronic Signature ( Signed 02/25/2013) FINAL DIAGNOSIS Diagnosis Bone, biopsy, right iliac - METASTATIC CARCINOMA. - SEE COMMENT. 1 of  ASSESSMENT: 63 y.o. [place] woman   (1)  status post Left breast and Left axillary lymph node biopsy Dec 2010 for a cT3 pN1 (stage IIIA) invasive ductal carcinoma, grade 2, estrogen and progesterone receptor positive, HER-2 negative, with an MIB-1 of 44%;   (2)  treated neoadjuvantly with dose dense  cyclophosphamide and doxorubicin x4 followed by weekly paclitaxel x7  (3) s/p bilateral mastectomies May 2011. She had a residual 1.5 cm tumor, and one of 8 sampled lymph nodes was positive [ypT1c ypN1]. Margins were negative.   (4)  She completed radiation in August of 2011   (5) on letrozole between August 2011 and October 2014  (6) PET scan 11/05/2012 obtained to evaluate right chest wall pain documents bony metastatic disease to the right seventh rib, L1, and the right acetabulum with an associated pathologic fracture through the anterior cortex of the acetabulum measuring 3.2 cm. There was no evidence of extra osseous metastatic disease.  (7)  as of 11/05/2012, receiving fulvestrant injections, 500 mg monthly, in addition to monthly infusions of zoledronic acid.  (8) Right rib, left hip and right hip were all treated to 37.5 Gy in 15 fractions at 2.5 Gy per fraction completed 12/17/2012  (9) 02/19/2013 bone marrow biopsy confirms metastatic disease to bone. HER-2 was not done because of concerns regarding because of fine the specimen. Estrogen and progesterone receptors were read as negative. This is discordant.   PLAN:  We spent over 45 minutes today discussing that situation. We have to be skeptical of the estrogen and progesterone results from this bone biopsy. I think the best thing would be for Dr. Genia Harold at [facility] to repeat a prognostic panel with this sample they have they are, if possible. They can also do a HER-2, since that sample was not decalcified.  I think the area of palpable erythema under the scar in the left  chest wall is going to be a candidal infection and I am starting at on an antifungal cream. We will review this again when she returns to see me next week after her PET scan. If it has not significantly improved we will do a skin biopsy.  She walks fine but has significant pain in the right hip, which is persistent. This area was a ready irradiated. There is a prior  MRI of which Dr. Wynelle Link obtained. I am going to repeat an MRI and compare it to see if there is a concern regarding a possible impending fracture or other actionable finding.  Otherwise Brooke [name] is tolerating the fulvestrant and zolendronate well. She is scheduled for a PET scan March 2 and to see me again in March 4. If we document progression Dr. Genia Harold has recommended capecitabine and today we talked about the possible toxicities, side effects and complications of that agent. In any case we will continue the zolendronate.  Brooke [name] is a good understanding of this plan. She agrees with that. She knows to call for any problems that may develop before next visit here.  Chauncey Cruel, MD 03/18/2013

## 2013-03-20 ENCOUNTER — Telehealth: Payer: Self-pay | Admitting: Obstetrics & Gynecology

## 2013-03-20 ENCOUNTER — Other Ambulatory Visit: Payer: Self-pay | Admitting: Oncology

## 2013-03-20 DIAGNOSIS — IMO0002 Reserved for concepts with insufficient information to code with codable children: Secondary | ICD-10-CM

## 2013-03-20 NOTE — Telephone Encounter (Signed)
Patient is calling for results. Patient says she was told someone would call her today.

## 2013-03-20 NOTE — Telephone Encounter (Signed)
Return call to patient.  Advised per Dr Sabra Heck instructions that path report reveals LGSIL. With this result, recommend LEEP.  Patient is agreeable, she reports Dr Sabra Heck has already discussed this procedure in detail with her. She requests that Dr Sabra Heck relay this information to Dr Jana Hakim and to Dr Genia Harold in [place].   Wants Dr Sabra Heck to know she has PET scan and MRI of right hip on Monday 03-23-13 followed by consult with Dr Jana Hakim on Wed 03-25-13. Can we coordinate relaying results and plan for LEEP to him before her appointment. Scheduled LEEP for 03-30-13 at 9am, she will take Naproxen (takes this instead of Motrin) one hour prior with food.   Requests that path report be sent to Dr Vernice Jefferson fax [phone] Patient ID# [id number].  Anything else?

## 2013-03-20 NOTE — Telephone Encounter (Signed)
Pap and colpo results printed and to your office to review/sign before faxed.

## 2013-03-20 NOTE — Progress Notes (Unsigned)
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 Dr. Sabra Heck is planning a LEEP procedure. The patient is interested in hysterectomy (which is "more than is necessary").-- Will discuss with patient at the next visit.

## 2013-03-21 ENCOUNTER — Other Ambulatory Visit: Payer: Self-pay | Admitting: Oncology

## 2013-03-23 ENCOUNTER — Encounter (HOSPITAL_COMMUNITY)
Admission: RE | Admit: 2013-03-23 | Discharge: 2013-03-23 | Disposition: A | Discharge status: Home / Self Care | Payer: Medicare Other | Source: Ambulatory Visit | Attending: Oncology | Admitting: Oncology

## 2013-03-23 ENCOUNTER — Encounter (HOSPITAL_COMMUNITY): Payer: Self-pay

## 2013-03-23 ENCOUNTER — Ambulatory Visit
Admission: RE | Admit: 2013-03-23 | Discharge: 2013-03-23 | Disposition: A | Discharge status: Home / Self Care | Payer: Medicare Other | Source: Ambulatory Visit | Attending: Oncology | Admitting: Oncology

## 2013-03-23 DIAGNOSIS — Z79899 Other long term (current) drug therapy: Secondary | ICD-10-CM | POA: Insufficient documentation

## 2013-03-23 DIAGNOSIS — C7951 Secondary malignant neoplasm of bone: Secondary | ICD-10-CM | POA: Insufficient documentation

## 2013-03-23 DIAGNOSIS — C7952 Secondary malignant neoplasm of bone marrow: Secondary | ICD-10-CM

## 2013-03-23 DIAGNOSIS — C50512 Malignant neoplasm of lower-outer quadrant of left female breast: Secondary | ICD-10-CM

## 2013-03-23 DIAGNOSIS — C50919 Malignant neoplasm of unspecified site of unspecified female breast: Secondary | ICD-10-CM | POA: Insufficient documentation

## 2013-03-23 DIAGNOSIS — I059 Rheumatic mitral valve disease, unspecified: Secondary | ICD-10-CM | POA: Insufficient documentation

## 2013-03-23 LAB — GLUCOSE, CAPILLARY: Glucose-Capillary: 98 mg/dL (ref 70–99)

## 2013-03-23 MED ORDER — FLUDEOXYGLUCOSE F - 18 (FDG) INJECTION
9.6000 | Freq: Once | INTRAVENOUS | Status: AC | PRN
  Administered 2013-03-23: 9.6 via INTRAVENOUS

## 2013-03-23 MED ORDER — GADOBENATE DIMEGLUMINE 529 MG/ML IV SOLN
15.0000 mL | Freq: Once | INTRAVENOUS | Status: AC | PRN
  Administered 2013-03-23: 15 mL via INTRAVENOUS

## 2013-03-25 ENCOUNTER — Ambulatory Visit: Payer: Medicare Other | Admitting: Oncology

## 2013-03-25 ENCOUNTER — Ambulatory Visit (HOSPITAL_BASED_OUTPATIENT_CLINIC_OR_DEPARTMENT_OTHER): Payer: Medicare Other | Admitting: Oncology

## 2013-03-25 VITALS — BP 126/79 | HR 102 | Temp 97.6°F | Resp 18 | Ht 63.75 in | Wt 185.7 lb

## 2013-03-25 DIAGNOSIS — C50519 Malignant neoplasm of lower-outer quadrant of unspecified female breast: Secondary | ICD-10-CM

## 2013-03-25 DIAGNOSIS — M25559 Pain in unspecified hip: Secondary | ICD-10-CM

## 2013-03-25 DIAGNOSIS — C50919 Malignant neoplasm of unspecified site of unspecified female breast: Secondary | ICD-10-CM

## 2013-03-25 DIAGNOSIS — M25551 Pain in right hip: Secondary | ICD-10-CM

## 2013-03-25 DIAGNOSIS — C7952 Secondary malignant neoplasm of bone marrow: Secondary | ICD-10-CM

## 2013-03-25 DIAGNOSIS — C7951 Secondary malignant neoplasm of bone: Secondary | ICD-10-CM

## 2013-03-25 DIAGNOSIS — C50512 Malignant neoplasm of lower-outer quadrant of left female breast: Secondary | ICD-10-CM

## 2013-03-25 MED ORDER — MORPHINE SULFATE ER 15 MG PO TBCR: 15.0000 mg | EXTENDED_RELEASE_TABLET | Freq: Two times a day (BID) | ORAL | Status: DC

## 2013-03-25 MED ORDER — MORPHINE SULFATE 15 MG PO TABS: 15.0000 mg | ORAL_TABLET | ORAL | Status: DC | PRN

## 2013-03-25 NOTE — Progress Notes (Signed)
ID: [name]   DOB: May 03, 1950  MR#: [id number]  LNL#:[id number]  PCP: Irven Shelling, MD GYN: SU:  OTHER MD: Thea Silversmith, Gaynelle Arabian, [name] [phone])  CHIEF COMPLAINT:  "We had a good visit at [facility]".  BREAST CANCER HISTORY: Brooke [name] had screening mammography in August 04, 2007 which showed no specific mammographic evidence of malignancy, but she reported a left breast lump.  Accordingly, she was brought back on August 11, 2007 for mammography and ultrasonography.  There was indeed an obscured mass in the upper left breast which by ultrasound appeared to be a simple cyst.    Screening mammogram on August 04, 2008 showed in addition to dense breasts, again a possible mass in the left breast.  She had diagnostic mammography August 06, 2008 and additional views in addition to very dense breasts did not show any persistent mass or distortion.  Furthermore, Dr. Owens Shark was not able to palpate any abnormality in the lateral portion of the left breast.  Ultrasound showed normal appearing fibroglandular tissue in the area in question.    More recently, about September, the patient felt again something like a lump in the left breast and she had some pain associated with this.  This was initially felt simply to be mastalgia and was treated as such, but as it persisted, on December 23rd, the patient had left diagnostic mammography and ultrasonography.  There was a focally dense area in the left breast with very minimal distortion corresponding to the area of palpable abnormality. The ultrasound showed an irregular ill-defined, hypoechoic area with distal shadowing, measuring approximately 1 cm corresponding to the patient's palpable abnormality.    With this information, the patient was brought back for ultrasound-guided core biopsy and this was performed December 28th.  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  There was no evidence of angiolymphatic invasion in the material submitted which measured 1.5 cm maximally.  In addition to the mass in the breast, a lymph node in the left axilla was biopsied and was likewise positive.  The tumor was ER positive at 89%, PR positive at 34% with an elevated MIB-1 at 44% and Her-2 negative with a ratio of 1.13.    Her subsequent history is as detailed below.  INTERVAL HISTORY: Brooke [name] returns today with her husband Brooke [name] for followup of her metastatic breast cancer. She just had her restaging PET scan and I also obtained an MRI of her right hip. The PET scan is essentially stable. The MRI of the right hip shows significant amount of tumor in that area, which explains the pain she is experiencing.  REVIEW OF SYSTEMS: Brooke [name]'s right hip pain is worse with lying or sitting. It is better with walking. In addition to that pain however she has developed some lower back pain bilaterally (she points to about the T10 level) and the pain somehow moves up to her left scapula as well. She tells me this Brooke [name] somewhere between and 8 and a 9/10 right. She is having "fairly normal" bowel movements. She is not on any narcotics at present. She tells me the Motrin makes her sick and she has stopped taking it. She tried tramadol but it did absolutely nothing (or perhaps may be 10% pain decreased very briefly"). She takes the gabapentin at bedtime chiefly because it makes her sleepy. Aside from these issues she is anxious but denies depression. She is concerned regarding her prognosis. A detailed review of  systems today was otherwise stable  PAST MEDICAL HISTORY: Past Medical History  Diagnosis Date  . Hx Breast Cancer, IDC, Stahe III, receptor + 10/20/2010    left breast  . Breast cancer December 2010    invasive ductal and invasive lobular; bilateral mastectomy and radiation  . Mitral valve prolapse   . History of recurrent UTIs   . Radiation 07/26/09-09/08/09    left breast  . Fracture,  pelvis closed 11/2012    both hipsand number 7 rib, 15 rounds radiation  1. Remote migraines, currently inactive.   2. History of prior diagnosis of fibromyalgia made at [facility] and currently not active (she was treated with trimethoprim and nortriptyline for one year for this).    3. History of recurrent UTIs.   4. History of benign left breast cyst aspirated in 2004 at [facility] under Dr. Melene Plan.   5. History of tonsillectomy and adenoidectomy.   6. History of right LASEK surgery. 7. History of bilateral reduction mammoplasties in 1994.   8. History of mitral valve prolapse diagnosed in 1984.  PAST SURGICAL HISTORY: Past Surgical History  Procedure Laterality Date  . Mastectomy Bilateral 2011    left breast cancer  . Lasik    . Reduction mammaplasty Bilateral 1994  . Tonsillectomy and adenoidectomy    . Aspiration of left breast Left 2004    [facility]/Dr.Teo  . Bone biopsy  02/19/13    mets from breast cancer-receptors results not back    FAMILY HISTORY Family History  Problem Relation Age of Onset  . Lung cancer Father 93    smoker for 25 years  . Heart disease Maternal Grandmother   . Heart disease Maternal Grandfather   . Diabetes type I Mother   . Diabetes type I Sister   . Diabetes type I Sister   . Heart disease Maternal Uncle   . Uterine cancer Paternal Grandmother 65  . Breast cancer Other 68    paternal great grandmother  [name] died at the age of 37 from lung cancer.  He had been a remote smoker and had been in [employer] with a question of possible asbestos exposure.  [name] died at the age of 81 in the setting of Alzheimer's, coronary artery disease, diabetes and hypertension.  The patient had two sisters.  One died from complications of diabetes.  The other one is alive with diabetes.    GYNECOLOGIC HISTORY: She is GX P1.  First pregnancy to term at age 33.  The patient's last  period was in 2008.  She took hormone replacement for about 18 months until mid-2008.  She had no complications from that treatment.    SOCIAL HISTORY: (Updated October 2014) She used to be a Arts development officer for [employer]  They used to live in the [place] area. 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  Her husband, Brooke [name], retired October 2012.  He was a Arts development officer for 28 years.  Their son, [name], is currently with the [employer] at the [employer] in [place]. [name].  She attends [employer].   ADVANCED DIRECTIVES: in place  HEALTH MAINTENANCE: (Updated 11/19/2012) History  Substance Use Topics  . Smoking status: Former Smoker -- 1.00 packs/day for 11 years    Quit date: 01/23/1984  . Smokeless tobacco: Never Used  . Alcohol Use: 1.5 - 2.5 oz/week  3-5 drink(s) per week     Comment: glasses of wine     Colonoscopy: 2010  PAP: Jan 2014  Bone density: July 2014, osteopenia  Lipid panel: per Dr Valentina Lucks  Allergies  Allergen Reactions  . Sulfa Antibiotics Shortness Of Breath  . Codeine Nausea And Vomiting    Tolerates oxycodone if given with prochlorperazine for nausea    Current Outpatient Prescriptions  Medication Sig Dispense Refill  . ALPRAZolam (XANAX XR) 1 MG 24 hr tablet Take 1 mg by mouth 2 (two) times daily as needed for anxiety.       . Calcium Citrate (CITRACAL PO) Take 3 tablets by mouth daily.      . cholecalciferol (VITAMIN D) 400 UNITS TABS tablet Take 400 Units by mouth daily. Patient reportd the Vitamin E should be vitamin D with same dose      . diphenhydrAMINE (BENADRYL) 25 mg capsule Take 25 mg by mouth at bedtime as needed for sleep.       . fulvestrant (FASLODEX) 250 MG/5ML injection Inject 250 mg into the muscle every 30 (thirty) days. One injection each buttock over 1-2 minutes. Warm prior to use.      . gabapentin (NEURONTIN) 300 MG capsule Take 300 mg by  mouth at bedtime.       . gabapentin (NEURONTIN) 300 MG capsule TAKE 1 CAPSULE (300 MG TOTAL) BY MOUTH AT BEDTIME.  90 capsule  1  . ketoconazole (NIZORAL) 2 % cream Apply 1 application topically daily.  15 g  0  . naproxen (NAPROSYN) 500 MG tablet Take 500 mg by mouth 2 (two) times daily as needed for moderate pain.       Vladimir Faster Glycol-Propyl Glycol (SYSTANE) 0.4-0.3 % SOLN Apply 1 drop to eye 2 (two) times daily as needed (dry eyes).      . Vaginal Lubricant (REPLENS VA) Place vaginally. Combining with estroglide      . venlafaxine XR (EFFEXOR-XR) 37.5 MG 24 hr capsule TAKE 2 CAPSULES DAILY  180 capsule  1  . Zoledronic Acid (ZOMETA IV) Inject into the vein every 30 (thirty) days.       No current facility-administered medications for this visit.     Filed Vitals:   03/25/13 1616  BP: 126/79  Pulse: 102  Temp: 97.6 F (36.4 C)  Resp: 18     Body mass index is 32.14 kg/(m^2).    ECOG FS: 1 Filed Weights   03/25/13 1616  Weight: 185 lb 11.2 oz (84.233 kg)   Objective: Middle-aged white woman in moderate pain  We did not repeat her recent physical exam today  LAB RESULTS: Lab Results  Component Value Date   WBC 7.1 03/18/2013   NEUTROABS 4.6 03/18/2013   HGB 12.3 03/18/2013   HCT 36.4 03/18/2013   MCV 88.5 03/18/2013   PLT 207 03/18/2013      Chemistry      Component Value Date/Time   NA 141 03/18/2013 0926   NA 142 06/26/2011 0728   NA 143 12/12/2010 1325   K 4.6 03/18/2013 0926   K 4.6 06/26/2011 0728   K 4.7 12/12/2010 1325   CL 105 01/07/2012 1347   CL 102 06/26/2011 0728   CL 105 12/12/2010 1325   CO2 26 03/18/2013 0926   CO2 30 06/26/2011 0728   CO2 27 12/12/2010 1325   BUN 15.8 03/18/2013 0926   BUN 12 06/26/2011 0728   BUN 14 12/12/2010 1325   CREATININE 0.9 03/18/2013 0926  CREATININE 1.1 06/26/2011 0728   CREATININE 0.96 12/12/2010 1325      Component Value Date/Time   CALCIUM 9.7 03/18/2013 0926   CALCIUM 9.1 06/26/2011 0728   CALCIUM 10.1 12/12/2010 1325    ALKPHOS 89 03/18/2013 0926   ALKPHOS 54 12/12/2010 1325   AST 38* 03/18/2013 0926   AST 19 12/12/2010 1325   ALT 34 03/18/2013 0926   ALT 19 12/12/2010 1325   BILITOT 0.29 03/18/2013 0926   BILITOT 0.3 12/12/2010 1325       Lab Results  Component Value Date   LABCA2 21 01/07/2012    STUDIES: Mr Hip Right W Wo Contrast  03/23/2013   CLINICAL DATA:  Patient with known metastatic breast cancer. Status post radiation therapy to both hips September, 2014 with temporary alleviation of pain. Pain is returned.  EXAM: MRI OF THE RIGHT HIP WITHOUT AND WITH CONTRAST  TECHNIQUE: Multiplanar, multisequence MR imaging was performed both before and after administration of intravenous contrast.  CONTRAST:  15 mL MULTIHANCE GADOBENATE DIMEGLUMINE 529 MG/ML IV SOLN  COMPARISON:  PET CT scan earlier this same day. CT abdomen and pelvis 12/03/2012.  FINDINGS: Extensive osseous metastatic disease is present. The largest deposits are seen throughout the sacrum and in the posterior iliac wings bilaterally adjacent to the sacrum. Large deposits are also present anterior iliac wings bilaterally. The L4 and L5 vertebral bodies are completely replaced with tumor. There is a large deposit in the anterior right acetabulum. The right superior and inferior pubic rami are completely replaced with tumor. Scattered small deposits are seen in the femoral necks and proximal diaphysis of each femur. No fracture is identified. No soft tissue tumor deposit is seen.  Pelvic musculature is intact and unremarkable. No notable degenerative change is present about the hips. There is no hip joint effusion. No evidence of bursitis is identified. Imaged intrapelvic contents are unremarkable.  IMPRESSION: The examination is positive for extensive osseous metastatic disease. Given right hip pain, note is made of a large deposit in the anterior wall of the right acetabulum and complete replacement of the right superior and inferior pubic rami by  tumors. The L4 and L5 vertebral bodies are completely replaced by tumor and extensive deposits are also present in the sacrum posterior iliac wings bilaterally.  Negative for fracture.   Electronically Signed   By: Inge Rise M.D.   On: 03/23/2013 12:30   Nm Pet Image Restag (ps) Skull Base To Thigh  03/23/2013   CLINICAL DATA:  Subsequent treatment strategy for restaging of metastatic left breast cancer.  EXAM: NUCLEAR MEDICINE PET SKULL BASE TO THIGH  FASTING BLOOD GLUCOSE:  Value: 98 mg/dl  TECHNIQUE: 9.6 mCi F-18 FDG was injected intravenously. Full-ring PET imaging was performed from the skull base to thigh after the radiotracer. CT data was obtained and used for attenuation correction and anatomic localization.  COMPARISON:  CT BIOPSY dated 02/19/2013; NM PET IMAGE RESTAG (PS) SKULL BASE TO THIGH dated 02/02/2013  FINDINGS: NECK  No areas of abnormal hypermetabolism.  CHEST  No areas of abnormal hypermetabolism.  ABDOMEN/PELVIS  No areas of abnormal hypermetabolism.  SKELETON  Redemonstration of extensive osseous metastasis. An index left iliac wing lesion measures a S.U.V. max of 7.1. On the prior exam, this measured a S.U.V. max of 6.5. Image 146 today.  Posterior right iliac lesion measures a S.U.V. max of 6.9 on image 142/series 4. On the prior exam, this measured a S.U.V. max of 6.4.  Other lesions demonstrate a  similar slight increase in hypermetabolism (likely attributed to differences in scan technique today as detailed below).  CT IMAGES PERFORMED FOR ATTENUATION CORRECTION  No significant findings within the neck. Status post bilateral mastectomy and left axillary node dissection. Increased sclerosis of some osseous metastasis. Example anterior lateral sixth right rib. This is attributed to interval healing.  As of February 2015, [facility] utilizes time-of-flight PET imaging.This new technology can increase SUV measurements by 16- 20 % over conventional PET technology.  IMPRESSION:  1. Extensive osseous metastasis. Given differences in scan technique, felt to be similar. Apparent slight increase in hypermetabolism is likely attributed to the time-of-flight technique used on the new scanner. Please see above 2. No evidence of extraosseous metastasis.   Electronically Signed   By: Abigail Miyamoto M.D.   On: 03/23/2013 11:29    ASSESSMENT: 63 y.o. [place] woman   (1)  status post Left breast and Left axillary lymph node biopsy Dec 2010 for a cT3 pN1 (stage IIIA) invasive ductal carcinoma, grade 2, estrogen and progesterone receptor positive, HER-2 negative, with an MIB-1 of 44%;   (2)  treated neoadjuvantly with dose dense cyclophosphamide and doxorubicin x4 followed by weekly paclitaxel x7  (3) s/p bilateral mastectomies May 2011. She had a residual 1.5 cm tumor, and one of 8 sampled lymph nodes was positive [ypT1c ypN1]. Margins were negative.   (4)  She completed post-mastectomy radiation in August of 2011   (5) on letrozole between August 2011 and October 2014  (6) PET scan 11/05/2012 obtained to evaluate right chest wall pain documents bony metastatic disease to the right seventh rib, L1, and the right acetabulum with an associated pathologic fracture through the anterior cortex of the acetabulum measuring 3.2 cm. There was no evidence of extra osseous metastatic disease.  (7)  as of 11/05/2012, receiving fulvestrant injections, 500 mg monthly, in addition to monthly infusions of zoledronic acid.  (8) Right rib, left hip and right hip were all treated to 37.5 Gy in 15 fractions at 2.5 Gy per fraction completed 12/17/2012  (9) 02/19/2013 bone marrow biopsy confirms metastatic disease to bone. HER-2 was not done because of concerns regarding because of fine the specimen. Estrogen and progesterone receptors were read as negative. This is discordant.   PLAN:  We spent over 60 minutes going over Brooke [name]'ss situation. It is all I had was the PET scan I would say her disease is  stable. The problem is that she is having significant pain in the pain is worse. They have made sure the images and labs have gone to Dr. Benjie Karvonen at [facility] and I will appreciate consulting with her regarding whether we should continue the current treatment, namely fulvestrant, switch to chemotherapy (she had previously suggested capecitabine), or perhaps consider a study at this point.  What does seem clear is that that has not been able to control her pain with Neurontin, Naprosyn, or tramadol. We're going to start on MS Contin 15 mg twice a day which she understands is not as needed, but is scheduled. I gave her 60 of those tablets, I wrote her for MSIR 15 mg to take 1 or 2 tablets every 4 hours as needed and I gave her 120 of those tablets.  She has a good understanding of the possible toxicities, side effects and complications of narcotics and in particular she must start a bowel prophylaxis regimen now which will consist of stool softeners 2 tablets twice daily MiraLAX daily and if no bowel movement occurs  by the third day call us so she can get further instructions.  She understands this she is using the narcotics properly she will be able to behave more normally. The idea is to be able to take a walk or get on an exercise bike rather than sleep all the time. We also talked about the pain scale between one and 10. Currently her pain is somewhere between 8 and 9. I do think that is about where it is. S3 pain as I understand it is about as high as the pain can go when she can ignore it for a period, perhaps when she is very involved with a TV program. If for is when the pain keeps breaking through even though she is involved in an activity like a TV program or something similar. Whenever the pain is severe for she needs to take some additional pain medicine.  It will be at least 2 days before we have a better idea how much more faint Brooke [name] we'll actually need so she will be calling me Friday morning with a  report of how much he has needed and what results as obtained. She will call me again Monday morning and Wednesday morning. I have made her a return appointment here Thursday morning so that we can implement any changes in her overall treatment plan suggested by [facility].  Brooke [name] presents to me to give her a prognosis. She has been reading "more coalescence" and thought perhaps I was just wanting to be positive or was afraid to give her bad news. We went over the study that showed that doctors are wildly off him a when they tried to prognosticate exact times of death. This is true even a very experienced physician's. My suggestion to her is that she began that she is going to die in 2 weeks so she gets the main things accomplish this he needs to accomplish. Then she can hope that she will live several years, which is not at all impossible in her situation.  Hopefully once we get the pain control at this mood will improve although I must say she is putting a very brave face on things. Silva Bandy very helpful. They know to call me for any problems that may develop before her next visit here.  Chauncey Cruel, MD 03/25/2013

## 2013-03-27 ENCOUNTER — Ambulatory Visit: Payer: Medicare Other | Admitting: Oncology

## 2013-03-27 ENCOUNTER — Telehealth: Payer: Self-pay | Admitting: *Deleted

## 2013-03-27 ENCOUNTER — Inpatient Hospital Stay (HOSPITAL_COMMUNITY)
Admission: AD | Admit: 2013-03-27 | Discharge: 2013-03-29 | DRG: 948 | Disposition: A | Discharge status: Home / Self Care | Payer: Medicare Other | Source: Ambulatory Visit | Attending: Oncology | Admitting: Oncology

## 2013-03-27 ENCOUNTER — Other Ambulatory Visit: Payer: Self-pay | Admitting: *Deleted

## 2013-03-27 ENCOUNTER — Ambulatory Visit (HOSPITAL_BASED_OUTPATIENT_CLINIC_OR_DEPARTMENT_OTHER): Payer: Medicare Other

## 2013-03-27 DIAGNOSIS — C50519 Malignant neoplasm of lower-outer quadrant of unspecified female breast: Secondary | ICD-10-CM

## 2013-03-27 DIAGNOSIS — Z170 Estrogen receptor positive status [ER+]: Secondary | ICD-10-CM

## 2013-03-27 DIAGNOSIS — C50919 Malignant neoplasm of unspecified site of unspecified female breast: Secondary | ICD-10-CM | POA: Diagnosis present

## 2013-03-27 DIAGNOSIS — Z87440 Personal history of urinary (tract) infections: Secondary | ICD-10-CM

## 2013-03-27 DIAGNOSIS — C7951 Secondary malignant neoplasm of bone: Secondary | ICD-10-CM

## 2013-03-27 DIAGNOSIS — R112 Nausea with vomiting, unspecified: Secondary | ICD-10-CM | POA: Diagnosis present

## 2013-03-27 DIAGNOSIS — F411 Generalized anxiety disorder: Secondary | ICD-10-CM | POA: Diagnosis present

## 2013-03-27 DIAGNOSIS — C7952 Secondary malignant neoplasm of bone marrow: Secondary | ICD-10-CM

## 2013-03-27 DIAGNOSIS — R42 Dizziness and giddiness: Secondary | ICD-10-CM

## 2013-03-27 DIAGNOSIS — Z833 Family history of diabetes mellitus: Secondary | ICD-10-CM

## 2013-03-27 DIAGNOSIS — F329 Major depressive disorder, single episode, unspecified: Secondary | ICD-10-CM | POA: Diagnosis present

## 2013-03-27 DIAGNOSIS — G893 Neoplasm related pain (acute) (chronic): Principal | ICD-10-CM | POA: Diagnosis present

## 2013-03-27 DIAGNOSIS — Z6831 Body mass index (BMI) 31.0-31.9, adult: Secondary | ICD-10-CM

## 2013-03-27 DIAGNOSIS — T4275XA Adverse effect of unspecified antiepileptic and sedative-hypnotic drugs, initial encounter: Secondary | ICD-10-CM | POA: Diagnosis present

## 2013-03-27 DIAGNOSIS — F3289 Other specified depressive episodes: Secondary | ICD-10-CM | POA: Diagnosis present

## 2013-03-27 DIAGNOSIS — Z803 Family history of malignant neoplasm of breast: Secondary | ICD-10-CM

## 2013-03-27 DIAGNOSIS — Z801 Family history of malignant neoplasm of trachea, bronchus and lung: Secondary | ICD-10-CM

## 2013-03-27 DIAGNOSIS — Z923 Personal history of irradiation: Secondary | ICD-10-CM

## 2013-03-27 DIAGNOSIS — C50512 Malignant neoplasm of lower-outer quadrant of left female breast: Secondary | ICD-10-CM

## 2013-03-27 DIAGNOSIS — R1115 Cyclical vomiting syndrome unrelated to migraine: Secondary | ICD-10-CM | POA: Diagnosis present

## 2013-03-27 DIAGNOSIS — M25551 Pain in right hip: Secondary | ICD-10-CM

## 2013-03-27 DIAGNOSIS — C773 Secondary and unspecified malignant neoplasm of axilla and upper limb lymph nodes: Secondary | ICD-10-CM

## 2013-03-27 DIAGNOSIS — Z9010 Acquired absence of unspecified breast and nipple: Secondary | ICD-10-CM

## 2013-03-27 DIAGNOSIS — M79603 Pain in arm, unspecified: Secondary | ICD-10-CM

## 2013-03-27 DIAGNOSIS — E860 Dehydration: Secondary | ICD-10-CM | POA: Diagnosis present

## 2013-03-27 DIAGNOSIS — R0781 Pleurodynia: Secondary | ICD-10-CM

## 2013-03-27 DIAGNOSIS — Z8249 Family history of ischemic heart disease and other diseases of the circulatory system: Secondary | ICD-10-CM

## 2013-03-27 DIAGNOSIS — M25559 Pain in unspecified hip: Secondary | ICD-10-CM | POA: Diagnosis present

## 2013-03-27 LAB — COMPREHENSIVE METABOLIC PANEL
ALBUMIN: 3.2 g/dL — AB (ref 3.5–5.2)
ALT: 30 U/L (ref 0–35)
AST: 35 U/L (ref 0–37)
Alkaline Phosphatase: 74 U/L (ref 39–117)
BUN: 12 mg/dL (ref 6–23)
CO2: 22 mEq/L (ref 19–32)
Calcium: 8.8 mg/dL (ref 8.4–10.5)
Chloride: 109 mEq/L (ref 96–112)
Creatinine, Ser: 0.74 mg/dL (ref 0.50–1.10)
GFR calc Af Amer: >90 mL/min (ref >90)
GFR calc non Af Amer: >90 mL/min (ref >90)
Glucose, Bld: 89 mg/dL (ref 70–99)
Potassium: 4.3 mEq/L (ref 3.7–5.3)
Sodium: 143 mEq/L (ref 137–147)
Total Bilirubin: <0.2 mg/dL — ABNORMAL LOW (ref 0.3–1.2)
Total Protein: 6 g/dL (ref 6.0–8.3)

## 2013-03-27 LAB — CBC WITH DIFFERENTIAL/PLATELET
Basophils Absolute: 0 10*3/uL (ref 0.0–0.1)
Basophils Relative: 0 % (ref 0–1)
EOS ABS: 0.1 10*3/uL (ref 0.0–0.7)
Eosinophils Relative: 1 % (ref 0–5)
HCT: 31.5 % — ABNORMAL LOW (ref 36.0–46.0)
HEMOGLOBIN: 10.4 g/dL — AB (ref 12.0–15.0)
LYMPHS ABS: 1.4 10*3/uL (ref 0.7–4.0)
Lymphocytes Relative: 21 % (ref 12–46)
MCH: 29.5 pg (ref 26.0–34.0)
MCHC: 33 g/dL (ref 30.0–36.0)
MCV: 89.2 fL (ref 78.0–100.0)
MONOS PCT: 8 % (ref 3–12)
Monocytes Absolute: 0.5 10*3/uL (ref 0.1–1.0)
NEUTROS PCT: 70 % (ref 43–77)
Neutro Abs: 4.6 10*3/uL (ref 1.7–7.7)
Platelets: 168 10*3/uL (ref 150–400)
RBC: 3.53 MIL/uL — ABNORMAL LOW (ref 3.87–5.11)
RDW: 12.3 % (ref 11.5–15.5)
WBC: 6.6 10*3/uL (ref 4.0–10.5)

## 2013-03-27 MED ORDER — ENOXAPARIN SODIUM 40 MG/0.4ML ~~LOC~~ SOLN
40.0000 mg | SUBCUTANEOUS | Status: DC
  Administered 2013-03-27 – 2013-03-28 (×2): 40 mg via SUBCUTANEOUS
  Filled 2013-03-27 (×3): qty 0.4

## 2013-03-27 MED ORDER — VENLAFAXINE HCL ER 75 MG PO CP24
75.0000 mg | ORAL_CAPSULE | ORAL | Status: AC
  Administered 2013-03-27: 75 mg via ORAL
  Filled 2013-03-27: qty 1

## 2013-03-27 MED ORDER — LORAZEPAM 2 MG/ML IJ SOLN
INTRAMUSCULAR | Status: AC
  Filled 2013-03-27: qty 1

## 2013-03-27 MED ORDER — LIDOCAINE 5 % EX PTCH
2.0000 | MEDICATED_PATCH | CUTANEOUS | Status: DC
  Administered 2013-03-27: 2 via TRANSDERMAL
  Filled 2013-03-27 (×3): qty 2

## 2013-03-27 MED ORDER — MORPHINE SULFATE ER 15 MG PO TBCR
15.0000 mg | EXTENDED_RELEASE_TABLET | Freq: Two times a day (BID) | ORAL | Status: DC
  Administered 2013-03-27: 15 mg via ORAL
  Filled 2013-03-27 (×2): qty 1

## 2013-03-27 MED ORDER — SODIUM CHLORIDE 0.9 % IV SOLN
INTRAVENOUS | Status: AC
  Administered 2013-03-27: 13:00:00 via INTRAVENOUS

## 2013-03-27 MED ORDER — PROCHLORPERAZINE EDISYLATE 5 MG/ML IJ SOLN
5.0000 mg | Freq: Three times a day (TID) | INTRAMUSCULAR | Status: DC
  Administered 2013-03-27 – 2013-03-28 (×5): 5 mg via INTRAVENOUS
  Administered 2013-03-28: 22:00:00 via INTRAVENOUS
  Administered 2013-03-29: 5 mg via INTRAVENOUS
  Filled 2013-03-27: qty 1
  Filled 2013-03-27: qty 2
  Filled 2013-03-27 (×3): qty 1
  Filled 2013-03-27: qty 2
  Filled 2013-03-27 (×5): qty 1
  Filled 2013-03-27: qty 2

## 2013-03-27 MED ORDER — GABAPENTIN 300 MG PO CAPS
300.0000 mg | ORAL_CAPSULE | Freq: Three times a day (TID) | ORAL | Status: DC
  Administered 2013-03-27 – 2013-03-28 (×4): 300 mg via ORAL
  Filled 2013-03-27 (×8): qty 1

## 2013-03-27 MED ORDER — SODIUM CHLORIDE 0.9 % IV SOLN
INTRAVENOUS | Status: DC
  Administered 2013-03-27 – 2013-03-28 (×3): via INTRAVENOUS

## 2013-03-27 MED ORDER — KETOROLAC TROMETHAMINE 15 MG/ML IJ SOLN
15.0000 mg | Freq: Four times a day (QID) | INTRAMUSCULAR | Status: DC | PRN
  Administered 2013-03-27 (×2): 15 mg via INTRAVENOUS
  Filled 2013-03-27 (×2): qty 1

## 2013-03-27 MED ORDER — POLYETHYL GLYCOL-PROPYL GLYCOL 0.4-0.3 % OP SOLN: 1.0000 [drp] | Freq: Two times a day (BID) | OPHTHALMIC | Status: DC | PRN

## 2013-03-27 MED ORDER — POLYVINYL ALCOHOL 1.4 % OP SOLN
1.0000 [drp] | Freq: Two times a day (BID) | OPHTHALMIC | Status: DC | PRN
  Filled 2013-03-27: qty 15

## 2013-03-27 MED ORDER — LORAZEPAM 2 MG/ML IJ SOLN
0.5000 mg | Freq: Once | INTRAMUSCULAR | Status: AC
  Administered 2013-03-27: 0.5 mg via INTRAVENOUS

## 2013-03-27 MED ORDER — DIPHENHYDRAMINE HCL 25 MG PO CAPS
25.0000 mg | ORAL_CAPSULE | Freq: Every evening | ORAL | Status: DC | PRN
  Administered 2013-03-27 – 2013-03-28 (×2): 25 mg via ORAL
  Filled 2013-03-27 (×2): qty 1

## 2013-03-27 MED ORDER — ONDANSETRON 8 MG/NS 50 ML IVPB
8.0000 mg | Freq: Three times a day (TID) | INTRAVENOUS | Status: DC | PRN
  Filled 2013-03-27: qty 8

## 2013-03-27 MED ORDER — ALPRAZOLAM ER 1 MG PO TB24
1.0000 mg | ORAL_TABLET | Freq: Every day | ORAL | Status: DC
  Administered 2013-03-28: 1 mg via ORAL
  Filled 2013-03-27: qty 1

## 2013-03-27 MED ORDER — PANTOPRAZOLE SODIUM 40 MG IV SOLR
40.0000 mg | INTRAVENOUS | Status: DC
  Administered 2013-03-27 – 2013-03-28 (×2): 40 mg via INTRAVENOUS
  Filled 2013-03-27 (×3): qty 40

## 2013-03-27 MED ORDER — DEXAMETHASONE SODIUM PHOSPHATE 4 MG/ML IJ SOLN
4.0000 mg | Freq: Two times a day (BID) | INTRAMUSCULAR | Status: DC
  Administered 2013-03-27 – 2013-03-28 (×3): 4 mg via INTRAVENOUS
  Filled 2013-03-27 (×5): qty 1

## 2013-03-27 MED ORDER — ALPRAZOLAM ER 1 MG PO TB24: 1.0000 mg | ORAL_TABLET | Freq: Two times a day (BID) | ORAL | Status: DC | PRN

## 2013-03-27 NOTE — Progress Notes (Unsigned)
1345 Patient complains of extreme dizziness and states she feels she is going to pass out. Dr. Jana Hakim notified.   1400 Dr. Jana Hakim chairside.  1535 Report given to [facility], [place], Patient transported via wheelchair.

## 2013-03-27 NOTE — Telephone Encounter (Signed)
This RN spoke with [name], pt's husband, this am due to onset of nausea and vomiting post Pat starting Morphine for pain.  Pt began getting nauseated around 2am with vomiting.  Pt has not taken any antinausea medications.  Per review of home medication pt has available prochlorperazine and ondanestron.  Pt also has not taken any of her am medications including xanax ER.  This RN discussed with husband instructions to give pt prochlorperazine and xanax. Pt needs to rest without intake at this time.  This RN will follow up in approximately 1 hour .  Appointment made for IV fluids for this afternoon.

## 2013-03-27 NOTE — H&P (Signed)
Brooke [name]  [phone] [phone]     ID: Brooke [name] OB: 05-22-1950  MR#: [id number]  XMI#:[id number]  PCP: Brooke Shelling, MD GYN:   SU:  OTHER MD: Deborra Medina [phone])  CHIEF COMPLAINT: "I've been vomiting all morning."  BREAST CANCER HISTORY: Brooke [name] had screening mammography in August 04, 2007 which showed no specific mammographic evidence of malignancy, but she reported a left breast lump. Accordingly, she was brought back on August 11, 2007 for mammography and ultrasonography. There was indeed an obscured mass in the upper left breast which by ultrasound appeared to be a simple cyst.  Screening mammogram on August 04, 2008 showed in addition to dense breasts, again a possible mass in the left breast. She had diagnostic mammography August 06, 2008 and additional views in addition to very dense breasts did not show any persistent mass or distortion. Furthermore, Dr. Owens Shark was not able to palpate any abnormality in the lateral portion of the left breast. Ultrasound showed normal appearing fibroglandular tissue in the area in question.  More recently, about September, the patient felt again something like a lump in the left breast and she had some pain associated with this. This was initially felt simply to be mastalgia and was treated as such, but as it persisted, on December 23rd, the patient had left diagnostic mammography and ultrasonography. There was a focally dense area in the left breast with very minimal distortion corresponding to the area of palpable abnormality. The ultrasound showed an irregular ill-defined, hypoechoic area with distal shadowing, measuring approximately 1 cm corresponding to the patient's palpable abnormality.  With this information, the patient was brought back for ultrasound-guided core biopsy and this was performed December 28th. 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 There was no evidence of angiolymphatic invasion in the material submitted which measured 1.5 cm maximally. In addition to the mass in the breast, a lymph node in the left axilla was biopsied and was likewise positive. The tumor was ER positive at 89%, PR positive at 34% with an elevated MIB-1 at 44% and Her-2 negative with a ratio of 1.13.  Her subsequent history is as detailed below.  INTERVAL HISTORY: Brooke [name] has been having increasing pain in her R hip, where she has significant tumor involvement (the area has already been irradiated). She was started on MSCONTIN 15 mg po Q12h yeasterday evening. She seemed to tolerate that well.This morning (03/27/2013) she took a second dose and in addition two MSIR 15 mg. This resulted in uncontrolled vomiting, which brought her to the office, where the nausea was not well controlled despite hydration and antiemetics. She is being admitted for IV antiemetics, further hydration, and evaluation whether she can tolerate a lower dose of morphine or will have to be changed to a different narcotic.  REVIEW OF SYSTEMS: The patient tells me her pain got 'a bit better" with the [facility]. In the treatment area she was unable to give me a "number" of her pain. She has not had anything to eat today and has not taken her routine medicaitons.   PAST MEDICAL HISTORY: Past Medical History  Diagnosis Date  . Hx Breast Cancer, IDC, Stahe III, receptor + 10/20/2010    left breast  . Breast cancer December 2010    invasive ductal and invasive lobular; bilateral mastectomy and radiation  . Mitral valve prolapse   . History of recurrent UTIs   . Radiation 07/26/09-09/08/09  left breast  . Fracture, pelvis closed 11/2012    both hipsand number 7 rib, 15 rounds radiation    PAST SURGICAL HISTORY: Past Surgical History  Procedure Laterality Date  . Mastectomy Bilateral 2011    left breast cancer  . Lasik    . Reduction mammaplasty Bilateral 1994  .  Tonsillectomy and adenoidectomy    . Aspiration of left breast Left 2004    [facility]/Dr.Teo  . Bone biopsy  02/19/13    mets from breast cancer-receptors results not back    FAMILY HISTORY Family History  Problem Relation Age of Onset  . Lung cancer Father 72    smoker for 25 years  . Heart disease Maternal Grandmother   . Heart disease Maternal Grandfather   . Diabetes type I Mother   . Diabetes type I Sister   . Diabetes type I Sister   . Heart disease Maternal Uncle   . Uterine cancer Paternal Grandmother 48  . Breast cancer Other 9    paternal great grandmother  [name] died at the age of 81 from lung cancer. He had been a remote smoker and had been in [employer] with a question of possible asbestos exposure. [name] died at the age of 44 in the setting of Alzheimer's, coronary artery disease, diabetes and hypertension. The patient had two sisters. One died from complications of diabetes. The other one is alive with diabetes.    GYNECOLOGIC HISTORY:  She is GX P1. First pregnancy to term at age 42. The patient's last period was in 2008. She took hormone replacement for about 18 months until mid-2008. She had no complications from that treatment.   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 They used to live in the [place] area. 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 Her husband, [name], retired October 2012. He was a Arts development officer for 28 years. Their son, [name], is currently with the [employer] at the [employer] in [place]. [name]. She attends [employer].      ADVANCED DIRECTIVES: in place   HEALTH MAINTENANCE: History  Substance Use Topics  . Smoking status: Former Smoker -- 1.00 packs/day for 11 years    Quit date: 01/23/1984  . Smokeless tobacco: Never Used  . Alcohol Use: 1.5 - 2.5 oz/week    3-5  drink(s) per week     Comment: glasses of wine    Allergies  Allergen Reactions  . Sulfa Antibiotics Shortness Of Breath  . Codeine Nausea And Vomiting    Tolerates oxycodone if given with prochlorperazine for nausea  . Morphine And Related Nausea And Vomiting    Per patient possibility that the IR morphine was causing the vomiting    Current Facility-Administered Medications  Medication Dose Route Frequency Provider Last Rate Last Dose  . 0.9 %  sodium chloride infusion   Intravenous Continuous Chauncey Cruel, MD      . ALPRAZolam (XANAX XR) 24 hr tablet 1 mg  1 mg Oral BID PRN Chauncey Cruel, MD      . dexamethasone (DECADRON) injection 4 mg  4 mg Intravenous Q12H Chauncey Cruel, MD      . diphenhydrAMINE (BENADRYL) capsule 25 mg  25 mg Oral QHS PRN Chauncey Cruel, MD      . enoxaparin (LOVENOX) injection 40 mg  40 mg Subcutaneous  Q24H Chauncey Cruel, MD      . gabapentin (NEURONTIN) capsule 300 mg  300 mg Oral TID Chauncey Cruel, MD      . ketorolac (TORADOL) 15 MG/ML injection 15 mg  15 mg Intravenous Q6H PRN Chauncey Cruel, MD   15 mg at 03/27/13 1645  . morphine (MS CONTIN) 12 hr tablet 15 mg  15 mg Oral Q12H Chauncey Cruel, MD      . ondansetron (ZOFRAN) 8 mg/NS 50 ml IVPB  8 mg Intravenous Q8H PRN Chauncey Cruel, MD      . polyvinyl alcohol (LIQUIFILM TEARS) 1.4 % ophthalmic solution 1 drop  1 drop Both Eyes BID PRN Chauncey Cruel, MD      . prochlorperazine (COMPAZINE) injection 5 mg  5 mg Intravenous TID AC & HS Chauncey Cruel, MD      . venlafaxine XR (EFFEXOR-XR) 24 hr capsule 75 mg  75 mg Oral 1 day or 1 dose Chauncey Cruel, MD        OBJECTIVE: middle aged white weomane xamined in the treatment area Filed Vitals:   03/27/13 1633  BP: 141/80  Pulse: 83  Temp: 97.7 F (36.5 C)  Resp: 18     Body mass index is 31.74 kg/(m^2).    ECOG FS:2 - Symptomatic, <50% confined to bed  Ocular: Sclerae unicteric, pupils round and  equal Ear-nose-throat: Oropharynx clear Lungs no rales or rhonchi,auscultated anterolaterally Heart regular rate and rhythm Abd soft, positive bowel sounds Neuro: non-focal, well-oriented, stressed affect Breasts: deferred   LAB RESULTS:  CMP     Component Value Date/Time   NA 141 03/18/2013 0926   NA 142 06/26/2011 0728   NA 143 12/12/2010 1325   K 4.6 03/18/2013 0926   K 4.6 06/26/2011 0728   K 4.7 12/12/2010 1325   CL 105 01/07/2012 1347   CL 102 06/26/2011 0728   CL 105 12/12/2010 1325   CO2 26 03/18/2013 0926   CO2 30 06/26/2011 0728   CO2 27 12/12/2010 1325   GLUCOSE 76 03/18/2013 0926   GLUCOSE 89 01/07/2012 1347   GLUCOSE 108 06/26/2011 0728   GLUCOSE 81 12/12/2010 1325   BUN 15.8 03/18/2013 0926   BUN 12 06/26/2011 0728   BUN 14 12/12/2010 1325   CREATININE 0.9 03/18/2013 0926   CREATININE 1.1 06/26/2011 0728   CREATININE 0.96 12/12/2010 1325   CALCIUM 9.7 03/18/2013 0926   CALCIUM 9.1 06/26/2011 0728   CALCIUM 10.1 12/12/2010 1325   PROT 6.6 03/18/2013 0926   PROT 6.2 12/12/2010 1325   ALBUMIN 3.7 03/18/2013 0926   ALBUMIN 4.2 12/12/2010 1325   AST 38* 03/18/2013 0926   AST 19 12/12/2010 1325   ALT 34 03/18/2013 0926   ALT 19 12/12/2010 1325   ALKPHOS 89 03/18/2013 0926   ALKPHOS 54 12/12/2010 1325   BILITOT 0.29 03/18/2013 0926   BILITOT 0.3 12/12/2010 1325   GFRNONAA >60 06/06/2009 0845   GFRAA  Value: >60        The eGFR has been calculated using the MDRD equation. This calculation has not been validated in all clinical situations. eGFR's persistently <60 mL/min signify possible Chronic Kidney Disease. 06/06/2009 0845    I No results found for this basename: SPEP, UPEP,  kappa and lambda light chains    Lab Results  Component Value Date   WBC 6.6 03/27/2013   NEUTROABS 4.6 03/27/2013   HGB 10.4* 03/27/2013   HCT 31.5* 03/27/2013  MCV 89.2 03/27/2013   PLT 168 03/27/2013    @LASTCHEMISTRY @  Lab Results  Component Value Date   LABCA2 21 01/07/2012    No components found  with this basename: [id number]    No results found for this basename: INR,  in the last 168 hours  Urinalysis    Component Value Date/Time   LABSPEC 1.010 12/03/2012 1344   GLUCOSEU Negative 12/03/2012 1344   BILIRUBINUR n 02/26/2013 1033   UROBILINOGEN negative 02/26/2013 1033   UROBILINOGEN 0.2 12/03/2012 1344   NITRITE n 02/26/2013 1033   LEUKOCYTESUR Trace 02/26/2013 1033    STUDIES: Mr Hip Right W Wo Contrast  03/23/2013   CLINICAL DATA:  Patient with known metastatic breast cancer. Status post radiation therapy to both hips September, 2014 with temporary alleviation of pain. Pain is returned.  EXAM: MRI OF THE RIGHT HIP WITHOUT AND WITH CONTRAST  TECHNIQUE: Multiplanar, multisequence MR imaging was performed both before and after administration of intravenous contrast.  CONTRAST:  15 mL MULTIHANCE GADOBENATE DIMEGLUMINE 529 MG/ML IV SOLN  COMPARISON:  PET CT scan earlier this same day. CT abdomen and pelvis 12/03/2012.  FINDINGS: Extensive osseous metastatic disease is present. The largest deposits are seen throughout the sacrum and in the posterior iliac wings bilaterally adjacent to the sacrum. Large deposits are also present anterior iliac wings bilaterally. The L4 and L5 vertebral bodies are completely replaced with tumor. There is a large deposit in the anterior right acetabulum. The right superior and inferior pubic rami are completely replaced with tumor. Scattered small deposits are seen in the femoral necks and proximal diaphysis of each femur. No fracture is identified. No soft tissue tumor deposit is seen.  Pelvic musculature is intact and unremarkable. No notable degenerative change is present about the hips. There is no hip joint effusion. No evidence of bursitis is identified. Imaged intrapelvic contents are unremarkable.  IMPRESSION: The examination is positive for extensive osseous metastatic disease. Given right hip pain, note is made of a large deposit in the anterior wall of the  right acetabulum and complete replacement of the right superior and inferior pubic rami by tumors. The L4 and L5 vertebral bodies are completely replaced by tumor and extensive deposits are also present in the sacrum posterior iliac wings bilaterally.  Negative for fracture.   Electronically Signed   By: Inge Rise M.D.   On: 03/23/2013 12:30   Nm Pet Image Restag (ps) Skull Base To Thigh  03/23/2013   CLINICAL DATA:  Subsequent treatment strategy for restaging of metastatic left breast cancer.  EXAM: NUCLEAR MEDICINE PET SKULL BASE TO THIGH  FASTING BLOOD GLUCOSE:  Value: 98 mg/dl  TECHNIQUE: 9.6 mCi F-18 FDG was injected intravenously. Full-ring PET imaging was performed from the skull base to thigh after the radiotracer. CT data was obtained and used for attenuation correction and anatomic localization.  COMPARISON:  CT BIOPSY dated 02/19/2013; NM PET IMAGE RESTAG (PS) SKULL BASE TO THIGH dated 02/02/2013  FINDINGS: NECK  No areas of abnormal hypermetabolism.  CHEST  No areas of abnormal hypermetabolism.  ABDOMEN/PELVIS  No areas of abnormal hypermetabolism.  SKELETON  Redemonstration of extensive osseous metastasis. An index left iliac wing lesion measures a S.U.V. max of 7.1. On the prior exam, this measured a S.U.V. max of 6.5. Image 146 today.  Posterior right iliac lesion measures a S.U.V. max of 6.9 on image 142/series 4. On the prior exam, this measured a S.U.V. max of 6.4.  Other lesions demonstrate a similar  slight increase in hypermetabolism (likely attributed to differences in scan technique today as detailed below).  CT IMAGES PERFORMED FOR ATTENUATION CORRECTION  No significant findings within the neck. Status post bilateral mastectomy and left axillary node dissection. Increased sclerosis of some osseous metastasis. Example anterior lateral sixth right rib. This is attributed to interval healing.  As of February 2015, [facility] utilizes time-of-flight PET imaging.This new technology  can increase SUV measurements by 16- 20 % over conventional PET technology.  IMPRESSION: 1. Extensive osseous metastasis. Given differences in scan technique, felt to be similar. Apparent slight increase in hypermetabolism is likely attributed to the time-of-flight technique used on the new scanner. Please see above 2. No evidence of extraosseous metastasis.   Electronically Signed   By: Abigail Miyamoto M.D.   On: 03/23/2013 11:29    ASSESSMENT: 63 y.o. [place] woman admitted with poorly controlled pain and severe nausea secondary to narcotics:  (1) status post Left breast and Left axillary lymph node biopsy Dec 2010 for a cT3 pN1 (stage IIIA) invasive ductal carcinoma, grade 2, estrogen and progesterone receptor positive, HER-2 negative, with an MIB-1 of 44%;  (2) treated neoadjuvantly with dose dense cyclophosphamide and doxorubicin x4 followed by weekly paclitaxel x7  (3) s/p bilateral mastectomies May 2011. She had a residual 1.5 cm tumor, and one of 8 sampled lymph nodes was positive [ypT1c ypN1]. Margins were negative.  (4) She completed post-mastectomy radiation in August of 2011  (5) on letrozole between August 2011 and October 2014  (6) PET scan 11/05/2012 obtained to evaluate right chest wall pain documents bony metastatic disease to the right seventh rib, L1, and the right acetabulum with an associated pathologic fracture through the anterior cortex of the acetabulum measuring 3.2 cm. There was no evidence of extra osseous metastatic disease.  (7) as of 11/05/2012, receiving fulvestrant injections, 500 mg monthly, in addition to monthly infusions of zoledronic acid.  (8) Right rib, left hip and right hip were all treated to 37.5 Gy in 15 fractions at 2.5 Gy per fraction completed 12/17/2012  (9) 02/19/2013 bone marrow biopsy confirms metastatic disease to bone. HER-2 was not done because of concerns regarding because of fine the specimen. Estrogen and progesterone receptors were read as  negative. This is discordant.   PLAN: Brooke [name] was started on morphine (CONTIN/ IR) yesterday. She was admitted today with uncontrolled retching. We are going to hydrate her, start IV anti-emetics, and -- for now-- continue the MSCONTIN at 2m po BID. If she is feeling better tomorrow we will try to add the IR at a lower does (15 mg Q6 PRN), and if she tolerates that at a dose sufficient to control her pain we will start to change the antiemetics from IV to po.   If her nausea persists tomorrow [facility] will [facility] to [facility] pain with naprosyn 500 mg po TIDcc, though she did not tolerate this well previously (she had not started her omeprazole). Alternatively we could try duragesic/ dilaudid or oxycontin/oxycodone, or other analgesic at cross-cover's discretion.   I will round on this patient Sunday. Appreciate my partner's help with Ms JNoletSaturday!   MChauncey Cruel MD   03/27/2013 5:50 PM

## 2013-03-27 NOTE — Patient Instructions (Signed)
Dehydration, Adult Dehydration is when you lose more fluids from the body than you take in. Vital organs like the kidneys, brain, and heart cannot function without a proper amount of fluids and salt. Any loss of fluids from the body can cause dehydration.  CAUSES   Vomiting.  Diarrhea.  Excessive sweating.  Excessive urine output.  Fever. SYMPTOMS  Mild dehydration  Thirst.  Dry lips.  Slightly dry mouth. Moderate dehydration  Very dry mouth.  Sunken eyes.  Skin does not bounce back quickly when lightly pinched and released.  Dark urine and decreased urine production.  Decreased tear production.  Headache. Severe dehydration  Very dry mouth.  Extreme thirst.  Rapid, weak pulse (more than 100 beats per minute at rest).  Cold hands and feet.  Not able to sweat in spite of heat and temperature.  Rapid breathing.  Blue lips.  Confusion and lethargy.  Difficulty being awakened.  Minimal urine production.  No tears. DIAGNOSIS  Your caregiver will diagnose dehydration based on your symptoms and your exam. Blood and urine tests will help confirm the diagnosis. The diagnostic evaluation should also identify the cause of dehydration. TREATMENT  Treatment of mild or moderate dehydration can often be done at home by increasing the amount of fluids that you drink. It is best to drink small amounts of fluid more often. Drinking too much at one time can make vomiting worse. Refer to the home care instructions below. Severe dehydration needs to be treated at the hospital where you will probably be given intravenous (IV) fluids that contain water and electrolytes. HOME CARE INSTRUCTIONS   Ask your caregiver about specific rehydration instructions.  Drink enough fluids to keep your urine clear or pale yellow.  Drink small amounts frequently if you have nausea and vomiting.  Eat as you normally do.  Avoid:  Foods or drinks high in sugar.  Carbonated  drinks.  Juice.  Extremely hot or cold fluids.  Drinks with caffeine.  Fatty, greasy foods.  Alcohol.  Tobacco.  Overeating.  Gelatin desserts.  Wash your hands well to avoid spreading bacteria and viruses.  Only take over-the-counter or prescription medicines for pain, discomfort, or fever as directed by your caregiver.  Ask your caregiver if you should continue all prescribed and over-the-counter medicines.  Keep all follow-up appointments with your caregiver. SEEK MEDICAL CARE IF:  You have abdominal pain and it increases or stays in one area (localizes).  You have a rash, stiff neck, or severe headache.  You are irritable, sleepy, or difficult to awaken.  You are weak, dizzy, or extremely thirsty. SEEK IMMEDIATE MEDICAL CARE IF:   You are unable to keep fluids down or you get worse despite treatment.  You have frequent episodes of vomiting or diarrhea.  You have blood or green matter (bile) in your vomit.  You have blood in your stool or your stool looks black and tarry.  You have not urinated in 6 to 8 hours, or you have only urinated a small amount of very dark urine.  You have a fever.  You faint. MAKE SURE YOU:   Understand these instructions.  Will watch your condition.  Will get help right away if you are not doing well or get worse. Document Released: 01/08/2005 Document Revised: 04/02/2011 Document Reviewed: 08/28/2010 Exit[employer] Patient Information 2014 [employer].  

## 2013-03-28 ENCOUNTER — Encounter (HOSPITAL_COMMUNITY): Payer: Self-pay | Admitting: *Deleted

## 2013-03-28 DIAGNOSIS — F329 Major depressive disorder, single episode, unspecified: Secondary | ICD-10-CM

## 2013-03-28 DIAGNOSIS — M25559 Pain in unspecified hip: Secondary | ICD-10-CM

## 2013-03-28 DIAGNOSIS — R52 Pain, unspecified: Secondary | ICD-10-CM

## 2013-03-28 DIAGNOSIS — F411 Generalized anxiety disorder: Secondary | ICD-10-CM

## 2013-03-28 DIAGNOSIS — F3289 Other specified depressive episodes: Secondary | ICD-10-CM

## 2013-03-28 LAB — COMPREHENSIVE METABOLIC PANEL
ALK PHOS: 73 U/L (ref 39–117)
ALT: 32 U/L (ref 0–35)
AST: 34 U/L (ref 0–37)
Albumin: 3.3 g/dL — ABNORMAL LOW (ref 3.5–5.2)
BUN: 13 mg/dL (ref 6–23)
CO2: 23 meq/L (ref 19–32)
Calcium: 8.7 mg/dL (ref 8.4–10.5)
Chloride: 107 mEq/L (ref 96–112)
Creatinine, Ser: 0.86 mg/dL (ref 0.50–1.10)
GFR calc non Af Amer: 72 mL/min — ABNORMAL LOW (ref >90)
GFR, EST AFRICAN AMERICAN: 83 mL/min — AB (ref >90)
GLUCOSE: 135 mg/dL — AB (ref 70–99)
Potassium: 4.7 mEq/L (ref 3.7–5.3)
SODIUM: 139 meq/L (ref 137–147)
Total Bilirubin: <0.2 mg/dL — ABNORMAL LOW (ref 0.3–1.2)
Total Protein: 5.9 g/dL — ABNORMAL LOW (ref 6.0–8.3)

## 2013-03-28 LAB — CBC WITH DIFFERENTIAL/PLATELET
Basophils Absolute: 0 10*3/uL (ref 0.0–0.1)
Basophils Relative: 0 % (ref 0–1)
EOS ABS: 0 10*3/uL (ref 0.0–0.7)
Eosinophils Relative: 1 % (ref 0–5)
HCT: 33.7 % — ABNORMAL LOW (ref 36.0–46.0)
Hemoglobin: 11.1 g/dL — ABNORMAL LOW (ref 12.0–15.0)
LYMPHS ABS: 1 10*3/uL (ref 0.7–4.0)
LYMPHS PCT: 15 % (ref 12–46)
MCH: 29.4 pg (ref 26.0–34.0)
MCHC: 32.9 g/dL (ref 30.0–36.0)
MCV: 89.2 fL (ref 78.0–100.0)
Monocytes Absolute: 0.3 10*3/uL (ref 0.1–1.0)
Monocytes Relative: 5 % (ref 3–12)
NEUTROS PCT: 80 % — AB (ref 43–77)
Neutro Abs: 5.3 10*3/uL (ref 1.7–7.7)
PLATELETS: 165 10*3/uL (ref 150–400)
RBC: 3.78 MIL/uL — AB (ref 3.87–5.11)
RDW: 12.4 % (ref 11.5–15.5)
WBC: 6.6 10*3/uL (ref 4.0–10.5)

## 2013-03-28 MED ORDER — MORPHINE SULFATE 15 MG PO TABS: 15.0000 mg | ORAL_TABLET | Freq: Four times a day (QID) | ORAL | Status: DC | PRN

## 2013-03-28 MED ORDER — HYDROMORPHONE HCL 2 MG PO TABS
2.0000 mg | ORAL_TABLET | Freq: Four times a day (QID) | ORAL | Status: DC | PRN
  Administered 2013-03-28 – 2013-03-29 (×2): 2 mg via ORAL
  Filled 2013-03-28 (×2): qty 1

## 2013-03-28 MED ORDER — VENLAFAXINE HCL ER 75 MG PO CP24
75.0000 mg | ORAL_CAPSULE | Freq: Every day | ORAL | Status: DC
  Administered 2013-03-28: 75 mg via ORAL
  Filled 2013-03-28 (×2): qty 1

## 2013-03-28 NOTE — Progress Notes (Signed)
Dr.Magrinat, Could you please continue patient's effexor XR medication this am.  Patient takes this daily.Azzie Glatter [place]

## 2013-03-28 NOTE — Progress Notes (Addendum)
Subjective: The patient is seen and examined today. Her son and husband where at the bedside. She is feeling better today with much better pain control on the left hip area. She is currently on MS Contin 15 mg by mouth twice a day. She denied having any significant nausea or vomiting. She has no chest pain, shortness breath, cough or hemoptysis. She denied having any significant fever or chills.  Objective: Vital signs in last 24 hours: Temp:  [97.7 F (36.5 C)-98.2 F (36.8 C)] 98.2 F (36.8 C) (03/07 0635) Pulse Rate:  [67-83] 73 (03/07 0635) Resp:  [18-20] 20 (03/07 0635) BP: (112-141)/(54-80) 112/54 mmHg (03/07 0635) SpO2:  [94 %-100 %] 94 % (03/07 0635) Weight:  [185 lb (83.915 kg)] 185 lb (83.915 kg) (03/06 1633)  Intake/Output from previous day: 03/06 0701 - 03/07 0700 In: 1675 [P.O.:480; I.V.:1195] Out: -  Intake/Output this shift:    General appearance: alert, cooperative and no distress Resp: clear to auscultation bilaterally Cardio: regular rate and rhythm, S1, S2 normal, no murmur, click, rub or gallop GI: soft, non-tender; bowel sounds normal; no masses,  no organomegaly Extremities: extremities normal, atraumatic, no cyanosis or edema  Lab Results:   Recent Labs  03/27/13 1719 03/28/13 0346  WBC 6.6 6.6  HGB 10.4* 11.1*  HCT 31.5* 33.7*  PLT 168 165   BMET  Recent Labs  03/27/13 1719 03/28/13 0346  NA 143 139  K 4.3 4.7  CL 109 107  CO2 22 23  GLUCOSE 89 135*  BUN 12 13  CREATININE 0.74 0.86  CALCIUM 8.8 8.7    Studies/Results: No results found.  Medications: I have reviewed the patient's current medications.   Assessment/Plan: 1) metastatic left breast adenocarcinoma status post several chemotherapy regimens as dictated by Dr. Jana Hakim on the previous notes. Last treatment was with fulvestrant. She is also on Zometa for bone disease. 2) bilateral hip pain: status post palliative radiotherapy.  3) pain management: the patient will continue  on MS Contin 15 mg by mouth every 12 hours. I would introduce MSIR 15 mg by mouth Q6 hours as needed for breakthrough pain. We will monitor the patient closely with his treatment for any nausea or vomiting. If she is not tolerating the morphine would switch her to a different treatment for breakthrough pain. 4) history of depression: she would continue on Effexor 5) anxiety: continue her Xanax.    LOS: 1 day    Raphaella Larkin K. 03/28/2013  Addendum:  I received a call from the nurse [facility] requesting me to speak to the patient about her pain management again. The patient is concerned about resuming the treatment with MS Contin because she started having some nausea and vomiting as well as dizzy episode after walking a little bit this morning. She still think that this is related to the MS Contin from last night. I discussed with her several options including treatment with Diluadid every 4- 6 hours plus/minus fentanyl patch starting at a lower dose of 25 mcg/hour every 3 days. The patient is interested in treatment with Dilaudid for now. I would consider adding the fentanyl patch tomorrow if she is tolerating Dilaudid well. I would discontinue MS Contin and MS IR at this point.

## 2013-03-29 DIAGNOSIS — R112 Nausea with vomiting, unspecified: Secondary | ICD-10-CM

## 2013-03-29 DIAGNOSIS — E860 Dehydration: Secondary | ICD-10-CM

## 2013-03-29 DIAGNOSIS — R42 Dizziness and giddiness: Secondary | ICD-10-CM

## 2013-03-29 LAB — COMPREHENSIVE METABOLIC PANEL
ALBUMIN: 2.9 g/dL — AB (ref 3.5–5.2)
ALT: 42 U/L — ABNORMAL HIGH (ref 0–35)
AST: 49 U/L — ABNORMAL HIGH (ref 0–37)
Alkaline Phosphatase: 65 U/L (ref 39–117)
BUN: 10 mg/dL (ref 6–23)
CALCIUM: 7.8 mg/dL — AB (ref 8.4–10.5)
CHLORIDE: 111 meq/L (ref 96–112)
CO2: 18 mEq/L — ABNORMAL LOW (ref 19–32)
CREATININE: 0.72 mg/dL (ref 0.50–1.10)
GFR calc Af Amer: >90 mL/min (ref >90)
GFR calc non Af Amer: >90 mL/min (ref >90)
Glucose, Bld: 136 mg/dL — ABNORMAL HIGH (ref 70–99)
Potassium: 3.7 mEq/L (ref 3.7–5.3)
Sodium: 141 mEq/L (ref 137–147)
Total Bilirubin: <0.2 mg/dL — ABNORMAL LOW (ref 0.3–1.2)
Total Protein: 5.5 g/dL — ABNORMAL LOW (ref 6.0–8.3)

## 2013-03-29 LAB — CBC WITH DIFFERENTIAL/PLATELET
BASOS PCT: 0 % (ref 0–1)
Basophils Absolute: 0 10*3/uL (ref 0.0–0.1)
EOS ABS: 0 10*3/uL (ref 0.0–0.7)
EOS PCT: 0 % (ref 0–5)
HEMATOCRIT: 29 % — AB (ref 36.0–46.0)
Hemoglobin: 9.7 g/dL — ABNORMAL LOW (ref 12.0–15.0)
Lymphocytes Relative: 13 % (ref 12–46)
Lymphs Abs: 0.9 10*3/uL (ref 0.7–4.0)
MCH: 29.5 pg (ref 26.0–34.0)
MCHC: 33.4 g/dL (ref 30.0–36.0)
MCV: 88.1 fL (ref 78.0–100.0)
MONO ABS: 0.3 10*3/uL (ref 0.1–1.0)
Monocytes Relative: 4 % (ref 3–12)
NEUTROS ABS: 5.7 10*3/uL (ref 1.7–7.7)
Neutrophils Relative %: 82 % — ABNORMAL HIGH (ref 43–77)
Platelets: 165 10*3/uL (ref 150–400)
RBC: 3.29 MIL/uL — ABNORMAL LOW (ref 3.87–5.11)
RDW: 12.4 % (ref 11.5–15.5)
WBC: 7 10*3/uL (ref 4.0–10.5)

## 2013-03-29 MED ORDER — POLYETHYLENE GLYCOL 3350 17 G PO PACK
17.0000 g | PACK | Freq: Every day | ORAL | Status: DC
  Filled 2013-03-29: qty 1

## 2013-03-29 MED ORDER — HYDROMORPHONE HCL 2 MG PO TABS: 2.0000 mg | ORAL_TABLET | Freq: Three times a day (TID) | ORAL | Status: DC | PRN

## 2013-03-29 MED ORDER — DOCUSATE SODIUM 100 MG PO CAPS
200.0000 mg | ORAL_CAPSULE | Freq: Two times a day (BID) | ORAL | Status: DC
Start: 2013-03-29 — End: 2013-03-29
  Filled 2013-03-29 (×2): qty 2

## 2013-03-29 MED ORDER — ONDANSETRON HCL 8 MG PO TABS: 8.0000 mg | ORAL_TABLET | Freq: Three times a day (TID) | ORAL | Status: DC | PRN

## 2013-03-29 MED ORDER — DEXAMETHASONE 4 MG PO TABS: 4.0000 mg | ORAL_TABLET | Freq: Two times a day (BID) | ORAL | Status: DC | PRN

## 2013-03-29 MED ORDER — PROCHLORPERAZINE MALEATE 5 MG PO TABS: 5.0000 mg | ORAL_TABLET | Freq: Three times a day (TID) | ORAL | Status: DC

## 2013-03-29 NOTE — Progress Notes (Signed)
Patient was admitted

## 2013-03-29 NOTE — Progress Notes (Signed)
Patient received discharge instructions with husband and son at bedside. All verbalized understanding without further questions. Patient prescriptions given, follow up appointments discussed and medications discussed. Patient belongings packed. Patient to ambulate downstairs to discharge home.

## 2013-03-29 NOTE — Discharge Summary (Signed)
Physician Discharge Summary  Patient ID: Brooke [name] MRN: [id number] DOB/AGE: 04-07-1950 63 y.o.  Admit date: 03/27/2013 Discharge date: 03/29/2013  Primary Care Physician:  Irven Shelling, MD   Discharge Diagnoses:  [poorly controlled pain, uncontrolled nausea and vomiting, dizzyness, dehydration, metastatic breast cancer  Present on Admission:  . Emesis, persistent  Discharge Medications:    Medication List    STOP taking these medications       morphine 15 MG 12 hr tablet  Commonly known as:  MS CONTIN     morphine 15 MG tablet  Commonly known as:  MSIR      TAKE these medications       ALPRAZolam 1 MG 24 hr tablet  Commonly known as:  XANAX XR  Take 1 mg by mouth. Up to twice daily as needed for anxiety     ASTROGLIDE EX  Apply 1 application topically. As needed (combining with replens)     cholecalciferol 400 UNITS Tabs tablet  Commonly known as:  VITAMIN D  Take 400 Units by mouth daily. Patient reportd the Vitamin E should be vitamin D with same dose     CITRACAL PO  Take 3 tablets by mouth daily.     dexamethasone 4 MG tablet  Commonly known as:  DECADRON  Take 1 tablet (4 mg total) by mouth 2 (two) times daily as needed (as needed for nausea).     diphenhydrAMINE 25 mg capsule  Commonly known as:  BENADRYL  Take 25 mg by mouth at bedtime as needed for sleep.     fulvestrant 250 MG/5ML injection  Commonly known as:  FASLODEX  Inject 250 mg into the muscle every 30 (thirty) days. One injection each buttock over 1-2 minutes. Warm prior to use.     gabapentin 300 MG capsule  Commonly known as:  NEURONTIN  Take 300 mg by mouth at bedtime.     HYDROmorphone 2 MG tablet  Commonly known as:  DILAUDID  Take 1 tablet (2 mg total) by mouth 3 (three) times daily as needed for moderate pain or severe pain.     ketoconazole 2 % cream  Commonly known as:  NIZORAL  Apply 1 application topically daily.     naproxen 500 MG tablet  Commonly  known as:  NAPROSYN  Take 500 mg by mouth 2 (two) times daily as needed for moderate pain.     ondansetron 8 MG tablet  Commonly known as:  ZOFRAN  Take 1 tablet (8 mg total) by mouth every 8 (eight) hours as needed for nausea, vomiting or refractory nausea / vomiting.     prochlorperazine 5 MG tablet  Commonly known as:  COMPAZINE  Take 1 tablet (5 mg total) by mouth 3 (three) times daily before meals.     REPLENS VA  Place vaginally. Combining with estroglide     SYSTANE 0.4-0.3 % Soln  Generic drug:  Polyethyl Glycol-Propyl Glycol  Apply 1 drop to eye 2 (two) times daily as needed (dry eyes).     venlafaxine XR 37.5 MG 24 hr capsule  Commonly known as:  EFFEXOR-XR  TAKE 2 CAPSULES DAILY     ZOMETA IV  Inject into the vein every 30 (thirty) days.         Disposition and Follow-up: to our office for fulvestrant dose, which is overdue-- call for appointment  Significant Diagnostic Studies:  Mr Hip Right W Wo Contrast  03/23/2013   CLINICAL DATA:  Patient with known metastatic  breast cancer. Status post radiation therapy to both hips September, 2014 with temporary alleviation of pain. Pain is returned.  EXAM: MRI OF THE RIGHT HIP WITHOUT AND WITH CONTRAST  TECHNIQUE: Multiplanar, multisequence MR imaging was performed both before and after administration of intravenous contrast.  CONTRAST:  15 mL MULTIHANCE GADOBENATE DIMEGLUMINE 529 MG/ML IV SOLN  COMPARISON:  PET CT scan earlier this same day. CT abdomen and pelvis 12/03/2012.  FINDINGS: Extensive osseous metastatic disease is present. The largest deposits are seen throughout the sacrum and in the posterior iliac wings bilaterally adjacent to the sacrum. Large deposits are also present anterior iliac wings bilaterally. The L4 and L5 vertebral bodies are completely replaced with tumor. There is a large deposit in the anterior right acetabulum. The right superior and inferior pubic rami are completely replaced with tumor. Scattered  small deposits are seen in the femoral necks and proximal diaphysis of each femur. No fracture is identified. No soft tissue tumor deposit is seen.  Pelvic musculature is intact and unremarkable. No notable degenerative change is present about the hips. There is no hip joint effusion. No evidence of bursitis is identified. Imaged intrapelvic contents are unremarkable.  IMPRESSION: The examination is positive for extensive osseous metastatic disease. Given right hip pain, note is made of a large deposit in the anterior wall of the right acetabulum and complete replacement of the right superior and inferior pubic rami by tumors. The L4 and L5 vertebral bodies are completely replaced by tumor and extensive deposits are also present in the sacrum posterior iliac wings bilaterally.  Negative for fracture.   Electronically Signed   By: Inge Rise M.D.   On: 03/23/2013 12:30   Nm Pet Image Restag (ps) Skull Base To Thigh  03/23/2013   CLINICAL DATA:  Subsequent treatment strategy for restaging of metastatic left breast cancer.  EXAM: NUCLEAR MEDICINE PET SKULL BASE TO THIGH  FASTING BLOOD GLUCOSE:  Value: 98 mg/dl  TECHNIQUE: 9.6 mCi F-18 FDG was injected intravenously. Full-ring PET imaging was performed from the skull base to thigh after the radiotracer. CT data was obtained and used for attenuation correction and anatomic localization.  COMPARISON:  CT BIOPSY dated 02/19/2013; NM PET IMAGE RESTAG (PS) SKULL BASE TO THIGH dated 02/02/2013  FINDINGS: NECK  No areas of abnormal hypermetabolism.  CHEST  No areas of abnormal hypermetabolism.  ABDOMEN/PELVIS  No areas of abnormal hypermetabolism.  SKELETON  Redemonstration of extensive osseous metastasis. An index left iliac wing lesion measures a S.U.V. max of 7.1. On the prior exam, this measured a S.U.V. max of 6.5. Image 146 today.  Posterior right iliac lesion measures a S.U.V. max of 6.9 on image 142/series 4. On the prior exam, this measured a S.U.V. max of 6.4.   Other lesions demonstrate a similar slight increase in hypermetabolism (likely attributed to differences in scan technique today as detailed below).  CT IMAGES PERFORMED FOR ATTENUATION CORRECTION  No significant findings within the neck. Status post bilateral mastectomy and left axillary node dissection. Increased sclerosis of some osseous metastasis. Example anterior lateral sixth right rib. This is attributed to interval healing.  As of February 2015, [facility] utilizes time-of-flight PET imaging.This new technology can increase SUV measurements by 16- 20 % over conventional PET technology.  IMPRESSION: 1. Extensive osseous metastasis. Given differences in scan technique, felt to be similar. Apparent slight increase in hypermetabolism is likely attributed to the time-of-flight technique used on the new scanner. Please see above 2. No evidence of  extraosseous metastasis.   Electronically Signed   By: Abigail Miyamoto M.D.   On: 03/23/2013 11:29    Discharge Laboratory Values: Basic Metabolic Panel:  Recent Labs Lab 03/27/13 1719 03/28/13 0346 03/29/13 0350  NA 143 139 141  K 4.3 4.7 3.7  CL 109 107 111  CO2 22 23 18*  GLUCOSE 89 135* 136*  BUN 12 13 10   CREATININE 0.74 0.86 0.72  CALCIUM 8.8 8.7 7.8*   GFR Estimated Creatinine Clearance: 78.4 ml/min (by C-G formula based on Cr of 0.72). Liver Function Tests:  Recent Labs Lab 03/27/13 1719 03/28/13 0346 03/29/13 0350  AST 35 34 49*  ALT 30 32 42*  ALKPHOS 74 73 65  BILITOT <0.2* <0.2* <0.2*  PROT 6.0 5.9* 5.5*  ALBUMIN 3.2* 3.3* 2.9*   No results found for this basename: LIPASE, AMYLASE,  in the last 168 hours No results found for this basename: AMMONIA,  in the last 168 hours Coagulation profile No results found for this basename: INR, PROTIME,  in the last 168 hours  CBC:  Recent Labs Lab 03/27/13 1719 03/28/13 0346 03/29/13 0350  WBC 6.6 6.6 7.0  NEUTROABS 4.6 5.3 5.7  HGB 10.4* 11.1* 9.7*  HCT 31.5*  33.7* 29.0*  MCV 89.2 89.2 88.1  PLT 168 165 165   Cardiac Enzymes: No results found for this basename: CKTOTAL, CKMB, CKMBINDEX, TROPONINI,  in the last 168 hours BNP: No components found with this basename: POCBNP,  CBG:  Recent Labs Lab 03/23/13 0744  GLUCAP 98   D-Dimer No results found for this basename: DDIMER,  in the last 72 hours Hgb A1c No results found for this basename: HGBA1C,  in the last 72 hours Lipid Profile No results found for this basename: CHOL, HDL, LDLCALC, TRIG, CHOLHDL, LDLDIRECT,  in the last 72 hours Thyroid function studies No results found for this basename: TSH, T4TOTAL, FREET3, T3FREE, THYROIDAB,  in the last 72 hours Anemia work up No results found for this basename: VITAMINB12, FOLATE, FERRITIN, TIBC, IRON, RETICCTPCT,  in the last 72 hours Sepsis Labs No components found with this basename: PROCALCITONIN,  WBC,  LACTICIDVEN,  Microbiology No results found for this or any previous visit (from the past 240 hour(s)).   Brief H and P: For complete details please refer to admission H and P, but in brief, the patient was admitted with uncontrolled nausea and vomiting due to her narcotics; pain was poorly controlled. For further details see hospital course  Physical Exam at Discharge: BP 149/81  Pulse 69  Temp(Src) 97.9 F (36.6 C) (Oral)  Resp 16  Ht 5' 4"  (1.626 m)  Wt 185 lb (83.915 kg)  BMI 31.74 kg/m2  SpO2 97%  LMP 01/23/2007 Gen: middle aged white woman who is ambulating w/ minimal pain Cardiovascular: RRR Respiratory:no rales or rhonchi Gastrointestinal:+BS, NT Extremities:no peripheral edema    Hospital Course:  Active Problems:   Emesis, persistent  62 y.o. 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 She had a residual 1.5 cm tumor, and one of 8 sampled lymph nodes was positive [ypT1c ypN1]. Margins were negative.  (4) She completed post-mastectomy  radiation in August of 2011  (5) on letrozole between August 2011 and October 2014  (6) PET scan 11/05/2012 obtained to evaluate right chest wall pain documents bony metastatic disease to the right seventh rib, L1, and the right acetabulum with an associated pathologic fracture through the anterior cortex of the acetabulum measuring 3.2 cm. There was no evidence of extra osseous metastatic disease.  (7) as of 11/05/2012, receiving fulvestrant injections, 500 mg monthly, in addition to monthly infusions of zoledronic acid.  (8) Right rib, left hip and right hip were all treated to 37.5 Gy in 15 fractions at 2.5 Gy per fraction completed 12/17/2012  (9) 02/19/2013 bone marrow biopsy confirms metastatic disease to bone. HER-2 was not done because of concerns regarding because of fine the specimen. Estrogen and progesterone receptors were read as negative. This is discordant.   The patient was hydrated and her medications were changed to IV. With dexamethasone, ondansetron and compazine on board her nausea was generally well controlled. Her dose of morphine was decreased but even so it cause the patient to feel nauseated and dizzy. Accordingly the morphine was stopped and she was switched to hydromorphone. She is tolerating this much better, with so far no nausea or dizzyness, and with moderate/good pain control. Other medicines (lidoderm, gabapentin, naproxen) were ineffective or had intolerable side effects. -- Accordingly the plan will be to use hydromorphone 2 mg po TID PRN as the patient's main pain medication, with compazine 5 mg po TIDAC and bowel prophylaxis. The patient has a good  understanding of this plan and agrees  Diet:  regular  Activity:  No rextrictions  Condition at Discharge:   improved  Signed: Dr. Lurline Del [phone]  03/29/2013, 8:34 AM

## 2013-03-30 ENCOUNTER — Ambulatory Visit: Payer: Medicare Other | Admitting: Obstetrics & Gynecology

## 2013-03-30 ENCOUNTER — Telehealth: Payer: Self-pay | Admitting: Oncology

## 2013-03-30 ENCOUNTER — Ambulatory Visit: Payer: Medicare Other

## 2013-03-30 ENCOUNTER — Other Ambulatory Visit: Payer: Self-pay | Admitting: Oncology

## 2013-03-30 ENCOUNTER — Telehealth: Payer: Self-pay | Admitting: Obstetrics & Gynecology

## 2013-03-30 ENCOUNTER — Telehealth: Payer: Self-pay | Admitting: *Deleted

## 2013-03-30 ENCOUNTER — Ambulatory Visit (HOSPITAL_BASED_OUTPATIENT_CLINIC_OR_DEPARTMENT_OTHER): Payer: Medicare Other

## 2013-03-30 VITALS — BP 152/66 | HR 85 | Temp 97.9°F | Resp 18

## 2013-03-30 DIAGNOSIS — Z5111 Encounter for antineoplastic chemotherapy: Secondary | ICD-10-CM

## 2013-03-30 DIAGNOSIS — Z853 Personal history of malignant neoplasm of breast: Secondary | ICD-10-CM

## 2013-03-30 DIAGNOSIS — C50512 Malignant neoplasm of lower-outer quadrant of left female breast: Secondary | ICD-10-CM

## 2013-03-30 DIAGNOSIS — C50519 Malignant neoplasm of lower-outer quadrant of unspecified female breast: Secondary | ICD-10-CM

## 2013-03-30 DIAGNOSIS — C7951 Secondary malignant neoplasm of bone: Secondary | ICD-10-CM

## 2013-03-30 DIAGNOSIS — C7952 Secondary malignant neoplasm of bone marrow: Secondary | ICD-10-CM

## 2013-03-30 DIAGNOSIS — C50919 Malignant neoplasm of unspecified site of unspecified female breast: Secondary | ICD-10-CM

## 2013-03-30 MED ORDER — ZOLEDRONIC ACID 4 MG/100ML IV SOLN
4.0000 mg | Freq: Once | INTRAVENOUS | Status: AC
  Administered 2013-03-30: 4 mg via INTRAVENOUS
  Filled 2013-03-30: qty 100

## 2013-03-30 MED ORDER — FULVESTRANT 250 MG/5ML IM SOLN
500.0000 mg | Freq: Once | INTRAMUSCULAR | Status: AC
  Administered 2013-03-30: 500 mg via INTRAMUSCULAR
  Filled 2013-03-30: qty 10

## 2013-03-30 NOTE — Telephone Encounter (Signed)
Return call to patient, she states she is having a hrd time with some new medication and became dehydrated over the weekend.  Has just today been discharged from hospital and feels exhausted.  State she "just cant do another thing".  Support given, LEEP rescheduled for 04-16-13 as first available date that worked with patient's schedule. Encouraged to call PRN.  Routing to provider for final review. Patient agreeable to disposition. Will close encounter

## 2013-03-30 NOTE — Patient Instructions (Signed)
Zoledronic Acid injection (Hypercalcemia, Oncology) What is this medicine? ZOLEDRONIC ACID (ZOE le dron ik AS id) lowers the amount of calcium loss from bone. It is used to treat too much calcium in your blood from cancer. It is also used to prevent complications of cancer that has spread to the bone. This medicine may be used for other purposes; ask your health care provider or pharmacist if you have questions. COMMON BRAND NAME(S): Zometa What should I tell my health care provider before I take this medicine? They need to know if you have any of these conditions: -aspirin-sensitive asthma -cancer, especially if you are receiving medicines used to treat cancer -dental disease or wear dentures -infection -kidney disease -receiving corticosteroids like dexamethasone or prednisone -an unusual or allergic reaction to zoledronic acid, other medicines, foods, dyes, or preservatives -pregnant or trying to get pregnant -breast-feeding How should I use this medicine? This medicine is for infusion into a vein. It is given by a health care professional in a hospital or clinic setting. Talk to your pediatrician regarding the use of this medicine in children. Special care may be needed. Overdosage: If you think you have taken too much of this medicine contact a poison control center or emergency room at once. NOTE: This medicine is only for you. Do not share this medicine with others. What if I miss a dose? It is important not to miss your dose. Call your doctor or health care professional if you are unable to keep an appointment. What may interact with this medicine? -certain antibiotics given by injection -NSAIDs, medicines for pain and inflammation, like ibuprofen or naproxen -some diuretics like bumetanide, furosemide -teriparatide -thalidomide This list may not describe all possible interactions. Give your health care provider a list of all the medicines, herbs, non-prescription drugs, or  dietary supplements you use. Also tell them if you smoke, drink alcohol, or use illegal drugs. Some items may interact with your medicine. What should I watch for while using this medicine? Visit your doctor or health care professional for regular checkups. It may be some time before you see the benefit from this medicine. Do not stop taking your medicine unless your doctor tells you to. Your doctor may order blood tests or other tests to see how you are doing. Women should inform their doctor if they wish to become pregnant or think they might be pregnant. There is a potential for serious side effects to an unborn child. Talk to your health care professional or pharmacist for more information. You should make sure that you get enough calcium and vitamin D while you are taking this medicine. Discuss the foods you eat and the vitamins you take with your health care professional. Some people who take this medicine have severe bone, joint, and/or muscle pain. This medicine may also increase your risk for jaw problems or a broken thigh bone. Tell your doctor right away if you have severe pain in your jaw, bones, joints, or muscles. Tell your doctor if you have any pain that does not go away or that gets worse. Tell your dentist and dental surgeon that you are taking this medicine. You should not have major dental surgery while on this medicine. See your dentist to have a dental exam and fix any dental problems before starting this medicine. Take good care of your teeth while on this medicine. Make sure you see your dentist for regular follow-up appointments. What side effects may I notice from receiving this medicine? Side effects that   you should report to your doctor or health care professional as soon as possible: -allergic reactions like skin rash, itching or hives, swelling of the face, lips, or tongue -anxiety, confusion, or depression -breathing problems -changes in vision -eye pain -feeling faint or  lightheaded, falls -jaw pain, especially after dental work -mouth sores -muscle cramps, stiffness, or weakness -trouble passing urine or change in the amount of urine Side effects that usually do not require medical attention (report to your doctor or health care professional if they continue or are bothersome): -bone, joint, or muscle pain -constipation -diarrhea -fever -hair loss -irritation at site where injected -loss of appetite -nausea, vomiting -stomach upset -trouble sleeping -trouble swallowing -weak or tired This list may not describe all possible side effects. Call your doctor for medical advice about side effects. You may report side effects to FDA at 1-800-FDA-1088. Where should I keep my medicine? This drug is given in a hospital or clinic and will not be stored at home. NOTE: This sheet is a summary. It may not cover all possible information. If you have questions about this medicine, talk to your doctor, pharmacist, or health care provider.  2014, Elsevier/Gold Standard. (2012-06-19 13:03:13)   Fulvestrant injection What is this medicine? FULVESTRANT (ful VES trant) blocks the effects of estrogen. It is used to treat breast cancer in women past the age of menopause. This medicine may be used for other purposes; ask your health care provider or pharmacist if you have questions. COMMON BRAND NAME(S): FASLODEX What should I tell my health care provider before I take this medicine? They need to know if you have any of these conditions: -bleeding problems -liver disease -low levels of platelets in the blood -an unusual or allergic reaction to fulvestrant, other medicines, foods, dyes, or preservatives -pregnant or trying to get pregnant -breast-feeding How should I use this medicine? This medicine is for injection into a muscle. It is usually given by a health care professional in a hospital or clinic setting. Talk to your pediatrician regarding the use of this  medicine in children. Special care may be needed. Overdosage: If you think you have taken too much of this medicine contact a poison control center or emergency room at once. NOTE: This medicine is only for you. Do not share this medicine with others. What if I miss a dose? It is important not to miss your dose. Call your doctor or health care professional if you are unable to keep an appointment. What may interact with this medicine? -medicines that treat or prevent blood clots like warfarin, enoxaparin, and dalteparin This list may not describe all possible interactions. Give your health care provider a list of all the medicines, herbs, non-prescription drugs, or dietary supplements you use. Also tell them if you smoke, drink alcohol, or use illegal drugs. Some items may interact with your medicine. What should I watch for while using this medicine? Your condition will be monitored carefully while you are receiving this medicine. You will need important blood work done while you are taking this medicine. Do not become pregnant while taking this medicine. Women should inform their doctor if they wish to become pregnant or think they might be pregnant. There is a potential for serious side effects to an unborn child. Talk to your health care professional or pharmacist for more information. What side effects may I notice from receiving this medicine? Side effects that you should report to your doctor or health care professional as soon as possible: -allergic   reactions like skin rash, itching or hives, swelling of the face, lips, or tongue -feeling faint or lightheaded, falls -fever or flu-like symptoms -sore throat -vaginal bleeding Side effects that usually do not require medical attention (report to your doctor or health care professional if they continue or are bothersome): -aches, pains -constipation or diarrhea -headache -hot flashes -nausea, vomiting -pain at site where  injected -stomach pain This list may not describe all possible side effects. Call your doctor for medical advice about side effects. You may report side effects to FDA at 1-800-FDA-1088. Where should I keep my medicine? This drug is given in a hospital or clinic and will not be stored at home. NOTE: This sheet is a summary. It may not cover all possible information. If you have questions about this medicine, talk to your doctor, pharmacist, or health care provider.  2014, Elsevier/Gold Standard. (2007-05-19 15:39:24)  

## 2013-03-30 NOTE — Telephone Encounter (Signed)
Patient's husband "[name]" calling to reschedule his wife's Leep appointment with Dr. Sabra Heck. Brooke [name] just returned home from the hospital and is dehydrated. [name] would like to reschedule to later this week or early next week.

## 2013-03-30 NOTE — Telephone Encounter (Signed)
Per staff message and POF I have scheduled appts.  JMW  

## 2013-03-30 NOTE — Telephone Encounter (Signed)
m °

## 2013-03-31 ENCOUNTER — Ambulatory Visit: Payer: Medicare Other | Admitting: Obstetrics & Gynecology

## 2013-04-01 ENCOUNTER — Other Ambulatory Visit: Payer: Self-pay | Admitting: Oncology

## 2013-04-02 ENCOUNTER — Other Ambulatory Visit: Payer: Self-pay | Admitting: Oncology

## 2013-04-02 ENCOUNTER — Inpatient Hospital Stay
Admission: RE | Admit: 2013-04-02 | Discharge: 2013-04-02 | Disposition: A | Discharge status: Home / Self Care | Payer: Self-pay | Source: Ambulatory Visit | Attending: Oncology | Admitting: Oncology

## 2013-04-02 DIAGNOSIS — C50919 Malignant neoplasm of unspecified site of unspecified female breast: Secondary | ICD-10-CM

## 2013-04-03 ENCOUNTER — Other Ambulatory Visit: Payer: Self-pay | Admitting: Oncology

## 2013-04-03 NOTE — Progress Notes (Unsigned)
I reviewed the October hip MRI obtained through Dr.Aluisio's office with the more recent MRI obtained March to 20/50) the outside film has been uploaded into the system). Radiology feels there is a clear improvement and particularly the significant amount of edema and marrow involvement seen before is now not seen. There is less bony destruction apparent in the current film. Of course the patient has had radiation to this area, and one would expect some improvement. However what we were concerned about was whether there was any worsening. I have called pad and given her this news. The plan accordingly is to continue on the fulvestrant as before.

## 2013-04-15 ENCOUNTER — Telehealth: Payer: Self-pay | Admitting: *Deleted

## 2013-04-15 NOTE — Telephone Encounter (Signed)
Due to office schedule conflict, call to patient to move LEEP procedure to an earlier or later time on same day.  Patient refers to move to Friday at noon, 04-17-13.    Patient is aware to take Naproxen prior to procedure but since she wants to know if she should just take Dilaudid, that she has from oncologist instead.  Advised that the anti-inflammatory property of the Naproxen is why it is recommended but if she needs her Dilaudid for other issues, will certainly help with discomfort from procedure as well.  She usually takes this approximately 9am. Should she take both or hold off on Dilaudid?  She has not signed consent.

## 2013-04-15 NOTE — Telephone Encounter (Signed)
Patient notified ok to take both meds.

## 2013-04-15 NOTE — Telephone Encounter (Signed)
Both are fine.  Encounter closed.

## 2013-04-16 ENCOUNTER — Ambulatory Visit: Payer: Medicare Other | Admitting: Obstetrics & Gynecology

## 2013-04-17 ENCOUNTER — Ambulatory Visit (INDEPENDENT_AMBULATORY_CARE_PROVIDER_SITE_OTHER): Payer: Medicare Other | Admitting: Obstetrics & Gynecology

## 2013-04-17 VITALS — BP 126/78 | HR 64 | Resp 16 | Wt 182.6 lb

## 2013-04-17 DIAGNOSIS — N766 Ulceration of vulva: Secondary | ICD-10-CM

## 2013-04-17 DIAGNOSIS — N870 Mild cervical dysplasia: Secondary | ICD-10-CM

## 2013-04-17 DIAGNOSIS — R6889 Other general symptoms and signs: Secondary | ICD-10-CM

## 2013-04-17 DIAGNOSIS — IMO0002 Reserved for concepts with insufficient information to code with codable children: Secondary | ICD-10-CM

## 2013-04-17 NOTE — Progress Notes (Signed)
Patient ID: Brooke [name], female   DOB: [birth year], 63 y.o.   MRN: [id number]  63 y.o. Married G1P1 here for LEEP.    Pap History: neg Pap with + HR HPV on Pap done February, 2015.  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  Pt with active breast cancer.  Very anxious about any new cancers.  Desires definitive treatment.  We have discussed hysterectomy which I feel is too aggressive.    Patient's last menstrual period was 01/23/2007.  Pre-procedure vitals: Blood pressure 126/78, pulse 64, resp. rate 16, weight 182 lb 9.6 oz (82.827 kg), last menstrual period 01/23/2007.   Procedure explained and patient's questions were invited and answered.   Consent form signed.  Pre-procedure medication:  naprosyn and dilaudid.  Time given:  11:00am  Procedure Set-up: Grounding pad located left thigh.  Cautery settings: 50 cut/50coagulation.  Suction applied to coated speculum.  Procedure:  Speculum placed with good visualization of the cervix.  Colposcopy performed showing:  no visible lesions.  Cervix anesthetized using 2% Xylocaine with 1:100,000units Epinephrine.  10 cc's used.Entire transition zone with 10 x 88mm loop in 1passes.  Additional specimen at 3, 6, 9, and 12 o'clock positions.  Specimen(s) placed on cork and labeled for pathology.  Hemostasis obtained with ball cautery and Monsel's solution.  EBL:  Minimal  Complications:none  Patient tolerated procedure well and left the office in satisfactory condition.  Patient also desires a biopsy of what is most likely a herpetic lesion on the vulva.  Verbal consent obtained.  Area cleansed with Betadine x 3.  4cc 1% lidocaine instilled.  50mm punch biopsy obtained. Excised with sterile scissors.  Silver nitrate used for hemostasis.  Plan:  Await pathology.  Plan repeat pap planned in 6 months. Vulvar pathology pending.

## 2013-04-21 LAB — IPS OTHER TISSUE BIOPSY

## 2013-04-23 ENCOUNTER — Telehealth: Payer: Self-pay | Admitting: Obstetrics & Gynecology

## 2013-04-23 MED ORDER — CLOBETASOL PROPIONATE 0.05 % EX CREA: 1.0000 "application " | TOPICAL_CREAM | Freq: Two times a day (BID) | CUTANEOUS | Status: DC

## 2013-04-23 NOTE — Telephone Encounter (Signed)
Reviewed with Dr Sabra Heck, Ketoconazole is an antifungal and will not take the place of Clobetasol.  Patient notified and advised that med sent to pharmacy and she may begin using now.  Patient asks if she can wait and discuss with Dr Ron Agee before starting medication.  Advised that it is fine to wait and again, does not even have to treat it unless area is bothersome or itching.  Patient states she will look it up, discuss with Dr Ron Agee and then decide if wants to pick it up.  Advised Dr Sabra Heck recommends recheck in one month if begins medication.  6 month pap is scheduled and 06 recall for 11-05-13  Routing to provider for final review. Patient agreeable to disposition. Will close encounter

## 2013-04-23 NOTE — Telephone Encounter (Signed)
Patient is asking to talk to Gay Filler regarding her recent results.

## 2013-04-23 NOTE — Telephone Encounter (Signed)
Message copied by Jaymes Graff on Thu Apr 23, 2013  2:31 PM ------      Message from: Megan Salon      Created: Tue Apr 21, 2013 11:16 AM       Please let pt know LEEP specimen showed CIN 1 only.  Margins weren't clear but there is cautery effect at the edges so that doesn't mean it wasn't completely treated.  Will plan repeat Pap 6 months.  06 recall.  Not release to MyChart but I can if she wants it after she is called.  Thanks. ------

## 2013-04-23 NOTE — Telephone Encounter (Signed)
Notified of LEEP results a directed by Dr Sabra Heck. 6 month follow up pap scheduled for 10-22-13.  Also notified of vulvar biopsy result of Lichen Sclerosis per Dr Sabra Heck and can treat area with steroid cream if area is itching or bothersome.  Patient states area is bothersome and she would like to proceed with treament.  Wants to know if she can use Ketoconazole cream that she already has instead of new RX.  She request I call her back and let her know.

## 2013-04-27 ENCOUNTER — Ambulatory Visit (HOSPITAL_BASED_OUTPATIENT_CLINIC_OR_DEPARTMENT_OTHER): Payer: Medicare Other

## 2013-04-27 ENCOUNTER — Other Ambulatory Visit: Payer: Self-pay | Admitting: *Deleted

## 2013-04-27 ENCOUNTER — Other Ambulatory Visit: Payer: Self-pay | Admitting: Oncology

## 2013-04-27 ENCOUNTER — Other Ambulatory Visit (HOSPITAL_BASED_OUTPATIENT_CLINIC_OR_DEPARTMENT_OTHER): Payer: Medicare Other

## 2013-04-27 ENCOUNTER — Ambulatory Visit: Payer: Medicare Other

## 2013-04-27 VITALS — BP 112/76 | HR 90 | Temp 97.2°F | Resp 18

## 2013-04-27 DIAGNOSIS — C50919 Malignant neoplasm of unspecified site of unspecified female breast: Secondary | ICD-10-CM

## 2013-04-27 DIAGNOSIS — C7952 Secondary malignant neoplasm of bone marrow: Secondary | ICD-10-CM

## 2013-04-27 DIAGNOSIS — C7951 Secondary malignant neoplasm of bone: Principal | ICD-10-CM

## 2013-04-27 DIAGNOSIS — Z5111 Encounter for antineoplastic chemotherapy: Secondary | ICD-10-CM

## 2013-04-27 DIAGNOSIS — C50512 Malignant neoplasm of lower-outer quadrant of left female breast: Secondary | ICD-10-CM

## 2013-04-27 DIAGNOSIS — C50519 Malignant neoplasm of lower-outer quadrant of unspecified female breast: Secondary | ICD-10-CM

## 2013-04-27 LAB — CBC & DIFF AND RETIC
BASO%: 0.3 % (ref 0.0–2.0)
Basophils Absolute: 0 10*3/uL (ref 0.0–0.1)
EOS%: 2.1 % (ref 0.0–7.0)
Eosinophils Absolute: 0.1 10*3/uL (ref 0.0–0.5)
HEMATOCRIT: 35.6 % (ref 34.8–46.6)
HGB: 12.1 g/dL (ref 11.6–15.9)
IMMATURE RETIC FRACT: 7.1 % (ref 1.60–10.00)
LYMPH#: 1.4 10*3/uL (ref 0.9–3.3)
LYMPH%: 22.6 % (ref 14.0–49.7)
MCH: 29.1 pg (ref 25.1–34.0)
MCHC: 34 g/dL (ref 31.5–36.0)
MCV: 85.6 fL (ref 79.5–101.0)
MONO#: 0.9 10*3/uL (ref 0.1–0.9)
MONO%: 14.9 % — ABNORMAL HIGH (ref 0.0–14.0)
NEUT#: 3.6 10*3/uL (ref 1.5–6.5)
NEUT%: 60.1 % (ref 38.4–76.8)
PLATELETS: 208 10*3/uL (ref 145–400)
RBC: 4.16 10*6/uL (ref 3.70–5.45)
RDW: 12.4 % (ref 11.2–14.5)
RETIC CT ABS: 67.81 10*3/uL (ref 33.70–90.70)
Retic %: 1.63 % (ref 0.70–2.10)
WBC: 6.1 10*3/uL (ref 3.9–10.3)
nRBC: 0 % (ref 0–0)

## 2013-04-27 LAB — COMPREHENSIVE METABOLIC PANEL (CC13)
ALT: 117 U/L — ABNORMAL HIGH (ref 0–55)
ANION GAP: 10 meq/L (ref 3–11)
AST: 75 U/L — AB (ref 5–34)
Albumin: 3.6 g/dL (ref 3.5–5.0)
Alkaline Phosphatase: 105 U/L (ref 40–150)
BILIRUBIN TOTAL: 0.35 mg/dL (ref 0.20–1.20)
BUN: 17.3 mg/dL (ref 7.0–26.0)
CHLORIDE: 104 meq/L (ref 98–109)
CO2: 24 meq/L (ref 22–29)
Calcium: 10.1 mg/dL (ref 8.4–10.4)
Creatinine: 0.9 mg/dL (ref 0.6–1.1)
Glucose: 120 mg/dl (ref 70–140)
Potassium: 4 mEq/L (ref 3.5–5.1)
SODIUM: 137 meq/L (ref 136–145)
TOTAL PROTEIN: 6.8 g/dL (ref 6.4–8.3)

## 2013-04-27 MED ORDER — ZOLEDRONIC ACID 4 MG/100ML IV SOLN
4.0000 mg | Freq: Once | INTRAVENOUS | Status: AC
  Administered 2013-04-27: 4 mg via INTRAVENOUS
  Filled 2013-04-27: qty 100

## 2013-04-27 MED ORDER — SODIUM CHLORIDE 0.9 % IV SOLN
INTRAVENOUS | Status: DC
  Administered 2013-04-27: 14:00:00 via INTRAVENOUS

## 2013-04-27 MED ORDER — FULVESTRANT 250 MG/5ML IM SOLN
500.0000 mg | Freq: Once | INTRAMUSCULAR | Status: AC
  Administered 2013-04-27: 500 mg via INTRAMUSCULAR
  Filled 2013-04-27: qty 10

## 2013-04-27 NOTE — Progress Notes (Signed)
Dr. Jana Hakim aware of Cmet results. OK to proceed with Zometa.

## 2013-04-27 NOTE — Patient Instructions (Signed)
Zoledronic Acid injection (Hypercalcemia, Oncology) What is this medicine? ZOLEDRONIC ACID (ZOE le dron ik AS id) lowers the amount of calcium loss from bone. It is used to treat too much calcium in your blood from cancer. It is also used to prevent complications of cancer that has spread to the bone. This medicine may be used for other purposes; ask your health care provider or pharmacist if you have questions. COMMON BRAND NAME(S): Zometa What should I tell my health care provider before I take this medicine? They need to know if you have any of these conditions: -aspirin-sensitive asthma -cancer, especially if you are receiving medicines used to treat cancer -dental disease or wear dentures -infection -kidney disease -receiving corticosteroids like dexamethasone or prednisone -an unusual or allergic reaction to zoledronic acid, other medicines, foods, dyes, or preservatives -pregnant or trying to get pregnant -breast-feeding How should I use this medicine? This medicine is for infusion into a vein. It is given by a health care professional in a hospital or clinic setting. Talk to your pediatrician regarding the use of this medicine in children. Special care may be needed. Overdosage: If you think you have taken too much of this medicine contact a poison control center or emergency room at once. NOTE: This medicine is only for you. Do not share this medicine with others. What if I miss a dose? It is important not to miss your dose. Call your doctor or health care professional if you are unable to keep an appointment. What may interact with this medicine? -certain antibiotics given by injection -NSAIDs, medicines for pain and inflammation, like ibuprofen or naproxen -some diuretics like bumetanide, furosemide -teriparatide -thalidomide This list may not describe all possible interactions. Give your health care provider a list of all the medicines, herbs, non-prescription drugs, or  dietary supplements you use. Also tell them if you smoke, drink alcohol, or use illegal drugs. Some items may interact with your medicine. What should I watch for while using this medicine? Visit your doctor or health care professional for regular checkups. It may be some time before you see the benefit from this medicine. Do not stop taking your medicine unless your doctor tells you to. Your doctor may order blood tests or other tests to see how you are doing. Women should inform their doctor if they wish to become pregnant or think they might be pregnant. There is a potential for serious side effects to an unborn child. Talk to your health care professional or pharmacist for more information. You should make sure that you get enough calcium and vitamin D while you are taking this medicine. Discuss the foods you eat and the vitamins you take with your health care professional. Some people who take this medicine have severe bone, joint, and/or muscle pain. This medicine may also increase your risk for jaw problems or a broken thigh bone. Tell your doctor right away if you have severe pain in your jaw, bones, joints, or muscles. Tell your doctor if you have any pain that does not go away or that gets worse. Tell your dentist and dental surgeon that you are taking this medicine. You should not have major dental surgery while on this medicine. See your dentist to have a dental exam and fix any dental problems before starting this medicine. Take good care of your teeth while on this medicine. Make sure you see your dentist for regular follow-up appointments. What side effects may I notice from receiving this medicine? Side effects that   you should report to your doctor or health care professional as soon as possible: -allergic reactions like skin rash, itching or hives, swelling of the face, lips, or tongue -anxiety, confusion, or depression -breathing problems -changes in vision -eye pain -feeling faint or  lightheaded, falls -jaw pain, especially after dental work -mouth sores -muscle cramps, stiffness, or weakness -trouble passing urine or change in the amount of urine Side effects that usually do not require medical attention (report to your doctor or health care professional if they continue or are bothersome): -bone, joint, or muscle pain -constipation -diarrhea -fever -hair loss -irritation at site where injected -loss of appetite -nausea, vomiting -stomach upset -trouble sleeping -trouble swallowing -weak or tired This list may not describe all possible side effects. Call your doctor for medical advice about side effects. You may report side effects to FDA at 1-800-FDA-1088. Where should I keep my medicine? This drug is given in a hospital or clinic and will not be stored at home. NOTE: This sheet is a summary. It may not cover all possible information. If you have questions about this medicine, talk to your doctor, pharmacist, or health care provider.  2014, Elsevier/Gold Standard. (2012-06-19 13:03:13)   Fulvestrant injection What is this medicine? FULVESTRANT (ful VES trant) blocks the effects of estrogen. It is used to treat breast cancer in women past the age of menopause. This medicine may be used for other purposes; ask your health care provider or pharmacist if you have questions. COMMON BRAND NAME(S): FASLODEX What should I tell my health care provider before I take this medicine? They need to know if you have any of these conditions: -bleeding problems -liver disease -low levels of platelets in the blood -an unusual or allergic reaction to fulvestrant, other medicines, foods, dyes, or preservatives -pregnant or trying to get pregnant -breast-feeding How should I use this medicine? This medicine is for injection into a muscle. It is usually given by a health care professional in a hospital or clinic setting. Talk to your pediatrician regarding the use of this  medicine in children. Special care may be needed. Overdosage: If you think you have taken too much of this medicine contact a poison control center or emergency room at once. NOTE: This medicine is only for you. Do not share this medicine with others. What if I miss a dose? It is important not to miss your dose. Call your doctor or health care professional if you are unable to keep an appointment. What may interact with this medicine? -medicines that treat or prevent blood clots like warfarin, enoxaparin, and dalteparin This list may not describe all possible interactions. Give your health care provider a list of all the medicines, herbs, non-prescription drugs, or dietary supplements you use. Also tell them if you smoke, drink alcohol, or use illegal drugs. Some items may interact with your medicine. What should I watch for while using this medicine? Your condition will be monitored carefully while you are receiving this medicine. You will need important blood work done while you are taking this medicine. Do not become pregnant while taking this medicine. Women should inform their doctor if they wish to become pregnant or think they might be pregnant. There is a potential for serious side effects to an unborn child. Talk to your health care professional or pharmacist for more information. What side effects may I notice from receiving this medicine? Side effects that you should report to your doctor or health care professional as soon as possible: -allergic   reactions like skin rash, itching or hives, swelling of the face, lips, or tongue -feeling faint or lightheaded, falls -fever or flu-like symptoms -sore throat -vaginal bleeding Side effects that usually do not require medical attention (report to your doctor or health care professional if they continue or are bothersome): -aches, pains -constipation or diarrhea -headache -hot flashes -nausea, vomiting -pain at site where  injected -stomach pain This list may not describe all possible side effects. Call your doctor for medical advice about side effects. You may report side effects to FDA at 1-800-FDA-1088. Where should I keep my medicine? This drug is given in a hospital or clinic and will not be stored at home. NOTE: This sheet is a summary. It may not cover all possible information. If you have questions about this medicine, talk to your doctor, pharmacist, or health care provider.  2014, Elsevier/Gold Standard. (2007-05-19 15:39:24)  

## 2013-04-28 ENCOUNTER — Telehealth: Payer: Self-pay | Admitting: Oncology

## 2013-04-28 ENCOUNTER — Other Ambulatory Visit: Payer: Self-pay | Admitting: *Deleted

## 2013-04-28 LAB — CANCER ANTIGEN 27.29: CA 27.29: 363 U/mL — ABNORMAL HIGH (ref 0–39)

## 2013-04-28 LAB — CEA: CEA: 81.4 ng/mL — ABNORMAL HIGH (ref 0.0–5.0)

## 2013-04-28 NOTE — Telephone Encounter (Signed)
, °

## 2013-04-30 ENCOUNTER — Other Ambulatory Visit: Payer: Medicare Other

## 2013-04-30 ENCOUNTER — Telehealth: Payer: Self-pay | Admitting: Oncology

## 2013-04-30 ENCOUNTER — Ambulatory Visit (HOSPITAL_BASED_OUTPATIENT_CLINIC_OR_DEPARTMENT_OTHER): Payer: Medicare Other | Admitting: Oncology

## 2013-04-30 VITALS — BP 127/75 | HR 88 | Temp 97.9°F | Resp 20 | Ht 64.0 in | Wt 180.1 lb

## 2013-04-30 DIAGNOSIS — C7951 Secondary malignant neoplasm of bone: Secondary | ICD-10-CM

## 2013-04-30 DIAGNOSIS — C50919 Malignant neoplasm of unspecified site of unspecified female breast: Secondary | ICD-10-CM

## 2013-04-30 DIAGNOSIS — R0781 Pleurodynia: Secondary | ICD-10-CM

## 2013-04-30 DIAGNOSIS — M25551 Pain in right hip: Secondary | ICD-10-CM

## 2013-04-30 DIAGNOSIS — M79603 Pain in arm, unspecified: Secondary | ICD-10-CM

## 2013-04-30 DIAGNOSIS — M25559 Pain in unspecified hip: Secondary | ICD-10-CM

## 2013-04-30 DIAGNOSIS — C50519 Malignant neoplasm of lower-outer quadrant of unspecified female breast: Secondary | ICD-10-CM

## 2013-04-30 DIAGNOSIS — C7952 Secondary malignant neoplasm of bone marrow: Secondary | ICD-10-CM

## 2013-04-30 DIAGNOSIS — C50512 Malignant neoplasm of lower-outer quadrant of left female breast: Secondary | ICD-10-CM

## 2013-04-30 LAB — HEPATIC FUNCTION PANEL
ALK PHOS: 91 U/L (ref 39–117)
ALT: 93 U/L — ABNORMAL HIGH (ref 0–35)
AST: 67 U/L — AB (ref 0–37)
Albumin: 3.8 g/dL (ref 3.5–5.2)
BILIRUBIN DIRECT: 0.1 mg/dL (ref 0.0–0.3)
BILIRUBIN TOTAL: 0.4 mg/dL (ref 0.2–1.2)
Indirect Bilirubin: 0.3 mg/dL (ref 0.2–1.2)
Total Protein: 6.4 g/dL (ref 6.0–8.3)

## 2013-04-30 MED ORDER — HYDROMORPHONE HCL 2 MG PO TABS: 2.0000 mg | ORAL_TABLET | Freq: Four times a day (QID) | ORAL | Status: DC | PRN

## 2013-04-30 NOTE — Telephone Encounter (Signed)
[facility] PT APPT SCHEDULE FOR MAY THRU JULY. [facility] WILL CALL RE PET SCAN APPT - PT AWARE.

## 2013-04-30 NOTE — Progress Notes (Signed)
ID: Brooke [name]   DOB: 1950-07-16  MR#: [id number]  CSN#:[id number]  PCP: Irven Shelling, MD GYN: SU:  OTHER MD: Thea Silversmith, Gaynelle Arabian, Shanu Bennington [phone]), Willa Rough DDS  CHIEF COMPLAINT:  "I'm worried about those labs".  BREAST CANCER HISTORY: Brooke [name] had screening mammography in August 04, 2007 which showed no specific mammographic evidence of malignancy, but she reported a left breast lump.  Accordingly, she was brought back on August 11, 2007 for mammography and ultrasonography.  There was indeed an obscured mass in the upper left breast which by ultrasound appeared to be a simple cyst.    Screening mammogram on August 04, 2008 showed in addition to dense breasts, again a possible mass in the left breast.  She had diagnostic mammography August 06, 2008 and additional views in addition to very dense breasts did not show any persistent mass or distortion.  Furthermore, Dr. Owens Shark was not able to palpate any abnormality in the lateral portion of the left breast.  Ultrasound showed normal appearing fibroglandular tissue in the area in question.    More recently, about September, the patient felt again something like a lump in the left breast and she had some pain associated with this.  This was initially felt simply to be mastalgia and was treated as such, but as it persisted, on December 23rd, the patient had left diagnostic mammography and ultrasonography.  There was a focally dense area in the left breast with very minimal distortion corresponding to the area of palpable abnormality. The ultrasound showed an irregular ill-defined, hypoechoic area with distal shadowing, measuring approximately 1 cm corresponding to the patient's palpable abnormality.    With this information, the patient was brought back for ultrasound-guided core biopsy and this was performed December 28th.  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  There was no evidence of angiolymphatic invasion in the material submitted which measured 1.5 cm maximally.  In addition to the mass in the breast, a lymph node in the left axilla was biopsied and was likewise positive.  The tumor was ER positive at 89%, PR positive at 34% with an elevated MIB-1 at 44% and Her-2 negative with a ratio of 1.13.    Her subsequent history is as detailed below.  INTERVAL HISTORY: Brooke [name] returns today with her husband [name] for followup of her metastatic breast cancer. She is 4 days out from her most recent fulvestrant and zolendronate does. She continues to tolerate those medications with no side effects that she is aware of  REVIEW OF SYSTEMS: Brooke [name]'s right hip and rib cage pain is well-controlled on 2 mg of hydromorphone given 3 times a day. She is fully functional at this dose. It does not cause her nausea or sleepiness. It might cause her constipation but she controls it well with MiraLAX and stool softeners. She underwent her LEEP procedure and was prescribed Temovate but has been very resistant to receive it. She was glad that the procedure did not show herpes. She still having a mild accident. Because of that she is now walking as much as she would like. She is having moderate hot flashes. Otherwise a detailed review of systems today was remarkably benign  PAST MEDICAL HISTORY: Past Medical History  Diagnosis Date  . Hx Breast Cancer, IDC, Stahe III, receptor + 10/20/2010    left breast  . Breast cancer December 2010    invasive ductal and invasive lobular; bilateral mastectomy and radiation  .  Mitral valve prolapse   . History of recurrent UTIs   . Radiation 07/26/09-09/08/09    left breast  . Fracture, pelvis closed 11/2012    both hipsand number 7 rib, 15 rounds radiation  1. Remote migraines, currently inactive.   2. History of prior diagnosis of fibromyalgia made at [facility] and currently not active (she was treated with trimethoprim and  nortriptyline for one year for this).    3. History of recurrent UTIs.   4. History of benign left breast cyst aspirated in 2004 at [facility] under Dr. Melene Plan.   5. History of tonsillectomy and adenoidectomy.   6. History of right LASEK surgery. 7. History of bilateral reduction mammoplasties in 1994.   8. History of mitral valve prolapse diagnosed in 1984.  PAST SURGICAL HISTORY: Past Surgical History  Procedure Laterality Date  . Mastectomy Bilateral 2011    left breast cancer  . Lasik    . Reduction mammaplasty Bilateral 1994  . Tonsillectomy and adenoidectomy    . Aspiration of left breast Left 2004    [facility]/Dr.Teo  . Bone biopsy  02/19/13    mets from breast cancer-receptors results not back    FAMILY HISTORY Family History  Problem Relation Age of Onset  . Lung cancer Father 49    smoker for 25 years  . Heart disease Maternal Grandmother   . Heart disease Maternal Grandfather   . Diabetes type I Mother   . Diabetes type I Sister   . Diabetes type I Sister   . Heart disease Maternal Uncle   . Uterine cancer Paternal Grandmother 41  . Breast cancer Other 71    paternal great grandmother  [name] died at the age of 80 from lung cancer.  He had been a remote smoker and had been in [employer] with a question of possible asbestos exposure.  [name] died at the age of 108 in the setting of Alzheimer's, coronary artery disease, diabetes and hypertension.  The patient had two sisters.  One died from complications of diabetes.  The other one is alive with diabetes.    GYNECOLOGIC HISTORY: She is GX P1.  First pregnancy to term at age 16.  The patient's last period was in 2008.  She took hormone replacement for about 18 months until mid-2008.  She had no complications from that treatment.    SOCIAL HISTORY: (Updated October 2014) She used to be a Arts development officer for [employer]  They used to live in the [place] area.  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  Her husband, [name], retired October 2012.  He was a Arts development officer for 28 years.  Their son, [name], is currently with the [employer] at the [employer] in [place]. [name].  She attends [employer].   ADVANCED DIRECTIVES: in place  HEALTH MAINTENANCE: (Updated 11/19/2012) History  Substance Use Topics  . Smoking status: Former Smoker -- 1.00 packs/day for 11 years    Quit date: 01/23/1984  . Smokeless tobacco: Never Used  . Alcohol Use: 1.5 - 2.5 oz/week    3-5 drink(s) per week     Comment: glasses of wine     Colonoscopy: 2010  PAP: Jan 2014  Bone density: July 2014, osteopenia  Lipid panel: per Dr Valentina Lucks  Allergies  Allergen Reactions  . Sulfa Antibiotics Shortness Of Breath  . Codeine  Nausea And Vomiting    Tolerates oxycodone if given with prochlorperazine for nausea  . Morphine And Related Nausea And Vomiting    Per patient possibility that the IR morphine was causing the vomiting    Current Outpatient Prescriptions  Medication Sig Dispense Refill  . ALPRAZolam (XANAX XR) 1 MG 24 hr tablet Take 1 mg by mouth. Up to twice daily as needed for anxiety      . Calcium Citrate (CITRACAL PO) Take 3 tablets by mouth daily.      . cholecalciferol (VITAMIN D) 400 UNITS TABS tablet Take 400 Units by mouth daily. Patient reportd the Vitamin E should be vitamin D with same dose      . clobetasol cream (TEMOVATE) 7.41 % Apply 1 application topically 2 (two) times daily.  30 g  0  . dexamethasone (DECADRON) 4 MG tablet Take 1 tablet (4 mg total) by mouth 2 (two) times daily as needed (as needed for nausea).  30 tablet  3  . diphenhydrAMINE (BENADRYL) 25 mg capsule Take 25 mg by mouth at bedtime as needed for sleep.       [name] Calcium (STOOL SOFTENER PO) Take by mouth daily.      . fulvestrant (FASLODEX) 250 MG/5ML injection Inject 250 mg into the  muscle every 30 (thirty) days. One injection each buttock over 1-2 minutes. Warm prior to use.      . gabapentin (NEURONTIN) 300 MG capsule Take 300 mg by mouth at bedtime.       [name] Kitchen HYDROmorphone (DILAUDID) 2 MG tablet Take 1 tablet (2 mg total) by mouth 3 (three) times daily as needed for moderate pain or severe pain.  90 tablet  0  . ketoconazole (NIZORAL) 2 % cream Apply 1 application topically daily.  15 g  0  . naproxen (NAPROSYN) 500 MG tablet Take 500 mg by mouth 2 (two) times daily as needed for moderate pain.       [name] Kitchen ondansetron (ZOFRAN) 8 MG tablet Take 1 tablet (8 mg total) by mouth every 8 (eight) hours as needed for nausea, vomiting or refractory nausea / vomiting.  20 tablet  3  . Polyethyl Glycol-Propyl Glycol (SYSTANE) 0.4-0.3 % SOLN Apply 1 drop to eye 2 (two) times daily as needed (dry eyes).      . Polyethylene Glycol 3350 (MIRALAX PO) Take by mouth daily.      . prochlorperazine (COMPAZINE) 5 MG tablet Take 1 tablet (5 mg total) by mouth 3 (three) times daily before meals.  90 tablet  3  . Vaginal Lubricant (REPLENS VA) Place vaginally. Combining with estroglide      . valACYclovir (VALTREX) 500 MG tablet Take 500 mg by mouth daily.      [name] Kitchen venlafaxine XR (EFFEXOR-XR) 37.5 MG 24 hr capsule TAKE 2 CAPSULES DAILY  180 capsule  1  . Zoledronic Acid (ZOMETA IV) Inject into the vein every 30 (thirty) days.       No current facility-administered medications for this visit.     Filed Vitals:   04/30/13 1312  BP: 127/75  Pulse: 88  Temp: 97.9 F (36.6 C)  Resp: 20     Body mass index is 30.9 kg/(m^2).    ECOG FS: 1 Filed Weights   04/30/13 1312  Weight: 180 lb 1.6 oz (81.693 kg)   Middle-aged white woman in no acute distress  Sclerae unicteric, pupils equal and reactive Oropharynx clear and moist-- no thrush No cervical or supraclavicular adenopathy Lungs no rales  or rhonchi Heart regular rate and rhythm Abd soft, nontender, positive bowel sounds MSK no focal spinal  tenderness, no upper extremity lymphedema Neuro: nonfocal, well oriented, appropriate affect Breasts: Deferred    LAB RESULTS: Results for Brooke, [name] (MRN [id number]) as of 04/30/2013 13:30  Ref. Range 03/18/2013 09:26 04/27/2013 14:40  CEA Latest Range: 0.0-5.0 ng/mL 51.9 (H) 81.4 (H)   Results for Brooke, [name] (MRN [id number]) as of 04/30/2013 13:30  Ref. Range 05/22/2010 13:32 07/24/2010 13:28 12/12/2010 13:25 01/07/2012 13:47 04/27/2013 14:40  CA 27.29 Latest Range: 0-39 U/mL 15 17 20 21  363 (H)   Results for Brooke, [name] (MRN [id number]) as of 04/30/2013 18:41  Ref. Range 03/27/2013 17:19 03/28/2013 03:46 03/29/2013 03:50 04/27/2013 12:47 04/30/2013 13:01  ALT Latest Range: 0-35 U/L 30 32 42 (H) 117 (H) 93 (H)  Total Protein Latest Range: 6.4-8.3 g/dL 6.0 5.9 (L) 5.5 (L) 6.8 6.4   Lab Results  Component Value Date   WBC 6.1 04/27/2013   NEUTROABS 3.6 04/27/2013   HGB 12.1 04/27/2013   HCT 35.6 04/27/2013   MCV 85.6 04/27/2013   PLT 208 04/27/2013      Chemistry      Component Value Date/Time   NA 137 04/27/2013 1247   NA 141 03/29/2013 0350   NA 142 06/26/2011 0728   K 4.0 04/27/2013 1247   K 3.7 03/29/2013 0350   K 4.6 06/26/2011 0728   CL 111 03/29/2013 0350   CL 105 01/07/2012 1347   CL 102 06/26/2011 0728   CO2 24 04/27/2013 1247   CO2 18* 03/29/2013 0350   CO2 30 06/26/2011 0728   BUN 17.3 04/27/2013 1247   BUN 10 03/29/2013 0350   BUN 12 06/26/2011 0728   CREATININE 0.9 04/27/2013 1247   CREATININE 0.72 03/29/2013 0350   CREATININE 1.1 06/26/2011 0728      Component Value Date/Time   CALCIUM 10.1 04/27/2013 1247   CALCIUM 7.8* 03/29/2013 0350   CALCIUM 9.1 06/26/2011 0728   ALKPHOS 105 04/27/2013 1247   ALKPHOS 65 03/29/2013 0350   AST 75* 04/27/2013 1247   AST 49* 03/29/2013 0350   ALT 117* 04/27/2013 1247   ALT 42* 03/29/2013 0350   BILITOT 0.35 04/27/2013 1247   BILITOT <0.2* 03/29/2013 0350       Lab Results  Component Value Date   LABCA2 363* 04/27/2013    STUDIES: Mr Hip Right W Wo Contrast  03/23/2013    CLINICAL DATA:  Patient with known metastatic breast cancer. Status post radiation therapy to both hips September, 2014 with temporary alleviation of pain. Pain is returned.  EXAM: MRI OF THE RIGHT HIP WITHOUT AND WITH CONTRAST  TECHNIQUE: Multiplanar, multisequence MR imaging was performed both before and after administration of intravenous contrast.  CONTRAST:  15 mL MULTIHANCE GADOBENATE DIMEGLUMINE 529 MG/ML IV SOLN  COMPARISON:  PET CT scan earlier this same day. CT abdomen and pelvis 12/03/2012.  FINDINGS: Extensive osseous metastatic disease is present. The largest deposits are seen throughout the sacrum and in the posterior iliac wings bilaterally adjacent to the sacrum. Large deposits are also present anterior iliac wings bilaterally. The L4 and L5 vertebral bodies are completely replaced with tumor. There is a large deposit in the anterior right acetabulum. The right superior and inferior pubic rami are completely replaced with tumor. Scattered small deposits are seen in the femoral necks and proximal diaphysis of each femur. No fracture is identified. No soft tissue tumor deposit is seen.  Pelvic  musculature is intact and unremarkable. No notable degenerative change is present about the hips. There is no hip joint effusion. No evidence of bursitis is identified. Imaged intrapelvic contents are unremarkable.  IMPRESSION: The examination is positive for extensive osseous metastatic disease. Given right hip pain, note is made of a large deposit in the anterior wall of the right acetabulum and complete replacement of the right superior and inferior pubic rami by tumors. The L4 and L5 vertebral bodies are completely replaced by tumor and extensive deposits are also present in the sacrum posterior iliac wings bilaterally.  Negative for fracture.   Electronically Signed   By: Inge Rise M.D.   On: 03/23/2013 12:30   Nm Pet Image Restag (ps) Skull Base To Thigh  03/23/2013   CLINICAL DATA:  Subsequent  treatment strategy for restaging of metastatic left breast cancer.  EXAM: NUCLEAR MEDICINE PET SKULL BASE TO THIGH  FASTING BLOOD GLUCOSE:  Value: 98 mg/dl  TECHNIQUE: 9.6 mCi F-18 FDG was injected intravenously. Full-ring PET imaging was performed from the skull base to thigh after the radiotracer. CT data was obtained and used for attenuation correction and anatomic localization.  COMPARISON:  CT BIOPSY dated 02/19/2013; NM PET IMAGE RESTAG (PS) SKULL BASE TO THIGH dated 02/02/2013  FINDINGS: NECK  No areas of abnormal hypermetabolism.  CHEST  No areas of abnormal hypermetabolism.  ABDOMEN/PELVIS  No areas of abnormal hypermetabolism.  SKELETON  Redemonstration of extensive osseous metastasis. An index left iliac wing lesion measures a S.U.V. max of 7.1. On the prior exam, this measured a S.U.V. max of 6.5. Image 146 today.  Posterior right iliac lesion measures a S.U.V. max of 6.9 on image 142/series 4. On the prior exam, this measured a S.U.V. max of 6.4.  Other lesions demonstrate a similar slight increase in hypermetabolism (likely attributed to differences in scan technique today as detailed below).  CT IMAGES PERFORMED FOR ATTENUATION CORRECTION  No significant findings within the neck. Status post bilateral mastectomy and left axillary node dissection. Increased sclerosis of some osseous metastasis. Example anterior lateral sixth right rib. This is attributed to interval healing.  As of February 2015, [facility] utilizes time-of-flight PET imaging.This new technology can increase SUV measurements by 16- 20 % over conventional PET technology.  IMPRESSION: 1. Extensive osseous metastasis. Given differences in scan technique, felt to be similar. Apparent slight increase in hypermetabolism is likely attributed to the time-of-flight technique used on the new scanner. Please see above 2. No evidence of extraosseous metastasis.   Electronically Signed   By: Abigail Miyamoto M.D.   On: 03/23/2013 11:29     ASSESSMENT: 63 y.o. [place] woman   (1)  status post Left breast and Left axillary lymph node biopsy Dec 2010 for a cT3 pN1 (stage IIIA) invasive ductal carcinoma, grade 2, estrogen and progesterone receptor positive, HER-2 negative, with an MIB-1 of 44%;   (2)  treated neoadjuvantly with dose dense cyclophosphamide and doxorubicin x4 followed by weekly paclitaxel x7  (3) s/p bilateral mastectomies May 2011. She had a residual 1.5 cm tumor, and one of 8 sampled lymph nodes was positive [ypT1c ypN1]. Margins were negative.   (4)  She completed post-mastectomy radiation in August of 2011   (5) on letrozole between August 2011 and October 2014  (6) PET scan 11/05/2012 obtained to evaluate right chest wall pain documents bony metastatic disease to the right seventh rib, L1, and the right acetabulum with an associated pathologic fracture through the anterior cortex of  the acetabulum measuring 3.2 cm. There was no evidence of extra osseous metastatic disease.  (7)  as of 11/05/2012, receiving fulvestrant injections, 500 mg monthly, in addition to monthly infusions of zoledronic acid.  (8) Right rib, left hip and right hip were all treated to 37.5 Gy in 15 fractions at 2.5 Gy per fraction completed 12/17/2012  (9) 02/19/2013 bone marrow biopsy confirms metastatic disease to bone. HER-2 was not done because of concerns regarding because of fine the specimen. Estrogen and progesterone receptors were read as negative. This is discordant.  (10) patient's tumor carries a PIK3 mutation  (11) pain: controlled on hydromorphone 2 mg po TID; bowel prophylaxis effective   PLAN:  We spent approximately one hour today going over Brooke [name] situations. On the one hand her CEA is a little higher than before. Her CA 27-29 is elevated. She has had a little bit of a bump on her liver function tests, but repeat testing today finds a slight decline, so there is no trend. Clinically, she is very stable. She takes  Dilaudid 3 times a day and is very functional by doing that. She is having regular bowel movements and in fact I think we can slackened up a little in her bowel prophylaxis (perhaps cut back on the stool softeners).  That is very worried that we're going to find that disease progression on the next PET scan. If that happens she understands that she may be able to participate in a study at [facility]. She wondered if there might be studies nearby that might exploit her PI 3K mutation and we discussed referral to [facility] with data  Accordingly she will be seeing of Dr. Albertina Parr at [facility] sometime within the next month so she can discuss what studies may be available for that there. If the PET scan does show progression then she would be that much further ahead. Of course if we do see stable disease or response on the PET scan she still will have established herself at [facility] for future reference.  Her next fulvestrant and zolendronate will be may 05/11/2013. I have scheduled her for a PET scan and visit the following week. We are going to be checking her CEA and CA 27-29 under monthly basis, but I have cautioned patent not to get fixated on these numbers which can be very misleading.  Brooke [name] has a good understanding of the overall plan. She agrees with it. She knows to goal of treatment in her cases cure. She will call with any problems that may develop before next visit here.     Chauncey Cruel, MD 04/30/2013

## 2013-05-04 NOTE — Addendum Note (Signed)
Addended by: Laureen Abrahams on: 05/04/2013 05:59 PM   Modules accepted: Orders, Medications

## 2013-05-05 ENCOUNTER — Telehealth: Payer: Self-pay | Admitting: Oncology

## 2013-05-05 NOTE — Telephone Encounter (Signed)
added additional appts for 4/16, 4/23, 5/7 and 5/14 per 2nd 4/9 pof. due to mychart alert pt's husband called re the add on appts concerned that they are incorrect. checked w/desk nurse and per desk nurse add on appts cx'd. returned call and explained to pt that appts have been cx'd and her schedule is now as it were when she checked out on 4/9. 2nd 4/9 pof disregarded.

## 2013-05-07 ENCOUNTER — Ambulatory Visit: Payer: Medicare Other

## 2013-05-07 ENCOUNTER — Other Ambulatory Visit: Payer: Medicare Other

## 2013-05-08 ENCOUNTER — Other Ambulatory Visit: Payer: Medicare Other | Admitting: Lab

## 2013-05-08 ENCOUNTER — Ambulatory Visit: Payer: Medicare Other

## 2013-05-14 ENCOUNTER — Ambulatory Visit: Payer: Medicare Other

## 2013-05-14 ENCOUNTER — Other Ambulatory Visit: Payer: Medicare Other

## 2013-05-25 ENCOUNTER — Ambulatory Visit (HOSPITAL_BASED_OUTPATIENT_CLINIC_OR_DEPARTMENT_OTHER): Payer: Medicare Other

## 2013-05-25 ENCOUNTER — Other Ambulatory Visit (HOSPITAL_BASED_OUTPATIENT_CLINIC_OR_DEPARTMENT_OTHER): Payer: Medicare Other

## 2013-05-25 ENCOUNTER — Ambulatory Visit: Payer: Medicare Other

## 2013-05-25 VITALS — BP 128/73 | HR 82 | Temp 98.4°F | Resp 18

## 2013-05-25 DIAGNOSIS — Z853 Personal history of malignant neoplasm of breast: Secondary | ICD-10-CM

## 2013-05-25 DIAGNOSIS — C50919 Malignant neoplasm of unspecified site of unspecified female breast: Secondary | ICD-10-CM

## 2013-05-25 DIAGNOSIS — C7952 Secondary malignant neoplasm of bone marrow: Secondary | ICD-10-CM

## 2013-05-25 DIAGNOSIS — C7951 Secondary malignant neoplasm of bone: Secondary | ICD-10-CM

## 2013-05-25 DIAGNOSIS — C50512 Malignant neoplasm of lower-outer quadrant of left female breast: Secondary | ICD-10-CM

## 2013-05-25 DIAGNOSIS — C50519 Malignant neoplasm of lower-outer quadrant of unspecified female breast: Secondary | ICD-10-CM

## 2013-05-25 DIAGNOSIS — Z5111 Encounter for antineoplastic chemotherapy: Secondary | ICD-10-CM

## 2013-05-25 LAB — CBC WITH DIFFERENTIAL/PLATELET
BASO%: 0.5 % (ref 0.0–2.0)
BASOS ABS: 0 10*3/uL (ref 0.0–0.1)
EOS%: 4.1 % (ref 0.0–7.0)
Eosinophils Absolute: 0.2 10*3/uL (ref 0.0–0.5)
HEMATOCRIT: 35.6 % (ref 34.8–46.6)
HGB: 12 g/dL (ref 11.6–15.9)
LYMPH#: 1.4 10*3/uL (ref 0.9–3.3)
LYMPH%: 31.4 % (ref 14.0–49.7)
MCH: 28.8 pg (ref 25.1–34.0)
MCHC: 33.7 g/dL (ref 31.5–36.0)
MCV: 85.4 fL (ref 79.5–101.0)
MONO#: 0.5 10*3/uL (ref 0.1–0.9)
MONO%: 10.5 % (ref 0.0–14.0)
NEUT#: 2.4 10*3/uL (ref 1.5–6.5)
NEUT%: 53.5 % (ref 38.4–76.8)
Platelets: 198 10*3/uL (ref 145–400)
RBC: 4.17 10*6/uL (ref 3.70–5.45)
RDW: 12.7 % (ref 11.2–14.5)
WBC: 4.4 10*3/uL (ref 3.9–10.3)
nRBC: 0 % (ref 0–0)

## 2013-05-25 LAB — COMPREHENSIVE METABOLIC PANEL (CC13)
ALK PHOS: 118 U/L (ref 40–150)
ALT: 266 U/L — ABNORMAL HIGH (ref 0–55)
AST: 119 U/L — AB (ref 5–34)
Albumin: 3.4 g/dL — ABNORMAL LOW (ref 3.5–5.0)
Anion Gap: 10 mEq/L (ref 3–11)
BUN: 13.9 mg/dL (ref 7.0–26.0)
CALCIUM: 10.2 mg/dL (ref 8.4–10.4)
CHLORIDE: 107 meq/L (ref 98–109)
CO2: 24 mEq/L (ref 22–29)
Creatinine: 1 mg/dL (ref 0.6–1.1)
Glucose: 112 mg/dl (ref 70–140)
POTASSIUM: 4.1 meq/L (ref 3.5–5.1)
SODIUM: 141 meq/L (ref 136–145)
Total Bilirubin: 0.23 mg/dL (ref 0.20–1.20)
Total Protein: 6.6 g/dL (ref 6.4–8.3)

## 2013-05-25 MED ORDER — ZOLEDRONIC ACID 4 MG/100ML IV SOLN
4.0000 mg | Freq: Once | INTRAVENOUS | Status: AC
  Administered 2013-05-25: 4 mg via INTRAVENOUS
  Filled 2013-05-25: qty 100

## 2013-05-25 MED ORDER — FULVESTRANT 250 MG/5ML IM SOLN
500.0000 mg | Freq: Once | INTRAMUSCULAR | Status: AC
  Administered 2013-05-25: 500 mg via INTRAMUSCULAR
  Filled 2013-05-25: qty 10

## 2013-05-25 NOTE — Patient Instructions (Signed)
Zoledronic Acid injection (Hypercalcemia, Oncology) What is this medicine? ZOLEDRONIC ACID (ZOE le dron ik AS id) lowers the amount of calcium loss from bone. It is used to treat too much calcium in your blood from cancer. It is also used to prevent complications of cancer that has spread to the bone. This medicine may be used for other purposes; ask your health care provider or pharmacist if you have questions. COMMON BRAND NAME(S): Zometa What should I tell my health care provider before I take this medicine? They need to know if you have any of these conditions: -aspirin-sensitive asthma -cancer, especially if you are receiving medicines used to treat cancer -dental disease or wear dentures -infection -kidney disease -receiving corticosteroids like dexamethasone or prednisone -an unusual or allergic reaction to zoledronic acid, other medicines, foods, dyes, or preservatives -pregnant or trying to get pregnant -breast-feeding How should I use this medicine? This medicine is for infusion into a vein. It is given by a health care professional in a hospital or clinic setting. Talk to your pediatrician regarding the use of this medicine in children. Special care may be needed. Overdosage: If you think you have taken too much of this medicine contact a poison control center or emergency room at once. NOTE: This medicine is only for you. Do not share this medicine with others. What if I miss a dose? It is important not to miss your dose. Call your doctor or health care professional if you are unable to keep an appointment. What may interact with this medicine? -certain antibiotics given by injection -NSAIDs, medicines for pain and inflammation, like ibuprofen or naproxen -some diuretics like bumetanide, furosemide -teriparatide -thalidomide This list may not describe all possible interactions. Give your health care provider a list of all the medicines, herbs, non-prescription drugs, or  dietary supplements you use. Also tell them if you smoke, drink alcohol, or use illegal drugs. Some items may interact with your medicine. What should I watch for while using this medicine? Visit your doctor or health care professional for regular checkups. It may be some time before you see the benefit from this medicine. Do not stop taking your medicine unless your doctor tells you to. Your doctor may order blood tests or other tests to see how you are doing. Women should inform their doctor if they wish to become pregnant or think they might be pregnant. There is a potential for serious side effects to an unborn child. Talk to your health care professional or pharmacist for more information. You should make sure that you get enough calcium and vitamin D while you are taking this medicine. Discuss the foods you eat and the vitamins you take with your health care professional. Some people who take this medicine have severe bone, joint, and/or muscle pain. This medicine may also increase your risk for jaw problems or a broken thigh bone. Tell your doctor right away if you have severe pain in your jaw, bones, joints, or muscles. Tell your doctor if you have any pain that does not go away or that gets worse. Tell your dentist and dental surgeon that you are taking this medicine. You should not have major dental surgery while on this medicine. See your dentist to have a dental exam and fix any dental problems before starting this medicine. Take good care of your teeth while on this medicine. Make sure you see your dentist for regular follow-up appointments. What side effects may I notice from receiving this medicine? Side effects that   you should report to your doctor or health care professional as soon as possible: -allergic reactions like skin rash, itching or hives, swelling of the face, lips, or tongue -anxiety, confusion, or depression -breathing problems -changes in vision -eye pain -feeling faint or  lightheaded, falls -jaw pain, especially after dental work -mouth sores -muscle cramps, stiffness, or weakness -trouble passing urine or change in the amount of urine Side effects that usually do not require medical attention (report to your doctor or health care professional if they continue or are bothersome): -bone, joint, or muscle pain -constipation -diarrhea -fever -hair loss -irritation at site where injected -loss of appetite -nausea, vomiting -stomach upset -trouble sleeping -trouble swallowing -weak or tired This list may not describe all possible side effects. Call your doctor for medical advice about side effects. You may report side effects to FDA at 1-800-FDA-1088. Where should I keep my medicine? This drug is given in a hospital or clinic and will not be stored at home. NOTE: This sheet is a summary. It may not cover all possible information. If you have questions about this medicine, talk to your doctor, pharmacist, or health care provider.  2014, Elsevier/Gold Standard. (2012-06-19 13:03:13)   Fulvestrant injection What is this medicine? FULVESTRANT (ful VES trant) blocks the effects of estrogen. It is used to treat breast cancer in women past the age of menopause. This medicine may be used for other purposes; ask your health care provider or pharmacist if you have questions. COMMON BRAND NAME(S): FASLODEX What should I tell my health care provider before I take this medicine? They need to know if you have any of these conditions: -bleeding problems -liver disease -low levels of platelets in the blood -an unusual or allergic reaction to fulvestrant, other medicines, foods, dyes, or preservatives -pregnant or trying to get pregnant -breast-feeding How should I use this medicine? This medicine is for injection into a muscle. It is usually given by a health care professional in a hospital or clinic setting. Talk to your pediatrician regarding the use of this  medicine in children. Special care may be needed. Overdosage: If you think you have taken too much of this medicine contact a poison control center or emergency room at once. NOTE: This medicine is only for you. Do not share this medicine with others. What if I miss a dose? It is important not to miss your dose. Call your doctor or health care professional if you are unable to keep an appointment. What may interact with this medicine? -medicines that treat or prevent blood clots like warfarin, enoxaparin, and dalteparin This list may not describe all possible interactions. Give your health care provider a list of all the medicines, herbs, non-prescription drugs, or dietary supplements you use. Also tell them if you smoke, drink alcohol, or use illegal drugs. Some items may interact with your medicine. What should I watch for while using this medicine? Your condition will be monitored carefully while you are receiving this medicine. You will need important blood work done while you are taking this medicine. Do not become pregnant while taking this medicine. Women should inform their doctor if they wish to become pregnant or think they might be pregnant. There is a potential for serious side effects to an unborn child. Talk to your health care professional or pharmacist for more information. What side effects may I notice from receiving this medicine? Side effects that you should report to your doctor or health care professional as soon as possible: -allergic   reactions like skin rash, itching or hives, swelling of the face, lips, or tongue -feeling faint or lightheaded, falls -fever or flu-like symptoms -sore throat -vaginal bleeding Side effects that usually do not require medical attention (report to your doctor or health care professional if they continue or are bothersome): -aches, pains -constipation or diarrhea -headache -hot flashes -nausea, vomiting -pain at site where  injected -stomach pain This list may not describe all possible side effects. Call your doctor for medical advice about side effects. You may report side effects to FDA at 1-800-FDA-1088. Where should I keep my medicine? This drug is given in a hospital or clinic and will not be stored at home. NOTE: This sheet is a summary. It may not cover all possible information. If you have questions about this medicine, talk to your doctor, pharmacist, or health care provider.  2014, Elsevier/Gold Standard. (2007-05-19 15:39:24)  

## 2013-05-26 LAB — CANCER ANTIGEN 27.29: CA 27.29: 441 U/mL — ABNORMAL HIGH (ref 0–39)

## 2013-05-26 LAB — CEA: CEA: 96 ng/mL — AB (ref 0.0–5.0)

## 2013-05-28 ENCOUNTER — Ambulatory Visit: Payer: Medicare Other

## 2013-05-28 ENCOUNTER — Other Ambulatory Visit: Payer: Medicare Other

## 2013-06-01 ENCOUNTER — Encounter (HOSPITAL_COMMUNITY)
Admission: RE | Admit: 2013-06-01 | Discharge: 2013-06-01 | Disposition: A | Discharge status: Home / Self Care | Payer: Medicare Other | Source: Ambulatory Visit | Attending: Oncology | Admitting: Oncology

## 2013-06-01 ENCOUNTER — Ambulatory Visit (HOSPITAL_BASED_OUTPATIENT_CLINIC_OR_DEPARTMENT_OTHER): Payer: Medicare Other | Admitting: Oncology

## 2013-06-01 ENCOUNTER — Encounter (HOSPITAL_COMMUNITY): Payer: Self-pay

## 2013-06-01 ENCOUNTER — Other Ambulatory Visit: Payer: Self-pay | Admitting: Oncology

## 2013-06-01 DIAGNOSIS — C773 Secondary and unspecified malignant neoplasm of axilla and upper limb lymph nodes: Secondary | ICD-10-CM

## 2013-06-01 DIAGNOSIS — C50919 Malignant neoplasm of unspecified site of unspecified female breast: Secondary | ICD-10-CM

## 2013-06-01 DIAGNOSIS — C7951 Secondary malignant neoplasm of bone: Secondary | ICD-10-CM | POA: Insufficient documentation

## 2013-06-01 DIAGNOSIS — C50512 Malignant neoplasm of lower-outer quadrant of left female breast: Secondary | ICD-10-CM

## 2013-06-01 DIAGNOSIS — C7952 Secondary malignant neoplasm of bone marrow: Secondary | ICD-10-CM

## 2013-06-01 DIAGNOSIS — R071 Chest pain on breathing: Secondary | ICD-10-CM

## 2013-06-01 DIAGNOSIS — C50519 Malignant neoplasm of lower-outer quadrant of unspecified female breast: Secondary | ICD-10-CM | POA: Insufficient documentation

## 2013-06-01 LAB — GLUCOSE, CAPILLARY: Glucose-Capillary: 96 mg/dL (ref 70–99)

## 2013-06-01 MED ORDER — FLUDEOXYGLUCOSE F - 18 (FDG) INJECTION
9.4000 | Freq: Once | INTRAVENOUS | Status: AC | PRN
  Administered 2013-06-01: 9.4 via INTRAVENOUS

## 2013-06-01 MED ORDER — HYDROMORPHONE HCL 2 MG PO TABS: 2.0000 mg | ORAL_TABLET | Freq: Four times a day (QID) | ORAL | Status: DC | PRN

## 2013-06-01 NOTE — Progress Notes (Signed)
ID: Brooke [name]   DOB: 16-Nov-1950  MR#: [id number]  RAQ#:[id number]  PCP: Irven Shelling, MD GYN: SU:  OTHER MD: Thea Silversmith, Gaynelle Arabian, Shanu [place] [phone]), Willa Rough DDS  CHIEF COMPLAINT:  "I'm hurting".  BREAST CANCER HISTORY: Brooke [name] had screening mammography in August 04, 2007 which showed no specific mammographic evidence of malignancy, but she reported a left breast lump.  Accordingly, she was brought back on August 11, 2007 for mammography and ultrasonography.  There was indeed an obscured mass in the upper left breast which by ultrasound appeared to be a simple cyst.    Screening mammogram on August 04, 2008 showed in addition to dense breasts, again a possible mass in the left breast.  She had diagnostic mammography August 06, 2008 and additional views in addition to very dense breasts did not show any persistent mass or distortion.  Furthermore, Dr. Owens Shark was not able to palpate any abnormality in the lateral portion of the left breast.  Ultrasound showed normal appearing fibroglandular tissue in the area in question.    More recently, about September, the patient felt again something like a lump in the left breast and she had some pain associated with this.  This was initially felt simply to be mastalgia and was treated as such, but as it persisted, on December 23rd, the patient had left diagnostic mammography and ultrasonography.  There was a focally dense area in the left breast with very minimal distortion corresponding to the area of palpable abnormality. The ultrasound showed an irregular ill-defined, hypoechoic area with distal shadowing, measuring approximately 1 cm corresponding to the patient's palpable abnormality.    With this information, the patient was brought back for ultrasound-guided core biopsy and this was performed December 28th.  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  There was no  evidence of angiolymphatic invasion in the material submitted which measured 1.5 cm maximally.  In addition to the mass in the breast, a lymph node in the left axilla was biopsied and was likewise positive.  The tumor was ER positive at 89%, PR positive at 34% with an elevated MIB-1 at 44% and Her-2 negative with a ratio of 1.13.    Her subsequent history is as detailed below.  INTERVAL HISTORY: Brooke [name]' was seen today for an unscheduled visit, accompanied by her husband [name]. She came today for PET scan and was supposed to see me in 2 days to get those results. However she developed a severe pain in the left chest wall area. She was extremely distressed and walked over here to be evaluated.  REVIEW OF SYSTEMS: The pain just started this morning. She took 2 mg of Tylenol that at 7 in the morning and then took another 2 mg at 10 in the morning with no relief. Usually she takes 2 mg twice daily and that keeps her going "really well". She denies any change in urine and has seen and no evidence of blood in her urine. There has been no cough, phlegm production or pleurisy and there has been no trauma that might explain the left chest wall pain. She denies any new pain elsewhere. A detailed review of systems today was noncontributory  PAST MEDICAL HISTORY: Past Medical History  Diagnosis Date  . Hx Breast Cancer, IDC, Stahe III, receptor + 10/20/2010    left breast  . Breast cancer December 2010    invasive ductal and invasive lobular; bilateral mastectomy and radiation  .  Mitral valve prolapse   . History of recurrent UTIs   . Radiation 07/26/09-09/08/09    left breast  . Fracture, pelvis closed 11/2012    both hipsand number 7 rib, 15 rounds radiation  1. Remote migraines, currently inactive.   2. History of prior diagnosis of fibromyalgia made at [facility] and currently not active (she was treated with trimethoprim and nortriptyline for one year for this).    3. History of recurrent  UTIs.   4. History of benign left breast cyst aspirated in 2004 at [place] under Dr. Melene Plan.   5. History of tonsillectomy and adenoidectomy.   6. History of right LASEK surgery. 7. History of bilateral reduction mammoplasties in 1994.   8. History of mitral valve prolapse diagnosed in 1984.  PAST SURGICAL HISTORY: Past Surgical History  Procedure Laterality Date  . Mastectomy Bilateral 2011    left breast cancer  . Lasik    . Reduction mammaplasty Bilateral 1994  . Tonsillectomy and adenoidectomy    . Aspiration of left breast Left 2004    [facility]/Dr.Teo  . Bone biopsy  02/19/13    mets from breast cancer-receptors results not back    FAMILY HISTORY Family History  Problem Relation Age of Onset  . Lung cancer Father 32    smoker for 25 years  . Heart disease Maternal Grandmother   . Heart disease Maternal Grandfather   . Diabetes type I Mother   . Diabetes type I Sister   . Diabetes type I Sister   . Heart disease Maternal Uncle   . Uterine cancer Paternal Grandmother 59  . Breast cancer Other 65    paternal great grandmother  [name] died at the age of 49 from lung cancer.  He had been a remote smoker and had been in [employer] with a question of possible asbestos exposure.  [name] died at the age of 6 in the setting of Alzheimer's, coronary artery disease, diabetes and hypertension.  The patient had two sisters.  One died from complications of diabetes.  The other one is alive with diabetes.    GYNECOLOGIC HISTORY: She is GX P1.  First pregnancy to term at age 26.  The patient's last period was in 2008.  She took hormone replacement for about 18 months until mid-2008.  She had no complications from that treatment.    SOCIAL HISTORY: (Updated October 2014) She used to be a Arts development officer for [employer]  They used to live in the [place] area. 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  She is  now retired.  Her husband, [name], retired October 2012.  He was a Arts development officer for 28 years.  Their son, [name], is currently with the [employer] at the [employer] in [place]. [name].  She attends [employer].   ADVANCED DIRECTIVES: in place  HEALTH MAINTENANCE: (Updated 11/19/2012) History  Substance Use Topics  . Smoking status: Former Smoker -- 1.00 packs/day for 11 years    Quit date: 01/23/1984  . Smokeless tobacco: Never Used  . Alcohol Use: 1.5 - 2.5 oz/week    3-5 drink(s) per week     Comment: glasses of wine     Colonoscopy: 2010  PAP: Jan 2014  Bone density: July 2014, osteopenia  Lipid panel: per Dr Valentina Lucks  Allergies  Allergen Reactions  . Sulfa Antibiotics Shortness Of Breath  . Codeine  Nausea And Vomiting    Tolerates oxycodone if given with prochlorperazine for nausea  . Morphine And Related Nausea And Vomiting    Per patient possibility that the IR morphine was causing the vomiting   Scheduled Meds: Continuous Infusions: PRN Meds:.   OBJECTIVE: Middle-aged white woman who appears in poorly controlled pain  There were no vitals filed for this visit. Vitals - 1 value per visit 06/25/6801  SYSTOLIC 212  DIASTOLIC 73  Pulse 82  Temperature 98.4  Respirations 18  Weight (lb)   Height   BMI   VISIT REPORT    Sclerae unicteric, pupils round and equal Oropharynx clear and moist No cervical or supraclavicular adenopathy Lungs no rales or rhonchi Heart regular rate and rhythm Abd soft, nontender, positive bowel sounds MSK no focal spinal tenderness, no upper extremity lymphedema; palpation of the left chest wall area shows focal tenderness at approximately the seventh or eighth left rib laterally. No masses are palpated Neuro: nonfocal, well oriented, stressed affect Breasts: Deferred  LAB RESULTS: Results for RINA, ADNEY (MRN [id number]) as of 06/01/2013 13:19  Ref. Range 07/24/2010 13:28  12/12/2010 13:25 01/07/2012 13:47 04/27/2013 14:40 05/25/2013 14:25  CA 27.29 Latest Range: 0-39 U/mL 17 20 21  363 (H) 441 (H)  Results for MIRAGE, PFEFFERKORN (MRN [id number]) as of 06/01/2013 13:19  Ref. Range 03/18/2013 09:26 04/27/2013 14:40 05/25/2013 14:25  CEA Latest Range: 0.0-5.0 ng/mL 51.9 (H) 81.4 (H) 96.0 (H)  Results for BURNADETTE, BASKETT (MRN [id number]) as of 06/01/2013 13:19  Ref. Range 03/28/2013 03:46 03/29/2013 03:50 04/27/2013 12:47 04/30/2013 13:01 05/25/2013 14:25  Alkaline Phosphatase Latest Range: 39-117 U/L 73 65 105 91 118  Results for EULOGIA, DISMORE (MRN [id number]) as of 06/01/2013 13:19  Ref. Range 03/28/2013 03:46 03/29/2013 03:50 04/27/2013 12:47 04/30/2013 13:01 05/25/2013 14:25  AST Latest Range: 0-37 U/L 34 49 (H) 75 (H) 67 (H) 119 (H)  Results for ALEIRA, DEITER (MRN [id number]) as of 06/01/2013 13:19  Ref. Range 03/28/2013 03:46 03/29/2013 03:50 04/27/2013 12:47 04/30/2013 13:01 05/25/2013 14:25  ALT Latest Range: 0-35 U/L 32 42 (H) 117 (H) 93 (H) 266 (H)    Lab Results  Component Value Date   WBC 4.4 05/25/2013   NEUTROABS 2.4 05/25/2013   HGB 12.0 05/25/2013   HCT 35.6 05/25/2013   MCV 85.4 05/25/2013   PLT 198 05/25/2013      Chemistry      Component Value Date/Time   NA 141 05/25/2013 1425   NA 141 03/29/2013 0350   NA 142 06/26/2011 0728   K 4.1 05/25/2013 1425   K 3.7 03/29/2013 0350   K 4.6 06/26/2011 0728   CL 111 03/29/2013 0350   CL 105 01/07/2012 1347   CL 102 06/26/2011 0728   CO2 24 05/25/2013 1425   CO2 18* 03/29/2013 0350   CO2 30 06/26/2011 0728   BUN 13.9 05/25/2013 1425   BUN 10 03/29/2013 0350   BUN 12 06/26/2011 0728   CREATININE 1.0 05/25/2013 1425   CREATININE 0.72 03/29/2013 0350   CREATININE 1.1 06/26/2011 0728      Component Value Date/Time   CALCIUM 10.2 05/25/2013 1425   CALCIUM 7.8* 03/29/2013 0350   CALCIUM 9.1 06/26/2011 0728   ALKPHOS 118 05/25/2013 1425   ALKPHOS 91 04/30/2013 1301   AST 119* 05/25/2013 1425   AST 67* 04/30/2013 1301   ALT 266* 05/25/2013 1425   ALT 93* 04/30/2013 1301    BILITOT 0.23 05/25/2013 1425   BILITOT 0.4  04/30/2013 1301       Lab Results  Component Value Date   LABCA2 441* 05/25/2013    STUDIES: Nm Pet Image Restag (ps) Skull Base To Thigh  06/01/2013   CLINICAL DATA:  Subsequent treatment strategy for metastatic breast carcinoma.  EXAM: NUCLEAR MEDICINE PET SKULL BASE TO THIGH  TECHNIQUE: 9.4 mCi F-18 FDG was injected intravenously. Full-ring PET imaging was performed from the skull base to thigh after the radiotracer. CT data was obtained and used for attenuation correction and anatomic localization.  FASTING BLOOD GLUCOSE:  Value: 96 mg/dl  COMPARISON:  03/23/2013  FINDINGS: NECK  No hypermetabolic lymph nodes in the neck.  CHEST  No hypermetabolic mediastinal or hilar nodes. No suspicious pulmonary nodules on the CT scan.  ABDOMEN/PELVIS  No abnormal hypermetabolic activity within the liver, pancreas, adrenal glands, or spleen. No hypermetabolic lymph nodes in the abdomen or pelvis.  SKELETON  There has been significant increase in hypermetabolic activity associated with diffuse osseous metastatic disease throughout the neck, chest, abdomen, and pelvis since prior exam. Index lesion in the left iliac wing currently has a maximum SUV of 9.4 on image 149 compared to 7.1 on prior exam. Another index lesion in the right ilium has a maximum SUV of 8.7 on image 149 compared to 6.9 on prior exam. The CT appearance of these mixed sclerotic and lytic bone metastases shows no significant change. This suggests hypermetabolic response to treatment, with progression of osseous metastatic disease considered less likely.  IMPRESSION: No evidence of soft tissue metastatic disease.  Increased hypermetabolic activity associated with diffuse osseous metastatic disease, although the CT appearance shows no significant change. This suggests hypermetabolic response to treatment, with progression of osseous metastatic disease considered less likely.   Electronically Signed   By: Earle Gell M.D.   On: 06/01/2013 12:19    ASSESSMENT: 63 y.o. [place] woman   (1)  status post Left breast and Left axillary lymph node biopsy Dec 2010 for a cT3 pN1 (stage IIIA) invasive ductal carcinoma, grade 2, estrogen and progesterone receptor positive, HER-2 negative, with an MIB-1 of 44%;   (2)  treated neoadjuvantly with dose dense cyclophosphamide and doxorubicin x4 followed by weekly paclitaxel x7  (3) s/p bilateral mastectomies May 2011. She had a residual 1.5 cm tumor, and one of 8 sampled lymph nodes was positive [ypT1c ypN1]. Margins were negative.   (4)  She completed post-mastectomy radiation in August of 2011   (5) on letrozole between August 2011 and October 2014  (6) PET scan 11/05/2012 obtained to evaluate right chest wall pain documents bony metastatic disease to the right seventh rib, L1, and the right acetabulum with an associated pathologic fracture through the anterior cortex of the acetabulum measuring 3.2 cm. There was no evidence of extra osseous metastatic disease.  (7)  as of 11/05/2012, receiving fulvestrant injections, 500 mg monthly, in addition to monthly infusions of zoledronic acid.  (8) Right rib, left hip and right hip were all treated to 37.5 Gy in 15 fractions at 2.5 Gy per fraction completed 12/17/2012  (9) 02/19/2013 bone marrow biopsy confirms metastatic disease to bone. HER-2 was not done because of concerns regarding because of fine the specimen. Estrogen and progesterone receptors were read as negative. This is discordant.  (10) patient's tumor carries a PIK3 mutation  (11) pain: controlled on hydromorphone 2 mg po TID; bowel prophylaxis effective   PLAN:  I asked Pat to take an additional 4 mg a pilot it now, and  then to take 1 or 2 tablets every 2 hours or as needed for the next 24 hours. I have set her up to see a radiation oncologist, Dr. Pablo Ledger, tomorrow, and if the pain persists and review of the PET scan shows a target of the lesion  she may consider simulation and perhaps a brief course of radiation to the painful area. I am also sending results of the PET scan and lab work to Dr. Benjie Karvonen [facility] [place] for her input. Had already has an appointment with me 2 days from now. She knows to call for any additional problems that may develop before that visit.    Chauncey Cruel, MD 06/01/2013

## 2013-06-02 ENCOUNTER — Telehealth: Payer: Self-pay | Admitting: *Deleted

## 2013-06-02 ENCOUNTER — Ambulatory Visit
Admission: RE | Admit: 2013-06-02 | Discharge: 2013-06-02 | Disposition: A | Discharge status: Home / Self Care | Payer: Medicare Other | Source: Ambulatory Visit | Attending: Radiation Oncology | Admitting: Radiation Oncology

## 2013-06-02 VITALS — BP 129/74 | HR 97 | Temp 98.4°F | Wt 178.7 lb

## 2013-06-02 DIAGNOSIS — C7951 Secondary malignant neoplasm of bone: Principal | ICD-10-CM

## 2013-06-02 DIAGNOSIS — C50919 Malignant neoplasm of unspecified site of unspecified female breast: Secondary | ICD-10-CM

## 2013-06-02 NOTE — Progress Notes (Addendum)
Department of Radiation Oncology  Phone:  [phone] Fax:        [phone]   Name: Brooke [name] MRN: [id number]  DOB: [birth year]  Date: 06/02/2013  Follow Up Visit Note  Diagnosis: Metastatic (Stage IV) breast cancer   Summary and Interval since last radiation: 60.4 Gy to the left chest wall completed 08/2009. 37.5 gy in 15 fractions to the left hip, right hip and right ribs completed 12/17/12  Interval History: Brooke [name] presents today for followup. She has been on fulvestrant she'll recently. She reports her about the past month she had soreness of her left side. For the past 4-5 days this is gotten worse to the point where she has 10 out of 10 pain. This is controlled with Dilaudid around-the-clock which also as a side effect of making her drowsy. She is awaiting recommendations from her physician in [facility] regarding systemic treatment. She is meeting with Dr. Jana Hakim tomorrow. She had a PET scan yesterday which really shows a mixed response. This shows hypermetabolic activity with no increase in the CT appearance of the osseous metastatic disease. There are a couple spots of metastatic disease on a few of her left ribs in the area that she is complaining of pinpoint pain. She can not recall any falls or trauma to this area. She can point to the exact place where the pain starts. She denies any shortness of breath or other chest pain. Her right side is totally controlled and she has no pain in her bilateral hips.  Allergies:  Allergies  Allergen Reactions  . Sulfa Antibiotics Shortness Of Breath  . Codeine Nausea And Vomiting    Tolerates oxycodone if given with prochlorperazine for nausea  . Morphine And Related Nausea And Vomiting    Per patient possibility that the IR morphine was causing the vomiting    Medications:  Current Outpatient Prescriptions  Medication Sig Dispense Refill  . ALPRAZolam (XANAX XR) 1 MG 24 hr tablet Take 1 mg by mouth. Up to  twice daily as needed for anxiety      . Calcium Citrate (CITRACAL PO) Take 3 tablets by mouth daily.      . cholecalciferol (VITAMIN D) 400 UNITS TABS tablet Take 400 Units by mouth daily. Patient reportd the Vitamin E should be vitamin D with same dose      . diphenhydrAMINE (BENADRYL) 25 mg capsule Take 25 mg by mouth at bedtime as needed for sleep.       Brooke [name] Calcium (STOOL SOFTENER PO) Take by mouth daily.      . fulvestrant (FASLODEX) 250 MG/5ML injection Inject 250 mg into the muscle every 30 (thirty) days. One injection each buttock over 1-2 minutes. Warm prior to use.      . gabapentin (NEURONTIN) 300 MG capsule Take 300 mg by mouth at bedtime.       [name] Kitchen HYDROmorphone (DILAUDID) 2 MG tablet Take 1 tablet (2 mg total) by mouth every 6 (six) hours as needed for moderate pain or severe pain.  120 tablet  0  . Polyethyl Glycol-Propyl Glycol (SYSTANE) 0.4-0.3 % SOLN Apply 1 drop to eye 2 (two) times daily as needed (dry eyes).      . Polyethylene Glycol 3350 (MIRALAX PO) Take by mouth daily.      . prochlorperazine (COMPAZINE) 5 MG tablet Take 1 tablet (5 mg total) by mouth 3 (three) times daily before meals.  90 tablet  3  . Vaginal Lubricant (REPLENS VA) Place vaginally. Combining  with estroglide      . venlafaxine XR (EFFEXOR-XR) 37.5 MG 24 hr capsule TAKE 2 CAPSULES DAILY  180 capsule  1  . Zoledronic Acid (ZOMETA IV) Inject into the vein every 30 (thirty) days.      . clobetasol cream (TEMOVATE) 0.09 % Apply 1 application topically 2 (two) times daily.  30 g  0   No current facility-administered medications for this encounter.    Physical Exam:  Filed Vitals:   06/02/13 1231  BP: 129/74  Pulse: 97  Temp: 98.4 F (36.9 C)  Weight: 178 lb 11.2 oz (81.058 kg)   pinpoint tenderness in the mid thoracic rib at the mid axillary line on the left about 3 fingerbreadths above her tattoo.  IMPRESSION: Brooke [name] is a 63 y.o. female with left sided flank pain and a history of previous  radiation on the side  PLAN:  I spoke to Brooke [name] and her husband. We discussed the previous radiation on the side. Looking at her PET scan she does have a couple ribs of hypermetabolic activity. This may be the source of her pain. We can now ahead and try and treat this. To me her complaints seem very similar to what she was complaining of on the right side and that seems to have resolved with radiation treatment. We discussed hypofractionated coarse but I feel because of her previous radiation she would benefit from a more prolonged fractionation. We discussed the increased risk of rib fracture in patients who are re-irradiated. I have scheduled her for simulation on Thursday. Hopefully we can turn around her plan fairly quickly.   Brooke Silversmith, MD

## 2013-06-02 NOTE — Telephone Encounter (Signed)
Per MD request this RN contacted the radiology department for CD of most recent PET scan to be sent to Dr Vernice Jefferson at [facility]- Per Tedra Coupe CD was already requested and sent.

## 2013-06-03 ENCOUNTER — Ambulatory Visit (HOSPITAL_BASED_OUTPATIENT_CLINIC_OR_DEPARTMENT_OTHER): Payer: Medicare Other | Admitting: Oncology

## 2013-06-03 ENCOUNTER — Telehealth: Payer: Self-pay | Admitting: Oncology

## 2013-06-03 VITALS — BP 131/81 | HR 93 | Temp 97.8°F | Resp 18 | Ht 64.0 in | Wt 181.1 lb

## 2013-06-03 DIAGNOSIS — C773 Secondary and unspecified malignant neoplasm of axilla and upper limb lymph nodes: Secondary | ICD-10-CM

## 2013-06-03 DIAGNOSIS — R0781 Pleurodynia: Secondary | ICD-10-CM

## 2013-06-03 DIAGNOSIS — R079 Chest pain, unspecified: Secondary | ICD-10-CM

## 2013-06-03 DIAGNOSIS — C7952 Secondary malignant neoplasm of bone marrow: Secondary | ICD-10-CM

## 2013-06-03 DIAGNOSIS — R7989 Other specified abnormal findings of blood chemistry: Secondary | ICD-10-CM

## 2013-06-03 DIAGNOSIS — C50519 Malignant neoplasm of lower-outer quadrant of unspecified female breast: Secondary | ICD-10-CM

## 2013-06-03 DIAGNOSIS — C50512 Malignant neoplasm of lower-outer quadrant of left female breast: Secondary | ICD-10-CM

## 2013-06-03 DIAGNOSIS — Z170 Estrogen receptor positive status [ER+]: Secondary | ICD-10-CM

## 2013-06-03 DIAGNOSIS — C7951 Secondary malignant neoplasm of bone: Secondary | ICD-10-CM

## 2013-06-03 DIAGNOSIS — C50919 Malignant neoplasm of unspecified site of unspecified female breast: Secondary | ICD-10-CM

## 2013-06-03 NOTE — Progress Notes (Signed)
ID: Brooke [name]   DOB: 02-Dec-1950  MR#: [id number]  WNI#:[id number]  PCP: Irven Shelling, MD GYN: SU:  OTHER MD: Brooke [name], Brooke [name], Brooke [name] [phone]), Brooke [name]  CHIEF COMPLAINT:  "I'm hurting".  BREAST CANCER HISTORY: Brooke [name] had screening mammography in August 04, 2007 which showed no specific mammographic evidence of malignancy, but she reported a left breast lump.  Accordingly, she was brought back on August 11, 2007 for mammography and ultrasonography.  There was indeed an obscured mass in the upper left breast which by ultrasound appeared to be a simple cyst.    Screening mammogram on August 04, 2008 showed in addition to dense breasts, again a possible mass in the left breast.  She had diagnostic mammography August 06, 2008 and additional views in addition to very dense breasts did not show any persistent mass or distortion.  Furthermore, Dr. Owens Shark was not able to palpate any abnormality in the lateral portion of the left breast.  Ultrasound showed normal appearing fibroglandular tissue in the area in question.    More recently, about September, the patient felt again something like a lump in the left breast and she had some pain associated with this.  This was initially felt simply to be mastalgia and was treated as such, but as it persisted, on December 23rd, the patient had left diagnostic mammography and ultrasonography.  There was a focally dense area in the left breast with very minimal distortion corresponding to the area of palpable abnormality. The ultrasound showed an irregular ill-defined, hypoechoic area with distal shadowing, measuring approximately 1 cm corresponding to the patient's palpable abnormality.    With this information, the patient was brought back for ultrasound-guided core biopsy and this was performed December 28th.  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  There was no  evidence of angiolymphatic invasion in the material submitted which measured 1.5 cm maximally.  In addition to the mass in the breast, a lymph node in the left axilla was biopsied and was likewise positive.  The tumor was ER positive at 89%, PR positive at 34% with an elevated MIB-1 at 44% and Her-2 negative with a ratio of 1.13.    Her subsequent history is as detailed below.  INTERVAL HISTORY: Brooke [name] returns today accompanied by her husband [name] for further followup of her metastatic breast cancer. Since her last visit here, [facility] are feeling is, like mine, and despite the fact that we do not see new lesions on the PET scan given the elevated markers and new symptoms, we are dealing with disease progression. The patient also met with Dr. Pablo Ledger who is planning palliative radiation to the left rib cage area starting tomorrow, for 10 doses.[name] Kitchen   REVIEW OF SYSTEMS: Brooke [name]'s pain is much better controlled at higher doses of Dilantin. She uses 6-8 tablets a day of the 2 mg tablets. She has a good bowel prophylaxis regimen and has one or 2 soft bowel movements daily. She does get slightly confused or better has a harder time focusing while on this medication. She has minimal nausea. A detailed review of systems today was otherwise stable  PAST MEDICAL HISTORY: Past Medical History  Diagnosis Date  . Hx Breast Cancer, IDC, Stahe III, receptor + 10/20/2010    left breast  . Breast cancer December 2010    invasive ductal and invasive lobular; bilateral mastectomy and radiation  . Mitral valve  prolapse   . History of recurrent UTIs   . Radiation 07/26/09-09/08/09    left breast  . Fracture, pelvis closed 11/2012    both hipsand number 7 rib, 15 rounds radiation  1. Remote migraines, currently inactive.   2. History of prior diagnosis of fibromyalgia made at [facility] and currently not active (she was treated with trimethoprim and nortriptyline for one  year for this).    3. History of recurrent UTIs.   4. History of benign left breast cyst aspirated in 2004 at [facility] under Dr. Melene Plan.   5. History of tonsillectomy and adenoidectomy.   6. History of right LASEK surgery. 7. History of bilateral reduction mammoplasties in 1994.   8. History of mitral valve prolapse diagnosed in 1984.  PAST SURGICAL HISTORY: Past Surgical History  Procedure Laterality Date  . Mastectomy Bilateral 2011    left breast cancer  . Lasik    . Reduction mammaplasty Bilateral 1994  . Tonsillectomy and adenoidectomy    . Aspiration of left breast Left 2004    [facility]/Dr.Teo  . Bone biopsy  02/19/13    mets from breast cancer-receptors results not back    FAMILY HISTORY Family History  Problem Relation Age of Onset  . Lung cancer Father 1    smoker for 25 years  . Heart disease Maternal Grandmother   . Heart disease Maternal Grandfather   . Diabetes type I Mother   . Diabetes type I Sister   . Diabetes type I Sister   . Heart disease Maternal Uncle   . Uterine cancer Paternal Grandmother 54  . Breast cancer Other 53    paternal great grandmother  [name] died at the age of 58 from lung cancer.  He had been a remote smoker and had been in [employer] with a question of possible asbestos exposure.  [name] died at the age of 3 in the setting of Alzheimer's, coronary artery disease, diabetes and hypertension.  The patient had two sisters.  One died from complications of diabetes.  The other one is alive with diabetes.    GYNECOLOGIC HISTORY: She is GX P1.  First pregnancy to term at age 67.  The patient's last period was in 2008.  She took hormone replacement for about 18 months until mid-2008.  She had no complications from that treatment.    SOCIAL HISTORY: (Updated October 2014) She used to be a Arts development officer for [employer]  They used to live in the [place] area. 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  Her husband, [name], retired October 2012.  He was a Arts development officer for 28 years.  Their son, [name], is currently with the [employer] at the [employer] in [place]. [name].  She attends [employer].   ADVANCED DIRECTIVES: in place  HEALTH MAINTENANCE: (Updated 11/19/2012) History  Substance Use Topics  . Smoking status: Former Smoker -- 1.00 packs/day for 11 years    Quit date: 01/23/1984  . Smokeless tobacco: Never Used  . Alcohol Use: 1.5 - 2.5 oz/week    3-5 drink(s) per week     Comment: glasses of wine     Colonoscopy: 2010  PAP: Jan 2014  Bone density: July 2014, osteopenia  Lipid panel: per Dr Valentina Lucks  Allergies  Allergen Reactions  . Sulfa Antibiotics Shortness Of Breath  . Codeine Nausea And  Vomiting    Tolerates oxycodone if given with prochlorperazine for nausea  . Morphine And Related Nausea And Vomiting    Per patient possibility that the IR morphine was causing the vomiting   Scheduled Meds: Continuous Infusions: PRN Meds:.   OBJECTIVE: Middle-aged white woman who appears stated age  31 Vitals:   06/03/13 1605  BP: 131/81  Pulse: 93  Temp: 97.8 F (36.6 C)  Resp: 18    Sclerae unicteric, EOMs intact Oropharynx clear, teeth in good repair No cervical or supraclavicular adenopathy Lungs no rales or rhonchi Heart regular rate and rhythm, no murmur appreciated Abd soft, nontender, positive bowel sounds MSK no focal spinal tenderness, no upper extremity lymphedema; left chest wall palpation shows focal tenderness at approximately the seventh or eighth left rib laterally. No masses are palpated Neuro: nonfocal, well oriented, positive affect Breasts: Deferred  LAB RESULTS: Results for FAYOLA, MECKES (MRN [id number]) as of 06/01/2013 13:19  Ref. Range 07/24/2010 13:28 12/12/2010 13:25 01/07/2012 13:47 04/27/2013 14:40 05/25/2013 14:25  CA 27.29  Latest Range: 0-39 U/mL 17 20 21  363 (H) 441 (H)  Results for NELISSA, BOLDUC (MRN [id number]) as of 06/01/2013 13:19  Ref. Range 03/18/2013 09:26 04/27/2013 14:40 05/25/2013 14:25  CEA Latest Range: 0.0-5.0 ng/mL 51.9 (H) 81.4 (H) 96.0 (H)  Results for AIMAN, SONN (MRN [id number]) as of 06/01/2013 13:19  Ref. Range 03/28/2013 03:46 03/29/2013 03:50 04/27/2013 12:47 04/30/2013 13:01 05/25/2013 14:25  Alkaline Phosphatase Latest Range: 39-117 U/L 73 65 105 91 118  Results for DESMA, WILKOWSKI (MRN [id number]) as of 06/01/2013 13:19  Ref. Range 03/28/2013 03:46 03/29/2013 03:50 04/27/2013 12:47 04/30/2013 13:01 05/25/2013 14:25  AST Latest Range: 0-37 U/L 34 49 (H) 75 (H) 67 (H) 119 (H)  Results for GWEN, SARVIS (MRN [id number]) as of 06/01/2013 13:19  Ref. Range 03/28/2013 03:46 03/29/2013 03:50 04/27/2013 12:47 04/30/2013 13:01 05/25/2013 14:25  ALT Latest Range: 0-35 U/L 32 42 (H) 117 (H) 93 (H) 266 (H)    Lab Results  Component Value Date   WBC 4.4 05/25/2013   NEUTROABS 2.4 05/25/2013   HGB 12.0 05/25/2013   HCT 35.6 05/25/2013   MCV 85.4 05/25/2013   PLT 198 05/25/2013      Chemistry      Component Value Date/Time   NA 141 05/25/2013 1425   NA 141 03/29/2013 0350   NA 142 06/26/2011 0728   K 4.1 05/25/2013 1425   K 3.7 03/29/2013 0350   K 4.6 06/26/2011 0728   CL 111 03/29/2013 0350   CL 105 01/07/2012 1347   CL 102 06/26/2011 0728   CO2 24 05/25/2013 1425   CO2 18* 03/29/2013 0350   CO2 30 06/26/2011 0728   BUN 13.9 05/25/2013 1425   BUN 10 03/29/2013 0350   BUN 12 06/26/2011 0728   CREATININE 1.0 05/25/2013 1425   CREATININE 0.72 03/29/2013 0350   CREATININE 1.1 06/26/2011 0728      Component Value Date/Time   CALCIUM 10.2 05/25/2013 1425   CALCIUM 7.8* 03/29/2013 0350   CALCIUM 9.1 06/26/2011 0728   ALKPHOS 118 05/25/2013 1425   ALKPHOS 91 04/30/2013 1301   AST 119* 05/25/2013 1425   AST 67* 04/30/2013 1301   ALT 266* 05/25/2013 1425   ALT 93* 04/30/2013 1301   BILITOT 0.23 05/25/2013 1425   BILITOT 0.4 04/30/2013 1301       Lab Results   Component Value Date   LABCA2 441* 05/25/2013    STUDIES: Nm Pet Image  Restag (ps) Skull Base To Thigh  06/01/2013   CLINICAL DATA:  Subsequent treatment strategy for metastatic breast carcinoma.  EXAM: NUCLEAR MEDICINE PET SKULL BASE TO THIGH  TECHNIQUE: 9.4 mCi F-18 FDG was injected intravenously. Full-ring PET imaging was performed from the skull base to thigh after the radiotracer. CT data was obtained and used for attenuation correction and anatomic localization.  FASTING BLOOD GLUCOSE:  Value: 96 mg/dl  COMPARISON:  03/23/2013  FINDINGS: NECK  No hypermetabolic lymph nodes in the neck.  CHEST  No hypermetabolic mediastinal or hilar nodes. No suspicious pulmonary nodules on the CT scan.  ABDOMEN/PELVIS  No abnormal hypermetabolic activity within the liver, pancreas, adrenal glands, or spleen. No hypermetabolic lymph nodes in the abdomen or pelvis.  SKELETON  There has been significant increase in hypermetabolic activity associated with diffuse osseous metastatic disease throughout the neck, chest, abdomen, and pelvis since prior exam. Index lesion in the left iliac wing currently has a maximum SUV of 9.4 on image 149 compared to 7.1 on prior exam. Another index lesion in the right ilium has a maximum SUV of 8.7 on image 149 compared to 6.9 on prior exam. The CT appearance of these mixed sclerotic and lytic bone metastases shows no significant change. This suggests hypermetabolic response to treatment, with progression of osseous metastatic disease considered less likely.  IMPRESSION: No evidence of soft tissue metastatic disease.  Increased hypermetabolic activity associated with diffuse osseous metastatic disease, although the CT appearance shows no significant change. This suggests hypermetabolic response to treatment, with progression of osseous metastatic disease considered less likely.   Electronically Signed   By: Earle Gell M.D.   On: 06/01/2013 12:19    ASSESSMENT: 63 y.o. [place] woman    (1)  status post Left breast and Left axillary lymph node biopsy Dec 2010 for a cT3 pN1 (stage IIIA) invasive ductal carcinoma, grade 2, estrogen and progesterone receptor positive, HER-2 negative, with an MIB-1 of 44%;   (2)  treated neoadjuvantly with dose dense cyclophosphamide and doxorubicin x4 followed by weekly paclitaxel x7  (3) s/p bilateral mastectomies May 2011. She had a residual 1.5 cm tumor, and one of 8 sampled lymph nodes was positive [ypT1c ypN1]. Margins were negative.   (4)  She completed post-mastectomy radiation in August of 2011   (5) on letrozole between August 2011 and October 2014  (6) PET scan 11/05/2012 obtained to evaluate right chest wall pain documents bony metastatic disease to the right seventh rib, L1, and the right acetabulum with an associated pathologic fracture through the anterior cortex of the acetabulum measuring 3.2 cm. There was no evidence of extra osseous metastatic disease.  (7)  as of 11/05/2012, receiving fulvestrant injections, 500 mg monthly, in addition to monthly infusions of zoledronic acid.  (8) Right rib, left hip and right hip were all treated to 37.5 Gy in 15 fractions at 2.5 Gy per fraction completed 12/17/2012  (9) 02/19/2013 bone marrow biopsy confirms metastatic disease to bone. HER-2 was not done because of concerns regarding because of fine the specimen. Estrogen and progesterone receptors were read as negative. This is discordant.  (10) patient's tumor carries a PIK3 mutation  (11) L ribcage pain: controlled on hydromorphone PRN; bowel prophylaxis effective; palliative XRT planned for May 14-27   PLAN:  I have discussed at situation with Dr. Genia Harold at [facility] and it is her feeling giving the new symptoms as well as the rise in markers that we are seeing progressions, even though we do  not see any new spots on the PET scan. In addition of course we are seeing more uptake than we would expect at this point: We interpreted the last  PET scan as showing a possible "treatment flare", but that would not be something we would see with this 1.  Accordingly we are stopping the fulvestrant. Her last dose was may 05/11/2013. The patient met with Dr. Pablo Ledger yesterday, and the plan is for 10 doses of radiation to the left side for pain control.  Dr. Lisbeth Renshaw suggested switching to capecitabine. They do not have a "capecitabine plus" study at [facility] right now, But there is one at [facility], paring capecitabine with AP by 3K inhibitor. I have another patient on that trial and she is doing very well. I discussed it with the patient and she is interested. I have e-mailed both Dr. Albertina Parr at [facility] and Dr.Modi at [facility] Re: pads possible participation. If everything goes as expected, possibly she could enroll late June, a month after completion of her radiation treatments  Brooke [name] continues to have moderately elevated liver function tests. I do not know the reason for this. The PET scan we just obtained shows no evidence of liver involvement. It is possible the fulvestrant is responsible and of course we have stopped that. If so it may take a couple months to normalize. I am going to check hepatitis serologies and repeat the lab work May 27.  That is a good understanding of the overall plan. She agrees with it. She knows the goal of treatment in her case is control. She will call with any problems that may develop before the next visit here.     Chauncey Cruel, MD 06/03/2013

## 2013-06-03 NOTE — Telephone Encounter (Signed)
per pof to sch lab-sch-pt stated will look on my chart as a reminder

## 2013-06-04 ENCOUNTER — Ambulatory Visit
Admission: RE | Admit: 2013-06-04 | Discharge: 2013-06-04 | Disposition: A | Discharge status: Home / Self Care | Payer: Medicare Other | Source: Ambulatory Visit | Attending: Radiation Oncology | Admitting: Radiation Oncology

## 2013-06-04 ENCOUNTER — Ambulatory Visit: Payer: Medicare Other

## 2013-06-04 ENCOUNTER — Other Ambulatory Visit: Payer: Medicare Other

## 2013-06-04 DIAGNOSIS — C7952 Secondary malignant neoplasm of bone marrow: Secondary | ICD-10-CM

## 2013-06-04 DIAGNOSIS — C50919 Malignant neoplasm of unspecified site of unspecified female breast: Secondary | ICD-10-CM

## 2013-06-04 DIAGNOSIS — C7951 Secondary malignant neoplasm of bone: Secondary | ICD-10-CM | POA: Insufficient documentation

## 2013-06-04 DIAGNOSIS — Z510 Encounter for antineoplastic radiation therapy: Secondary | ICD-10-CM | POA: Insufficient documentation

## 2013-06-04 NOTE — Progress Notes (Signed)
Name: ABYGAYLE DELTORO   MRN: [id number]  Date:  06/04/2013  DOB: [birth year]  Status:outpatient    DIAGNOSIS: Stage IV breast cancer metastatic to bone  CONSENT VERIFIED: yes   SET UP: Patient is setup supine   IMMOBILIZATION:  The following immobilization was used:wing board   NARRATIVE: Ms. Schuman was brought to the [place].  Identity was confirmed.  All relevant records and images related to the planned course of therapy were reviewed.  Then, the patient was positioned in a stable reproducible clinical set-up for radiation therapy.  BBs were placed on her prior tattoos. Double bbs were placed with the aid of the patient in the area of pain. CT images were obtained.  An isocenter was placed. Skin markings were placed.  The CT images were loaded into the planning software where the target and avoidance structures were contoured.  The radiation prescription was entered and confirmed. The patient was discharged in stable condition and tolerated simulation well.    TREATMENT PLANNING NOTE:  Treatment planning then occurred. I have requested : MLC's, isodose plan, basic dose calculation  3D simulation was performed. DVH are requested and analyzed of the lung, heart and gross tumor volume.  Special treatment procedure will be performed as she has been previously treated to her left chest wall.

## 2013-06-04 NOTE — Addendum Note (Signed)
Addended by: Laureen Abrahams on: 06/04/2013 06:19 PM   Modules accepted: Orders

## 2013-06-08 ENCOUNTER — Ambulatory Visit
Admission: RE | Admit: 2013-06-08 | Discharge: 2013-06-08 | Disposition: A | Discharge status: Home / Self Care | Payer: Medicare Other | Source: Ambulatory Visit | Attending: Radiation Oncology | Admitting: Radiation Oncology

## 2013-06-08 DIAGNOSIS — C7951 Secondary malignant neoplasm of bone: Principal | ICD-10-CM

## 2013-06-08 DIAGNOSIS — C50919 Malignant neoplasm of unspecified site of unspecified female breast: Secondary | ICD-10-CM

## 2013-06-09 ENCOUNTER — Telehealth: Payer: Self-pay | Admitting: *Deleted

## 2013-06-09 ENCOUNTER — Ambulatory Visit
Admission: RE | Admit: 2013-06-09 | Discharge: 2013-06-09 | Disposition: A | Discharge status: Home / Self Care | Payer: Medicare Other | Source: Ambulatory Visit | Attending: Radiation Oncology | Admitting: Radiation Oncology

## 2013-06-09 VITALS — BP 121/74 | HR 87 | Temp 99.0°F | Wt 177.8 lb

## 2013-06-09 DIAGNOSIS — C7951 Secondary malignant neoplasm of bone: Principal | ICD-10-CM

## 2013-06-09 DIAGNOSIS — C50919 Malignant neoplasm of unspecified site of unspecified female breast: Secondary | ICD-10-CM

## 2013-06-09 NOTE — Telephone Encounter (Signed)
Pt came by office after her radiation & wants to tell Dr Jana Hakim that she doesn't want to participate in the [facility] study & would like to know when she is to start xeloda.  Will discuss with Dr Jana Hakim.

## 2013-06-09 NOTE — Progress Notes (Signed)
Patient for weekly assessment of radiation to left ribs.Patient has had increased pain to another area lower than current treatment field.states she generally takes 3 tabs of dilaudid and she had to take 4 on yesterday.Question if radiation may be directed to second area of pain as well.

## 2013-06-09 NOTE — Progress Notes (Signed)
Weekly Management Note Current Dose: 6  Gy  Projected Dose: 30 Gy   Narrative:  The patient presents for routine under treatment assessment.  CBCT/MVCT images/Port film x-rays were reviewed.  The chart was checked. Pain inferior to treatment area. Dilaudid helping with pain. REgular bowel movements.  Physical Findings: Weight: 177 lb 12.8 oz (80.65 kg). Unchanged  Impression:  The patient is tolerating radiation.  Plan:  Continue treatment as planned. No abnormalities in area of concern. ? Constipation.

## 2013-06-10 ENCOUNTER — Ambulatory Visit
Admission: RE | Admit: 2013-06-10 | Discharge: 2013-06-10 | Disposition: A | Discharge status: Home / Self Care | Payer: Medicare Other | Source: Ambulatory Visit | Attending: Radiation Oncology | Admitting: Radiation Oncology

## 2013-06-11 ENCOUNTER — Ambulatory Visit
Admission: RE | Admit: 2013-06-11 | Discharge: 2013-06-11 | Disposition: A | Discharge status: Home / Self Care | Payer: Medicare Other | Source: Ambulatory Visit | Attending: Radiation Oncology | Admitting: Radiation Oncology

## 2013-06-12 ENCOUNTER — Other Ambulatory Visit: Payer: Self-pay | Admitting: *Deleted

## 2013-06-12 ENCOUNTER — Ambulatory Visit
Admission: RE | Admit: 2013-06-12 | Discharge: 2013-06-12 | Disposition: A | Discharge status: Home / Self Care | Payer: Medicare Other | Source: Ambulatory Visit | Attending: Radiation Oncology | Admitting: Radiation Oncology

## 2013-06-12 ENCOUNTER — Telehealth: Payer: Self-pay | Admitting: *Deleted

## 2013-06-12 DIAGNOSIS — C50919 Malignant neoplasm of unspecified site of unspecified female breast: Secondary | ICD-10-CM

## 2013-06-12 MED ORDER — CAPECITABINE 500 MG PO TABS: ORAL_TABLET | ORAL | Status: DC

## 2013-06-12 NOTE — Telephone Encounter (Signed)
Xeloda script originally sent to [facility] but pt called & asked that script be sent to [facility] [facility].  Script called to [phone] per pt & [facility] order cancelled.

## 2013-06-12 NOTE — Telephone Encounter (Signed)
Called pt per Dr. Virgie Dad written information & informed to start xeloda after radiation.  She states she will finish radiation 06/22/13.  Per Dr Jana Hakim pt will take xeloda 1.5 gm bid 7 days on & 7 days off & to start second week of June.  Script sent to [facility] outpt pharm per [facility].  Pt will also check with [employer] for cost comparison.

## 2013-06-16 ENCOUNTER — Ambulatory Visit
Admission: RE | Admit: 2013-06-16 | Discharge: 2013-06-16 | Disposition: A | Discharge status: Home / Self Care | Payer: Medicare Other | Source: Ambulatory Visit | Attending: Radiation Oncology | Admitting: Radiation Oncology

## 2013-06-16 ENCOUNTER — Telehealth: Payer: Self-pay | Admitting: *Deleted

## 2013-06-16 ENCOUNTER — Encounter: Payer: Self-pay | Admitting: Radiation Oncology

## 2013-06-16 VITALS — BP 136/74 | HR 72 | Temp 99.2°F | Resp 20 | Wt 178.3 lb

## 2013-06-16 DIAGNOSIS — C7951 Secondary malignant neoplasm of bone: Principal | ICD-10-CM

## 2013-06-16 DIAGNOSIS — C50919 Malignant neoplasm of unspecified site of unspecified female breast: Secondary | ICD-10-CM

## 2013-06-16 NOTE — Progress Notes (Signed)
Weekly Management Note Current Dose:  18 Gy  Projected Dose: 30 Gy   Narrative:  The patient presents for routine under treatment assessment.  CBCT/MVCT images/Port film x-rays were reviewed.  The chart was checked. Doing well. Pain controlled on 3-4 diluadid per day. Starts chemo next Sunday and feels comfortable with that decision. Pain is now only in her ilac and no pain in her rib.   Physical Findings: Weight: 178 lb 4.8 oz (80.876 kg). Unchanged  Impression:  The patient is tolerating radiation.  Plan:  Continue treatment as planned.Discussed treatment to her pelvis. She would prefer to start chemo.

## 2013-06-16 NOTE — Telephone Encounter (Signed)
Pt called this am & reports that she has talked with [facility] & they will deliver her xeloda tomorrow & the copay will be $35.

## 2013-06-16 NOTE — Telephone Encounter (Signed)
Late Entry:  Received message from [facility] stating that xeloda script would be $328 copay & mail order would be cheaper.  Script had already been sent to mail order pharm.

## 2013-06-16 NOTE — Progress Notes (Signed)
weekly rad txs left rib, 6/10 completed, no skin irritation stated by partient, pain 4-5 on 1-10 scale, took her dilaudid pain med as assessment was done, but pain in rib area better stated 1:26 PM

## 2013-06-17 ENCOUNTER — Other Ambulatory Visit (HOSPITAL_BASED_OUTPATIENT_CLINIC_OR_DEPARTMENT_OTHER): Payer: Medicare Other

## 2013-06-17 ENCOUNTER — Ambulatory Visit
Admission: RE | Admit: 2013-06-17 | Discharge: 2013-06-17 | Disposition: A | Discharge status: Home / Self Care | Payer: Medicare Other | Source: Ambulatory Visit | Attending: Radiation Oncology | Admitting: Radiation Oncology

## 2013-06-17 DIAGNOSIS — C50919 Malignant neoplasm of unspecified site of unspecified female breast: Secondary | ICD-10-CM

## 2013-06-17 LAB — CBC WITH DIFFERENTIAL/PLATELET
BASO%: 0.9 % (ref 0.0–2.0)
Basophils Absolute: 0 10*3/uL (ref 0.0–0.1)
EOS ABS: 0.2 10*3/uL (ref 0.0–0.5)
EOS%: 4.4 % (ref 0.0–7.0)
HCT: 38.8 % (ref 34.8–46.6)
HGB: 12.7 g/dL (ref 11.6–15.9)
LYMPH%: 28.6 % (ref 14.0–49.7)
MCH: 28.3 pg (ref 25.1–34.0)
MCHC: 32.7 g/dL (ref 31.5–36.0)
MCV: 86.4 fL (ref 79.5–101.0)
MONO#: 0.7 10*3/uL (ref 0.1–0.9)
MONO%: 12.7 % (ref 0.0–14.0)
NEUT%: 53.4 % (ref 38.4–76.8)
NEUTROS ABS: 2.8 10*3/uL (ref 1.5–6.5)
Platelets: 246 10*3/uL (ref 145–400)
RBC: 4.49 10*6/uL (ref 3.70–5.45)
RDW: 13.5 % (ref 11.2–14.5)
WBC: 5.2 10*3/uL (ref 3.9–10.3)
lymph#: 1.5 10*3/uL (ref 0.9–3.3)

## 2013-06-17 LAB — COMPREHENSIVE METABOLIC PANEL (CC13)
ALBUMIN: 3.8 g/dL (ref 3.5–5.0)
ALK PHOS: 127 U/L (ref 40–150)
ALT: 80 U/L — AB (ref 0–55)
AST: 65 U/L — ABNORMAL HIGH (ref 5–34)
Anion Gap: 12 mEq/L — ABNORMAL HIGH (ref 3–11)
BUN: 11.9 mg/dL (ref 7.0–26.0)
CO2: 25 mEq/L (ref 22–29)
Calcium: 10.7 mg/dL — ABNORMAL HIGH (ref 8.4–10.4)
Chloride: 105 mEq/L (ref 98–109)
Creatinine: 1 mg/dL (ref 0.6–1.1)
Glucose: 87 mg/dl (ref 70–140)
POTASSIUM: 4.8 meq/L (ref 3.5–5.1)
SODIUM: 142 meq/L (ref 136–145)
TOTAL PROTEIN: 7.1 g/dL (ref 6.4–8.3)
Total Bilirubin: 0.36 mg/dL (ref 0.20–1.20)

## 2013-06-17 LAB — HEPATITIS C ANTIBODY: HCV Ab: NEGATIVE

## 2013-06-17 LAB — HEPATITIS B SURFACE ANTIBODY,QUALITATIVE: Hep B S Ab: NEGATIVE

## 2013-06-17 LAB — HEPATITIS B CORE ANTIBODY, IGM: Hep B C IgM: NONREACTIVE

## 2013-06-18 ENCOUNTER — Ambulatory Visit
Admission: RE | Admit: 2013-06-18 | Discharge: 2013-06-18 | Disposition: A | Discharge status: Home / Self Care | Payer: Medicare Other | Source: Ambulatory Visit | Attending: Radiation Oncology | Admitting: Radiation Oncology

## 2013-06-19 ENCOUNTER — Other Ambulatory Visit: Payer: Self-pay | Admitting: *Deleted

## 2013-06-19 ENCOUNTER — Ambulatory Visit
Admission: RE | Admit: 2013-06-19 | Discharge: 2013-06-19 | Disposition: A | Discharge status: Home / Self Care | Payer: Medicare Other | Source: Ambulatory Visit | Attending: Radiation Oncology | Admitting: Radiation Oncology

## 2013-06-19 DIAGNOSIS — M25551 Pain in right hip: Secondary | ICD-10-CM

## 2013-06-19 DIAGNOSIS — R0781 Pleurodynia: Secondary | ICD-10-CM

## 2013-06-19 MED ORDER — HYDROMORPHONE HCL 2 MG PO TABS: 2.0000 mg | ORAL_TABLET | Freq: Four times a day (QID) | ORAL | Status: DC | PRN

## 2013-06-22 ENCOUNTER — Encounter: Payer: Self-pay | Admitting: Radiation Oncology

## 2013-06-22 ENCOUNTER — Ambulatory Visit: Payer: Medicare Other

## 2013-06-22 ENCOUNTER — Ambulatory Visit
Admission: RE | Admit: 2013-06-22 | Discharge: 2013-06-22 | Disposition: A | Discharge status: Home / Self Care | Payer: Medicare Other | Source: Ambulatory Visit | Attending: Radiation Oncology | Admitting: Radiation Oncology

## 2013-06-22 ENCOUNTER — Ambulatory Visit (HOSPITAL_BASED_OUTPATIENT_CLINIC_OR_DEPARTMENT_OTHER): Payer: Medicare Other

## 2013-06-22 ENCOUNTER — Other Ambulatory Visit (HOSPITAL_BASED_OUTPATIENT_CLINIC_OR_DEPARTMENT_OTHER): Payer: Medicare Other

## 2013-06-22 VITALS — BP 121/71 | HR 98 | Temp 97.1°F | Resp 18

## 2013-06-22 DIAGNOSIS — C7952 Secondary malignant neoplasm of bone marrow: Secondary | ICD-10-CM

## 2013-06-22 DIAGNOSIS — C7951 Secondary malignant neoplasm of bone: Secondary | ICD-10-CM

## 2013-06-22 DIAGNOSIS — C50919 Malignant neoplasm of unspecified site of unspecified female breast: Secondary | ICD-10-CM

## 2013-06-22 DIAGNOSIS — Z853 Personal history of malignant neoplasm of breast: Secondary | ICD-10-CM

## 2013-06-22 DIAGNOSIS — R7989 Other specified abnormal findings of blood chemistry: Secondary | ICD-10-CM

## 2013-06-22 DIAGNOSIS — C50519 Malignant neoplasm of lower-outer quadrant of unspecified female breast: Secondary | ICD-10-CM

## 2013-06-22 DIAGNOSIS — C50512 Malignant neoplasm of lower-outer quadrant of left female breast: Secondary | ICD-10-CM

## 2013-06-22 LAB — COMPREHENSIVE METABOLIC PANEL (CC13)
ALT: 110 U/L — ABNORMAL HIGH (ref 0–55)
ANION GAP: 13 meq/L — AB (ref 3–11)
AST: 89 U/L — ABNORMAL HIGH (ref 5–34)
Albumin: 3.4 g/dL — ABNORMAL LOW (ref 3.5–5.0)
Alkaline Phosphatase: 124 U/L (ref 40–150)
BUN: 15.3 mg/dL (ref 7.0–26.0)
CO2: 20 meq/L — AB (ref 22–29)
CREATININE: 1 mg/dL (ref 0.6–1.1)
Calcium: 9.9 mg/dL (ref 8.4–10.4)
Chloride: 105 mEq/L (ref 98–109)
GLUCOSE: 102 mg/dL (ref 70–140)
Potassium: 4.2 mEq/L (ref 3.5–5.1)
Sodium: 138 mEq/L (ref 136–145)
Total Bilirubin: 0.3 mg/dL (ref 0.20–1.20)
Total Protein: 6.8 g/dL (ref 6.4–8.3)

## 2013-06-22 LAB — CBC WITH DIFFERENTIAL/PLATELET
BASO%: 0.6 % (ref 0.0–2.0)
BASOS ABS: 0 10*3/uL (ref 0.0–0.1)
EOS ABS: 0.2 10*3/uL (ref 0.0–0.5)
EOS%: 4.6 % (ref 0.0–7.0)
HEMATOCRIT: 36.3 % (ref 34.8–46.6)
HEMOGLOBIN: 12 g/dL (ref 11.6–15.9)
LYMPH%: 20.3 % (ref 14.0–49.7)
MCH: 28.3 pg (ref 25.1–34.0)
MCHC: 33.1 g/dL (ref 31.5–36.0)
MCV: 85.6 fL (ref 79.5–101.0)
MONO#: 0.6 10*3/uL (ref 0.1–0.9)
MONO%: 12.3 % (ref 0.0–14.0)
NEUT%: 62.2 % (ref 38.4–76.8)
NEUTROS ABS: 3.1 10*3/uL (ref 1.5–6.5)
PLATELETS: 206 10*3/uL (ref 145–400)
RBC: 4.24 10*6/uL (ref 3.70–5.45)
RDW: 13.6 % (ref 11.2–14.5)
WBC: 5 10*3/uL (ref 3.9–10.3)
lymph#: 1 10*3/uL (ref 0.9–3.3)

## 2013-06-22 MED ORDER — SODIUM CHLORIDE 0.9 % IV SOLN
Freq: Once | INTRAVENOUS | Status: AC
  Administered 2013-06-22: 15:00:00 via INTRAVENOUS

## 2013-06-22 MED ORDER — HEPARIN SOD (PORK) LOCK FLUSH 100 UNIT/ML IV SOLN
500.0000 [IU] | Freq: Once | INTRAVENOUS | Status: DC | PRN
  Filled 2013-06-22: qty 5

## 2013-06-22 MED ORDER — SODIUM CHLORIDE 0.9 % IJ SOLN
10.0000 mL | Freq: Once | INTRAMUSCULAR | Status: DC
Start: 2013-06-22 — End: 2013-06-22
  Filled 2013-06-22: qty 10

## 2013-06-22 MED ORDER — ZOLEDRONIC ACID 4 MG/100ML IV SOLN
4.0000 mg | Freq: Once | INTRAVENOUS | Status: AC
  Administered 2013-06-22: 4 mg via INTRAVENOUS
  Filled 2013-06-22: qty 100

## 2013-06-22 NOTE — Patient Instructions (Signed)

## 2013-06-23 LAB — CANCER ANTIGEN 27.29: CA 27.29: 595 U/mL — ABNORMAL HIGH (ref 0–39)

## 2013-06-23 LAB — CEA: CEA: 124.1 ng/mL — ABNORMAL HIGH (ref 0.0–5.0)

## 2013-06-23 NOTE — Progress Notes (Signed)
  Radiation Oncology         [phone] ________________________________  Name: Brooke [name] MRN: [id number]  Date: 06/22/2013  DOB: May 17, 1950  End of Treatment Note  Diagnosis:   Stage IV Breast Cancer      Indication for treatment:  Palliative        Radiation treatment dates:   06/08/2013-06/22/2013  Site/dose:   Left Ribs  Beams/energy:   Opposed tangents with 6 MV photons.   Narrative: The patient tolerated radiation treatment relatively well.   She had resolution of her pain during treatment.   Plan: The patient has completed radiation treatment. The patient will return to radiation oncology clinic for routine followup in one month. I advised them to call or return sooner if they have any questions or concerns related to their recovery or treatment.  ------------------------------------------------  Thea Silversmith, MD

## 2013-06-29 ENCOUNTER — Telehealth: Payer: Self-pay

## 2013-06-29 ENCOUNTER — Ambulatory Visit (HOSPITAL_BASED_OUTPATIENT_CLINIC_OR_DEPARTMENT_OTHER): Payer: Medicare Other | Admitting: Oncology

## 2013-06-29 ENCOUNTER — Other Ambulatory Visit: Payer: Self-pay | Admitting: Oncology

## 2013-06-29 ENCOUNTER — Ambulatory Visit (HOSPITAL_COMMUNITY)
Admission: RE | Admit: 2013-06-29 | Discharge: 2013-06-29 | Disposition: A | Discharge status: Home / Self Care | Payer: Medicare Other | Source: Ambulatory Visit | Attending: Oncology | Admitting: Oncology

## 2013-06-29 VITALS — BP 130/84 | HR 88 | Temp 99.0°F | Resp 18 | Ht 64.0 in

## 2013-06-29 DIAGNOSIS — M25559 Pain in unspecified hip: Secondary | ICD-10-CM | POA: Insufficient documentation

## 2013-06-29 DIAGNOSIS — R229 Localized swelling, mass and lump, unspecified: Secondary | ICD-10-CM

## 2013-06-29 DIAGNOSIS — C7951 Secondary malignant neoplasm of bone: Secondary | ICD-10-CM | POA: Insufficient documentation

## 2013-06-29 DIAGNOSIS — M545 Low back pain, unspecified: Secondary | ICD-10-CM

## 2013-06-29 DIAGNOSIS — R079 Chest pain, unspecified: Secondary | ICD-10-CM

## 2013-06-29 DIAGNOSIS — C7952 Secondary malignant neoplasm of bone marrow: Secondary | ICD-10-CM

## 2013-06-29 DIAGNOSIS — C50519 Malignant neoplasm of lower-outer quadrant of unspecified female breast: Secondary | ICD-10-CM

## 2013-06-29 DIAGNOSIS — C773 Secondary and unspecified malignant neoplasm of axilla and upper limb lymph nodes: Secondary | ICD-10-CM

## 2013-06-29 DIAGNOSIS — Z853 Personal history of malignant neoplasm of breast: Secondary | ICD-10-CM | POA: Insufficient documentation

## 2013-06-29 DIAGNOSIS — C50919 Malignant neoplasm of unspecified site of unspecified female breast: Secondary | ICD-10-CM

## 2013-06-29 NOTE — Progress Notes (Signed)
ID: Brooke [name]   DOB: Jun 05, 1950  MR#: [id number]  ION#:[id number]  PCP: Irven Shelling, MD GYN: SU:  OTHER MD: Thea Silversmith, Gaynelle Arabian, [name] [phone]), Willa Rough DDS  CHIEF COMPLAINT: stage IV breast cancer CURRENT THERAPY: capecitabine  BREAST CANCER HISTORY: From the January 2011 summary:  [name] had screening mammography in August 04, 2007 which showed no specific mammographic evidence of malignancy, but she reported a left breast lump.  Accordingly, she was brought back on August 11, 2007 for mammography and ultrasonography.  There was indeed an obscured mass in the upper left breast which by ultrasound appeared to be a simple cyst.    Screening mammogram on August 04, 2008 showed in addition to dense breasts, again a possible mass in the left breast.  She had diagnostic mammography August 06, 2008 and additional views in addition to very dense breasts did not show any persistent mass or distortion.  Furthermore, Dr. Owens Shark was not able to palpate any abnormality in the lateral portion of the left breast.  Ultrasound showed normal appearing fibroglandular tissue in the area in question.    More recently, about September, the patient felt again something like a lump in the left breast and she had some pain associated with this.  This was initially felt simply to be mastalgia and was treated as such, but as it persisted, on December 23rd, the patient had left diagnostic mammography and ultrasonography.  There was a focally dense area in the left breast with very minimal distortion corresponding to the area of palpable abnormality. The ultrasound showed an irregular ill-defined, hypoechoic area with distal shadowing, measuring approximately 1 cm corresponding to the patient's palpable abnormality.    With this information, the patient was brought back for ultrasound-guided core biopsy and this was performed December 28th. 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  There was no evidence of angiolymphatic invasion in the material submitted which measured 1.5 cm maximally.  In addition to the mass in the breast, a lymph node in the left axilla was biopsied and was likewise positive.  The tumor was ER positive at 89%, PR positive at 34% with an elevated MIB-1 at 44% and Her-2 negative with a ratio of 1.13.    Her subsequent history is as detailed below.  INTERVAL HISTORY: [name] for an unscheduled visit accompanied by her husband [name]. For the past 2 or 3 days she is having worsening right lower back pain. There is a "bump" in her "rear". She can't walk. Today is day 3 cycle 1 of her capecitabine, which is causing her mild nausea. Since her last visit here she also was evaluated at [facility] and offered participation in a study combining capecitabine with a PI K-3 inhibitor, which she declined.  REVIEW OF SYSTEMS: Pat's pain was 9 or 10 this morning, and she was crying while examined. She found it difficult to stand up.  A detailed review of systems today was otherwise stable and in particular she is having good bowel movements despite the narcotics, thanks to her excellent bowel prophylaxis regimen  PAST MEDICAL HISTORY: Past Medical History  Diagnosis Date  . Hx Breast Cancer, IDC, Stahe III, receptor + 10/20/2010    left breast  . Breast cancer December 2010    invasive ductal and invasive lobular; bilateral mastectomy and radiation  . Mitral valve prolapse   . History of recurrent UTIs   . Radiation 07/26/09-09/08/09  left breast  . Fracture, pelvis closed 11/2012    both hipsand number 7 rib, 15 rounds radiation  1. Remote migraines, currently inactive.   2. History of prior diagnosis of fibromyalgia made at [facility] and currently not active (she was treated with trimethoprim and nortriptyline for one year for this).    3. History of recurrent UTIs.   4. History of  benign left breast cyst aspirated in 2004 at [facility] under Dr. Melene Plan.   5. History of tonsillectomy and adenoidectomy.   6. History of right LASEK surgery. 7. History of bilateral reduction mammoplasties in 1994.   8. History of mitral valve prolapse diagnosed in 1984.  PAST SURGICAL HISTORY: Past Surgical History  Procedure Laterality Date  . Mastectomy Bilateral 2011    left breast cancer  . Lasik    . Reduction mammaplasty Bilateral 1994  . Tonsillectomy and adenoidectomy    . Aspiration of left breast Left 2004    [facility]/Dr.Teo  . Bone biopsy  02/19/13    mets from breast cancer-receptors results not back    FAMILY HISTORY Family History  Problem Relation Age of Onset  . Lung cancer Father 40    smoker for 25 years  . Heart disease Maternal Grandmother   . Heart disease Maternal Grandfather   . Diabetes type I Mother   . Diabetes type I Sister   . Diabetes type I Sister   . Heart disease Maternal Uncle   . Uterine cancer Paternal Grandmother 56  . Breast cancer Other 31    paternal great grandmother  [name] died at the age of 78 from lung cancer.  He had been a remote smoker and had been in [employer] with a question of possible asbestos exposure.  [name] died at the age of 39 in the setting of Alzheimer's, coronary artery disease, diabetes and hypertension.  The patient had two sisters.  One died from complications of diabetes.  The other one is alive with diabetes.    GYNECOLOGIC HISTORY: She is GX P1.  First pregnancy to term at age 81.  The patient's last period was in 2008.  She took hormone replacement for about 18 months until mid-2008.  She had no complications from that treatment.    SOCIAL HISTORY: (Updated October 2014) She used to be a Arts development officer for [employer]  They used to live in the [place] area. 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  Her  husband, [name], retired October 2012.  He was a Arts development officer for 28 years.  Their son, [name], is currently with the [employer] at the [employer] in [place]. [name].  She attends [employer].   ADVANCED DIRECTIVES: in place  HEALTH MAINTENANCE: (Updated 11/19/2012) History  Substance Use Topics  . Smoking status: Former Smoker -- 1.00 packs/day for 11 years    Quit date: 01/23/1984  . Smokeless tobacco: Never Used  . Alcohol Use: 1.5 - 2.5 oz/week    3-5 drink(s) per week     Comment: glasses of wine     Colonoscopy: 2010  PAP: Jan 2014  Bone density: July 2014, osteopenia  Lipid panel: per Dr Valentina Lucks  Allergies  Allergen Reactions  . Sulfa Antibiotics Shortness Of Breath  . Codeine Nausea And Vomiting    Tolerates oxycodone if given with prochlorperazine for nausea  . Morphine And  Related Nausea And Vomiting    Per patient possibility that the IR morphine was causing the vomiting   Scheduled Meds: Continuous Infusions: PRN Meds:.   OBJECTIVE: Middle-aged white woman in severe distress  Filed Vitals:   06/29/13 1143  BP: 130/84  Pulse: 88  Temp: 99 F (37.2 C)  Resp: 18   she had good flexion and extension as well as dorsiflexion in both lower extremities, 5 over 5. There was no sensory level. There was a palpable subcutaneous mass in the posterior right buttock measuring approximately 2 cm. There was no erythema or swelling associated with this. It was tender to palpation, however. There was no lower extremity edema.  LAB RESULTS:  Lab Results  Component Value Date   WBC 5.0 06/22/2013   NEUTROABS 3.1 06/22/2013   HGB 12.0 06/22/2013   HCT 36.3 06/22/2013   MCV 85.6 06/22/2013   PLT 206 06/22/2013      Chemistry      Component Value Date/Time   NA 138 06/22/2013 1428   NA 141 03/29/2013 0350   NA 142 06/26/2011 0728   K 4.2 06/22/2013 1428   K 3.7 03/29/2013 0350   K 4.6 06/26/2011 0728   CL 111 03/29/2013 0350   CL 105  01/07/2012 1347   CL 102 06/26/2011 0728   CO2 20* 06/22/2013 1428   CO2 18* 03/29/2013 0350   CO2 30 06/26/2011 0728   BUN 15.3 06/22/2013 1428   BUN 10 03/29/2013 0350   BUN 12 06/26/2011 0728   CREATININE 1.0 06/22/2013 1428   CREATININE 0.72 03/29/2013 0350   CREATININE 1.1 06/26/2011 0728      Component Value Date/Time   CALCIUM 9.9 06/22/2013 1428   CALCIUM 7.8* 03/29/2013 0350   CALCIUM 9.1 06/26/2011 0728   ALKPHOS 124 06/22/2013 1428   ALKPHOS 91 04/30/2013 1301   AST 89* 06/22/2013 1428   AST 67* 04/30/2013 1301   ALT 110* 06/22/2013 1428   ALT 93* 04/30/2013 1301   BILITOT 0.30 06/22/2013 1428   BILITOT 0.4 04/30/2013 1301       Lab Results  Component Value Date   LABCA2 595* 06/22/2013    STUDIES: Nm Pet Image Restag (ps) Skull Base To Thigh  06/01/2013   CLINICAL DATA:  Subsequent treatment strategy for metastatic breast carcinoma.  EXAM: NUCLEAR MEDICINE PET SKULL BASE TO THIGH  TECHNIQUE: 9.4 mCi F-18 FDG was injected intravenously. Full-ring PET imaging was performed from the skull base to thigh after the radiotracer. CT data was obtained and used for attenuation correction and anatomic localization.  FASTING BLOOD GLUCOSE:  Value: 96 mg/dl  COMPARISON:  03/23/2013  FINDINGS: NECK  No hypermetabolic lymph nodes in the neck.  CHEST  No hypermetabolic mediastinal or hilar nodes. No suspicious pulmonary nodules on the CT scan.  ABDOMEN/PELVIS  No abnormal hypermetabolic activity within the liver, pancreas, adrenal glands, or spleen. No hypermetabolic lymph nodes in the abdomen or pelvis.  SKELETON  There has been significant increase in hypermetabolic activity associated with diffuse osseous metastatic disease throughout the neck, chest, abdomen, and pelvis since prior exam. Index lesion in the left iliac wing currently has a maximum SUV of 9.4 on image 149 compared to 7.1 on prior exam. Another index lesion in the right ilium has a maximum SUV of 8.7 on image 149 compared to 6.9 on prior exam. The CT appearance  of these mixed sclerotic and lytic bone metastases shows no significant change. This suggests hypermetabolic response to treatment,  with progression of osseous metastatic disease considered less likely.  IMPRESSION: No evidence of soft tissue metastatic disease.  Increased hypermetabolic activity associated with diffuse osseous metastatic disease, although the CT appearance shows no significant change. This suggests hypermetabolic response to treatment, with progression of osseous metastatic disease considered less likely.   Electronically Signed   By: Earle Gell M.D.   On: 06/01/2013 12:19   Dg Hip Complete Right  06/29/2013   CLINICAL DATA:  New onset of pain. History of breast cancer metastatic to bone. New onset of right hip pain radiating to tailbone for 3 days. No known injury.  EXAM: RIGHT HIP - COMPLETE 2+ VIEW  COMPARISON:  Pelvis and hip MRI 03/23/2013 and PET-CT 06/01/2013  FINDINGS: There are patchy areas of sclerosis in the pelvis, involving both ischia, the left iliac bone, near the left sacroiliac joint, and the right iliac bone. These correspond to sclerotic metastases seen on recent PET-CT. Please note that the overall number of sclerotic metastases is underestimated on plain radiographs. The right hip is located. There are no significant degenerative changes of the hips.  No acute fracture of the pelvis or proximal right femur is identified. There are degenerative changes of the lower lumbar spine. Known metastatic disease in the lower lumbar spine is best demonstrated on recent PET-CT.  IMPRESSION: Bony metastatic disease. No acute superimposed bony abnormality identified.   Electronically Signed   By: Curlene Dolphin M.D.   On: 06/29/2013 12:40    ASSESSMENT: 63 y.o. [place] woman   (1)  status post Left breast and Left axillary lymph node biopsy Dec 2010 for a cT3 pN1 (stage IIIA) invasive ductal carcinoma, grade 2, estrogen and progesterone receptor positive, HER-2 negative, with an  MIB-1 of 44%;   (2)  treated neoadjuvantly with dose dense cyclophosphamide and doxorubicin x4 followed by weekly paclitaxel x7  (3) s/p bilateral mastectomies May 2011. She had a residual 1.5 cm tumor, and one of 8 sampled lymph nodes was positive [ypT1c ypN1]. Margins were negative.   (4)  She completed post-mastectomy radiation in August of 2011   (5) on letrozole between August 2011 and October 2014  (6) PET scan 11/05/2012 obtained to evaluate right chest wall pain documents bony metastatic disease to the right seventh rib, L1, and the right acetabulum with an associated pathologic fracture through the anterior cortex of the acetabulum measuring 3.2 cm. There was no evidence of extra osseous metastatic disease.  (7) starting 11/05/2012 a received fulvestrant, 500 mg monthly, in addition to monthly infusions of zoledronic acid. The fulvestrant was discontinued May 2015 with evidence of progression  (8) Right rib, left hip and right hip were all treated to 37.5 Gy in 15 fractions at 2.5 Gy per fraction completed 12/17/2012  (9) 02/19/2013 bone marrow biopsy confirms metastatic disease to bone. HER-2 was not done because of concerns regarding because of fine the specimen. Estrogen and progesterone receptors were read as negative. This i was felt to be possibly discordant.  (10) patient's tumor carries a PIK3 mutation as does dead at [facility]  (11) L ribcage pain: s/p palliative XRT completed 06/16/2013  (12) declined participation in a capecitabine plus PIK 3 inhibitor at [facility]; started capecitabine alone 06/27/2013   PLAN:  I have a hard time explaining Pat's pain. I sent her for plain films of the right hip area and date did not show any evidence of fracture. Certainly there is bone involvement by tumor in that area. I did  ask her to take additional hydromorphone and that brought the pain down from about 10 to about 5. I offered her admission but the last  thing in the world she wants, she says, is going into the hospital.   Sitting on the subcutaneous mass in the right buttock is very uncomfortable for her, and at a minimum I think this likely could receive a brief course of radiation, perhaps a single dose. I have asked radiation oncology to arrange a visit with Dr. Pablo Ledger tomorrow.  In the meantime [name] is going to increase her Dilaudid to 2 tablets (4 mg) 4 times a day. She understands it is okay to take additional tablets. I did ask her to keep a record of what she takes in case we want to start her on a longer acting agents. She will continue her current bowel prophylaxis.   She only just started the capecitabine, and has had a little bit of nausea with that. I suggested she take Compazine before each dose. She already has an appointment with me later this week. She knows to call for any problems that may develop before her next visit here.     Chauncey Cruel, MD 06/29/2013

## 2013-06-29 NOTE — Telephone Encounter (Signed)
Received call from [place] with Dr.Magrinat.In basket sent to Dr.Wentworth to see if she wants add on for follow up and ct simulation or simulation only on 6/9/156 in regards to new onset right hip pain.See DG right hip from 06/29/13.I will call patient with appointment.

## 2013-06-30 ENCOUNTER — Ambulatory Visit
Admission: RE | Admit: 2013-06-30 | Discharge: 2013-06-30 | Disposition: A | Discharge status: Home / Self Care | Payer: Medicare Other | Source: Ambulatory Visit | Attending: Radiation Oncology | Admitting: Radiation Oncology

## 2013-06-30 ENCOUNTER — Encounter: Payer: Self-pay | Admitting: Radiation Oncology

## 2013-06-30 ENCOUNTER — Inpatient Hospital Stay
Admission: RE | Admit: 2013-06-30 | Discharge: 2013-06-30 | Disposition: A | Discharge status: Home / Self Care | Payer: Medicare Other | Source: Ambulatory Visit | Attending: Radiation Oncology | Admitting: Radiation Oncology

## 2013-06-30 VITALS — BP 103/71 | HR 92 | Temp 98.8°F | Resp 12

## 2013-06-30 DIAGNOSIS — C50519 Malignant neoplasm of lower-outer quadrant of unspecified female breast: Secondary | ICD-10-CM | POA: Insufficient documentation

## 2013-06-30 DIAGNOSIS — C7951 Secondary malignant neoplasm of bone: Secondary | ICD-10-CM | POA: Insufficient documentation

## 2013-06-30 DIAGNOSIS — C50919 Malignant neoplasm of unspecified site of unspecified female breast: Secondary | ICD-10-CM

## 2013-06-30 DIAGNOSIS — Z510 Encounter for antineoplastic radiation therapy: Secondary | ICD-10-CM | POA: Diagnosis not present

## 2013-06-30 DIAGNOSIS — C7952 Secondary malignant neoplasm of bone marrow: Secondary | ICD-10-CM

## 2013-06-30 DIAGNOSIS — C50512 Malignant neoplasm of lower-outer quadrant of left female breast: Secondary | ICD-10-CM

## 2013-06-30 MED ORDER — BIAFINE EX EMUL: CUTANEOUS | Status: DC | PRN

## 2013-06-30 NOTE — Progress Notes (Signed)
Name: NERA HAWORTH   MRN: [id number]  Date:  06/30/2013  DOB: 12/18/1950  Status:outpatient    DIAGNOSIS: Metastatic breast cancer to bone  CONSENT VERIFIED: yes   SET UP: Patient is setup supine   IMMOBILIZATION:  The following immobilization was used: Alpha cradle  NARRATIVE:  Pt Schaafsma was brought to the CT Simulation planning suite.  Identity was confirmed.  All relevant records and images related to the planned course of therapy were reviewed.  Then, the patient was positioned in a stable reproducible clinical set-up for radiation therapy.  CT images were obtained.  An isocenter was placed. Skin markings were placed.  The CT images were loaded into the planning software where the target and avoidance structures were contoured.  The radiation prescription was entered and confirmed. The patient was discharged in stable condition and tolerated simulation well.    TREATMENT PLANNING NOTE:  Treatment planning then occurred. I have requested : MLC's, isodose plan, basic dose calculation  I have requested 3 dimensional simulation with DVH of cord, bowel and GTV  Special treatment procedure will be performed as she as received previous radiation to the right hip just below this field.

## 2013-06-30 NOTE — Progress Notes (Addendum)
Department of Radiation Oncology  Phone:  [phone] Fax:        [phone]   Name: Brooke [name] MRN: [id number]  DOB: 1950-02-12  Date: 06/30/2013  Follow Up Visit Note  Diagnosis: Metastatic (Stage IV) breast cancer   Summary and Interval since last radiation: 60.4 Gy to the left chest wall completed 08/2009. 37.5 gy in 15 fractions to the bilateral hips and right ribs completed 12/17/12. RT to the left ribs (retreat over prior chest wall tx) completed 06/22/13  Interval History: Brooke [name] presents today for followup. She relates a history of walking in the mall and then truning when she felt a crack in Brooke right hip. After that she could not walk and has excruciating pain when she bears weight. She has the most pain if she puts weight on Brooke right leg. She has no pain when she lays on Brooke left side. The pain radiates up Brooke back and down Brooke lateral thigh. A plain film yesterday was negative for fracture. She has doubled up on Brooke Dilaudid and that has somewhat controlled Brooke pain. She is having some nausea and diarrhea with the Xeloda.   Allergies:  Allergies  Allergen Reactions  . Sulfa Antibiotics Shortness Of Breath  . Codeine Nausea And Vomiting    Tolerates oxycodone if given with prochlorperazine for nausea  . Morphine And Related Nausea And Vomiting    Per patient possibility that the IR morphine was causing the vomiting    Medications:  Current Outpatient Prescriptions  Medication Sig Dispense Refill  . ALPRAZolam (XANAX XR) 1 MG 24 hr tablet Take 1 mg by mouth. Up to twice daily as needed for anxiety      . Calcium Citrate (CITRACAL PO) Take 3 tablets by mouth daily.      . capecitabine (XELODA) 500 MG tablet Take 3 tabs twice daily with food 7 days on & 7 days off starting 2nd week of June  84 tablet  0  . cholecalciferol (VITAMIN D) 400 UNITS TABS tablet Take 400 Units by mouth daily. Patient reportd the Vitamin E should be vitamin D with same dose      .  diphenhydrAMINE (BENADRYL) 25 mg capsule Take 25 mg by mouth at bedtime as needed for sleep.       [name] Calcium (STOOL SOFTENER PO) Take by mouth daily.      [name] Kitchen gabapentin (NEURONTIN) 300 MG capsule Take 300 mg by mouth at bedtime.       [name] Kitchen HYDROmorphone (DILAUDID) 2 MG tablet Take 2 mg by mouth every 6 (six) hours as needed for moderate pain or severe pain (Pt is taking 4mg  (two, 2mg  tabs) at each dose).      Vladimir Faster Glycol-Propyl Glycol (SYSTANE) 0.4-0.3 % SOLN Apply 1 drop to eye 2 (two) times daily as needed (dry eyes).      . Polyethylene Glycol 3350 (MIRALAX PO) Take by mouth daily.      . prochlorperazine (COMPAZINE) 5 MG tablet Take 5 mg by mouth 3 (three) times daily.      . Vaginal Lubricant (REPLENS VA) Place vaginally. Combining with estroglide      . venlafaxine XR (EFFEXOR-XR) 37.5 MG 24 hr capsule TAKE 2 CAPSULES DAILY  180 capsule  1  . Zoledronic Acid (ZOMETA IV) Inject into the vein every 30 (thirty) days.      . clobetasol cream (TEMOVATE) 9.52 % Apply 1 application topically 2 (two) times daily.  30 g  0  No current facility-administered medications for this encounter.    Physical Exam:  Filed Vitals:   06/30/13 1400  BP: 103/71  Pulse: 92  Temp: 98.8 F (37.1 C)  TempSrc: Oral  Resp: 12  SpO2: 96%  Tenderness in the right sacroiliac joint. There is a protruding area on the right deep to the muscle which is not present on the left.   IMPRESSION: Brooke [name] is a 63 y.o. female with new right sacral pain  PLAN:  I spoke to Brooke [name] and Brooke [name]. I've gone over Brooke PET scan with radiology. There is small disease on the right SI joint but nothing major and nothing with cortical breakthrough.  Radiology discussed MRI and/or a bone scan for target delineation. I feel like the MRI would let us know if she had a fracture but I'm not sure my clinical management will change. Bone scan would light up in this area whether she had a fracture or metastatic disease. I've  gone ahead and recommended empiric treatment of Brooke right sacrum/lower lumbar spine with RT for palliative pain purposes. She cannot tolerate an MRI with the amount of pain she is in and she does have documented metastatic disease there. We discussed she could have some nausea and diarrhea.  She signed informed consent.  We will try to get this started tomorrow. Seh will continue Brooke current pain medications.    Thea Silversmith, MD

## 2013-06-30 NOTE — Progress Notes (Addendum)
Pt here for follow up for right hip and buttock pain. Dr. Doris Cheadle increased her dilaudid to 4mg /qid.  Rating pain a 10 on a scale of 0-10 when she is not taking pain meds. Pain meds lower it to a 6.  She is a lot of pain to ambulate and has been immobile.

## 2013-07-01 ENCOUNTER — Ambulatory Visit
Admission: RE | Admit: 2013-07-01 | Discharge: 2013-07-01 | Disposition: A | Discharge status: Home / Self Care | Payer: Medicare Other | Source: Ambulatory Visit | Attending: Radiation Oncology | Admitting: Radiation Oncology

## 2013-07-01 DIAGNOSIS — C7951 Secondary malignant neoplasm of bone: Principal | ICD-10-CM

## 2013-07-01 DIAGNOSIS — C50919 Malignant neoplasm of unspecified site of unspecified female breast: Secondary | ICD-10-CM

## 2013-07-01 DIAGNOSIS — Z510 Encounter for antineoplastic radiation therapy: Secondary | ICD-10-CM | POA: Diagnosis not present

## 2013-07-01 NOTE — Progress Notes (Signed)
  Radiation Oncology         [phone] ________________________________  Name: Brooke [name] MRN: [id number]  Date: 07/01/2013  DOB: Oct 14, 1950  Simulation Verification Note  Status: outpatient  NARRATIVE: The patient was brought to the treatment unit and placed in the planned treatment position. The clinical setup was verified. Then port films were obtained and uploaded to the radiation oncology medical record software.  The treatment beams were carefully compared against the planned radiation fields. The position location and shape of the radiation fields was reviewed. The targeted volume of tissue appears appropriately covered by the radiation beams. Organs at risk appear to be excluded as planned.  Based on my personal review, I approved the simulation verification. The patient's treatment will proceed as planned.  ------------------------------------------------  Thea Silversmith, MD

## 2013-07-02 ENCOUNTER — Other Ambulatory Visit (HOSPITAL_BASED_OUTPATIENT_CLINIC_OR_DEPARTMENT_OTHER): Payer: Medicare Other

## 2013-07-02 ENCOUNTER — Telehealth: Payer: Self-pay | Admitting: *Deleted

## 2013-07-02 ENCOUNTER — Ambulatory Visit (HOSPITAL_BASED_OUTPATIENT_CLINIC_OR_DEPARTMENT_OTHER): Payer: Medicare Other | Admitting: Oncology

## 2013-07-02 ENCOUNTER — Ambulatory Visit
Admission: RE | Admit: 2013-07-02 | Discharge: 2013-07-02 | Disposition: A | Discharge status: Home / Self Care | Payer: Medicare Other | Source: Ambulatory Visit | Attending: Radiation Oncology | Admitting: Radiation Oncology

## 2013-07-02 VITALS — BP 108/74 | HR 84 | Temp 98.2°F | Resp 18

## 2013-07-02 DIAGNOSIS — R079 Chest pain, unspecified: Secondary | ICD-10-CM

## 2013-07-02 DIAGNOSIS — C50919 Malignant neoplasm of unspecified site of unspecified female breast: Secondary | ICD-10-CM

## 2013-07-02 DIAGNOSIS — C773 Secondary and unspecified malignant neoplasm of axilla and upper limb lymph nodes: Secondary | ICD-10-CM

## 2013-07-02 DIAGNOSIS — M79603 Pain in arm, unspecified: Secondary | ICD-10-CM

## 2013-07-02 DIAGNOSIS — R110 Nausea: Secondary | ICD-10-CM

## 2013-07-02 DIAGNOSIS — Z510 Encounter for antineoplastic radiation therapy: Secondary | ICD-10-CM | POA: Diagnosis not present

## 2013-07-02 DIAGNOSIS — C7951 Secondary malignant neoplasm of bone: Secondary | ICD-10-CM

## 2013-07-02 DIAGNOSIS — C50512 Malignant neoplasm of lower-outer quadrant of left female breast: Secondary | ICD-10-CM

## 2013-07-02 DIAGNOSIS — M25551 Pain in right hip: Secondary | ICD-10-CM

## 2013-07-02 DIAGNOSIS — C7952 Secondary malignant neoplasm of bone marrow: Secondary | ICD-10-CM

## 2013-07-02 DIAGNOSIS — R0781 Pleurodynia: Secondary | ICD-10-CM

## 2013-07-02 DIAGNOSIS — Z170 Estrogen receptor positive status [ER+]: Secondary | ICD-10-CM

## 2013-07-02 LAB — CBC WITH DIFFERENTIAL/PLATELET
BASO%: 0.9 % (ref 0.0–2.0)
Basophils Absolute: 0 10*3/uL (ref 0.0–0.1)
EOS%: 0.5 % (ref 0.0–7.0)
Eosinophils Absolute: 0 10*3/uL (ref 0.0–0.5)
HEMATOCRIT: 38 % (ref 34.8–46.6)
HGB: 12.5 g/dL (ref 11.6–15.9)
LYMPH#: 0.7 10*3/uL — AB (ref 0.9–3.3)
LYMPH%: 19 % (ref 14.0–49.7)
MCH: 28.4 pg (ref 25.1–34.0)
MCHC: 32.9 g/dL (ref 31.5–36.0)
MCV: 86.2 fL (ref 79.5–101.0)
MONO#: 0.5 10*3/uL (ref 0.1–0.9)
MONO%: 12.4 % (ref 0.0–14.0)
NEUT#: 2.7 10*3/uL (ref 1.5–6.5)
NEUT%: 67.2 % (ref 38.4–76.8)
Platelets: 184 10*3/uL (ref 145–400)
RBC: 4.42 10*6/uL (ref 3.70–5.45)
RDW: 13.8 % (ref 11.2–14.5)
WBC: 3.9 10*3/uL (ref 3.9–10.3)

## 2013-07-02 NOTE — Progress Notes (Signed)
ID: Brooke [name]   DOB: 29-Jan-1950  MR#: [id number]  ZJQ#:[id number]  PCP: Irven Shelling, MD GYN: SU:  OTHER MD: Thea Silversmith, Gaynelle Arabian, [name] [phone]), Willa Rough DDS  CHIEF COMPLAINT: stage IV breast cancer CURRENT THERAPY: capecitabine  BREAST CANCER HISTORY: From the January 2011 summary:  Brooke [name] had screening mammography in August 04, 2007 which showed no specific mammographic evidence of malignancy, but she reported a left breast lump.  Accordingly, she was brought back on August 11, 2007 for mammography and ultrasonography.  There was indeed an obscured mass in the upper left breast which by ultrasound appeared to be a simple cyst.    Screening mammogram on August 04, 2008 showed in addition to dense breasts, again a possible mass in the left breast.  She had diagnostic mammography August 06, 2008 and additional views in addition to very dense breasts did not show any persistent mass or distortion.  Furthermore, Dr. Owens Shark was not able to palpate any abnormality in the lateral portion of the left breast.  Ultrasound showed normal appearing fibroglandular tissue in the area in question.    More recently, about September, the patient felt again something like a lump in the left breast and she had some pain associated with this.  This was initially felt simply to be mastalgia and was treated as such, but as it persisted, on December 23rd, the patient had left diagnostic mammography and ultrasonography.  There was a focally dense area in the left breast with very minimal distortion corresponding to the area of palpable abnormality. The ultrasound showed an irregular ill-defined, hypoechoic area with distal shadowing, measuring approximately 1 cm corresponding to the patient's palpable abnormality.    With this information, the patient was brought back for ultrasound-guided core biopsy and this was performed December 28th. 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  There was no evidence of angiolymphatic invasion in the material submitted which measured 1.5 cm maximally.  In addition to the mass in the breast, a lymph node in the left axilla was biopsied and was likewise positive.  The tumor was ER positive at 89%, PR positive at 34% with an elevated MIB-1 at 44% and Her-2 negative with a ratio of 1.13.    Her subsequent history is as detailed below.  INTERVAL HISTORY: Brooke [name] returns for followup of her metastatic breast cancer accompanied by her husband [name]. Today is day 5 cycle 1 of capecitabine which he receives 7 days on and 7 days off. She is tolerating it well. Since her last visit here she also met with Dr. Pablo Ledger, and has been started on radiation to the right sacrum and lower spine.  REVIEW OF SYSTEMS: Brooke [name] is having some nausea from the capecitabine. This is well controlled on prochlorperazine taken before each dose. There has been no vomiting. Her pain continues to be 7-9 as she sits on a pillow in the examination room. She try doubling up on the dialogue that, and that's related control the pain but it made her very sleepy and she did not like the way she felt. She would rather "put up with more pain". However what this means is that she is sitting and refusing to move because moving makes the pain worse. In addition, she is eating a little bit more because food makes the nausea feel better. She is going to gain a lot of weight if she does not change this pattern. She has had  no fevers, no rash, and no mouth sores. She has not had any diarrhea and also no constipation from the narcotics. She continues on MiraLAX and stool softeners as before. She continues to have numbness in her left big toe. Otherwise a review of systems today was stable  PAST MEDICAL HISTORY: Past Medical History  Diagnosis Date  . Hx Breast Cancer, IDC, Stahe III, receptor + 10/20/2010    left breast  . Breast cancer  December 2010    invasive ductal and invasive lobular; bilateral mastectomy and radiation  . Mitral valve prolapse   . History of recurrent UTIs   . Radiation 07/26/09-09/08/09    left breast  . Fracture, pelvis closed 11/2012    both hipsand number 7 rib, 15 rounds radiation  1. Remote migraines, currently inactive.   2. History of prior diagnosis of fibromyalgia made at [facility] and currently not active (she was treated with trimethoprim and nortriptyline for one year for this).    3. History of recurrent UTIs.   4. History of benign left breast cyst aspirated in 2004 at [facility] under Dr. Melene Plan.   5. History of tonsillectomy and adenoidectomy.   6. History of right LASEK surgery. 7. History of bilateral reduction mammoplasties in 1994.   8. History of mitral valve prolapse diagnosed in 1984.  PAST SURGICAL HISTORY: Past Surgical History  Procedure Laterality Date  . Mastectomy Bilateral 2011    left breast cancer  . Lasik    . Reduction mammaplasty Bilateral 1994  . Tonsillectomy and adenoidectomy    . Aspiration of left breast Left 2004    [facility]/Dr.Teo  . Bone biopsy  02/19/13    mets from breast cancer-receptors results not back    FAMILY HISTORY Family History  Problem Relation Age of Onset  . Lung cancer Father 26    smoker for 25 years  . Heart disease Maternal Grandmother   . Heart disease Maternal Grandfather   . Diabetes type I Mother   . Diabetes type I Sister   . Diabetes type I Sister   . Heart disease Maternal Uncle   . Uterine cancer Paternal Grandmother 19  . Breast cancer Other 56    paternal great grandmother  [name] died at the age of 40 from lung cancer.  He had been a remote smoker and had been in [employer] with a question of possible asbestos exposure.  [name] died at the age of 20 in the setting of Alzheimer's, coronary artery disease, diabetes and hypertension.  The  patient had two sisters.  One died from complications of diabetes.  The other one is alive with diabetes.    GYNECOLOGIC HISTORY: She is GX P1.  First pregnancy to term at age 29.  The patient's last period was in 2008.  She took hormone replacement for about 18 months until mid-2008.  She had no complications from that treatment.    SOCIAL HISTORY: (Updated October 2014) She used to be a Arts development officer for [employer]  They used to live in the [place] area. 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  Her husband, [name], retired October 2012.  He was a Arts development officer for 28 years.  Their son, [name], is currently with the [employer] at the [employer] in [place]. [name].  She attends [employer].   ADVANCED DIRECTIVES: in place  HEALTH MAINTENANCE: (Updated 11/19/2012) History  Substance Use Topics  . Smoking status: Former Smoker -- 1.00 packs/day for 11 years    Quit date: 01/23/1984  . Smokeless tobacco: Never Used  . Alcohol Use: 1.5 - 2.5 oz/week    3-5 drink(s) per week     Comment: glasses of wine     Colonoscopy: 2010  PAP: Jan 2014  Bone density: July 2014, osteopenia  Lipid panel: per Dr Valentina Lucks  Allergies  Allergen Reactions  . Sulfa Antibiotics Shortness Of Breath  . Codeine Nausea And Vomiting    Tolerates oxycodone if given with prochlorperazine for nausea  . Morphine And Related Nausea And Vomiting    Per patient possibility that the IR morphine was causing the vomiting   Scheduled Meds: Continuous Infusions: PRN Meds:.   OBJECTIVE: Middle-aged white woman who appears stated age  54 Vitals:   07/02/13 1247  BP: 108/74  Pulse: 84  Temp: 98.2 F (36.8 C)  Resp: 18   Sclerae unicteric, pupils equal and reactive Oropharynx clear and moist-- no thrush or other lesions No cervical or supraclavicular adenopathy Lungs no rales or rhonchi Heart regular rate and  rhythm Abd soft, nontender, positive bowel sounds MSK no focal spinal tenderness to moderate percussion, no upper extremity lymphedema Neuro: nonfocal, well oriented, appropriate affect Breasts: Deferred   LAB RESULTS:  Lab Results  Component Value Date   WBC 3.9 07/02/2013   NEUTROABS 2.7 07/02/2013   HGB 12.5 07/02/2013   HCT 38.0 07/02/2013   MCV 86.2 07/02/2013   PLT 184 07/02/2013      Chemistry      Component Value Date/Time   NA 138 06/22/2013 1428   NA 141 03/29/2013 0350   NA 142 06/26/2011 0728   K 4.2 06/22/2013 1428   K 3.7 03/29/2013 0350   K 4.6 06/26/2011 0728   CL 111 03/29/2013 0350   CL 105 01/07/2012 1347   CL 102 06/26/2011 0728   CO2 20* 06/22/2013 1428   CO2 18* 03/29/2013 0350   CO2 30 06/26/2011 0728   BUN 15.3 06/22/2013 1428   BUN 10 03/29/2013 0350   BUN 12 06/26/2011 0728   CREATININE 1.0 06/22/2013 1428   CREATININE 0.72 03/29/2013 0350   CREATININE 1.1 06/26/2011 0728      Component Value Date/Time   CALCIUM 9.9 06/22/2013 1428   CALCIUM 7.8* 03/29/2013 0350   CALCIUM 9.1 06/26/2011 0728   ALKPHOS 124 06/22/2013 1428   ALKPHOS 91 04/30/2013 1301   AST 89* 06/22/2013 1428   AST 67* 04/30/2013 1301   ALT 110* 06/22/2013 1428   ALT 93* 04/30/2013 1301   BILITOT 0.30 06/22/2013 1428   BILITOT 0.4 04/30/2013 1301       Lab Results  Component Value Date   LABCA2 595* 06/22/2013    STUDIES: Nm Pet Image Restag (ps) Skull Base To Thigh  06/01/2013   CLINICAL DATA:  Subsequent treatment strategy for metastatic breast carcinoma.  EXAM: NUCLEAR MEDICINE PET SKULL BASE TO THIGH  TECHNIQUE: 9.4 mCi F-18 FDG was injected intravenously. Full-ring PET imaging was performed from the skull base to thigh after the radiotracer. CT data was obtained and used for attenuation correction and anatomic localization.  FASTING BLOOD GLUCOSE:  Value: 96 mg/dl  COMPARISON:  03/23/2013  FINDINGS: NECK  No hypermetabolic lymph nodes in the neck.  CHEST  No hypermetabolic mediastinal or hilar nodes. No suspicious  pulmonary nodules on the CT scan.  ABDOMEN/PELVIS  No abnormal hypermetabolic  activity within the liver, pancreas, adrenal glands, or spleen. No hypermetabolic lymph nodes in the abdomen or pelvis.  SKELETON  There has been significant increase in hypermetabolic activity associated with diffuse osseous metastatic disease throughout the neck, chest, abdomen, and pelvis since prior exam. Index lesion in the left iliac wing currently has a maximum SUV of 9.4 on image 149 compared to 7.1 on prior exam. Another index lesion in the right ilium has a maximum SUV of 8.7 on image 149 compared to 6.9 on prior exam. The CT appearance of these mixed sclerotic and lytic bone metastases shows no significant change. This suggests hypermetabolic response to treatment, with progression of osseous metastatic disease considered less likely.  IMPRESSION: No evidence of soft tissue metastatic disease.  Increased hypermetabolic activity associated with diffuse osseous metastatic disease, although the CT appearance shows no significant change. This suggests hypermetabolic response to treatment, with progression of osseous metastatic disease considered less likely.   Electronically Signed   By: Earle Gell M.D.   On: 06/01/2013 12:19   Dg Hip Complete Right  06/29/2013   CLINICAL DATA:  New onset of pain. History of breast cancer metastatic to bone. New onset of right hip pain radiating to tailbone for 3 days. No known injury.  EXAM: RIGHT HIP - COMPLETE 2+ VIEW  COMPARISON:  Pelvis and hip MRI 03/23/2013 and PET-CT 06/01/2013  FINDINGS: There are patchy areas of sclerosis in the pelvis, involving both ischia, the left iliac bone, near the left sacroiliac joint, and the right iliac bone. These correspond to sclerotic metastases seen on recent PET-CT. Please note that the overall number of sclerotic metastases is underestimated on plain radiographs. The right hip is located. There are no significant degenerative changes of the hips.  No  acute fracture of the pelvis or proximal right femur is identified. There are degenerative changes of the lower lumbar spine. Known metastatic disease in the lower lumbar spine is best demonstrated on recent PET-CT.  IMPRESSION: Bony metastatic disease. No acute superimposed bony abnormality identified.   Electronically Signed   By: Curlene Dolphin M.D.   On: 06/29/2013 12:40    ASSESSMENT: 63 y.o. [place] woman   (1)  status post Left breast and Left axillary lymph node biopsy Dec 2010 for a cT3 pN1 (stage IIIA) invasive ductal carcinoma, grade 2, estrogen and progesterone receptor positive, HER-2 negative, with an MIB-1 of 44%;   (2)  treated neoadjuvantly with dose dense cyclophosphamide and doxorubicin x4 followed by weekly paclitaxel x7  (3) s/p bilateral mastectomies May 2011. She had a residual 1.5 cm tumor, and one of 8 sampled lymph nodes was positive [ypT1c ypN1]. Margins were negative.   (4)  She completed post-mastectomy radiation in August of 2011   (5) on letrozole between August 2011 and October 2014  (6) PET scan 11/05/2012 obtained to evaluate right chest wall pain documents bony metastatic disease to the right seventh rib, L1, and the right acetabulum with an associated pathologic fracture through the anterior cortex of the acetabulum measuring 3.2 cm. There was no evidence of extra osseous metastatic disease.  (7) starting 11/05/2012 a received fulvestrant, 500 mg monthly, in addition to monthly infusions of zoledronic acid. The fulvestrant was discontinued May 2015 with evidence of progression  (8) Right rib, left hip and right hip were all treated to 37.5 Gy in 15 fractions at 2.5 Gy per fraction completed 12/17/2012  (9) 02/19/2013 bone marrow biopsy confirms metastatic disease to bone. HER-2 was not done because  of concerns regarding because of fine the specimen. Estrogen and progesterone receptors were read as negative. This i was felt to be possibly discordant.  (10)  patient's tumor carries a PIK3 mutation as documented at [place]  (11) L ribcage pain: s/p palliative XRT completed 06/16/2013  (12) declined participation in a capecitabine plus PIK 3 inhibitor at [facility]; started capecitabine alone 06/27/2013  (13) radiation to Right sacrum/ lower lumbar spine 06/10 to 06/23 ongoing   PLAN:  Patz pain is better controlled, but certainly not where I would like it to be. It really should be at least below of 5, and preferably 3 or less. I have asked her to not simply sit still to avoid the pain, but try to be as active as she can, and take as much pain medicine as it takes to increase her activity level. At night she can take extra since she is going to go to sleep anyway. She is at risk both for constipation from the dialogue head and diarrhea from the Xeloda, but as of now she is doing very well with her bowel prophylaxis regimen. She is hoping with the radiation she just started her pain in general will decrease and she will be able to go back to her previous dialogue with dosage, which was very well tolerated.  I should add that still is having a prostate biopsy this week very may well need surgery at some point this year it features out to have prostate cancer.  Pap will return at the end of this month for her zolendronate. I will arrange for a visit.day. She should be done with her radiation by then. She will see me the first week in July she starts her third cycle of capecitabine.  The patient has a good understanding of the overall plan. She agrees with it. She knows the goal of treatment in her case is cure. She will call with any problems that may develop before her next visit here.    Chauncey Cruel, MD 07/02/2013

## 2013-07-02 NOTE — Telephone Encounter (Signed)
I have adjusted 6/29 appt.  JMW

## 2013-07-03 ENCOUNTER — Ambulatory Visit
Admission: RE | Admit: 2013-07-03 | Discharge: 2013-07-03 | Disposition: A | Discharge status: Home / Self Care | Payer: Medicare Other | Source: Ambulatory Visit | Attending: Radiation Oncology | Admitting: Radiation Oncology

## 2013-07-03 DIAGNOSIS — Z510 Encounter for antineoplastic radiation therapy: Secondary | ICD-10-CM | POA: Diagnosis not present

## 2013-07-03 LAB — CEA: CEA: 152.6 ng/mL — AB (ref 0.0–5.0)

## 2013-07-06 ENCOUNTER — Ambulatory Visit
Admission: RE | Admit: 2013-07-06 | Discharge: 2013-07-06 | Disposition: A | Discharge status: Home / Self Care | Payer: Medicare Other | Source: Ambulatory Visit | Attending: Radiation Oncology | Admitting: Radiation Oncology

## 2013-07-06 DIAGNOSIS — Z510 Encounter for antineoplastic radiation therapy: Secondary | ICD-10-CM | POA: Diagnosis not present

## 2013-07-07 ENCOUNTER — Ambulatory Visit
Admission: RE | Admit: 2013-07-07 | Discharge: 2013-07-07 | Disposition: A | Discharge status: Home / Self Care | Payer: Medicare Other | Source: Ambulatory Visit | Attending: Radiation Oncology | Admitting: Radiation Oncology

## 2013-07-07 ENCOUNTER — Encounter: Payer: Self-pay | Admitting: Radiation Oncology

## 2013-07-07 VITALS — BP 121/79 | HR 85 | Temp 98.7°F | Ht 64.0 in | Wt 178.8 lb

## 2013-07-07 DIAGNOSIS — C50512 Malignant neoplasm of lower-outer quadrant of left female breast: Secondary | ICD-10-CM

## 2013-07-07 DIAGNOSIS — Z510 Encounter for antineoplastic radiation therapy: Secondary | ICD-10-CM | POA: Diagnosis not present

## 2013-07-07 NOTE — Progress Notes (Addendum)
Brooke [name] has received 5 fractions to her right sacrum.  She grades pain as a level 2 in the right hip.  Intermittent radiation of pain down her right left.  Reports feeling off balance at times when she is walking.  She reported that she stopped taking Xeloda because of "a lot of nausea, did not feel good, and had changes in cognition".

## 2013-07-07 NOTE — Progress Notes (Signed)
Weekly Management Note Current Dose: 15  Gy  Projected Dose: 30 Gy   Narrative:  The patient presents for routine under treatment assessment.  CBCT/MVCT images/Port film x-rays were reviewed.  The chart was checked. Feeling much better. Dilaudid only tid. Stopped Xeloda and feels this helped. Has decided not to do further chemo which she will Discuss with Magrinat in July at her next appt.   Physical Findings: Weight: 178 lb 12.8 oz (81.103 kg). Looks well. More alert. No distress  Impression:  The patient is tolerating radiation.  Plan:  Continue treatment as planned. Continue prn Dilaudid.

## 2013-07-08 ENCOUNTER — Ambulatory Visit
Admission: RE | Admit: 2013-07-08 | Discharge: 2013-07-08 | Disposition: A | Discharge status: Home / Self Care | Payer: Medicare Other | Source: Ambulatory Visit | Attending: Radiation Oncology | Admitting: Radiation Oncology

## 2013-07-08 DIAGNOSIS — Z510 Encounter for antineoplastic radiation therapy: Secondary | ICD-10-CM | POA: Diagnosis not present

## 2013-07-09 ENCOUNTER — Ambulatory Visit
Admission: RE | Admit: 2013-07-09 | Discharge: 2013-07-09 | Disposition: A | Discharge status: Home / Self Care | Payer: Medicare Other | Source: Ambulatory Visit | Attending: Radiation Oncology | Admitting: Radiation Oncology

## 2013-07-09 DIAGNOSIS — Z510 Encounter for antineoplastic radiation therapy: Secondary | ICD-10-CM | POA: Diagnosis not present

## 2013-07-10 ENCOUNTER — Ambulatory Visit
Admission: RE | Admit: 2013-07-10 | Discharge: 2013-07-10 | Disposition: A | Discharge status: Home / Self Care | Payer: Medicare Other | Source: Ambulatory Visit | Attending: Radiation Oncology | Admitting: Radiation Oncology

## 2013-07-10 DIAGNOSIS — Z510 Encounter for antineoplastic radiation therapy: Secondary | ICD-10-CM | POA: Diagnosis not present

## 2013-07-10 NOTE — Progress Notes (Signed)
  Radiation Oncology         [phone] ________________________________  Name: Brooke [name] MRN: [id number]  Date: 06/08/13  DOB: 28-Aug-1950  Simulation Verification Note  Status: outpatient  NARRATIVE: The patient was brought to the treatment unit and placed in the planned treatment position. The clinical setup was verified. Then port films were obtained and uploaded to the radiation oncology medical record software.  The treatment beams were carefully compared against the planned radiation fields. The position location and shape of the radiation fields was reviewed. The targeted volume of tissue appears appropriately covered by the radiation beams. Organs at risk appear to be excluded as planned.  Based on my personal review, I approved the simulation verification. The patient's treatment will proceed as planned.  ------------------------------------------------  Thea Silversmith, MD

## 2013-07-13 ENCOUNTER — Ambulatory Visit
Admission: RE | Admit: 2013-07-13 | Discharge: 2013-07-13 | Disposition: A | Discharge status: Home / Self Care | Payer: Medicare Other | Source: Ambulatory Visit | Attending: Radiation Oncology | Admitting: Radiation Oncology

## 2013-07-13 DIAGNOSIS — Z510 Encounter for antineoplastic radiation therapy: Secondary | ICD-10-CM | POA: Diagnosis not present

## 2013-07-14 ENCOUNTER — Ambulatory Visit
Admission: RE | Admit: 2013-07-14 | Discharge: 2013-07-14 | Disposition: A | Discharge status: Home / Self Care | Payer: Medicare Other | Source: Ambulatory Visit | Attending: Radiation Oncology | Admitting: Radiation Oncology

## 2013-07-14 ENCOUNTER — Encounter: Payer: Self-pay | Admitting: Radiation Oncology

## 2013-07-14 ENCOUNTER — Other Ambulatory Visit: Payer: Self-pay | Admitting: Physician Assistant

## 2013-07-14 ENCOUNTER — Ambulatory Visit: Payer: Medicare Other | Admitting: Radiation Oncology

## 2013-07-14 VITALS — BP 132/76 | HR 83 | Temp 98.8°F | Wt 178.8 lb

## 2013-07-14 DIAGNOSIS — C50911 Malignant neoplasm of unspecified site of right female breast: Secondary | ICD-10-CM

## 2013-07-14 DIAGNOSIS — C7951 Secondary malignant neoplasm of bone: Principal | ICD-10-CM

## 2013-07-14 DIAGNOSIS — Z510 Encounter for antineoplastic radiation therapy: Secondary | ICD-10-CM | POA: Diagnosis not present

## 2013-07-14 NOTE — Progress Notes (Signed)
  Radiation Oncology         [phone] ________________________________  Name: Brooke [name] MRN: [id number]  Date: 07/14/2013  DOB: [birth year]  End of Treatment Note  Diagnosis:   Metastatic breast cancer     Indication for treatment:  Palliation       Radiation treatment dates:   06/22/13-07/14/13  Site/dose:   Right sacrum to 30 Gy in 10 fractions at 3 Gy per fraction.  Beams/energy:   AP/PA with 10 and 15 MV photons   Narrative: The patient tolerated radiation treatment relatively well.   She had a decrease in her pain almost immediately.   Plan: The patient has completed radiation treatment. The patient will return to radiation oncology clinic for routine followup in one month. I advised them to call or return sooner if they have any questions or concerns related to their recovery or treatment. She also has follow up scheduled with medical oncology.   ------------------------------------------------  Thea Silversmith, MD

## 2013-07-15 ENCOUNTER — Other Ambulatory Visit: Payer: Self-pay | Admitting: Physician Assistant

## 2013-07-15 ENCOUNTER — Ambulatory Visit: Payer: Medicare Other

## 2013-07-16 ENCOUNTER — Ambulatory Visit: Payer: Medicare Other

## 2013-07-17 ENCOUNTER — Ambulatory Visit: Payer: Medicare Other

## 2013-07-17 NOTE — Progress Notes (Signed)
  Radiation Oncology         [phone] ________________________________  Name: Brooke [name] MRN: [id number]  Date: 07/14/2013  DOB: [birth year]  End of Treatment Note  Diagnosis:   Metastatic breast cancer to sacrum     Indication for treatment:  Palliative       Radiation treatment dates:   07/01/2013-07/14/2013  Site/dose:   Right sacrum/ 30 gy at 3 gy per fraction x 10 fractions  Beams/energy:   10 and 15 MV photons were used in an AP/PA technique.   Narrative: The patient tolerated radiation treatment relatively well.   She had almost complete resolution of her pain although she continued to take Dilaudid.   Plan: The patient has completed radiation treatment. The patient will return to radiation oncology clinic for routine followup in one month. I advised them to call or return sooner if they have any questions or concerns related to their recovery or treatment.  ------------------------------------------------  Thea Silversmith, MD

## 2013-07-20 ENCOUNTER — Other Ambulatory Visit: Payer: Medicare Other

## 2013-07-20 ENCOUNTER — Ambulatory Visit (HOSPITAL_BASED_OUTPATIENT_CLINIC_OR_DEPARTMENT_OTHER): Payer: Medicare Other | Admitting: Adult Health

## 2013-07-20 ENCOUNTER — Ambulatory Visit (HOSPITAL_BASED_OUTPATIENT_CLINIC_OR_DEPARTMENT_OTHER): Payer: Medicare Other

## 2013-07-20 ENCOUNTER — Other Ambulatory Visit: Payer: Self-pay | Admitting: *Deleted

## 2013-07-20 ENCOUNTER — Other Ambulatory Visit (HOSPITAL_BASED_OUTPATIENT_CLINIC_OR_DEPARTMENT_OTHER): Payer: Medicare Other

## 2013-07-20 ENCOUNTER — Ambulatory Visit: Payer: Medicare Other

## 2013-07-20 ENCOUNTER — Encounter: Payer: Self-pay | Admitting: Adult Health

## 2013-07-20 VITALS — BP 133/82 | HR 86 | Temp 98.7°F | Resp 18 | Ht 64.0 in | Wt 177.3 lb

## 2013-07-20 DIAGNOSIS — C50519 Malignant neoplasm of lower-outer quadrant of unspecified female breast: Secondary | ICD-10-CM

## 2013-07-20 DIAGNOSIS — C50512 Malignant neoplasm of lower-outer quadrant of left female breast: Secondary | ICD-10-CM

## 2013-07-20 DIAGNOSIS — C7952 Secondary malignant neoplasm of bone marrow: Secondary | ICD-10-CM

## 2013-07-20 DIAGNOSIS — C7951 Secondary malignant neoplasm of bone: Secondary | ICD-10-CM

## 2013-07-20 DIAGNOSIS — Z170 Estrogen receptor positive status [ER+]: Secondary | ICD-10-CM

## 2013-07-20 DIAGNOSIS — C50919 Malignant neoplasm of unspecified site of unspecified female breast: Secondary | ICD-10-CM

## 2013-07-20 DIAGNOSIS — Z9010 Acquired absence of unspecified breast and nipple: Secondary | ICD-10-CM

## 2013-07-20 LAB — COMPREHENSIVE METABOLIC PANEL (CC13)
ALBUMIN: 3.5 g/dL (ref 3.5–5.0)
ALT: 67 U/L — ABNORMAL HIGH (ref 0–55)
AST: 49 U/L — ABNORMAL HIGH (ref 5–34)
Alkaline Phosphatase: 125 U/L (ref 40–150)
Anion Gap: 9 mEq/L (ref 3–11)
BUN: 12.6 mg/dL (ref 7.0–26.0)
CALCIUM: 10 mg/dL (ref 8.4–10.4)
CHLORIDE: 106 meq/L (ref 98–109)
CO2: 24 mEq/L (ref 22–29)
Creatinine: 1 mg/dL (ref 0.6–1.1)
GLUCOSE: 111 mg/dL (ref 70–140)
POTASSIUM: 4 meq/L (ref 3.5–5.1)
Sodium: 140 mEq/L (ref 136–145)
TOTAL PROTEIN: 6.7 g/dL (ref 6.4–8.3)
Total Bilirubin: 0.36 mg/dL (ref 0.20–1.20)

## 2013-07-20 LAB — CBC WITH DIFFERENTIAL/PLATELET
BASO%: 0.7 % (ref 0.0–2.0)
Basophils Absolute: 0 10*3/uL (ref 0.0–0.1)
EOS ABS: 0.4 10*3/uL (ref 0.0–0.5)
EOS%: 7.7 % — ABNORMAL HIGH (ref 0.0–7.0)
HCT: 37.9 % (ref 34.8–46.6)
HEMOGLOBIN: 12.5 g/dL (ref 11.6–15.9)
LYMPH#: 0.8 10*3/uL — AB (ref 0.9–3.3)
LYMPH%: 16.5 % (ref 14.0–49.7)
MCH: 29 pg (ref 25.1–34.0)
MCHC: 32.9 g/dL (ref 31.5–36.0)
MCV: 88 fL (ref 79.5–101.0)
MONO#: 0.6 10*3/uL (ref 0.1–0.9)
MONO%: 12.7 % (ref 0.0–14.0)
NEUT#: 2.8 10*3/uL (ref 1.5–6.5)
NEUT%: 62.4 % (ref 38.4–76.8)
Platelets: 188 10*3/uL (ref 145–400)
RBC: 4.31 10*6/uL (ref 3.70–5.45)
RDW: 15.1 % — AB (ref 11.2–14.5)
WBC: 4.6 10*3/uL (ref 3.9–10.3)

## 2013-07-20 MED ORDER — SODIUM CHLORIDE 0.9 % IV SOLN
INTRAVENOUS | Status: DC
  Administered 2013-07-20: 14:00:00 via INTRAVENOUS

## 2013-07-20 MED ORDER — ZOLEDRONIC ACID 4 MG/100ML IV SOLN
4.0000 mg | Freq: Once | INTRAVENOUS | Status: AC
  Administered 2013-07-20: 4 mg via INTRAVENOUS
  Filled 2013-07-20: qty 100

## 2013-07-20 MED ORDER — HYDROMORPHONE HCL 2 MG PO TABS: 2.0000 mg | ORAL_TABLET | Freq: Four times a day (QID) | ORAL | Status: DC | PRN

## 2013-07-20 NOTE — Patient Instructions (Signed)

## 2013-07-20 NOTE — Progress Notes (Signed)
ID: Brooke [name]   DOB: 1950-01-28  MR#: [id number]  NOB#:[id number]  PCP: Irven Shelling, MD GYN: SU:  OTHER MD: Thea Silversmith, Gaynelle Arabian, [name] [phone]), Willa Rough DDS  CHIEF COMPLAINT: stage IV breast cancer CURRENT THERAPY: capecitabine  BREAST CANCER HISTORY: From the January 2011 summary:  Malerie had screening mammography in August 04, 2007 which showed no specific mammographic evidence of malignancy, but she reported a left breast lump.  Accordingly, she was brought back on August 11, 2007 for mammography and ultrasonography.  There was indeed an obscured mass in the upper left breast which by ultrasound appeared to be a simple cyst.    Screening mammogram on August 04, 2008 showed in addition to dense breasts, again a possible mass in the left breast.  She had diagnostic mammography August 06, 2008 and additional views in addition to very dense breasts did not show any persistent mass or distortion.  Furthermore, Dr. Owens Shark was not able to palpate any abnormality in the lateral portion of the left breast.  Ultrasound showed normal appearing fibroglandular tissue in the area in question.    More recently, about September, the patient felt again something like a lump in the left breast and she had some pain associated with this.  This was initially felt simply to be mastalgia and was treated as such, but as it persisted, on December 23rd, the patient had left diagnostic mammography and ultrasonography.  There was a focally dense area in the left breast with very minimal distortion corresponding to the area of palpable abnormality. The ultrasound showed an irregular ill-defined, hypoechoic area with distal shadowing, measuring approximately 1 cm corresponding to the patient's palpable abnormality.    With this information, the patient was brought back for ultrasound-guided core biopsy and this was performed December 28th. 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  There was no evidence of angiolymphatic invasion in the material submitted which measured 1.5 cm maximally.  In addition to the mass in the breast, a lymph node in the left axilla was biopsied and was likewise positive.  The tumor was ER positive at 89%, PR positive at 34% with an elevated MIB-1 at 44% and Her-2 negative with a ratio of 1.13.    Her subsequent history is as detailed below.  INTERVAL HISTORY: [name] returns for followup of her metastatic breast cancer accompanied by her husband [name]. She has stopped taking Capecitabine due to side effects.  She is planning on discussing this with Dr. Jana Hakim on 07/27/13.  The patient completed radiation with Dr. Pablo Ledger on 6/23 and did notice an improvement in her associated pain due to this.  She is requesting a Dilaudid refill today, and she does have lowerback pain and uses Dilaudid if needed for this.  She denies any bowel/bladder incontinence, weakness or numbness.  She denies fevers, chills, nausea, vomiting, constipation, diarrhea, or any further concerns.    REVIEW OF SYSTEMS: A 10 point review of systems was conducted and is otherwise negative except for what is noted above.     PAST MEDICAL HISTORY: Past Medical History  Diagnosis Date  . Hx Breast Cancer, IDC, Stahe III, receptor + 10/20/2010    left breast  . Breast cancer December 2010    invasive ductal and invasive lobular; bilateral mastectomy and radiation  . Mitral valve prolapse   . History of recurrent UTIs   . Radiation 07/26/09-09/08/09    left breast  .  Fracture, pelvis closed 11/2012    both hipsand number 7 rib, 15 rounds radiation  1. Remote migraines, currently inactive.   2. History of prior diagnosis of fibromyalgia made at [facility] and currently not active (she was treated with trimethoprim and nortriptyline for one year for this).    3. History of recurrent UTIs.   4. History of benign  left breast cyst aspirated in 2004 at [facility] under Dr. Melene Plan.   5. History of tonsillectomy and adenoidectomy.   6. History of right LASEK surgery. 7. History of bilateral reduction mammoplasties in 1994.   8. History of mitral valve prolapse diagnosed in 1984.  PAST SURGICAL HISTORY: Past Surgical History  Procedure Laterality Date  . Mastectomy Bilateral 2011    left breast cancer  . Lasik    . Reduction mammaplasty Bilateral 1994  . Tonsillectomy and adenoidectomy    . Aspiration of left breast Left 2004    [facility]/Dr.Teo  . Bone biopsy  02/19/13    mets from breast cancer-receptors results not back    FAMILY HISTORY Family History  Problem Relation Age of Onset  . Lung cancer Father 8    smoker for 25 years  . Heart disease Maternal Grandmother   . Heart disease Maternal Grandfather   . Diabetes type I Mother   . Diabetes type I Sister   . Diabetes type I Sister   . Heart disease Maternal Uncle   . Uterine cancer Paternal Grandmother 40  . Breast cancer Other 8    paternal great grandmother  [name] died at the age of 81 from lung cancer.  He had been a remote smoker and had been in [employer] with a question of possible asbestos exposure.  [name] died at the age of 48 in the setting of Alzheimer's, coronary artery disease, diabetes and hypertension.  The patient had two sisters.  One died from complications of diabetes.  The other one is alive with diabetes.    GYNECOLOGIC HISTORY: She is GX P1.  First pregnancy to term at age 89.  The patient's last period was in 2008.  She took hormone replacement for about 18 months until mid-2008.  She had no complications from that treatment.    SOCIAL HISTORY: (Updated October 2014) She used to be a Arts development officer for [employer]  They used to live in the [place] area. 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  Her husband,  [name], retired October 2012.  He was a Arts development officer for 28 years.  Their son, [name], is currently with the [employer] at the [employer] in [place]. [name].  She attends [employer].   ADVANCED DIRECTIVES: in place  HEALTH MAINTENANCE: (Updated 11/19/2012) History  Substance Use Topics  . Smoking status: Former Smoker -- 1.00 packs/day for 11 years    Quit date: 01/23/1984  . Smokeless tobacco: Never Used  . Alcohol Use: 1.5 - 2.5 oz/week    3-5 drink(s) per week     Comment: glasses of wine     Colonoscopy: 2010  PAP: Jan 2014  Bone density: July 2014, osteopenia  Lipid panel: per Dr Valentina Lucks  Allergies  Allergen Reactions  . Sulfa Antibiotics Shortness Of Breath  . Codeine Nausea And Vomiting    Tolerates oxycodone if given with prochlorperazine for nausea  . Morphine And Related Nausea And Vomiting  Per patient possibility that the IR morphine was causing the vomiting   Scheduled Meds: Continuous Infusions: PRN Meds:.   OBJECTIVE: Middle-aged white woman who appears stated age  28 Vitals:   07/20/13 1120  BP: 133/82  Pulse: 86  Temp: 98.7 F (37.1 C)  Resp: 18   Sclerae unicteric, pupils equal and reactive Oropharynx clear and moist-- no thrush or other lesions No cervical or supraclavicular adenopathy Lungs no rales or rhonchi Heart regular rate and rhythm Abd soft, nontender, positive bowel sounds MSK no focal spinal tenderness to moderate percussion, no upper extremity lymphedema Neuro: nonfocal, well oriented, appropriate affect Breasts: Deferred   LAB RESULTS:  Lab Results  Component Value Date   WBC 4.6 07/20/2013   NEUTROABS 2.8 07/20/2013   HGB 12.5 07/20/2013   HCT 37.9 07/20/2013   MCV 88.0 07/20/2013   PLT 188 07/20/2013      Chemistry      Component Value Date/Time   NA 140 07/20/2013 1117   NA 141 03/29/2013 0350   NA 142 06/26/2011 0728   K 4.0 07/20/2013 1117   K 3.7 03/29/2013 0350    K 4.6 06/26/2011 0728   CL 111 03/29/2013 0350   CL 105 01/07/2012 1347   CL 102 06/26/2011 0728   CO2 24 07/20/2013 1117   CO2 18* 03/29/2013 0350   CO2 30 06/26/2011 0728   BUN 12.6 07/20/2013 1117   BUN 10 03/29/2013 0350   BUN 12 06/26/2011 0728   CREATININE 1.0 07/20/2013 1117   CREATININE 0.72 03/29/2013 0350   CREATININE 1.1 06/26/2011 0728      Component Value Date/Time   CALCIUM 10.0 07/20/2013 1117   CALCIUM 7.8* 03/29/2013 0350   CALCIUM 9.1 06/26/2011 0728   ALKPHOS 125 07/20/2013 1117   ALKPHOS 91 04/30/2013 1301   AST 49* 07/20/2013 1117   AST 67* 04/30/2013 1301   ALT 67* 07/20/2013 1117   ALT 93* 04/30/2013 1301   BILITOT 0.36 07/20/2013 1117   BILITOT 0.4 04/30/2013 1301       Lab Results  Component Value Date   LABCA2 595* 06/22/2013    STUDIES: Nm Pet Image Restag (ps) Skull Base To Thigh  06/01/2013   CLINICAL DATA:  Subsequent treatment strategy for metastatic breast carcinoma.  EXAM: NUCLEAR MEDICINE PET SKULL BASE TO THIGH  TECHNIQUE: 9.4 mCi F-18 FDG was injected intravenously. Full-ring PET imaging was performed from the skull base to thigh after the radiotracer. CT data was obtained and used for attenuation correction and anatomic localization.  FASTING BLOOD GLUCOSE:  Value: 96 mg/dl  COMPARISON:  03/23/2013  FINDINGS: NECK  No hypermetabolic lymph nodes in the neck.  CHEST  No hypermetabolic mediastinal or hilar nodes. No suspicious pulmonary nodules on the CT scan.  ABDOMEN/PELVIS  No abnormal hypermetabolic activity within the liver, pancreas, adrenal glands, or spleen. No hypermetabolic lymph nodes in the abdomen or pelvis.  SKELETON  There has been significant increase in hypermetabolic activity associated with diffuse osseous metastatic disease throughout the neck, chest, abdomen, and pelvis since prior exam. Index lesion in the left iliac wing currently has a maximum SUV of 9.4 on image 149 compared to 7.1 on prior exam. Another index lesion in the right ilium has a maximum SUV of  8.7 on image 149 compared to 6.9 on prior exam. The CT appearance of these mixed sclerotic and lytic bone metastases shows no significant change. This suggests hypermetabolic response to treatment, with progression of osseous metastatic disease considered  less likely.  IMPRESSION: No evidence of soft tissue metastatic disease.  Increased hypermetabolic activity associated with diffuse osseous metastatic disease, although the CT appearance shows no significant change. This suggests hypermetabolic response to treatment, with progression of osseous metastatic disease considered less likely.   Electronically Signed   By: Earle Gell M.D.   On: 06/01/2013 12:19   Dg Hip Complete Right  06/29/2013   CLINICAL DATA:  New onset of pain. History of breast cancer metastatic to bone. New onset of right hip pain radiating to tailbone for 3 days. No known injury.  EXAM: RIGHT HIP - COMPLETE 2+ VIEW  COMPARISON:  Pelvis and hip MRI 03/23/2013 and PET-CT 06/01/2013  FINDINGS: There are patchy areas of sclerosis in the pelvis, involving both ischia, the left iliac bone, near the left sacroiliac joint, and the right iliac bone. These correspond to sclerotic metastases seen on recent PET-CT. Please note that the overall number of sclerotic metastases is underestimated on plain radiographs. The right hip is located. There are no significant degenerative changes of the hips.  No acute fracture of the pelvis or proximal right femur is identified. There are degenerative changes of the lower lumbar spine. Known metastatic disease in the lower lumbar spine is best demonstrated on recent PET-CT.  IMPRESSION: Bony metastatic disease. No acute superimposed bony abnormality identified.   Electronically Signed   By: Curlene Dolphin M.D.   On: 06/29/2013 12:40    ASSESSMENT: 63 y.o. [place] woman   (1)  status post Left breast and Left axillary lymph node biopsy Dec 2010 for a cT3 pN1 (stage IIIA) invasive ductal carcinoma, grade 2,  estrogen and progesterone receptor positive, HER-2 negative, with an MIB-1 of 44%;   (2)  treated neoadjuvantly with dose dense cyclophosphamide and doxorubicin x4 followed by weekly paclitaxel x7  (3) s/p bilateral mastectomies May 2011. She had a residual 1.5 cm tumor, and one of 8 sampled lymph nodes was positive [ypT1c ypN1]. Margins were negative.   (4)  She completed post-mastectomy radiation in August of 2011   (5) on letrozole between August 2011 and October 2014  (6) PET scan 11/05/2012 obtained to evaluate right chest wall pain documents bony metastatic disease to the right seventh rib, L1, and the right acetabulum with an associated pathologic fracture through the anterior cortex of the acetabulum measuring 3.2 cm. There was no evidence of extra osseous metastatic disease.  (7) starting 11/05/2012 a received fulvestrant, 500 mg monthly, in addition to monthly infusions of zoledronic acid. The fulvestrant was discontinued May 2015 with evidence of progression  (8) Right rib, left hip and right hip were all treated to 37.5 Gy in 15 fractions at 2.5 Gy per fraction completed 12/17/2012  (9) 02/19/2013 bone marrow biopsy confirms metastatic disease to bone. HER-2 was not done because of concerns regarding because of fine the specimen. Estrogen and progesterone receptors were read as negative. This i was felt to be possibly discordant.  (10) patient's tumor carries a PIK3 mutation as documented at [facility]  (11) L ribcage pain: s/p palliative XRT completed 06/16/2013  (12) declined participation in a capecitabine plus PIK 3 inhibitor at [facility]; started capecitabine alone 06/27/2013  (13) radiation to Right sacrum/ lower lumbar spine 06/10 to 06/23    PLAN:   Mrs. Delude is doing moderately well today.  Her labs are stable.  She and her husband requested that she have a CEA and CA 27-29 at her next appointment as they are seeing  a MD at [facility] as  well and they requested these.  I ordered those today.  She has stopped the Capecitabine due to side effects and will discuss this and future treatment plans with Dr. Jana Hakim.  Her pain is stable and she is taking Dilaudid 5 times per day.  I refilled this for her today.  Her pain remains stable, and she is not having any signs of numbness/weakness, or an increase.    The patient will return next week for labs and f/u with Dr. Jana Hakim.    The patient has a good understanding of the overall plan. She agrees with it. She knows the goal of treatment in her case is control. She will call with any problems that may develop before her next visit here.  I spent 25 minutes counseling the patient face to face.  The total time spent in the appointment was 30 minutes.  Minette Headland, [place] [phone] 07/20/2013

## 2013-07-21 ENCOUNTER — Ambulatory Visit: Payer: Medicare Other

## 2013-07-23 ENCOUNTER — Ambulatory Visit: Payer: Medicare Other | Admitting: Radiation Oncology

## 2013-07-27 ENCOUNTER — Other Ambulatory Visit: Payer: Medicare Other

## 2013-07-27 ENCOUNTER — Telehealth: Payer: Self-pay | Admitting: Oncology

## 2013-07-27 ENCOUNTER — Ambulatory Visit (HOSPITAL_BASED_OUTPATIENT_CLINIC_OR_DEPARTMENT_OTHER): Payer: Medicare Other | Admitting: Oncology

## 2013-07-27 VITALS — BP 128/83 | HR 93 | Temp 98.2°F | Resp 18 | Ht 64.0 in | Wt 176.7 lb

## 2013-07-27 DIAGNOSIS — M79603 Pain in arm, unspecified: Secondary | ICD-10-CM

## 2013-07-27 DIAGNOSIS — C7951 Secondary malignant neoplasm of bone: Secondary | ICD-10-CM

## 2013-07-27 DIAGNOSIS — C50512 Malignant neoplasm of lower-outer quadrant of left female breast: Secondary | ICD-10-CM

## 2013-07-27 DIAGNOSIS — C7952 Secondary malignant neoplasm of bone marrow: Secondary | ICD-10-CM

## 2013-07-27 DIAGNOSIS — C50919 Malignant neoplasm of unspecified site of unspecified female breast: Secondary | ICD-10-CM

## 2013-07-27 DIAGNOSIS — C50519 Malignant neoplasm of lower-outer quadrant of unspecified female breast: Secondary | ICD-10-CM

## 2013-07-27 DIAGNOSIS — M79609 Pain in unspecified limb: Secondary | ICD-10-CM

## 2013-07-27 MED ORDER — VENLAFAXINE HCL ER 75 MG PO CP24: 150.0000 mg | ORAL_CAPSULE | Freq: Every day | ORAL | Status: DC

## 2013-07-27 MED ORDER — HYDROMORPHONE HCL 2 MG PO TABS: 2.0000 mg | ORAL_TABLET | ORAL | Status: DC | PRN

## 2013-07-27 NOTE — Addendum Note (Signed)
Addended by: Laureen Abrahams on: 07/27/2013 06:05 PM   Modules accepted: Orders

## 2013-07-27 NOTE — Progress Notes (Signed)
ID: Brooke [name]   DOB: 06-06-1950  MR#: [id number]  HER#:[id number]  PCP: Irven Shelling, MD GYN: SU:  OTHER MD: Thea Silversmith, Gaynelle Arabian, [name] [phone]), Willa Rough DDS  CHIEF COMPLAINT: stage IV breast cancer CURRENT THERAPY: zolendronate  BREAST CANCER HISTORY: From the January 2011 summary:  [name] had screening mammography in August 04, 2007 which showed no specific mammographic evidence of malignancy, but she reported a left breast lump.  Accordingly, she was brought back on August 11, 2007 for mammography and ultrasonography.  There was indeed an obscured mass in the upper left breast which by ultrasound appeared to be a simple cyst.    Screening mammogram on August 04, 2008 showed in addition to dense breasts, again a possible mass in the left breast.  She had diagnostic mammography August 06, 2008 and additional views in addition to very dense breasts did not show any persistent mass or distortion.  Furthermore, Dr. Owens Shark was not able to palpate any abnormality in the lateral portion of the left breast.  Ultrasound showed normal appearing fibroglandular tissue in the area in question.    More recently, about September, the patient felt again something like a lump in the left breast and she had some pain associated with this.  This was initially felt simply to be mastalgia and was treated as such, but as it persisted, on December 23rd, the patient had left diagnostic mammography and ultrasonography.  There was a focally dense area in the left breast with very minimal distortion corresponding to the area of palpable abnormality. The ultrasound showed an irregular ill-defined, hypoechoic area with distal shadowing, measuring approximately 1 cm corresponding to the patient's palpable abnormality.    With this information, the patient was brought back for ultrasound-guided core biopsy and this was performed December 28th. 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  There was no evidence of angiolymphatic invasion in the material submitted which measured 1.5 cm maximally.  In addition to the mass in the breast, a lymph node in the left axilla was biopsied and was likewise positive.  The tumor was ER positive at 89%, PR positive at 34% with an elevated MIB-1 at 44% and Her-2 negative with a ratio of 1.13.    Her subsequent history is as detailed below.  INTERVAL HISTORY: [name] returns for followup of her metastatic breast cancer accompanied by her husband [name]. She tells me she started her capecitabine and within a few days was having crying sits, inability to concentrate, and "chemotherapy recall". He was like all the problems from her prior chemotherapy had come back. She was able to complete the week except for one final pill at the end, and within 2 or 3 days although symptoms resolved. The question at this point is "what next".  REVIEW OF SYSTEMS: Her pain at this point is a 2 or 3. She is taking 2 mg of dilated 4-5 times a day, rarely 6 times a day. She does get stomach pains in has a feeling of "knots on both sides of her waist" which is worse when she lies on her side or when she is having a bowel movement. She's developed some night sweats. She has nausea sometimes. Her appetite is otherwise okay. She denies headaches, visual changes, dizziness, cough, phlegm production, or pleurisy. There have been no changes in bladder habits. She never developed any skin changes from the capecitabine. A detailed review of systems today is otherwise  stable     PAST MEDICAL HISTORY: Past Medical History  Diagnosis Date  . Hx Breast Cancer, IDC, Stahe III, receptor + 10/20/2010    left breast  . Breast cancer December 2010    invasive ductal and invasive lobular; bilateral mastectomy and radiation  . Mitral valve prolapse   . History of recurrent UTIs   . Radiation 07/26/09-09/08/09    left breast  . Fracture,  pelvis closed 11/2012    both hipsand number 7 rib, 15 rounds radiation  1. Remote migraines, currently inactive.   2. History of prior diagnosis of fibromyalgia made at [facility] and currently not active (she was treated with trimethoprim and nortriptyline for one year for this).    3. History of recurrent UTIs.   4. History of benign left breast cyst aspirated in 2004 at [facility] under Dr. Melene Plan.   5. History of tonsillectomy and adenoidectomy.   6. History of right LASEK surgery. 7. History of bilateral reduction mammoplasties in 1994.   8. History of mitral valve prolapse diagnosed in 1984.  PAST SURGICAL HISTORY: Past Surgical History  Procedure Laterality Date  . Mastectomy Bilateral 2011    left breast cancer  . Lasik    . Reduction mammaplasty Bilateral 1994  . Tonsillectomy and adenoidectomy    . Aspiration of left breast Left 2004    [facility]/Dr.Teo  . Bone biopsy  02/19/13    mets from breast cancer-receptors results not back    FAMILY HISTORY Family History  Problem Relation Age of Onset  . Lung cancer Father 30    smoker for 25 years  . Heart disease Maternal Grandmother   . Heart disease Maternal Grandfather   . Diabetes type I Mother   . Diabetes type I Sister   . Diabetes type I Sister   . Heart disease Maternal Uncle   . Uterine cancer Paternal Grandmother 7  . Breast cancer Other 12    paternal great grandmother  [name] died at the age of 63 from lung cancer.  He had been a remote smoker and had been in [employer] with a question of possible asbestos exposure.  [name] died at the age of 42 in the setting of Alzheimer's, coronary artery disease, diabetes and hypertension.  The patient had two sisters.  One died from complications of diabetes.  The other one is alive with diabetes.    GYNECOLOGIC HISTORY: She is GX P1.  First pregnancy to term at age 5.  The patient's last  period was in 2008.  She took hormone replacement for about 18 months until mid-2008.  She had no complications from that treatment.    SOCIAL HISTORY: (Updated October 2014) She used to be a Arts development officer for [employer]  They used to live in the [place] area. 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  Her husband, [name], retired October 2012.  He was a Arts development officer for 28 years.  Their son, [name], is currently with the [employer] at the [employer] in [place]. [name].  She attends [employer].   ADVANCED DIRECTIVES: in place  HEALTH MAINTENANCE: (Updated 11/19/2012) History  Substance Use Topics  . Smoking status: Former Smoker -- 1.00 packs/day for 11 years    Quit date: 01/23/1984  . Smokeless tobacco: Never Used  . Alcohol Use: 1.5 - 2.5 oz/week  3-5 drink(s) per week     Comment: glasses of wine     Colonoscopy: 2010  PAP: Jan 2014  Bone density: July 2014, osteopenia  Lipid panel: per Dr Valentina Lucks  Allergies  Allergen Reactions  . Sulfa Antibiotics Shortness Of Breath  . Codeine Nausea And Vomiting    Tolerates oxycodone if given with prochlorperazine for nausea  . Morphine And Related Nausea And Vomiting    Per patient possibility that the IR morphine was causing the vomiting   Current outpatient prescriptions:ALPRAZolam (XANAX XR) 1 MG 24 hr tablet, Take 1 mg by mouth. Up to twice daily as needed for anxiety, Disp: , Rfl: ;  Calcium Citrate (CITRACAL PO), Take 3 tablets by mouth daily., Disp: , Rfl: ;  capecitabine (XELODA) 500 MG tablet, Take 3 tabs twice daily with food 7 days on & 7 days off starting 2nd week of June, Disp: 84 tablet, Rfl: 0 cholecalciferol (VITAMIN D) 400 UNITS TABS tablet, Take 400 Units by mouth daily. Patient reportd the Vitamin E should be vitamin D with same dose, Disp: , Rfl: ;  clobetasol cream (TEMOVATE) 0.05 %, , Disp: , Rfl: ;  diphenhydrAMINE  (BENADRYL) 25 mg capsule, Take 25 mg by mouth at bedtime as needed for sleep. , Disp: , Rfl: ;  Docusate Calcium (STOOL SOFTENER PO), Take by mouth daily., Disp: , Rfl:  gabapentin (NEURONTIN) 300 MG capsule, Take 300 mg by mouth at bedtime. , Disp: , Rfl: ;  HYDROmorphone (DILAUDID) 2 MG tablet, Take 1 tablet (2 mg total) by mouth every 4 (four) hours as needed for moderate pain or severe pain (Pt is taking 19m (two, 223mtabs) at each dose)., Disp: 180 tablet, Rfl: 0;  Polyethyl Glycol-Propyl Glycol (SYSTANE) 0.4-0.3 % SOLN, Apply 1 drop to eye 2 (two) times daily as needed (dry eyes)., Disp: , Rfl:  Polyethylene Glycol 3350 (MIRALAX PO), Take by mouth daily., Disp: , Rfl: ;  prochlorperazine (COMPAZINE) 5 MG tablet, Take 5 mg by mouth 3 (three) times daily., Disp: , Rfl: ;  Vaginal Lubricant (REPLENS VA), Place vaginally. Combining with estroglide, Disp: , Rfl: ;  venlafaxine XR (EFFEXOR-XR) 75 MG 24 hr capsule, Take 2 capsules (150 mg total) by mouth daily with breakfast., Disp: 180 capsule, Rfl: 1 Zoledronic Acid (ZOMETA IV), Inject into the vein every 30 (thirty) days., Disp: , Rfl:   OBJECTIVE: Middle-aged white woman in some distress  Filed Vitals:   07/27/13 1225  BP: 128/83  Pulse: 93  Temp: 98.2 F (36.8 C)  Resp: 18   Sclerae unicteric, pupils equal and reactive, EOMs intact Oropharynx clear and moist, teeth in good repair No cervical or supraclavicular adenopathy Lungs no rales or rhonchi Heart regular rate and rhythm Abd soft, nontender, positive bowel sounds, no masses palpated MSK no focal spinal tenderness, no upper extremity lymphedema; palpation of the area where she has the "knots on both sides" findings the lower rib cage to be painful bilaterally Neuro: nonfocal, well oriented, anxious affect Breasts: Deferred   LAB RESULTS:  Lab Results  Component Value Date   WBC 4.6 07/20/2013   NEUTROABS 2.8 07/20/2013   HGB 12.5 07/20/2013   HCT 37.9 07/20/2013   MCV 88.0  07/20/2013   PLT 188 07/20/2013      Chemistry      Component Value Date/Time   NA 140 07/20/2013 1117   NA 141 03/29/2013 0350   NA 142 06/26/2011 0728   K 4.0 07/20/2013 1117   K 3.7  03/29/2013 0350   K 4.6 06/26/2011 0728   CL 111 03/29/2013 0350   CL 105 01/07/2012 1347   CL 102 06/26/2011 0728   CO2 24 07/20/2013 1117   CO2 18* 03/29/2013 0350   CO2 30 06/26/2011 0728   BUN 12.6 07/20/2013 1117   BUN 10 03/29/2013 0350   BUN 12 06/26/2011 0728   CREATININE 1.0 07/20/2013 1117   CREATININE 0.72 03/29/2013 0350   CREATININE 1.1 06/26/2011 0728      Component Value Date/Time   CALCIUM 10.0 07/20/2013 1117   CALCIUM 7.8* 03/29/2013 0350   CALCIUM 9.1 06/26/2011 0728   ALKPHOS 125 07/20/2013 1117   ALKPHOS 91 04/30/2013 1301   AST 49* 07/20/2013 1117   AST 67* 04/30/2013 1301   ALT 67* 07/20/2013 1117   ALT 93* 04/30/2013 1301   BILITOT 0.36 07/20/2013 1117   BILITOT 0.4 04/30/2013 1301       Lab Results  Component Value Date   LABCA2 595* 06/22/2013    STUDIES: Dg Hip Complete Right  06/29/2013   CLINICAL DATA:  New onset of pain. History of breast cancer metastatic to bone. New onset of right hip pain radiating to tailbone for 3 days. No known injury.  EXAM: RIGHT HIP - COMPLETE 2+ VIEW  COMPARISON:  Pelvis and hip MRI 03/23/2013 and PET-CT 06/01/2013  FINDINGS: There are patchy areas of sclerosis in the pelvis, involving both ischia, the left iliac bone, near the left sacroiliac joint, and the right iliac bone. These correspond to sclerotic metastases seen on recent PET-CT. Please note that the overall number of sclerotic metastases is underestimated on plain radiographs. The right hip is located. There are no significant degenerative changes of the hips.  No acute fracture of the pelvis or proximal right femur is identified. There are degenerative changes of the lower lumbar spine. Known metastatic disease in the lower lumbar spine is best demonstrated on recent PET-CT.  IMPRESSION: Bony metastatic disease. No  acute superimposed bony abnormality identified.   Electronically Signed   By: Curlene Dolphin M.D.   On: 06/29/2013 12:40    ASSESSMENT: 63 y.o. [place] woman   (1)  status post Left breast and Left axillary lymph node biopsy Dec 2010 for a cT3 pN1 (stage IIIA) invasive ductal carcinoma, grade 2, estrogen and progesterone receptor positive, HER-2 negative, with an MIB-1 of 44%;   (2)  treated neoadjuvantly with dose dense cyclophosphamide and doxorubicin x4 followed by weekly paclitaxel x7  (3) s/p bilateral mastectomies May 2011. She had a residual 1.5 cm tumor, and one of 8 sampled lymph nodes was positive [ypT1c ypN1]. Margins were negative.   (4)  She completed post-mastectomy radiation in August of 2011   (5) on letrozole between August 2011 and October 2014  (6) PET scan 11/05/2012 obtained to evaluate right chest wall pain documents bony metastatic disease to the right seventh rib, L1, and the right acetabulum with an associated pathologic fracture through the anterior cortex of the acetabulum measuring 3.2 cm. There was no evidence of extra osseous metastatic disease.  (7) starting 11/05/2012 a received fulvestrant, 500 mg monthly, in addition to monthly infusions of zoledronic acid. The fulvestrant was discontinued May 2015 with evidence of progression  (8) Right rib, left hip and right hip were all treated to 37.5 Gy in 15 fractions at 2.5 Gy per fraction completed 12/17/2012  (9) 02/19/2013 bone marrow biopsy confirms metastatic disease to bone. HER-2 was not done because of concerns regarding because of  fine the specimen. Estrogen and progesterone receptors were read as negative. This i was felt to be possibly discordant.  (10) patient's tumor carries a PIK3 mutation as documented at [facility]  (11) L ribcage pain: s/p palliative XRT completed 06/16/2013  (12) declined participation in a capecitabine plus PIK 3 inhibitor at [facility]; started  capecitabine alone 06/27/2013, discontinued after one week because of side effects  (13) radiation to Right sacrum/ lower lumbar spine 06/10 to 06/23    PLAN:  I am not entirely convinced the side effects experienced from the capecitabine were directly due to the medication, but they certainly resolved after she stopped it. I don't think we are going to be able to go back to capecitabine. At the same time all the other choices are going to be at least as demanding on her, since her tumor was estrogen receptor negative when last biopsied. Today we did discuss eribulin in detail.  It would be helpful to repeat a PET scan at this point and see if she has developed soft tissue disease elsewhere that could be biopsied for a repeat prognostic panel. I have put in the order and hopefully we will be able to get that done before she returns to see me in 3 weeks.  We obtained a CEA and CA 27-29 today, and once I have the PET scan and those results I will forward them to Dr. Benjie Karvonen at [facility] so we can put our hats together and decide what would be the next best to move for [facility]. Particularly since we know her tumor has a PiK3 mutation she might qualify for her some "not chemotherapy" study.  I have liberalized her hydro morphone so she may take a tablet every 4 hours as needed. She has an excellent bowel prophylaxis regimen, which she follows correctly. I think she would benefit from an increase in her Effexor and we went up to 150 mg today. We are continuing on the zolendronate, which he receives every 4 weeks.   She will see me on the same day as the next the os. Hopefully by that time we will be able to start treatment to bring her tumor under control. She knows to call for any problems that may develop before then.  Chauncey Cruel, MD  07/27/2013

## 2013-07-27 NOTE — Telephone Encounter (Signed)
gv pt appt for 7/27. central will call re pet appt - pt aware.

## 2013-07-28 ENCOUNTER — Telehealth: Payer: Self-pay | Admitting: *Deleted

## 2013-07-28 LAB — CANCER ANTIGEN 27.29: CA 27.29: 474 U/mL — AB (ref 0–39)

## 2013-07-28 LAB — CEA: CEA: 177.2 ng/mL — ABNORMAL HIGH (ref 0.0–5.0)

## 2013-07-28 NOTE — Telephone Encounter (Signed)
This RN spoke with patient informing her that the prescription for Dilaudid was ready for pick up. Patient and husband verbalized understanding.

## 2013-07-28 NOTE — Progress Notes (Signed)
Per Dr. Jana Hakim dtd 7/6, he is waiting for PET and other labs prior to consulting with Dr. Lisbeth Renshaw.  Patient not called.       Brooke [name], Brooke [name] (MRN: [id number]) Female, 63 y.o., 06-23-1950     Weight: 176 lb 11.2 oz (80.2 kg) Height: 5\' 4"  (1.626 m)      Home: [phone] Mobile: [phone]          FYI: General      PCP: Irven Shelling, MD Primary Ins: [id number] MyChart: Active         Message Received: Today      Minette Headland, NP    Prentiss Bells, RN        Please call patient with results.  Thanks, LC           Previous Messages     ----- Message -----      From: [facility] Interface      Sent: 07/28/2013   2:26 AM        To: Minette Headland, NP     CEA  Status: Final result     Visible to patient: This result is not viewable by the patient.     Next appt: 08/12/2013 at 08:00 AM in Radiology [facility] MOBILE)     Dx: Breast cancer of lower-outer quadrant...       Ref Range 1d ago  3wk ago  51mo ago  28mo ago  62mo ago  15mo ago      CEA 0.0 - 5.0 ng/mL 177.2 (H)    152.6 (H) CM   124.1 (H) CM   96.0 (H)    81.4 (H)    51.9 (H)       Comments: Result repeated and verified.Result confirmed by automatic dilution.    Resulting Agency   [facility]          Result Narrative            Performed at:  [address], [address]               [place], [place] [address]           Specimen Collected: 07/27/13 12:08 PM Last Resulted: 07/28/13  2:26 AM          PLAN:  I am not entirely convinced the side effects experienced from the capecitabine were directly due to the medication, but they certainly resolved after she stopped it. I don't think we are going to be able to go back to capecitabine. At the same time all the other choices are going to be at least as demanding on her, since her tumor was estrogen receptor negative when last biopsied. Today we did discuss  eribulin in detail.  It would be helpful to repeat a PET scan at this point and see if she has developed soft tissue disease elsewhere that could be biopsied for a repeat prognostic panel. I have put in the order and hopefully we will be able to get that done before she returns to see me in 3 weeks.  We obtained a CEA and CA 27-29 today, and once I have the PET scan and those results I will forward them to Dr. Benjie Karvonen at [facility] so we can put our hats together and decide what would be the next  best to move for [facility]. Particularly since we know her tumor has a PiK3 mutation she might qualify for her some "not chemotherapy" study.  I have liberalized her hydro morphone so she may take a tablet every 4 hours as needed. She has an excellent bowel prophylaxis regimen, which she follows correctly. I think she would benefit from an increase in her Effexor and we went up to 150 mg today. We are continuing on the zolendronate, which he receives every 4 weeks.  She will see me on the same day as the next the os. Hopefully by that time we will be able to start treatment to bring her tumor under control. She knows to call for any problems that may develop before then.  Chauncey Cruel, MD  07/27/2013

## 2013-07-31 NOTE — Progress Notes (Signed)
On 06/30/13 a total of 3 complex treatment devices were formed including a vacloc for immobilization and 2 MLCs to protect critical normal structures as well as overlap from the previously treated fields.

## 2013-08-12 ENCOUNTER — Ambulatory Visit (HOSPITAL_COMMUNITY)
Admission: RE | Admit: 2013-08-12 | Discharge: 2013-08-12 | Disposition: A | Discharge status: Home / Self Care | Payer: Medicare Other | Source: Ambulatory Visit | Attending: Oncology | Admitting: Oncology

## 2013-08-12 DIAGNOSIS — M899 Disorder of bone, unspecified: Secondary | ICD-10-CM | POA: Insufficient documentation

## 2013-08-12 DIAGNOSIS — M949 Disorder of cartilage, unspecified: Secondary | ICD-10-CM | POA: Diagnosis not present

## 2013-08-12 DIAGNOSIS — C7952 Secondary malignant neoplasm of bone marrow: Secondary | ICD-10-CM | POA: Diagnosis not present

## 2013-08-12 DIAGNOSIS — C50919 Malignant neoplasm of unspecified site of unspecified female breast: Secondary | ICD-10-CM | POA: Insufficient documentation

## 2013-08-12 DIAGNOSIS — C50512 Malignant neoplasm of lower-outer quadrant of left female breast: Secondary | ICD-10-CM

## 2013-08-12 DIAGNOSIS — C7951 Secondary malignant neoplasm of bone: Secondary | ICD-10-CM | POA: Insufficient documentation

## 2013-08-12 DIAGNOSIS — M79603 Pain in arm, unspecified: Secondary | ICD-10-CM

## 2013-08-12 LAB — GLUCOSE, CAPILLARY: GLUCOSE-CAPILLARY: 99 mg/dL (ref 70–99)

## 2013-08-12 MED ORDER — FLUDEOXYGLUCOSE F - 18 (FDG) INJECTION
10.3000 | Freq: Once | INTRAVENOUS | Status: AC | PRN
  Administered 2013-08-12: 10.3 via INTRAVENOUS

## 2013-08-13 ENCOUNTER — Other Ambulatory Visit: Payer: Self-pay | Admitting: *Deleted

## 2013-08-13 ENCOUNTER — Ambulatory Visit: Admission: RE | Admit: 2013-08-13 | Payer: Medicare Other | Source: Ambulatory Visit | Admitting: Radiation Oncology

## 2013-08-17 ENCOUNTER — Telehealth: Payer: Self-pay | Admitting: Oncology

## 2013-08-17 ENCOUNTER — Ambulatory Visit (HOSPITAL_BASED_OUTPATIENT_CLINIC_OR_DEPARTMENT_OTHER): Payer: Medicare Other | Admitting: Oncology

## 2013-08-17 ENCOUNTER — Ambulatory Visit (HOSPITAL_BASED_OUTPATIENT_CLINIC_OR_DEPARTMENT_OTHER): Payer: Medicare Other

## 2013-08-17 ENCOUNTER — Other Ambulatory Visit: Payer: Medicare Other

## 2013-08-17 ENCOUNTER — Ambulatory Visit: Payer: Medicare Other

## 2013-08-17 ENCOUNTER — Other Ambulatory Visit (HOSPITAL_BASED_OUTPATIENT_CLINIC_OR_DEPARTMENT_OTHER): Payer: Medicare Other

## 2013-08-17 VITALS — BP 117/66 | HR 90 | Temp 98.8°F | Resp 18 | Ht 64.0 in | Wt 178.4 lb

## 2013-08-17 DIAGNOSIS — C50919 Malignant neoplasm of unspecified site of unspecified female breast: Secondary | ICD-10-CM

## 2013-08-17 DIAGNOSIS — C50519 Malignant neoplasm of lower-outer quadrant of unspecified female breast: Secondary | ICD-10-CM

## 2013-08-17 DIAGNOSIS — C7952 Secondary malignant neoplasm of bone marrow: Secondary | ICD-10-CM

## 2013-08-17 DIAGNOSIS — R209 Unspecified disturbances of skin sensation: Secondary | ICD-10-CM

## 2013-08-17 DIAGNOSIS — C50512 Malignant neoplasm of lower-outer quadrant of left female breast: Secondary | ICD-10-CM

## 2013-08-17 DIAGNOSIS — C773 Secondary and unspecified malignant neoplasm of axilla and upper limb lymph nodes: Secondary | ICD-10-CM

## 2013-08-17 DIAGNOSIS — R079 Chest pain, unspecified: Secondary | ICD-10-CM

## 2013-08-17 DIAGNOSIS — C7951 Secondary malignant neoplasm of bone: Secondary | ICD-10-CM

## 2013-08-17 DIAGNOSIS — M79603 Pain in arm, unspecified: Secondary | ICD-10-CM

## 2013-08-17 DIAGNOSIS — Z170 Estrogen receptor positive status [ER+]: Secondary | ICD-10-CM

## 2013-08-17 DIAGNOSIS — R0781 Pleurodynia: Secondary | ICD-10-CM

## 2013-08-17 DIAGNOSIS — M25551 Pain in right hip: Secondary | ICD-10-CM

## 2013-08-17 DIAGNOSIS — M549 Dorsalgia, unspecified: Secondary | ICD-10-CM

## 2013-08-17 LAB — CBC WITH DIFFERENTIAL/PLATELET
BASO%: 0.6 % (ref 0.0–2.0)
Basophils Absolute: 0 10*3/uL (ref 0.0–0.1)
EOS ABS: 0.3 10*3/uL (ref 0.0–0.5)
EOS%: 7.4 % — ABNORMAL HIGH (ref 0.0–7.0)
HCT: 35.3 % (ref 34.8–46.6)
HGB: 11.7 g/dL (ref 11.6–15.9)
LYMPH#: 0.9 10*3/uL (ref 0.9–3.3)
LYMPH%: 20.5 % (ref 14.0–49.7)
MCH: 29.1 pg (ref 25.1–34.0)
MCHC: 33.1 g/dL (ref 31.5–36.0)
MCV: 87.9 fL (ref 79.5–101.0)
MONO#: 0.5 10*3/uL (ref 0.1–0.9)
MONO%: 12.2 % (ref 0.0–14.0)
NEUT%: 59.3 % (ref 38.4–76.8)
NEUTROS ABS: 2.5 10*3/uL (ref 1.5–6.5)
Platelets: 222 10*3/uL (ref 145–400)
RBC: 4.01 10*6/uL (ref 3.70–5.45)
RDW: 15.4 % — AB (ref 11.2–14.5)
WBC: 4.2 10*3/uL (ref 3.9–10.3)

## 2013-08-17 LAB — COMPREHENSIVE METABOLIC PANEL (CC13)
ALK PHOS: 151 U/L — AB (ref 40–150)
ALT: 37 U/L (ref 0–55)
AST: 40 U/L — ABNORMAL HIGH (ref 5–34)
Albumin: 3.4 g/dL — ABNORMAL LOW (ref 3.5–5.0)
Anion Gap: 7 mEq/L (ref 3–11)
BILIRUBIN TOTAL: 0.32 mg/dL (ref 0.20–1.20)
BUN: 11.9 mg/dL (ref 7.0–26.0)
CO2: 24 mEq/L (ref 22–29)
CREATININE: 1.1 mg/dL (ref 0.6–1.1)
Calcium: 9.8 mg/dL (ref 8.4–10.4)
Chloride: 107 mEq/L (ref 98–109)
Glucose: 97 mg/dl (ref 70–140)
Potassium: 4.4 mEq/L (ref 3.5–5.1)
Sodium: 138 mEq/L (ref 136–145)
TOTAL PROTEIN: 6.5 g/dL (ref 6.4–8.3)

## 2013-08-17 MED ORDER — SODIUM CHLORIDE 0.9 % IV SOLN
Freq: Once | INTRAVENOUS | Status: AC
  Administered 2013-08-17: 15:00:00 via INTRAVENOUS

## 2013-08-17 MED ORDER — ZOLEDRONIC ACID 4 MG/100ML IV SOLN
4.0000 mg | Freq: Once | INTRAVENOUS | Status: AC
  Administered 2013-08-17: 4 mg via INTRAVENOUS
  Filled 2013-08-17: qty 100

## 2013-08-17 NOTE — Patient Instructions (Signed)

## 2013-08-17 NOTE — Progress Notes (Signed)
ID: Brooke [name]   DOB: Oct 03, 1950  MR#: [id number]  URK#:[id number]  PCP: Irven Shelling, MD GYN: SU:  OTHER MD: Thea Silversmith, Gaynelle Arabian, [name] [phone]), Willa Rough DDS  CHIEF COMPLAINT: stage IV breast cancer CURRENT THERAPY: zolendronate, low-dose capecitabine  BREAST CANCER HISTORY: From the January 2011 summary:  [name] had screening mammography in August 04, 2007 which showed no specific mammographic evidence of malignancy, but she reported a left breast lump.  Accordingly, she was brought back on August 11, 2007 for mammography and ultrasonography.  There was indeed an obscured mass in the upper left breast which by ultrasound appeared to be a simple cyst.    Screening mammogram on August 04, 2008 showed in addition to dense breasts, again a possible mass in the left breast.  She had diagnostic mammography August 06, 2008 and additional views in addition to very dense breasts did not show any persistent mass or distortion.  Furthermore, Dr. Owens Shark was not able to palpate any abnormality in the lateral portion of the left breast.  Ultrasound showed normal appearing fibroglandular tissue in the area in question.    More recently, about September, the patient felt again something like a lump in the left breast and she had some pain associated with this.  This was initially felt simply to be mastalgia and was treated as such, but as it persisted, on December 23rd, the patient had left diagnostic mammography and ultrasonography.  There was a focally dense area in the left breast with very minimal distortion corresponding to the area of palpable abnormality. The ultrasound showed an irregular ill-defined, hypoechoic area with distal shadowing, measuring approximately 1 cm corresponding to the patient's palpable abnormality.    With this information, the patient was brought back for ultrasound-guided core biopsy and this was performed December 28th. 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  There was no evidence of angiolymphatic invasion in the material submitted which measured 1.5 cm maximally.  In addition to the mass in the breast, a lymph node in the left axilla was biopsied and was likewise positive.  The tumor was ER positive at 89%, PR positive at 34% with an elevated MIB-1 at 44% and Her-2 negative with a ratio of 1.13.    Her subsequent history is as detailed below.  INTERVAL HISTORY: Brooke [name] returns for followup of her metastatic breast cancer accompanied by her husband [name]. She is doing "fine". She is walking with [name] about a mile a day. Her daughter Brooke [name] was visiting and that was a big boost. She has fully recovered from her reactions to capecitabine and now wonders what the next that may be.  REVIEW OF SYSTEMS: Her pain is much better controlled on a stable dose of oxycodone, one tablet 4 times a day, rarely 5 times a day. She is not constipated from this and also is having no problems with diarrhea. There have been no skin changes. The pain involves the ribs legs neck and back. The only place she has any numbness in her is her left big toe. A detailed review of systems today was otherwise stable  PAST MEDICAL HISTORY: Past Medical History  Diagnosis Date  . Hx Breast Cancer, IDC, Stahe III, receptor + 10/20/2010    left breast  . Breast cancer December 2010    invasive ductal and invasive lobular; bilateral mastectomy and radiation  . Mitral valve prolapse   . History of recurrent UTIs   .  Radiation 07/26/09-09/08/09    left breast  . Fracture, pelvis closed 11/2012    both hipsand number 7 rib, 15 rounds radiation  1. Remote migraines, currently inactive.   2. History of prior diagnosis of fibromyalgia made at [facility] and currently not active (she was treated with trimethoprim and nortriptyline for one year for this).    3. History of recurrent UTIs.    4. History of benign left breast cyst aspirated in 2004 at [facility] under Dr. Melene Plan.   5. History of tonsillectomy and adenoidectomy.   6. History of right LASEK surgery. 7. History of bilateral reduction mammoplasties in 1994.   8. History of mitral valve prolapse diagnosed in 1984.  PAST SURGICAL HISTORY: Past Surgical History  Procedure Laterality Date  . Mastectomy Bilateral 2011    left breast cancer  . Lasik    . Reduction mammaplasty Bilateral 1994  . Tonsillectomy and adenoidectomy    . Aspiration of left breast Left 2004    [facility]/Dr.Teo  . Bone biopsy  02/19/13    mets from breast cancer-receptors results not back    FAMILY HISTORY Family History  Problem Relation Age of Onset  . Lung cancer Father 38    smoker for 25 years  . Heart disease Maternal Grandmother   . Heart disease Maternal Grandfather   . Diabetes type I Mother   . Diabetes type I Sister   . Diabetes type I Sister   . Heart disease Maternal Uncle   . Uterine cancer Paternal Grandmother 10  . Breast cancer Other 31    paternal great grandmother  [name] died at the age of 23 from lung cancer.  He had been a remote smoker and had been in [employer] with a question of possible asbestos exposure.  [name] died at the age of 26 in the setting of Alzheimer's, coronary artery disease, diabetes and hypertension.  The patient had two sisters.  One died from complications of diabetes.  The other one is alive with diabetes.    GYNECOLOGIC HISTORY: She is GX P1.  First pregnancy to term at age 81.  The patient's last period was in 2008.  She took hormone replacement for about 18 months until mid-2008.  She had no complications from that treatment.    SOCIAL HISTORY: (Updated October 2014) She used to be a Arts development officer for [employer]  They used to live in the [place] area. 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  She is now  retired.  Her husband, [name], retired October 2012.  He was a Arts development officer for 28 years.  Their son, [name], is currently with the [employer] at the [employer] in [place]. [name].  She attends [employer].   ADVANCED DIRECTIVES: in place  HEALTH MAINTENANCE: (Updated 11/19/2012) History  Substance Use Topics  . Smoking status: Former Smoker -- 1.00 packs/day for 11 years    Quit date: 01/23/1984  . Smokeless tobacco: Never Used  . Alcohol Use: 1.5 - 2.5 oz/week    3-5 drink(s) per week     Comment: glasses of wine     Colonoscopy: 2010  PAP: Jan 2014  Bone density: July 2014, osteopenia  Lipid panel: per Dr Valentina Lucks  Allergies  Allergen Reactions  . Sulfa Antibiotics Shortness Of Breath  . Codeine Nausea And Vomiting    Tolerates oxycodone if given with prochlorperazine for  nausea  . Morphine And Related Nausea And Vomiting    Per patient possibility that the IR morphine was causing the vomiting   Current outpatient prescriptions:ALPRAZolam (XANAX XR) 1 MG 24 hr tablet, Take 1 mg by mouth. Up to twice daily as needed for anxiety, Disp: , Rfl: ;  Calcium Citrate (CITRACAL PO), Take 3 tablets by mouth daily., Disp: , Rfl: ;  cholecalciferol (VITAMIN D) 400 UNITS TABS tablet, Take 400 Units by mouth daily. Patient reportd the Vitamin E should be vitamin D with same dose, Disp: , Rfl:  diphenhydrAMINE (BENADRYL) 25 mg capsule, Take 25 mg by mouth at bedtime as needed for sleep. , Disp: , Rfl: ;  Docusate Calcium (STOOL SOFTENER PO), Take by mouth daily., Disp: , Rfl: ;  gabapentin (NEURONTIN) 300 MG capsule, Take 300 mg by mouth at bedtime. , Disp: , Rfl:  HYDROmorphone (DILAUDID) 2 MG tablet, Take 1 tablet (2 mg total) by mouth every 4 (four) hours as needed for moderate pain or severe pain (Pt is taking 78m (two, 231mtabs) at each dose)., Disp: 180 tablet, Rfl: 0;  Polyethyl Glycol-Propyl Glycol (SYSTANE) 0.4-0.3 % SOLN, Apply 1 drop  to eye 2 (two) times daily as needed (dry eyes)., Disp: , Rfl: ;  Polyethylene Glycol 3350 (MIRALAX PO), Take by mouth daily., Disp: , Rfl:  prochlorperazine (COMPAZINE) 5 MG tablet, Take 5 mg by mouth 3 (three) times daily., Disp: , Rfl: ;  Vaginal Lubricant (REPLENS VA), Place vaginally. Combining with estroglide, Disp: , Rfl: ;  venlafaxine XR (EFFEXOR-XR) 75 MG 24 hr capsule, Take 2 capsules (150 mg total) by mouth daily with breakfast., Disp: 180 capsule, Rfl: 1;  Zoledronic Acid (ZOMETA IV), Inject into the vein every 30 (thirty) days., Disp: , Rfl:   OBJECTIVE: Middle-aged white woman who appears stated age  Fi18itals:   08/17/13 1352  BP: 117/66  Pulse: 90  Temp: 98.8 F (37.1 C)  Resp: 18   Sclerae unicteric, pupils round and equal Oropharynx clear and moist No cervical or supraclavicular adenopathy Lungs no rales or rhonchi Heart regular rate and rhythm Abd soft, nontender, positive bowel sounds, no masses palpated MSK no focal spinal tenderness, no upper extremity lymphedema; Neuro: nonfocal, well oriented, appropriate affect Breasts: Deferred   LAB RESULTS:  Lab Results  Component Value Date   WBC 4.2 08/17/2013   NEUTROABS 2.5 08/17/2013   HGB 11.7 08/17/2013   HCT 35.3 08/17/2013   MCV 87.9 08/17/2013   PLT 222 08/17/2013      Chemistry      Component Value Date/Time   NA 138 08/17/2013 1317   NA 141 03/29/2013 0350   NA 142 06/26/2011 0728   K 4.4 08/17/2013 1317   K 3.7 03/29/2013 0350   K 4.6 06/26/2011 0728   CL 111 03/29/2013 0350   CL 105 01/07/2012 1347   CL 102 06/26/2011 0728   CO2 24 08/17/2013 1317   CO2 18* 03/29/2013 0350   CO2 30 06/26/2011 0728   BUN 11.9 08/17/2013 1317   BUN 10 03/29/2013 0350   BUN 12 06/26/2011 0728   CREATININE 1.1 08/17/2013 1317   CREATININE 0.72 03/29/2013 0350   CREATININE 1.1 06/26/2011 0728      Component Value Date/Time   CALCIUM 9.8 08/17/2013 1317   CALCIUM 7.8* 03/29/2013 0350   CALCIUM 9.1 06/26/2011 0728   ALKPHOS 151* 08/17/2013  1317   ALKPHOS 91 04/30/2013 1301   AST 40* 08/17/2013 1317   AST 67*  04/30/2013 1301   ALT 37 08/17/2013 1317   ALT 93* 04/30/2013 1301   BILITOT 0.32 08/17/2013 1317   BILITOT 0.4 04/30/2013 1301       Lab Results  Component Value Date   LABCA2 474* 07/27/2013    STUDIES: Nm Pet Image Restag (ps) Skull Base To Thigh  08/12/2013   CLINICAL DATA:  Subsequent treatment strategy for breast carcinoma. Skeletal metastasis.  EXAM: NUCLEAR MEDICINE PET SKULL BASE TO THIGH  TECHNIQUE: 10.3 mCi F-18 FDG was injected intravenously. Full-ring PET imaging was performed from the skull base to thigh after the radiotracer. CT data was obtained and used for attenuation correction and anatomic localization.  FASTING BLOOD GLUCOSE:  Value: 99 mg/dl  COMPARISON:  PET-CT scan 06/02/1998 and few from 03/23/2000, 02/02/2013  FINDINGS: NECK  No hypermetabolic lymph nodes in the neck.  CHEST  No hypermetabolic mediastinal or hilar nodes. No suspicious pulmonary nodules on the CT scan.  ABDOMEN/PELVIS  No abnormal hypermetabolic activity within the liver, pancreas, or spleen. No hypermetabolic lymph nodes in the abdomen or pelvis.  There is new mild metabolic activity associated with the right adrenal gland with SUV max equal 3 .6. No mass lesions identified.  SKELETON  Again demonstrated multiple foci of hypermetabolic activity throughout the axillary and appendicular skeleton associated with mixed lytic and sclerotic lesions. The metabolic activity stable to slightly decreased. For example lesion at L2 with SUV max 9.8 compares to 9.8 on most prior. Lesion in the medial aspect of the right clavicle as SUV max 6.3 decrease in 7.4 on prior. Lesion in the right iliac bone and [place] max 5.3 compared to 7.9 on prior. No new lesions are identified.  IMPRESSION: 1. Stable to mild decrease in metabolic activity associated multiple skeletal least suggests a mild positive response. Of note on prior exam, the increased metabolic activity likely  represented a mild progression of disease. 2. No evidence of new of skeletal metastasis. 3. No evidence of metastasis to the soft tissues in the chest, abdomen, or pelvis. 4. New mild uptake within the right adrenal gland without mass lesion. Recommend close attention on follow-up.   Electronically Signed   By: Suzy Bouchard M.D.   On: 08/12/2013 10:15    ASSESSMENT: 63 y.o. [place] woman   (1)  status post Left breast and Left axillary lymph node biopsy Dec 2010 for a cT3 pN1 (stage IIIA) invasive ductal carcinoma, grade 2, estrogen and progesterone receptor positive, HER-2 negative, with an MIB-1 of 44%;   (2)  treated neoadjuvantly with dose dense cyclophosphamide and doxorubicin x4 followed by weekly paclitaxel x7  (3) s/p bilateral mastectomies May 2011. She had a residual 1.5 cm tumor, and one of 8 sampled lymph nodes was positive [ypT1c ypN1]. Margins were negative.   (4)  She completed post-mastectomy radiation in August of 2011   (5) on letrozole between August 2011 and October 2014  (6) PET scan 11/05/2012 obtained to evaluate right chest wall pain documents bony metastatic disease to the right seventh rib, L1, and the right acetabulum with an associated pathologic fracture through the anterior cortex of the acetabulum measuring 3.2 cm. There was no evidence of extra osseous metastatic disease.  (7) starting 11/05/2012 a received fulvestrant, 500 mg monthly, in addition to monthly infusions of zoledronic acid. The fulvestrant was discontinued May 2015 with evidence of progression  (8) Right rib, left hip and right hip were all treated to 37.5 Gy in 15 fractions at 2.5 Gy per fraction completed  12/17/2012  (9) 02/19/2013 bone marrow biopsy confirms metastatic disease to bone. HER-2 was not done because of concerns regarding because of fine the specimen. Estrogen and progesterone receptors were read as negative. This i was felt to be possibly discordant.  (10) patient's tumor  carries a PIK3 mutation as documented at [facility]  (11) L ribcage pain: s/p palliative XRT completed 06/16/2013  (12) declined participation in a capecitabine plus PIK 3 inhibitor at [facility]; started capecitabine alone 06/27/2013, discontinued after one week because of side effects; resumed 08/17/2013 at 500 mg twice a day one-week on one-week off  (13) radiation to Right sacrum/ lower lumbar spine 06/10 to 06/23    PLAN:  Brooke [name] is feeling a bit better. The idea of taking chemotherapy for the rest of her life is horrifying to her and "I don't want to feel bad the rest of my life". On the other hand she may have actually had some response to her initial dose of capecitabine as we read in the latest scan.  After much discussion we decided we would try capecitabine at 500 mg twice a day. She will take this for one week and then be off a week. If she tolerates it well, we will try to go to a thousand milligrams in the morning and 500 in the evening. If we can get her to 1000 mg twice a day, we would continue that for 3 months and then restage.  Though reluctantly, Brooke [name] has agreed to this plan. If she develops any significant problems she will immediately call. I am going to see her again August 7 to discuss this further.  We also went over the recent data as bad dosing zolendronate every 3 months in situations like hers is just as effective as monthly. She did receive zolendronate today but her next dose will not be until October  MAGRINAT,GUSTAV C, MD  08/17/2013

## 2013-08-17 NOTE — Telephone Encounter (Signed)
per pof to sch pt appt-sch & gave pt copy of sch °

## 2013-08-17 NOTE — Telephone Encounter (Signed)
per pof to sch appt-Meliisa did override for 8:00 sch lab-pt stated has MY CHART and will view appt from there

## 2013-08-18 LAB — CEA: CEA: 148.1 ng/mL — AB (ref 0.0–5.0)

## 2013-08-18 LAB — CANCER ANTIGEN 27.29: CA 27.29: 449 U/mL — ABNORMAL HIGH (ref 0–39)

## 2013-08-18 NOTE — Addendum Note (Signed)
Addended by: Amelia Jo I on: 08/18/2013 02:01 PM   Modules accepted: Medications

## 2013-08-28 ENCOUNTER — Other Ambulatory Visit: Payer: Self-pay | Admitting: *Deleted

## 2013-08-28 ENCOUNTER — Other Ambulatory Visit (HOSPITAL_BASED_OUTPATIENT_CLINIC_OR_DEPARTMENT_OTHER): Payer: Medicare Other

## 2013-08-28 ENCOUNTER — Ambulatory Visit (HOSPITAL_BASED_OUTPATIENT_CLINIC_OR_DEPARTMENT_OTHER): Payer: Medicare Other | Admitting: Oncology

## 2013-08-28 ENCOUNTER — Telehealth: Payer: Self-pay | Admitting: Oncology

## 2013-08-28 VITALS — BP 138/75 | HR 93 | Temp 99.0°F | Resp 18 | Ht 64.0 in | Wt 178.1 lb

## 2013-08-28 DIAGNOSIS — Z170 Estrogen receptor positive status [ER+]: Secondary | ICD-10-CM

## 2013-08-28 DIAGNOSIS — C50519 Malignant neoplasm of lower-outer quadrant of unspecified female breast: Secondary | ICD-10-CM

## 2013-08-28 DIAGNOSIS — M79603 Pain in arm, unspecified: Secondary | ICD-10-CM

## 2013-08-28 DIAGNOSIS — C7952 Secondary malignant neoplasm of bone marrow: Secondary | ICD-10-CM

## 2013-08-28 DIAGNOSIS — C7951 Secondary malignant neoplasm of bone: Secondary | ICD-10-CM

## 2013-08-28 DIAGNOSIS — C50919 Malignant neoplasm of unspecified site of unspecified female breast: Secondary | ICD-10-CM

## 2013-08-28 DIAGNOSIS — C50512 Malignant neoplasm of lower-outer quadrant of left female breast: Secondary | ICD-10-CM

## 2013-08-28 DIAGNOSIS — C773 Secondary and unspecified malignant neoplasm of axilla and upper limb lymph nodes: Secondary | ICD-10-CM

## 2013-08-28 DIAGNOSIS — R079 Chest pain, unspecified: Secondary | ICD-10-CM

## 2013-08-28 LAB — COMPREHENSIVE METABOLIC PANEL (CC13)
ALBUMIN: 3.6 g/dL (ref 3.5–5.0)
ALK PHOS: 166 U/L — AB (ref 40–150)
ALT: 27 U/L (ref 0–55)
AST: 38 U/L — AB (ref 5–34)
Anion Gap: 9 mEq/L (ref 3–11)
BUN: 12.7 mg/dL (ref 7.0–26.0)
CALCIUM: 9.7 mg/dL (ref 8.4–10.4)
CHLORIDE: 106 meq/L (ref 98–109)
CO2: 26 mEq/L (ref 22–29)
Creatinine: 1 mg/dL (ref 0.6–1.1)
Glucose: 110 mg/dl (ref 70–140)
Potassium: 4.7 mEq/L (ref 3.5–5.1)
SODIUM: 141 meq/L (ref 136–145)
TOTAL PROTEIN: 6.7 g/dL (ref 6.4–8.3)
Total Bilirubin: 0.41 mg/dL (ref 0.20–1.20)

## 2013-08-28 LAB — CBC WITH DIFFERENTIAL/PLATELET
BASO%: 0.7 % (ref 0.0–2.0)
Basophils Absolute: 0 10*3/uL (ref 0.0–0.1)
EOS%: 6.7 % (ref 0.0–7.0)
Eosinophils Absolute: 0.3 10*3/uL (ref 0.0–0.5)
HCT: 36.1 % (ref 34.8–46.6)
HGB: 12 g/dL (ref 11.6–15.9)
LYMPH#: 0.8 10*3/uL — AB (ref 0.9–3.3)
LYMPH%: 20.4 % (ref 14.0–49.7)
MCH: 29.2 pg (ref 25.1–34.0)
MCHC: 33.1 g/dL (ref 31.5–36.0)
MCV: 88.3 fL (ref 79.5–101.0)
MONO#: 0.5 10*3/uL (ref 0.1–0.9)
MONO%: 12.8 % (ref 0.0–14.0)
NEUT#: 2.3 10*3/uL (ref 1.5–6.5)
NEUT%: 59.4 % (ref 38.4–76.8)
Platelets: 221 10*3/uL (ref 145–400)
RBC: 4.09 10*6/uL (ref 3.70–5.45)
RDW: 15.7 % — ABNORMAL HIGH (ref 11.2–14.5)
WBC: 3.8 10*3/uL — AB (ref 3.9–10.3)

## 2013-08-28 LAB — CEA: CEA: 134 ng/mL — AB (ref 0.0–5.0)

## 2013-08-28 LAB — CANCER ANTIGEN 27.29: CA 27.29: 394 U/mL — AB (ref 0–39)

## 2013-08-28 MED ORDER — ONDANSETRON HCL 8 MG PO TABS: 8.0000 mg | ORAL_TABLET | Freq: Two times a day (BID) | ORAL | Status: DC | PRN

## 2013-08-28 MED ORDER — CHOLESTYRAMINE 4 G PO PACK: 4.0000 g | PACK | Freq: Two times a day (BID) | ORAL | Status: DC | PRN

## 2013-08-28 NOTE — Telephone Encounter (Signed)
cld & left pt a messsage in regards to upcoming appt-adv to call if any questions [phone]

## 2013-08-28 NOTE — Progress Notes (Signed)
ID: Brooke [name]   DOB: Mar 21, 1950  MR#: [id number]  YTK#:[id number]  PCP: Irven Shelling, MD GYN: SU:  OTHER MD: Thea Silversmith, Gaynelle Arabian, [name] [phone]), Willa Rough DDS  CHIEF COMPLAINT: stage IV breast cancer CURRENT THERAPY: zolendronate, low-dose capecitabine  BREAST CANCER HISTORY: From the January 2011 summary:  Brooke [name] had screening mammography in August 04, 2007 which showed no specific mammographic evidence of malignancy, but she reported a left breast lump.  Accordingly, she was brought back on August 11, 2007 for mammography and ultrasonography.  There was indeed an obscured mass in the upper left breast which by ultrasound appeared to be a simple cyst.    Screening mammogram on August 04, 2008 showed in addition to dense breasts, again a possible mass in the left breast.  She had diagnostic mammography August 06, 2008 and additional views in addition to very dense breasts did not show any persistent mass or distortion.  Furthermore, Dr. Owens Shark was not able to palpate any abnormality in the lateral portion of the left breast.  Ultrasound showed normal appearing fibroglandular tissue in the area in question.    More recently, about September, the patient felt again something like a lump in the left breast and she had some pain associated with this.  This was initially felt simply to be mastalgia and was treated as such, but as it persisted, on December 23rd, the patient had left diagnostic mammography and ultrasonography.  There was a focally dense area in the left breast with very minimal distortion corresponding to the area of palpable abnormality. The ultrasound showed an irregular ill-defined, hypoechoic area with distal shadowing, measuring approximately 1 cm corresponding to the patient's palpable abnormality.    With this information, the patient was brought back for ultrasound-guided core biopsy and this was performed December 28th. 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  There was no evidence of angiolymphatic invasion in the material submitted which measured 1.5 cm maximally.  In addition to the mass in the breast, a lymph node in the left axilla was biopsied and was likewise positive.  The tumor was ER positive at 89%, PR positive at 34% with an elevated MIB-1 at 44% and Her-2 negative with a ratio of 1.13.    Her subsequent history is as detailed below.  INTERVAL HISTORY: Brooke [name] returns for followup of her metastatic breast cancer accompanied by her husband Brooke [name]. Today is day 12 cycle 1 of capecitabine which we are dosing at 500 mg by mouth twice a day.   REVIEW OF SYSTEMS: Brooke [name] did well the first 4 days. On day 5 she had nausea, although she did not vomit. She took the Compazine only twice daily. They 6 was bearable. They 7 was "horrible". She developed diarrhea, "all day". She did not take Imodium or Questran area she also had significant nausea that day. Day 8 she recovered on day 9 she was back to baseline except her pain was actually worse. She suspects that possibly the chemotherapy was having some effect on the cancer. She is currently taking it I wanted about 5 times daily. Incidentally this does not constipate her. She takes MiraLAX and stool softeners and she continued to take them you been on the day when she had diarrhea. Aside from all the above she developed a sore in the left perineum that she wanted me to look at today. A detailed review of systems is otherwise noncontributory  PAST MEDICAL  HISTORY: Past Medical History  Diagnosis Date  . Hx Breast Cancer, IDC, Stahe III, receptor + 10/20/2010    left breast  . Breast cancer December 2010    invasive ductal and invasive lobular; bilateral mastectomy and radiation  . Mitral valve prolapse   . History of recurrent UTIs   . Radiation 07/26/09-09/08/09    left breast  . Fracture, pelvis closed 11/2012    both hipsand  number 7 rib, 15 rounds radiation  1. Remote migraines, currently inactive.   2. History of prior diagnosis of fibromyalgia made at [facility] and currently not active (she was treated with trimethoprim and nortriptyline for one year for this).    3. History of recurrent UTIs.   4. History of benign left breast cyst aspirated in 2004 at [facility] under Dr. Melene Plan.   5. History of tonsillectomy and adenoidectomy.   6. History of right LASEK surgery. 7. History of bilateral reduction mammoplasties in 1994.   8. History of mitral valve prolapse diagnosed in 1984.  PAST SURGICAL HISTORY: Past Surgical History  Procedure Laterality Date  . Mastectomy Bilateral 2011    left breast cancer  . Lasik    . Reduction mammaplasty Bilateral 1994  . Tonsillectomy and adenoidectomy    . Aspiration of left breast Left 2004    [facility]/Dr.Teo  . Bone biopsy  02/19/13    mets from breast cancer-receptors results not back    FAMILY HISTORY Family History  Problem Relation Age of Onset  . Lung cancer Father 24    smoker for 25 years  . Heart disease Maternal Grandmother   . Heart disease Maternal Grandfather   . Diabetes type I Mother   . Diabetes type I Sister   . Diabetes type I Sister   . Heart disease Maternal Uncle   . Uterine cancer Paternal Grandmother 82  . Breast cancer Other 22    paternal great grandmother  [name] died at the age of 18 from lung cancer.  He had been a remote smoker and had been in [employer] with a question of possible asbestos exposure.  [name] died at the age of 24 in the setting of Alzheimer's, coronary artery disease, diabetes and hypertension.  The patient had two sisters.  One died from complications of diabetes.  The other one is alive with diabetes.    GYNECOLOGIC HISTORY: She is GX P1.  First pregnancy to term at age 63.  The patient's last period was in 2008.  She took hormone  replacement for about 18 months until mid-2008.  She had no complications from that treatment.    SOCIAL HISTORY: (Updated October 2014) She used to be a Arts development officer for [employer]  They used to live in the [place] area. 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  Her husband, Brooke [name], retired October 2012.  He was a Arts development officer for 28 years.  Their son, [name], is currently with the [employer] at the [employer] in [place]. [name].  She attends [employer].   ADVANCED DIRECTIVES: in place  HEALTH MAINTENANCE: (Updated 11/19/2012) History  Substance Use Topics  . Smoking status: Former Smoker -- 1.00 packs/day for 11 years    Quit date: 01/23/1984  . Smokeless tobacco: Never Used  . Alcohol Use: 1.5 - 2.5 oz/week    3-5 drink(s) per week  Comment: glasses of wine     Colonoscopy: 2010  PAP: Jan 2014  Bone density: July 2014, osteopenia  Lipid panel: per Dr Valentina Lucks  Allergies  Allergen Reactions  . Sulfa Antibiotics Shortness Of Breath  . Codeine Nausea And Vomiting    Tolerates oxycodone if given with prochlorperazine for nausea  . Morphine And Related Nausea And Vomiting    Per patient possibility that the IR morphine was causing the vomiting   Current outpatient prescriptions:ALPRAZolam (XANAX XR) 1 MG 24 hr tablet, Take 1 mg by mouth. Up to twice daily as needed for anxiety, Disp: , Rfl: ;  Calcium Citrate (CITRACAL PO), Take 3 tablets by mouth daily., Disp: , Rfl: ;  cholecalciferol (VITAMIN D) 400 UNITS TABS tablet, Take 400 Units by mouth daily. Patient reportd the Vitamin E should be vitamin D with same dose, Disp: , Rfl:  diphenhydrAMINE (BENADRYL) 25 mg capsule, Take 25 mg by mouth at bedtime as needed for sleep. , Disp: , Rfl: ;  Docusate Calcium (STOOL SOFTENER PO), Take by mouth daily., Disp: , Rfl: ;  gabapentin (NEURONTIN) 300 MG capsule, Take 300 mg by mouth at bedtime.  , Disp: , Rfl:  HYDROmorphone (DILAUDID) 2 MG tablet, Take 1 tablet (2 mg total) by mouth every 4 (four) hours as needed for moderate pain or severe pain (Pt is taking 14m (two, 243mtabs) at each dose)., Disp: 180 tablet, Rfl: 0;  Polyethyl Glycol-Propyl Glycol (SYSTANE) 0.4-0.3 % SOLN, Apply 1 drop to eye 2 (two) times daily as needed (dry eyes)., Disp: , Rfl: ;  Polyethylene Glycol 3350 (MIRALAX PO), Take by mouth daily., Disp: , Rfl:  prochlorperazine (COMPAZINE) 5 MG tablet, Take 5 mg by mouth 3 (three) times daily., Disp: , Rfl: ;  Vaginal Lubricant (REPLENS VA), Place vaginally. Combining with estroglide, Disp: , Rfl: ;  venlafaxine XR (EFFEXOR-XR) 75 MG 24 hr capsule, Take 2 capsules (150 mg total) by mouth daily with breakfast., Disp: 180 capsule, Rfl: 1;  Zoledronic Acid (ZOMETA IV), Inject into the vein every 30 (thirty) days., Disp: , Rfl:   OBJECTIVE: Middle-aged white woman who moves with some stiffness because of pain  Filed Vitals:   08/28/13 0753  BP: 138/75  Pulse: 93  Temp: 99 F (37.2 C)  Resp: 18   Sclerae unicteric, EOMs intact Oropharynx clear, no lesions noted No cervical or supraclavicular adenopathy Lungs no rales or rhonchi Heart regular rate and rhythm Abd soft, nontender, positive bowel sounds, no masses palpated MSK no focal spinal tenderness, no upper extremity lymphedema; Neuro: nonfocal, well oriented, positive affect Breasts: Deferred Skin: There is a healing erosion in the left perineal area measuring approximately 2 cm, which is epithelializing well   LAB RESULTS:  Lab Results  Component Value Date   WBC 4.2 08/17/2013   NEUTROABS 2.5 08/17/2013   HGB 11.7 08/17/2013   HCT 35.3 08/17/2013   MCV 87.9 08/17/2013   PLT 222 08/17/2013      Chemistry      Component Value Date/Time   NA 138 08/17/2013 1317   NA 141 03/29/2013 0350   NA 142 06/26/2011 0728   K 4.4 08/17/2013 1317   K 3.7 03/29/2013 0350   K 4.6 06/26/2011 0728   CL 111 03/29/2013 0350   CL  105 01/07/2012 1347   CL 102 06/26/2011 0728   CO2 24 08/17/2013 1317   CO2 18* 03/29/2013 0350   CO2 30 06/26/2011 0728   BUN 11.9 08/17/2013 1317  BUN 10 03/29/2013 0350   BUN 12 06/26/2011 0728   CREATININE 1.1 08/17/2013 1317   CREATININE 0.72 03/29/2013 0350   CREATININE 1.1 06/26/2011 0728      Component Value Date/Time   CALCIUM 9.8 08/17/2013 1317   CALCIUM 7.8* 03/29/2013 0350   CALCIUM 9.1 06/26/2011 0728   ALKPHOS 151* 08/17/2013 1317   ALKPHOS 91 04/30/2013 1301   AST 40* 08/17/2013 1317   AST 67* 04/30/2013 1301   ALT 37 08/17/2013 1317   ALT 93* 04/30/2013 1301   BILITOT 0.32 08/17/2013 1317   BILITOT 0.4 04/30/2013 1301       Lab Results  Component Value Date   LABCA2 449* 08/17/2013    STUDIES: Nm Pet Image Restag (ps) Skull Base To Thigh  08/12/2013   CLINICAL DATA:  Subsequent treatment strategy for breast carcinoma. Skeletal metastasis.  EXAM: NUCLEAR MEDICINE PET SKULL BASE TO THIGH  TECHNIQUE: 10.3 mCi F-18 FDG was injected intravenously. Full-ring PET imaging was performed from the skull base to thigh after the radiotracer. CT data was obtained and used for attenuation correction and anatomic localization.  FASTING BLOOD GLUCOSE:  Value: 99 mg/dl  COMPARISON:  PET-CT scan 06/02/1998 and few from 03/23/2000, 02/02/2013  FINDINGS: NECK  No hypermetabolic lymph nodes in the neck.  CHEST  No hypermetabolic mediastinal or hilar nodes. No suspicious pulmonary nodules on the CT scan.  ABDOMEN/PELVIS  No abnormal hypermetabolic activity within the liver, pancreas, or spleen. No hypermetabolic lymph nodes in the abdomen or pelvis.  There is new mild metabolic activity associated with the right adrenal gland with SUV max equal 3 .6. No mass lesions identified.  SKELETON  Again demonstrated multiple foci of hypermetabolic activity throughout the axillary and appendicular skeleton associated with mixed lytic and sclerotic lesions. The metabolic activity stable to slightly decreased. For example lesion at  L2 with SUV max 9.8 compares to 9.8 on most prior. Lesion in the medial aspect of the right clavicle as SUV max 6.3 decrease in 7.4 on prior. Lesion in the right iliac bone and [place] max 5.3 compared to 7.9 on prior. No new lesions are identified.  IMPRESSION: 1. Stable to mild decrease in metabolic activity associated multiple skeletal least suggests a mild positive response. Of note on prior exam, the increased metabolic activity likely represented a mild progression of disease. 2. No evidence of new of skeletal metastasis. 3. No evidence of metastasis to the soft tissues in the chest, abdomen, or pelvis. 4. New mild uptake within the right adrenal gland without mass lesion. Recommend close attention on follow-up.   Electronically Signed   By: Suzy Bouchard M.D.   On: 08/12/2013 10:15    ASSESSMENT: 63 y.o. [place] woman   (1)  status post Left breast and Left axillary lymph node biopsy Dec 2010 for a cT3 pN1 (stage IIIA) invasive ductal carcinoma, grade 2, estrogen and progesterone receptor positive, HER-2 negative, with an MIB-1 of 44%;   (2)  treated neoadjuvantly with dose dense cyclophosphamide and doxorubicin x4 followed by weekly paclitaxel x7  (3) s/p bilateral mastectomies May 2011. She had a residual 1.5 cm tumor, and one of 8 sampled lymph nodes was positive [ypT1c ypN1]. Margins were negative.   (4)  She completed post-mastectomy radiation in August of 2011   (5) on letrozole between August 2011 and October 2014  (6) PET scan 11/05/2012 obtained to evaluate right chest wall pain documents bony metastatic disease to the right seventh rib, L1, and the right acetabulum  with an associated pathologic fracture through the anterior cortex of the acetabulum measuring 3.2 cm. There was no evidence of extra osseous metastatic disease.  (7) starting 11/05/2012 a received fulvestrant, 500 mg monthly, in addition to monthly infusions of zoledronic acid. The fulvestrant was discontinued May 2015  with evidence of progression  (8) Right rib, left hip and right hip were all treated to 37.5 Gy in 15 fractions at 2.5 Gy per fraction completed 12/17/2012  (9) 02/19/2013 bone marrow biopsy confirms metastatic disease to bone. HER-2 was not done because of concerns regarding because of fine the specimen. Estrogen and progesterone receptors were read as negative. This i was felt to be possibly discordant.  (10) patient's tumor carries a PIK3 mutation as documented at [facility]  (11) L ribcage pain: s/p palliative XRT completed 06/16/2013  (12) declined participation in a capecitabine plus PIK 3 inhibitor at [facility]; started capecitabine alone 06/27/2013, discontinued after one week because of side effects; resumed 08/17/2013 at 500 mg twice a day one-week on one-week off  (13) radiation to Right sacrum/ lower lumbar spine 06/10 to 06/23    PLAN:  Brooke [name] did moderately well with her first week of low-dose capecitabine. I think we're going to have to make it a little bit better for her before we can increase the dose.  Accordingly she is going to start ondansetron on day 4 and take that twice a day for the next 3 days. On day 5 she will start prochlorperazine a.c. and at bedtime. She will continue that through day 8 or 9 depending on how she is doing as far as nausea is concerned.  On day 5 she will stop taking her MiraLAX and stool softeners. She will not resume those until they 10 assuming she has had no diarrhea by that time. If she does develop diarrhea, she will use Imodium, one tablet after each diarrheal bowel movements, maximum 6 a day; and she will also start Questran twice a day and she would continue the Questran for at least 3 days before stopping.  I think with these simple changes we can make for experience much better and if this takes care of the symptoms for the third cycle we will try to add an additional 500 mg to the morning.Brooke [name] has a good  understanding of the overall plan. She agrees with it. She knows the goal of treatment in her case is control. She will call with any problems that may develop before her next visit here.     Chauncey Cruel, MD  08/28/2013

## 2013-08-31 ENCOUNTER — Telehealth: Payer: Self-pay | Admitting: *Deleted

## 2013-08-31 ENCOUNTER — Telehealth: Payer: Self-pay

## 2013-08-31 MED ORDER — ALPRAZOLAM ER 1 MG PO TB24: ORAL_TABLET | ORAL | Status: DC

## 2013-08-31 NOTE — Telephone Encounter (Signed)
Dose change prescription faxed to [facility].  Sent to scan.

## 2013-08-31 NOTE — Telephone Encounter (Signed)
Received vm call from fri from pt stating she had a question.  Returned call @ 11:30 today & spoke with husband & he states that pt needs a refill on her xanax called to [facility] order.  They also had questions about next appt.  Clarified that Dr Jana Hakim wanted pt seen by Susanne Borders next visit & they are OK with this.  Xanax refill called to mail order pharm as requested.

## 2013-09-10 ENCOUNTER — Other Ambulatory Visit: Payer: Self-pay | Admitting: *Deleted

## 2013-09-10 ENCOUNTER — Other Ambulatory Visit (HOSPITAL_BASED_OUTPATIENT_CLINIC_OR_DEPARTMENT_OTHER): Payer: Medicare Other

## 2013-09-10 ENCOUNTER — Telehealth: Payer: Self-pay | Admitting: Nurse Practitioner

## 2013-09-10 ENCOUNTER — Ambulatory Visit (HOSPITAL_BASED_OUTPATIENT_CLINIC_OR_DEPARTMENT_OTHER): Payer: Medicare Other | Admitting: Nurse Practitioner

## 2013-09-10 ENCOUNTER — Telehealth: Payer: Self-pay | Admitting: *Deleted

## 2013-09-10 VITALS — BP 132/77 | HR 93 | Temp 98.5°F | Resp 20 | Ht 64.0 in | Wt 179.9 lb

## 2013-09-10 DIAGNOSIS — C50919 Malignant neoplasm of unspecified site of unspecified female breast: Secondary | ICD-10-CM

## 2013-09-10 DIAGNOSIS — R0789 Other chest pain: Secondary | ICD-10-CM | POA: Insufficient documentation

## 2013-09-10 DIAGNOSIS — R0781 Pleurodynia: Secondary | ICD-10-CM

## 2013-09-10 DIAGNOSIS — C7952 Secondary malignant neoplasm of bone marrow: Secondary | ICD-10-CM

## 2013-09-10 DIAGNOSIS — R079 Chest pain, unspecified: Secondary | ICD-10-CM

## 2013-09-10 DIAGNOSIS — C7951 Secondary malignant neoplasm of bone: Principal | ICD-10-CM

## 2013-09-10 DIAGNOSIS — C50519 Malignant neoplasm of lower-outer quadrant of unspecified female breast: Secondary | ICD-10-CM

## 2013-09-10 DIAGNOSIS — C773 Secondary and unspecified malignant neoplasm of axilla and upper limb lymph nodes: Secondary | ICD-10-CM

## 2013-09-10 DIAGNOSIS — C50512 Malignant neoplasm of lower-outer quadrant of left female breast: Secondary | ICD-10-CM

## 2013-09-10 DIAGNOSIS — Z170 Estrogen receptor positive status [ER+]: Secondary | ICD-10-CM

## 2013-09-10 LAB — CBC WITH DIFFERENTIAL/PLATELET
BASO%: 0.6 % (ref 0.0–2.0)
Basophils Absolute: 0 10*3/uL (ref 0.0–0.1)
EOS ABS: 0.4 10*3/uL (ref 0.0–0.5)
EOS%: 8.8 % — ABNORMAL HIGH (ref 0.0–7.0)
HCT: 37 % (ref 34.8–46.6)
HGB: 12 g/dL (ref 11.6–15.9)
LYMPH%: 21.6 % (ref 14.0–49.7)
MCH: 29.1 pg (ref 25.1–34.0)
MCHC: 32.3 g/dL (ref 31.5–36.0)
MCV: 90.1 fL (ref 79.5–101.0)
MONO#: 0.5 10*3/uL (ref 0.1–0.9)
MONO%: 13 % (ref 0.0–14.0)
NEUT%: 56 % (ref 38.4–76.8)
NEUTROS ABS: 2.4 10*3/uL (ref 1.5–6.5)
Platelets: 217 10*3/uL (ref 145–400)
RBC: 4.11 10*6/uL (ref 3.70–5.45)
RDW: 15.7 % — ABNORMAL HIGH (ref 11.2–14.5)
WBC: 4.2 10*3/uL (ref 3.9–10.3)
lymph#: 0.9 10*3/uL (ref 0.9–3.3)

## 2013-09-10 LAB — COMPREHENSIVE METABOLIC PANEL (CC13)
ALK PHOS: 151 U/L — AB (ref 40–150)
ALT: 33 U/L (ref 0–55)
AST: 41 U/L — ABNORMAL HIGH (ref 5–34)
Albumin: 3.5 g/dL (ref 3.5–5.0)
Anion Gap: 8 mEq/L (ref 3–11)
BILIRUBIN TOTAL: 0.29 mg/dL (ref 0.20–1.20)
BUN: 11.3 mg/dL (ref 7.0–26.0)
CO2: 26 mEq/L (ref 22–29)
CREATININE: 1.1 mg/dL (ref 0.6–1.1)
Calcium: 9.6 mg/dL (ref 8.4–10.4)
Chloride: 105 mEq/L (ref 98–109)
Glucose: 91 mg/dl (ref 70–140)
Potassium: 4.7 mEq/L (ref 3.5–5.1)
SODIUM: 139 meq/L (ref 136–145)
Total Protein: 6.5 g/dL (ref 6.4–8.3)

## 2013-09-10 MED ORDER — HYDROMORPHONE HCL 2 MG PO TABS: 2.0000 mg | ORAL_TABLET | ORAL | Status: DC | PRN

## 2013-09-10 NOTE — Telephone Encounter (Signed)
per pof to sch pt appt-sch & gave pt copy of sch-sent email to MW to sch trmt-adv pt once reply i will call w/appt time-gavd pt copy of sch

## 2013-09-10 NOTE — Telephone Encounter (Signed)
Per staff message and POF I have scheduled appts. Advised scheduler of appts. JMW  

## 2013-09-10 NOTE — Progress Notes (Signed)
ID: Brooke [name]   DOB: Dec 31, 1950  MR#: [id number]  TGY#:[id number]  PCP: Irven Shelling, MD GYN: SU:  OTHER MD: Thea Silversmith, Gaynelle Arabian, [name] [phone]), Willa Rough DDS  CHIEF COMPLAINT: stage IV breast cancer CURRENT THERAPY: zolendronate, low-dose capecitabine  BREAST CANCER HISTORY: From the January 2011 summary:  Brooke [name] had screening mammography in August 04, 2007 which showed no specific mammographic evidence of malignancy, but she reported a left breast lump.  Accordingly, she was brought back on August 11, 2007 for mammography and ultrasonography.  There was indeed an obscured mass in the upper left breast which by ultrasound appeared to be a simple cyst.    Screening mammogram on August 04, 2008 showed in addition to dense breasts, again a possible mass in the left breast.  She had diagnostic mammography August 06, 2008 and additional views in addition to very dense breasts did not show any persistent mass or distortion.  Furthermore, Dr. Owens Shark was not able to palpate any abnormality in the lateral portion of the left breast.  Ultrasound showed normal appearing fibroglandular tissue in the area in question.    More recently, about September, the patient felt again something like a lump in the left breast and she had some pain associated with this.  This was initially felt simply to be mastalgia and was treated as such, but as it persisted, on December 23rd, the patient had left diagnostic mammography and ultrasonography.  There was a focally dense area in the left breast with very minimal distortion corresponding to the area of palpable abnormality. The ultrasound showed an irregular ill-defined, hypoechoic area with distal shadowing, measuring approximately 1 cm corresponding to the patient's palpable abnormality.    With this information, the patient was brought back for ultrasound-guided core biopsy and this was performed December 28th. 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  There was no evidence of angiolymphatic invasion in the material submitted which measured 1.5 cm maximally.  In addition to the mass in the breast, a lymph node in the left axilla was biopsied and was likewise positive.  The tumor was ER positive at 89%, PR positive at 34% with an elevated MIB-1 at 44% and Her-2 negative with a ratio of 1.13.    Her subsequent history is as detailed below.  INTERVAL HISTORY: Brooke [name] returns for follow up of her metastatic breast cancer accompanied by her husband [name]. Today is day 11 cycle 2 of capecitabine which we are dosing at 500 mg by mouth twice a day. Again Brooke [name] did well on days 1-4. As previously recommended she ttook the ondansetron days 4-6. And followed up with compazine on days 5-9, 3-4 times per day. She stopped taking the miralax and stool softeners on day 5 and resumed when she felt able. This plan outlined from last visit worked very well for her. She experienced no diarrhea, and though she felt nauseous starting on day 5, it was well controlled and she experienced no vomiting.  REVIEW OF SYSTEMS: Brooke [name] has 5-8/10 left rib pain starting at about rib 6 or 7 and continues medially to her spine. This started a few days ago and she has noticed a need to take hydromorphone more often. A detailed review of systems was otherwise noncontributory.  PAST MEDICAL HISTORY: Past Medical History  Diagnosis Date  . Hx Breast Cancer, IDC, Stahe III, receptor + 10/20/2010    left breast  . Breast cancer December 2010  invasive ductal and invasive lobular; bilateral mastectomy and radiation  . Mitral valve prolapse   . History of recurrent UTIs   . Radiation 07/26/09-09/08/09    left breast  . Fracture, pelvis closed 11/2012    both hipsand number 7 rib, 15 rounds radiation  1. Remote migraines, currently inactive.   2. History of prior diagnosis of fibromyalgia made at [facility] and currently not active (she was treated with trimethoprim and nortriptyline for one year for this).    3. History of recurrent UTIs.   4. History of benign left breast cyst aspirated in 2004 at [facility] under Dr. Melene Plan.   5. History of tonsillectomy and adenoidectomy.   6. History of right LASEK surgery. 7. History of bilateral reduction mammoplasties in 1994.   8. History of mitral valve prolapse diagnosed in 1984.  PAST SURGICAL HISTORY: Past Surgical History  Procedure Laterality Date  . Mastectomy Bilateral 2011    left breast cancer  . Lasik    . Reduction mammaplasty Bilateral 1994  . Tonsillectomy and adenoidectomy    . Aspiration of left breast Left 2004    [facility]/Dr.Teo  . Bone biopsy  02/19/13    mets from breast cancer-receptors results not back    FAMILY HISTORY Family History  Problem Relation Age of Onset  . Lung cancer Father 38    smoker for 25 years  . Heart disease Maternal Grandmother   . Heart disease Maternal Grandfather   . Diabetes type I Mother   . Diabetes type I Sister   . Diabetes type I Sister   . Heart disease Maternal Uncle   . Uterine cancer Paternal Grandmother 25  . Breast cancer Other 84    paternal great grandmother  [name] died at the age of 38 from lung cancer.  He had been a remote smoker and had been in [employer] with a question of possible asbestos exposure.  [name] died at the age of 45 in the setting of Alzheimer's, coronary artery disease, diabetes and hypertension.  The patient had two sisters.  One died from complications of diabetes.  The other one is alive with diabetes.    GYNECOLOGIC HISTORY: She is GX P1.  First pregnancy to term at age 27.  The patient's last period was in 2008.  She took hormone replacement for about 18 months until mid-2008.  She had no complications from that treatment.    SOCIAL HISTORY: (Updated October 2014) She used to be a  Arts development officer for [employer]  They used to live in the [place] area. 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  Her husband, [name], retired October 2012.  He was a Arts development officer for 28 years.  Their son, [name], is currently with the [employer] at the [employer] in [place]. [name].  She attends [employer].   ADVANCED DIRECTIVES: in place  HEALTH MAINTENANCE: (Updated 11/19/2012) History  Substance Use Topics  . Smoking status: Former Smoker -- 1.00 packs/day for 11 years    Quit date: 01/23/1984  . Smokeless tobacco: Never Used  . Alcohol Use: 1.5 - 2.5 oz/week    3-5 drink(s) per week     Comment: glasses of wine     Colonoscopy: 2010  PAP: Jan 2014  Bone density: July 2014, osteopenia  Lipid panel: per Dr Valentina Lucks  Allergies  Allergen  Reactions  . Sulfa Antibiotics Shortness Of Breath  . Codeine Nausea And Vomiting    Tolerates oxycodone if given with prochlorperazine for nausea  . Morphine And Related Nausea And Vomiting    Per patient possibility that the IR morphine was causing the vomiting   Current outpatient prescriptions:ALPRAZolam (XANAX XR) 1 MG 24 hr tablet, Up to twice daily as needed for anxiety, Disp: 90 tablet, Rfl: 0;  Calcium Citrate (CITRACAL PO), Take 3 tablets by mouth daily., Disp: , Rfl: ;  cholecalciferol (VITAMIN D) 400 UNITS TABS tablet, Take 400 Units by mouth daily. Patient reportd the Vitamin E should be vitamin D with same dose, Disp: , Rfl:  cholestyramine (QUESTRAN) 4 G packet, Take 1 packet (4 g total) by mouth 2 (two) times daily as needed., Disp: 60 each, Rfl: 12;  diphenhydrAMINE (BENADRYL) 25 mg capsule, Take 25 mg by mouth at bedtime as needed for sleep. , Disp: , Rfl: ;  Docusate Calcium (STOOL SOFTENER PO), Take by mouth daily., Disp: , Rfl: ;  gabapentin (NEURONTIN) 300 MG capsule, Take 300 mg by mouth at bedtime. , Disp: , Rfl:  HYDROmorphone  (DILAUDID) 2 MG tablet, Take 1 tablet (2 mg total) by mouth every 4 (four) hours as needed for moderate pain or severe pain., Disp: 180 tablet, Rfl: 0;  ondansetron (ZOFRAN) 8 MG tablet, Take 1 tablet (8 mg total) by mouth 2 (two) times daily as needed for nausea or vomiting., Disp: 30 tablet, Rfl: 6;  Polyethyl Glycol-Propyl Glycol (SYSTANE) 0.4-0.3 % SOLN, Apply 1 drop to eye 2 (two) times daily as needed (dry eyes)., Disp: , Rfl:  Polyethylene Glycol 3350 (MIRALAX PO), Take by mouth daily., Disp: , Rfl: ;  prochlorperazine (COMPAZINE) 5 MG tablet, Take 5 mg by mouth 3 (three) times daily., Disp: , Rfl: ;  Vaginal Lubricant (REPLENS VA), Place vaginally. Combining with estroglide, Disp: , Rfl: ;  venlafaxine XR (EFFEXOR-XR) 75 MG 24 hr capsule, Take 2 capsules (150 mg total) by mouth daily with breakfast., Disp: 180 capsule, Rfl: 1 Zoledronic Acid (ZOMETA IV), Inject into the vein every 30 (thirty) days., Disp: , Rfl:   OBJECTIVE: Middle-aged white woman who moves with some stiffness because of pain  Filed Vitals:   09/10/13 1314  BP: 132/77  Pulse: 93  Temp: 98.5 F (36.9 C)  Resp: 20   Skin: warm, dry  HEENT: sclerae anicteric, conjunctivae pink, oropharynx clear. No thrush or mucositis.  Lymph Nodes: No cervical or supraclavicular lymphadenopathy  Lungs: clear to auscultation bilaterally, no rales, wheezes, or rhonci  Heart: regular rate and rhythm  Abdomen: round, soft, non tender, positive bowel sounds  Musculoskeletal: tenderness near rib 6/7 medially towards spine when pressure applied in this area, ROM not affected, no peripheral edema  Neuro: non focal, well oriented, positive affect  Breasts: deferred  LAB RESULTS:  Lab Results  Component Value Date   WBC 4.2 09/10/2013   NEUTROABS 2.4 09/10/2013   HGB 12.0 09/10/2013   HCT 37.0 09/10/2013   MCV 90.1 09/10/2013   PLT 217 09/10/2013      Chemistry      Component Value Date/Time   NA 139 09/10/2013 1229   NA 141 03/29/2013  0350   NA 142 06/26/2011 0728   K 4.7 09/10/2013 1229   K 3.7 03/29/2013 0350   K 4.6 06/26/2011 0728   CL 111 03/29/2013 0350   CL 105 01/07/2012 1347   CL 102 06/26/2011 0728   CO2 26 09/10/2013 1229  CO2 18* 03/29/2013 0350   CO2 30 06/26/2011 0728   BUN 11.3 09/10/2013 1229   BUN 10 03/29/2013 0350   BUN 12 06/26/2011 0728   CREATININE 1.1 09/10/2013 1229   CREATININE 0.72 03/29/2013 0350   CREATININE 1.1 06/26/2011 0728      Component Value Date/Time   CALCIUM 9.6 09/10/2013 1229   CALCIUM 7.8* 03/29/2013 0350   CALCIUM 9.1 06/26/2011 0728   ALKPHOS 151* 09/10/2013 1229   ALKPHOS 91 04/30/2013 1301   AST 41* 09/10/2013 1229   AST 67* 04/30/2013 1301   ALT 33 09/10/2013 1229   ALT 93* 04/30/2013 1301   BILITOT 0.29 09/10/2013 1229   BILITOT 0.4 04/30/2013 1301       Lab Results  Component Value Date   LABCA2 394* 08/28/2013    STUDIES: Nm Pet Image Restag (ps) Skull Base To Thigh  08/12/2013   CLINICAL DATA:  Subsequent treatment strategy for breast carcinoma. Skeletal metastasis.  EXAM: NUCLEAR MEDICINE PET SKULL BASE TO THIGH  TECHNIQUE: 10.3 mCi F-18 FDG was injected intravenously. Full-ring PET imaging was performed from the skull base to thigh after the radiotracer. CT data was obtained and used for attenuation correction and anatomic localization.  FASTING BLOOD GLUCOSE:  Value: 99 mg/dl  COMPARISON:  PET-CT scan 06/02/1998 and few from 03/23/2000, 02/02/2013  FINDINGS: NECK  No hypermetabolic lymph nodes in the neck.  CHEST  No hypermetabolic mediastinal or hilar nodes. No suspicious pulmonary nodules on the CT scan.  ABDOMEN/PELVIS  No abnormal hypermetabolic activity within the liver, pancreas, or spleen. No hypermetabolic lymph nodes in the abdomen or pelvis.  There is new mild metabolic activity associated with the right adrenal gland with SUV max equal 3 .6. No mass lesions identified.  SKELETON  Again demonstrated multiple foci of hypermetabolic activity throughout the axillary and appendicular  skeleton associated with mixed lytic and sclerotic lesions. The metabolic activity stable to slightly decreased. For example lesion at L2 with SUV max 9.8 compares to 9.8 on most prior. Lesion in the medial aspect of the right clavicle as SUV max 6.3 decrease in 7.4 on prior. Lesion in the right iliac bone and [place] max 5.3 compared to 7.9 on prior. No new lesions are identified.  IMPRESSION: 1. Stable to mild decrease in metabolic activity associated multiple skeletal least suggests a mild positive response. Of note on prior exam, the increased metabolic activity likely represented a mild progression of disease. 2. No evidence of new of skeletal metastasis. 3. No evidence of metastasis to the soft tissues in the chest, abdomen, or pelvis. 4. New mild uptake within the right adrenal gland without mass lesion. Recommend close attention on follow-up.   Electronically Signed   By: Suzy Bouchard M.D.   On: 08/12/2013 10:15    ASSESSMENT: 63 y.o. [place] woman   (1)  status post Left breast and Left axillary lymph node biopsy Dec 2010 for a cT3 pN1 (stage IIIA) invasive ductal carcinoma, grade 2, estrogen and progesterone receptor positive, HER-2 negative, with an MIB-1 of 44%;   (2)  treated neoadjuvantly with dose dense cyclophosphamide and doxorubicin x4 followed by weekly paclitaxel x7  (3) s/p bilateral mastectomies May 2011. She had a residual 1.5 cm tumor, and one of 8 sampled lymph nodes was positive [ypT1c ypN1]. Margins were negative.   (4)  She completed post-mastectomy radiation in August of 2011   (5) on letrozole between August 2011 and October 2014  (6) PET scan 11/05/2012 obtained to evaluate  right chest wall pain documents bony metastatic disease to the right seventh rib, L1, and the right acetabulum with an associated pathologic fracture through the anterior cortex of the acetabulum measuring 3.2 cm. There was no evidence of extra osseous metastatic disease.  (7) starting 11/05/2012  a received fulvestrant, 500 mg monthly, in addition to monthly infusions of zoledronic acid. The fulvestrant was discontinued May 2015 with evidence of progression  (8) Right rib, left hip and right hip were all treated to 37.5 Gy in 15 fractions at 2.5 Gy per fraction completed 12/17/2012  (9) 02/19/2013 bone marrow biopsy confirms metastatic disease to bone. HER-2 was not done because of concerns regarding because of fine the specimen. Estrogen and progesterone receptors were read as negative. This i was felt to be possibly discordant.  (10) patient's tumor carries a PIK3 mutation as documented at [place]  (11) L ribcage pain: s/p palliative XRT completed 06/16/2013  (12) declined participation in a capecitabine plus PIK 3 inhibitor at [facility]; started capecitabine alone 06/27/2013, discontinued after one week because of side effects; resumed 08/17/2013 at 500 mg twice a day one-week on one-week off  (13) radiation to Right sacrum/ lower lumbar spine 06/10 to 06/23    PLAN:  Brooke [name] did much better with cycle 2 of low-dose capecitabine. The labs were reviewed in detail with her and were stable. She is going to continue the anti-emetic plan for this upcoming cycle 3. We will increase the dose to 2 pills (1057m) in the morning and 1 pill (507mat night). Pat and her husband feel comfortable with this adjustment.   PaFraser Dins however concerned about her left rib pain, she says she could have just slept on that side wrong. She has had radiation to this area in May of this year. Her last PET scan on 7/22 showed no evidence of new skeletal metastasis and stable to mild decrease in metabolic activity, suggesting a mild positive response to the previous treatment. I asked her to monitor her pain level to this side and report back to usKoreabout it on her next visit. I refilled her hydromorphone prescription at this time.  She will begin cycle 3 on Monday and see usKoreaack here in the  office for labs and a follow up visit in 2 weeks. PaFraser Dinnd her husband understand and agree with the plan. They have been encouraged to call with any issues that may arise before her next visit here.    FeMarcelino DusterNP  09/10/2013

## 2013-09-11 ENCOUNTER — Encounter: Payer: Self-pay | Admitting: Nurse Practitioner

## 2013-09-17 ENCOUNTER — Telehealth: Payer: Self-pay | Admitting: *Deleted

## 2013-09-17 NOTE — Telephone Encounter (Signed)
This RN spoke to patient and patient's husband regarding pain and Dilaudid. Per Dr. Jana Hakim, the maximum she can have is three tablets every four hours. Patient and husband verbalized understanding.

## 2013-09-17 NOTE — Telephone Encounter (Signed)
Received call from patient stating," I'm having right sided pain. It goes from my armpit to my waist. I have Dilaudid 2 mg tablets. How many can I take? I'm up to 6 or 7 a day." Return number is [phone].

## 2013-09-21 ENCOUNTER — Other Ambulatory Visit: Payer: Self-pay | Admitting: Oncology

## 2013-09-21 DIAGNOSIS — C50919 Malignant neoplasm of unspecified site of unspecified female breast: Secondary | ICD-10-CM

## 2013-09-23 ENCOUNTER — Telehealth: Payer: Self-pay | Admitting: Oncology

## 2013-09-23 ENCOUNTER — Telehealth: Payer: Self-pay | Admitting: *Deleted

## 2013-09-23 NOTE — Telephone Encounter (Signed)
per pof to sch pt trmt-cld & spoke w/Phil pt spouse to adv of time & date-spouse understood

## 2013-09-23 NOTE — Telephone Encounter (Signed)
NOTED ON EPIC IMMUNIZATION SHEET.

## 2013-09-24 ENCOUNTER — Other Ambulatory Visit (HOSPITAL_BASED_OUTPATIENT_CLINIC_OR_DEPARTMENT_OTHER): Payer: Medicare Other

## 2013-09-24 ENCOUNTER — Telehealth: Payer: Self-pay | Admitting: Oncology

## 2013-09-24 ENCOUNTER — Ambulatory Visit (HOSPITAL_BASED_OUTPATIENT_CLINIC_OR_DEPARTMENT_OTHER): Payer: Medicare Other | Admitting: Oncology

## 2013-09-24 VITALS — BP 110/68 | HR 98 | Temp 98.3°F | Resp 18 | Ht 64.0 in | Wt 185.7 lb

## 2013-09-24 DIAGNOSIS — C50512 Malignant neoplasm of lower-outer quadrant of left female breast: Secondary | ICD-10-CM

## 2013-09-24 DIAGNOSIS — Z170 Estrogen receptor positive status [ER+]: Secondary | ICD-10-CM

## 2013-09-24 DIAGNOSIS — R0781 Pleurodynia: Secondary | ICD-10-CM

## 2013-09-24 DIAGNOSIS — M545 Low back pain, unspecified: Secondary | ICD-10-CM

## 2013-09-24 DIAGNOSIS — M25551 Pain in right hip: Secondary | ICD-10-CM

## 2013-09-24 DIAGNOSIS — C50919 Malignant neoplasm of unspecified site of unspecified female breast: Secondary | ICD-10-CM

## 2013-09-24 DIAGNOSIS — C7951 Secondary malignant neoplasm of bone: Secondary | ICD-10-CM

## 2013-09-24 DIAGNOSIS — M25559 Pain in unspecified hip: Secondary | ICD-10-CM

## 2013-09-24 DIAGNOSIS — M79603 Pain in arm, unspecified: Secondary | ICD-10-CM

## 2013-09-24 DIAGNOSIS — C773 Secondary and unspecified malignant neoplasm of axilla and upper limb lymph nodes: Secondary | ICD-10-CM

## 2013-09-24 DIAGNOSIS — C50519 Malignant neoplasm of lower-outer quadrant of unspecified female breast: Secondary | ICD-10-CM

## 2013-09-24 LAB — CBC WITH DIFFERENTIAL/PLATELET
BASO%: 0.7 % (ref 0.0–2.0)
Basophils Absolute: 0 10*3/uL (ref 0.0–0.1)
EOS ABS: 0.5 10*3/uL (ref 0.0–0.5)
EOS%: 9.3 % — ABNORMAL HIGH (ref 0.0–7.0)
HEMATOCRIT: 35.3 % (ref 34.8–46.6)
HEMOGLOBIN: 11.7 g/dL (ref 11.6–15.9)
LYMPH%: 18.6 % (ref 14.0–49.7)
MCH: 30 pg (ref 25.1–34.0)
MCHC: 33.2 g/dL (ref 31.5–36.0)
MCV: 90.2 fL (ref 79.5–101.0)
MONO#: 0.7 10*3/uL (ref 0.1–0.9)
MONO%: 13.5 % (ref 0.0–14.0)
NEUT%: 57.9 % (ref 38.4–76.8)
NEUTROS ABS: 2.9 10*3/uL (ref 1.5–6.5)
PLATELETS: 244 10*3/uL (ref 145–400)
RBC: 3.91 10*6/uL (ref 3.70–5.45)
RDW: 15.6 % — ABNORMAL HIGH (ref 11.2–14.5)
WBC: 4.9 10*3/uL (ref 3.9–10.3)
lymph#: 0.9 10*3/uL (ref 0.9–3.3)

## 2013-09-24 LAB — COMPREHENSIVE METABOLIC PANEL (CC13)
ALT: 23 U/L (ref 0–55)
AST: 32 U/L (ref 5–34)
Albumin: 3.4 g/dL — ABNORMAL LOW (ref 3.5–5.0)
Alkaline Phosphatase: 138 U/L (ref 40–150)
Anion Gap: 8 mEq/L (ref 3–11)
BUN: 13.3 mg/dL (ref 7.0–26.0)
CALCIUM: 9.5 mg/dL (ref 8.4–10.4)
CHLORIDE: 105 meq/L (ref 98–109)
CO2: 26 mEq/L (ref 22–29)
CREATININE: 1 mg/dL (ref 0.6–1.1)
Glucose: 86 mg/dl (ref 70–140)
Potassium: 4.3 mEq/L (ref 3.5–5.1)
SODIUM: 140 meq/L (ref 136–145)
Total Bilirubin: 0.26 mg/dL (ref 0.20–1.20)
Total Protein: 6.4 g/dL (ref 6.4–8.3)

## 2013-09-24 MED ORDER — FENTANYL 25 MCG/HR TD PT72: 25.0000 ug | MEDICATED_PATCH | TRANSDERMAL | Status: DC

## 2013-09-24 MED ORDER — HYDROMORPHONE HCL 2 MG PO TABS: 4.0000 mg | ORAL_TABLET | ORAL | Status: DC | PRN

## 2013-09-24 NOTE — Progress Notes (Signed)
ID: Brooke [name]   DOB: 1950/05/17  MR#: [id number]  GVM#:[id number]  PCP: Lillia Mountain, MD GYN: SU:  OTHER MD: Lurline Hare, Ollen Gross, [name] (FAX [phone]), Epifanio Lesches DDS  CHIEF COMPLAINT: stage IV breast cancer CURRENT THERAPY: zolendronate, low-dose capecitabine  BREAST CANCER HISTORY: From the January 2011 summary:  Brooke [name] had screening mammography in August 04, 2007 which showed no specific mammographic evidence of malignancy, but she reported a left breast lump.  Accordingly, she was brought back on August 11, 2007 for mammography and ultrasonography.  There was indeed an obscured mass in the upper left breast which by ultrasound appeared to be a simple cyst.    Screening mammogram on August 04, 2008 showed in addition to dense breasts, again a possible mass in the left breast.  She had diagnostic mammography August 06, 2008 and additional views in addition to very dense breasts did not show any persistent mass or distortion.  Furthermore, Dr. Manson Passey was not able to palpate any abnormality in the lateral portion of the left breast.  Ultrasound showed normal appearing fibroglandular tissue in the area in question.    More recently, about September, the patient felt again something like a lump in the left breast and she had some pain associated with this.  This was initially felt simply to be mastalgia and was treated as such, but as it persisted, on December 23rd, the patient had left diagnostic mammography and ultrasonography.  There was a focally dense area in the left breast with very minimal distortion corresponding to the area of palpable abnormality. The ultrasound showed an irregular ill-defined, hypoechoic area with distal shadowing, measuring approximately 1 cm corresponding to the patient's palpable abnormality.    With this information, the patient was brought back for ultrasound-guided core biopsy and this was performed December 28th. 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  There was no evidence of angiolymphatic invasion in the material submitted which measured 1.5 cm maximally.  In addition to the mass in the breast, a lymph node in the left axilla was biopsied and was likewise positive.  The tumor was ER positive at 89%, PR positive at 34% with an elevated MIB-1 at 44% and Her-2 negative with a ratio of 1.13.    Her subsequent history is as detailed below.  INTERVAL HISTORY: Brooke [name] returns for follow up of her metastatic breast cancer accompanied by her husband Brooke [name]. Today is day 11 cycle 3 of capecitabine which was dosed at 1000 mg po in AM and 500 mg po in PM, 7 days on, seven days off. This time, even though the dose was increased, they did much better with the ancillary symptoms. Specifically nausea was absolutely controlled using ondansetron beginning on day 4 and continuing through day 6, and then switching to prochlorperazine day 5 and continuing to date 8. Generally E. date take MiraLAX and stool softeners to control the constipation due to her narcotics, but they know to stop those as soon as possible he and of diarrhea develops. With the simple changes they feel they can tolerate yet another dose increase with the next cycle coming up next week  REVIEW OF SYSTEMS: Unfortunately her pain is worse. It centers on her lower thoracic and lumbar spine, and it can radiate down both legs. She denies leg weakness, and she walked between 6 and 8 miles in the last week. This test painful to move. She opted eyelids and she is taking  2 tablets every 4 hours, the maximum she has taken so far is 18 mg a day. Again, the pain in his lower back and right hip and upper leg. It is not constant. It is worse with activity. She is mildly short of breath and occasionally has a little bit of a dry cough. Her left big toe is not. There have been no unusual headaches, visual changes, dizziness, or gait imbalance.  A detailed review of systems today was otherwise stable  PAST MEDICAL HISTORY: Past Medical History  Diagnosis Date  . Hx Breast Cancer, IDC, Stahe III, receptor + 10/20/2010    left breast  . Breast cancer December 2010    invasive ductal and invasive lobular; bilateral mastectomy and radiation  . Mitral valve prolapse   . History of recurrent UTIs   . Radiation 07/26/09-09/08/09    left breast  . Fracture, pelvis closed 11/2012    both hipsand number 7 rib, 15 rounds radiation  1. Remote migraines, currently inactive.   2. History of prior diagnosis of fibromyalgia made at [facility] and currently not active (she was treated with trimethoprim and nortriptyline for one year for this).    3. History of recurrent UTIs.   4. History of benign left breast cyst aspirated in 2004 at [facility] under Dr. Melene Plan.   5. History of tonsillectomy and adenoidectomy.   6. History of right LASEK surgery. 7. History of bilateral reduction mammoplasties in 1994.   8. History of mitral valve prolapse diagnosed in 1984.  PAST SURGICAL HISTORY: Past Surgical History  Procedure Laterality Date  . Mastectomy Bilateral 2011    left breast cancer  . Lasik    . Reduction mammaplasty Bilateral 1994  . Tonsillectomy and adenoidectomy    . Aspiration of left breast Left 2004    [facility]/Dr.Teo  . Bone biopsy  02/19/13    mets from breast cancer-receptors results not back    FAMILY HISTORY Family History  Problem Relation Age of Onset  . Lung cancer Father 11    smoker for 25 years  . Heart disease Maternal Grandmother   . Heart disease Maternal Grandfather   . Diabetes type I Mother   . Diabetes type I Sister   . Diabetes type I Sister   . Heart disease Maternal Uncle   . Uterine cancer Paternal Grandmother 31  . Breast cancer Other 4    paternal great grandmother  [name] died at the age of 47 from lung cancer.  He had been a remote  smoker and had been in [employer] with a question of possible asbestos exposure.  [name] died at the age of 69 in the setting of Alzheimer's, coronary artery disease, diabetes and hypertension.  The patient had two sisters.  One died from complications of diabetes.  The other one is alive with diabetes.    GYNECOLOGIC HISTORY: She is GX P1.  First pregnancy to term at age 47.  The patient's last period was in 2008.  She took hormone replacement for about 18 months until mid-2008.  She had no complications from that treatment.    SOCIAL HISTORY: (Updated October 2014) She used to be a Arts development officer for [employer]  They used to live in the [place] area. 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  Her husband, Brooke [name], retired October 2012.  He was a Arts development officer for 28 years.  Their  son, Brooke [name], is currently with the [employer] at the [employer] in [place]. [name].  She attends [employer].   ADVANCED DIRECTIVES: in place  HEALTH MAINTENANCE: (Updated 11/19/2012) History  Substance Use Topics  . Smoking status: Former Smoker -- 1.00 packs/day for 11 years    Quit date: 01/23/1984  . Smokeless tobacco: Never Used  . Alcohol Use: 1.5 - 2.5 oz/week    3-5 drink(s) per week     Comment: glasses of wine     Colonoscopy: 2010  PAP: Jan 2014  Bone density: July 2014, osteopenia  Lipid panel: per Dr Valentina Lucks  Allergies  Allergen Reactions  . Sulfa Antibiotics Shortness Of Breath  . Codeine Nausea And Vomiting    Tolerates oxycodone if given with prochlorperazine for nausea  . Morphine And Related Nausea And Vomiting    Per patient possibility that the IR morphine was causing the vomiting   Current outpatient prescriptions:ALPRAZolam (XANAX XR) 1 MG 24 hr tablet, Up to twice daily as needed for anxiety, Disp: 90 tablet, Rfl: 0;  Calcium Citrate (CITRACAL PO), Take 3 tablets by mouth  daily., Disp: , Rfl: ;  cholecalciferol (VITAMIN D) 400 UNITS TABS tablet, Take 400 Units by mouth daily. Patient reportd the Vitamin E should be vitamin D with same dose, Disp: , Rfl:  cholestyramine (QUESTRAN) 4 G packet, Take 1 packet (4 g total) by mouth 2 (two) times daily as needed., Disp: 60 each, Rfl: 12;  diphenhydrAMINE (BENADRYL) 25 mg capsule, Take 25 mg by mouth at bedtime as needed for sleep. , Disp: , Rfl: ;  Docusate Calcium (STOOL SOFTENER PO), Take by mouth daily., Disp: , Rfl: ;  gabapentin (NEURONTIN) 300 MG capsule, TAKE 1 CAPSULE (300 MG TOTAL) BY MOUTH AT BEDTIME., Disp: 90 capsule, Rfl: 0 HYDROmorphone (DILAUDID) 2 MG tablet, Take 1 tablet (2 mg total) by mouth every 4 (four) hours as needed for moderate pain or severe pain., Disp: 180 tablet, Rfl: 0;  ondansetron (ZOFRAN) 8 MG tablet, Take 1 tablet (8 mg total) by mouth 2 (two) times daily as needed for nausea or vomiting., Disp: 30 tablet, Rfl: 6;  Polyethyl Glycol-Propyl Glycol (SYSTANE) 0.4-0.3 % SOLN, Apply 1 drop to eye 2 (two) times daily as needed (dry eyes)., Disp: , Rfl:  Polyethylene Glycol 3350 (MIRALAX PO), Take by mouth daily., Disp: , Rfl: ;  prochlorperazine (COMPAZINE) 5 MG tablet, Take 5 mg by mouth 3 (three) times daily., Disp: , Rfl: ;  Vaginal Lubricant (REPLENS VA), Place vaginally. Combining with estroglide, Disp: , Rfl: ;  venlafaxine XR (EFFEXOR-XR) 75 MG 24 hr capsule, Take 2 capsules (150 mg total) by mouth daily with breakfast., Disp: 180 capsule, Rfl: 1 Zoledronic Acid (ZOMETA IV), Inject into the vein every 30 (thirty) days., Disp: , Rfl:   OBJECTIVE: Middle-aged white woman who appears stated age  63 Vitals:   09/24/13 1435  BP: 110/68  Pulse: 98  Temp: 98.3 F (36.8 C)  Resp: 18   ECOG 2  Sclerae unicteric, pupils equal and reactive Oropharynx clear and moist No cervical or supraclavicular adenopathy Lungs no rales or rhonchi, fair excursion bilaterally Heart regular rate and  rhythm Abd soft, nontender, positive bowel sounds, no masses palpated MSK mild-to-moderate spinal tenderness over the upper lumbar spine., no upper extremity lymphedema; straight-leg raise bilaterally to about 80 Neuro: nonfocal and specifically dorsiflexion is 5 over 5 bilaterally, well oriented, appropriate affect Breasts: Deferred    LAB  RESULTS: Results for MAKAILEY, HODGKIN (MRN [id number]) as of 09/24/2013 15:58  Ref. Range 07/02/2013 11:28 07/27/2013 12:08 07/27/2013 12:12 08/17/2013 13:17 08/28/2013 08:53  CA 27.29 Latest Range: 0-39 U/mL   474 (H) 449 (H) 394 (H)  CEA Latest Range: 0.0-5.0 ng/mL 152.6 (H) 177.2 (H)  148.1 (H) 134.0 (H)    Lab Results  Component Value Date   WBC 4.9 09/24/2013   NEUTROABS 2.9 09/24/2013   HGB 11.7 09/24/2013   HCT 35.3 09/24/2013   MCV 90.2 09/24/2013   PLT 244 09/24/2013      Chemistry      Component Value Date/Time   NA 139 09/10/2013 1229   NA 141 03/29/2013 0350   NA 142 06/26/2011 0728   K 4.7 09/10/2013 1229   K 3.7 03/29/2013 0350   K 4.6 06/26/2011 0728   CL 111 03/29/2013 0350   CL 105 01/07/2012 1347   CL 102 06/26/2011 0728   CO2 26 09/10/2013 1229   CO2 18* 03/29/2013 0350   CO2 30 06/26/2011 0728   BUN 11.3 09/10/2013 1229   BUN 10 03/29/2013 0350   BUN 12 06/26/2011 0728   CREATININE 1.1 09/10/2013 1229   CREATININE 0.72 03/29/2013 0350   CREATININE 1.1 06/26/2011 0728      Component Value Date/Time   CALCIUM 9.6 09/10/2013 1229   CALCIUM 7.8* 03/29/2013 0350   CALCIUM 9.1 06/26/2011 0728   ALKPHOS 151* 09/10/2013 1229   ALKPHOS 91 04/30/2013 1301   AST 41* 09/10/2013 1229   AST 67* 04/30/2013 1301   ALT 33 09/10/2013 1229   ALT 93* 04/30/2013 1301   BILITOT 0.29 09/10/2013 1229   BILITOT 0.4 04/30/2013 1301       Lab Results  Component Value Date   LABCA2 394* 08/28/2013    STUDIES: No results found.  ASSESSMENT: 63 y.o. [place] woman   (1)  status post Left breast and Left axillary lymph node biopsy Dec 2010 for a cT3 pN1 (stage IIIA) invasive ductal  carcinoma, grade 2, estrogen and progesterone receptor positive, HER-2 negative, with an MIB-1 of 44%;   (2)  treated neoadjuvantly with dose dense cyclophosphamide and doxorubicin x4 followed by weekly paclitaxel x7  (3) s/p bilateral mastectomies May 2011. She had a residual 1.5 cm tumor, and one of 8 sampled lymph nodes was positive [ypT1c ypN1]. Margins were negative.   (4)  She completed post-mastectomy radiation in August of 2011   (5) on letrozole between August 2011 and October 2014  (6) PET scan 11/05/2012 obtained to evaluate right chest wall pain documents bony metastatic disease to the right seventh rib, L1, and the right acetabulum with an associated pathologic fracture through the anterior cortex of the acetabulum measuring 3.2 cm. There was no evidence of extra osseous metastatic disease.  (7) starting 11/05/2012 a received fulvestrant, 500 mg monthly, in addition to monthly infusions of zoledronic acid. The fulvestrant was discontinued May 2015 with evidence of progression  (8) Right rib, left hip and right hip were all treated to 37.5 Gy in 15 fractions at 2.5 Gy per fraction completed 12/17/2012  (9) 02/19/2013 bone marrow biopsy confirms metastatic disease to bone. HER-2 was not done because of concerns regarding because of fine the specimen. Estrogen and progesterone receptors were read as negative. This i was felt to be possibly discordant.  (10) patient's tumor carries a PIK3 mutation as documented at [facility]  (11) L ribcage pain: s/p palliative XRT completed 06/16/2013  (12) declined  participation in a capecitabine plus PIK 3 inhibitor at [facility]; started capecitabine alone 06/27/2013, discontinued after one week because of side effects;  (a) resumed 08/17/2013 at 500 mg twice a day one-week on one-week off  (b) dose being increased gradually, to receive 1000 mg BID with cycle starting 09/28/2013  (13) radiation to Right sacrum/ lower lumbar  spine 06/10 to 06/23    PLAN:  Brooke [name] is tolerating the capecitabine much better. I think we can go up to 2 tablets in the morning and 2 in the evening, and they will start that cycle on 09/28/2013. It is very encouraging that both his CEA and CA 27-29 keep trending down  She and Brooke [name] have become very good at managing the constipation with MiraLAX and stool softeners, and at stopping the MiraLAX and stool softeners at the first and of diarrhea.  She is also doing very well at controlling the nausea, with ondansetron twice daily days 4, 5 and 6 and then switching to prochlorperazine 4 times a day for days 5 through8.  The big problem is the pain. This is really not well controlled. She is taking up to 18 mg of Dilantin today, and her pain is still a 7 or 8.  We are adding fentanyl 25 mcg per hour patches. They will get the first 3 patches today and the rest they will get through the mail. I have asked him to call me 3 days from now and if her pain is still not well-controlled I we will add a second patch so she will be on 50 mcg per hour from that point. Of course she will continue to say that I wanted as needed, as before.  We need to look at her spine. There is absolutely no hintof cord compression on her exam today, but possibly we could proceed to kyphoplasty and control her pain better with fewer narcotics on board.  Brooke [name] and Brooke [name] have a good understanding of this plan. They know the goal of treatment in her case is control. They will call with any problems that may develop before next visit here, which will be day 1 of the next cycle of capecitabine.  Chauncey Cruel, MD  09/24/2013

## 2013-09-24 NOTE — Telephone Encounter (Signed)
, °

## 2013-09-25 ENCOUNTER — Other Ambulatory Visit: Payer: Self-pay | Admitting: *Deleted

## 2013-09-25 LAB — CEA: CEA: 90.4 ng/mL — ABNORMAL HIGH (ref 0.0–5.0)

## 2013-09-25 LAB — CANCER ANTIGEN 27.29: CA 27.29: 363 U/mL — ABNORMAL HIGH (ref 0–39)

## 2013-09-25 NOTE — Addendum Note (Signed)
Addended by: Laureen Abrahams on: 09/25/2013 05:49 PM   Modules accepted: Orders

## 2013-09-29 ENCOUNTER — Telehealth: Payer: Self-pay | Admitting: Oncology

## 2013-09-29 NOTE — Addendum Note (Signed)
Encounter addended by: Thea Silversmith, MD on: 09/29/2013  4:33 PM<BR>     Documentation filed: Notes Section

## 2013-09-29 NOTE — Addendum Note (Signed)
Encounter addended by: Thea Silversmith, MD on: 09/29/2013  4:32 PM<BR>     Documentation filed: Notes Section

## 2013-09-29 NOTE — Telephone Encounter (Signed)
s.w. pt husband adn advised on MRI location changed to Gi...pt ok and aware of d.t

## 2013-09-30 ENCOUNTER — Encounter (HOSPITAL_COMMUNITY): Payer: Self-pay

## 2013-09-30 ENCOUNTER — Other Ambulatory Visit: Payer: Self-pay

## 2013-09-30 DIAGNOSIS — C50919 Malignant neoplasm of unspecified site of unspecified female breast: Secondary | ICD-10-CM

## 2013-09-30 DIAGNOSIS — C7951 Secondary malignant neoplasm of bone: Principal | ICD-10-CM

## 2013-09-30 MED ORDER — FENTANYL 50 MCG/HR TD PT72: 50.0000 ug | MEDICATED_PATCH | TRANSDERMAL | Status: DC

## 2013-10-02 ENCOUNTER — Telehealth: Payer: Self-pay | Admitting: *Deleted

## 2013-10-04 ENCOUNTER — Other Ambulatory Visit: Payer: Self-pay | Admitting: Oncology

## 2013-10-05 ENCOUNTER — Telehealth: Payer: Self-pay | Admitting: Obstetrics & Gynecology

## 2013-10-05 NOTE — Telephone Encounter (Signed)
pts spouse called back and rescheduled the appt there is a release to speak with him

## 2013-10-05 NOTE — Telephone Encounter (Signed)
LMTCB to reschedule 6 month pap with Dr. Sabra Heck from 10/22/13 to another day.

## 2013-10-06 ENCOUNTER — Ambulatory Visit (HOSPITAL_COMMUNITY): Payer: Medicare Other

## 2013-10-06 ENCOUNTER — Other Ambulatory Visit: Payer: Medicare Other

## 2013-10-07 NOTE — Progress Notes (Signed)
Histology and Location of Primary Cancer:Metastatic breast cance  Location(s) of Symptomatic Metastases:Multiple skeletal mets.  Past/Anticipated chemotherapy by medical oncology, if any: Current treatment of capecitabine. Past treatment of cyclophosphamide and doxorubicin followed by paclitaxel. Letrozole August  2011 to October  2014 Fulvestrant monthly started 11/05/12, stopped May 2015. Zoledronic acid   Pain on a scale of 0-10 is: 8 to 10. Currently on fentanyl patch 25 mcq and may take 8 mg dilaudid every 4 hours as needed.Took 22 mg dilaudid on 10/07/13. Pain on inspiration.   If Spine Met(s), symptoms, if any, include:multiple pain sites greatest in right mid rib-cage radiating around to back, bilateral thoracic cage and left upper lateral thigh.  Bowel/Bladder retention or incontinence (please describe):on stool softe  Current Decadron regimen, if applicable:No  Ambulatory status? Walker? Wheelchair?: Ambulating without assistance but is guarded.  SAFETY ISSUES: Prior radiation?Right Sacrum 07/01/13-07/14/13  /right chest wall 60.4 Gy completed 08/2009/Bilateral hip 12/17/12/Left ribs 06/22/13.                                                                                                                                                                                                  Pacemaker/ICD? No  Possible current pregnancy? No  Is the patient on methotrexate?No  Current Complaints / other details: Scheduled for mri on 10/09/13.

## 2013-10-08 ENCOUNTER — Ambulatory Visit
Admission: RE | Admit: 2013-10-08 | Discharge: 2013-10-08 | Disposition: A | Discharge status: Home / Self Care | Payer: Medicare Other | Source: Ambulatory Visit | Attending: Radiation Oncology | Admitting: Radiation Oncology

## 2013-10-08 ENCOUNTER — Encounter: Payer: Self-pay | Admitting: Emergency Medicine

## 2013-10-08 VITALS — BP 114/79 | HR 89 | Temp 98.7°F | Resp 18 | Wt 187.4 lb

## 2013-10-08 DIAGNOSIS — C7951 Secondary malignant neoplasm of bone: Secondary | ICD-10-CM | POA: Insufficient documentation

## 2013-10-08 DIAGNOSIS — Z510 Encounter for antineoplastic radiation therapy: Secondary | ICD-10-CM | POA: Diagnosis present

## 2013-10-08 DIAGNOSIS — C50919 Malignant neoplasm of unspecified site of unspecified female breast: Secondary | ICD-10-CM | POA: Insufficient documentation

## 2013-10-08 DIAGNOSIS — C50911 Malignant neoplasm of unspecified site of right female breast: Secondary | ICD-10-CM

## 2013-10-08 DIAGNOSIS — C7952 Secondary malignant neoplasm of bone marrow: Secondary | ICD-10-CM

## 2013-10-08 NOTE — Progress Notes (Signed)
Name: Brooke [name]   MRN: [id number]  Date:  10/08/2013  DOB: 1950/12/27  Status:outpatient    DIAGNOSIS: Metastatic breast cancer to right ribs, left femur and spine.  CONSENT VERIFIED: yes   SET UP: Patient is setup supine   IMMOBILIZATION:  The following immobilization was used: Alpha cradle  NARRATIVE:  Pt [name] was brought to the CT Simulation planning suite.  Identity was confirmed.  All relevant records and images related to the planned course of therapy were reviewed.  Then, the patient was positioned in a stable reproducible clinical set-up for radiation therapy.  A wire was placed over the painful area in her ribs. bbs were placed on her previous tattoos. CT images were obtained.  An isocenter was placed first in the ribs, next the spine and then after another scan, the femur. Skin markings were placed.  The CT images were loaded into the planning software where the target and avoidance structures were contoured.  The radiation prescription was entered and confirmed. The patient was discharged in stable condition and tolerated simulation well.    TREATMENT PLANNING NOTE:  Treatment planning then occurred. I have requested : MLC's, isodose plan, basic dose calculation  I have requested 3 dimensional simulation with DVH of cord, kidneys, bowel and GTV.  Special treatment procedure was performed with previous treatment area to the ribs.

## 2013-10-08 NOTE — Progress Notes (Signed)
Please see the Nurse Progress Note in the MD Initial Consult Encounter for this patient. 

## 2013-10-08 NOTE — Addendum Note (Signed)
Encounter addended by: Thea Silversmith, MD on: 10/08/2013 10:05 AM<BR>     Documentation filed: Notes Section

## 2013-10-08 NOTE — Progress Notes (Signed)
Patient and spouse stopped by the lobby to have a message relayed to Dr Jana Hakim that Brooke [name] will begin the Capecitabine 3 tablets in the am and 3 tablets in the pm on Monday 9/21. The week of 09/14 was her week off per her spouse. The week prior she was taking 2 tablets BID and tolerated that well. Patient will see Susanne Borders NP on 9/21 as scheduled.

## 2013-10-08 NOTE — Progress Notes (Signed)
Department of Radiation Oncology  Phone:  [phone] Fax:        [phone]   Name: Brooke [name] MRN: [id number]  DOB: September 26, 1950  Date: 10/08/2013  Follow Up Visit Note  Diagnosis: Metastatic (Stage IV) breast cancer   Summary and Interval since last radiation:  60.4 Gy to the left chest wall completed 08/2009.  37.5 gy in 15 fractions to the bilateral hips and right ribs completed 12/17/12.  30 Gy in 10 fractions to the left ribs (retreat over prior chest wall tx) completed 06/22/13 30 gy in 10 fractions to the right sacrum completed 07/14/13  Interval History: Brooke [name] presents today for followup. She got better for a few months after radiation and was walking 6 miles a week. Over the past few weeks her pain in there left femur and right rib has worsened to the point where she is on 25 mcg of fentanyl and 22 mg of Dilaudid per day and still not able to leave the house without pain. She describes point tenderness over her left femur (worse with weight bearing) and right anterior rib. There eis also pain when she squeezes her elbows to her ribs. She is frightened to sneeze or take a deep breath due to excruciating pain. She is tolerating the xeloda better now and her recent PET scan and tumor markers are better. MRI of the spine is scheduled for tomorrow. She denies bowel or bladder problems.   Allergies:  Allergies  Allergen Reactions  . Sulfa Antibiotics Shortness Of Breath  . Codeine Nausea And Vomiting    Tolerates oxycodone if given with prochlorperazine for nausea  . Morphine And Related Nausea And Vomiting    Per patient possibility that the IR morphine was causing the vomiting    Medications:  Current Outpatient Prescriptions  Medication Sig Dispense Refill  . AFLURIA PRESERVATIVE FREE 0.5 ML SUSY       . ALPRAZolam (XANAX XR) 1 MG 24 hr tablet Up to twice daily as needed for anxiety  90 tablet  0  . Calcium Citrate (CITRACAL PO) Take 3 tablets by mouth daily.       . capecitabine (XELODA) 500 MG tablet       . cholecalciferol (VITAMIN D) 400 UNITS TABS tablet Take 400 Units by mouth daily. Patient reportd the Vitamin E should be vitamin D with same dose      . diphenhydrAMINE (BENADRYL) 25 mg capsule Take 25 mg by mouth at bedtime as needed for sleep.       [name] Calcium (STOOL SOFTENER PO) Take by mouth daily.      . fentaNYL (DURAGESIC - DOSED MCG/HR) 25 MCG/HR patch Place 1 patch (25 mcg total) onto the skin every 3 (three) days.  30 patch  0  . gabapentin (NEURONTIN) 300 MG capsule TAKE 1 CAPSULE (300 MG TOTAL) BY MOUTH AT BEDTIME.  90 capsule  0  . HYDROmorphone (DILAUDID) 2 MG tablet Take 8 mg by mouth every 4 (four) hours as needed for moderate pain or severe pain.      [name] Kitchen ondansetron (ZOFRAN) 8 MG tablet Take 1 tablet (8 mg total) by mouth 2 (two) times daily as needed for nausea or vomiting.  30 tablet  6  . Polyethyl Glycol-Propyl Glycol (SYSTANE) 0.4-0.3 % SOLN Apply 1 drop to eye 2 (two) times daily as needed (dry eyes).      . Polyethylene Glycol 3350 (MIRALAX PO) Take by mouth daily.      . prochlorperazine (  COMPAZINE) 5 MG tablet Take 5 mg by mouth 3 (three) times daily.      . Vaginal Lubricant (REPLENS VA) Place vaginally. Combining with estroglide      . venlafaxine XR (EFFEXOR-XR) 75 MG 24 hr capsule Take 2 capsules (150 mg total) by mouth daily with breakfast.  180 capsule  1  . Zoledronic Acid (ZOMETA IV) Inject into the vein.       . cholestyramine (QUESTRAN) 4 G packet Take 1 packet (4 g total) by mouth 2 (two) times daily as needed.  60 each  12  . fentaNYL (DURAGESIC - DOSED MCG/HR) 50 MCG/HR Place 1 patch (50 mcg total) onto the skin every 3 (three) days.  10 patch  0   No current facility-administered medications for this encounter.    Physical Exam:  Filed Vitals:   10/08/13 0902  BP: 114/79  Pulse: 89  Temp: 98.7 F (37.1 C)  Resp: 18  Weight: 187 lb 6.4 oz (85.004 kg)  Tenderness to palpation at the right  anterior rib below the scar. tenderness to palpation over the left femur. No tenderness to palpation of the spine midline. Alert and oriented.   IMPRESSION: Brooke [name] is a 63 y.o. female with new right rib pain and left femur pain.   PLAN:  I spoke to Brooke [name] and her husband. I've gone over her PET scan and her most worrisome disease is actually in the spine but this is not the pain she describes.  We will retreat her right ribs and her left femur.  I will also simulate her for treatment to her lower thoracic lumbar spine based on her description of the pain in her lower ribs. We will wait on the MRI results from tomorrow to finalize the volumes there. We also discussed referral to pain clinic as clearly just escalating narcotics with her is not providing good quality of life for her. She and Brooke [name] are amenable to this and will discuss with Dr. Jana Hakim on Monday.  We discussed skin redness and rib fractures. We discussed 10 treatments as an outpatient. She signed informed consent.    Brooke Silversmith, MD

## 2013-10-09 ENCOUNTER — Ambulatory Visit
Admission: RE | Admit: 2013-10-09 | Discharge: 2013-10-09 | Disposition: A | Discharge status: Home / Self Care | Payer: Medicare Other | Source: Ambulatory Visit | Attending: Oncology | Admitting: Oncology

## 2013-10-09 DIAGNOSIS — C50919 Malignant neoplasm of unspecified site of unspecified female breast: Secondary | ICD-10-CM

## 2013-10-09 DIAGNOSIS — C50512 Malignant neoplasm of lower-outer quadrant of left female breast: Secondary | ICD-10-CM

## 2013-10-09 DIAGNOSIS — C7951 Secondary malignant neoplasm of bone: Secondary | ICD-10-CM

## 2013-10-09 DIAGNOSIS — R0781 Pleurodynia: Secondary | ICD-10-CM

## 2013-10-09 DIAGNOSIS — M79603 Pain in arm, unspecified: Secondary | ICD-10-CM

## 2013-10-09 MED ORDER — GADOBENATE DIMEGLUMINE 529 MG/ML IV SOLN
17.0000 mL | Freq: Once | INTRAVENOUS | Status: AC | PRN
  Administered 2013-10-09: 17 mL via INTRAVENOUS

## 2013-10-12 ENCOUNTER — Other Ambulatory Visit: Payer: Self-pay | Admitting: Emergency Medicine

## 2013-10-12 ENCOUNTER — Encounter: Payer: Self-pay | Admitting: Nurse Practitioner

## 2013-10-12 ENCOUNTER — Ambulatory Visit (HOSPITAL_BASED_OUTPATIENT_CLINIC_OR_DEPARTMENT_OTHER): Payer: Medicare Other | Admitting: Nurse Practitioner

## 2013-10-12 ENCOUNTER — Telehealth: Payer: Self-pay | Admitting: Nurse Practitioner

## 2013-10-12 ENCOUNTER — Other Ambulatory Visit (HOSPITAL_BASED_OUTPATIENT_CLINIC_OR_DEPARTMENT_OTHER): Payer: Medicare Other

## 2013-10-12 VITALS — BP 122/65 | HR 89 | Temp 98.7°F | Resp 18 | Ht 64.0 in | Wt 183.6 lb

## 2013-10-12 DIAGNOSIS — R0781 Pleurodynia: Secondary | ICD-10-CM

## 2013-10-12 DIAGNOSIS — C7951 Secondary malignant neoplasm of bone: Principal | ICD-10-CM

## 2013-10-12 DIAGNOSIS — C50919 Malignant neoplasm of unspecified site of unspecified female breast: Secondary | ICD-10-CM

## 2013-10-12 DIAGNOSIS — M79602 Pain in left arm: Secondary | ICD-10-CM

## 2013-10-12 DIAGNOSIS — G893 Neoplasm related pain (acute) (chronic): Secondary | ICD-10-CM

## 2013-10-12 DIAGNOSIS — C7952 Secondary malignant neoplasm of bone marrow: Secondary | ICD-10-CM

## 2013-10-12 LAB — CBC WITH DIFFERENTIAL/PLATELET
BASO%: 0.5 % (ref 0.0–2.0)
BASOS ABS: 0 10*3/uL (ref 0.0–0.1)
EOS ABS: 0.3 10*3/uL (ref 0.0–0.5)
EOS%: 7.7 % — ABNORMAL HIGH (ref 0.0–7.0)
HEMATOCRIT: 35.5 % (ref 34.8–46.6)
HEMOGLOBIN: 11.6 g/dL (ref 11.6–15.9)
LYMPH%: 17 % (ref 14.0–49.7)
MCH: 29.8 pg (ref 25.1–34.0)
MCHC: 32.6 g/dL (ref 31.5–36.0)
MCV: 91.6 fL (ref 79.5–101.0)
MONO#: 0.6 10*3/uL (ref 0.1–0.9)
MONO%: 13.3 % (ref 0.0–14.0)
NEUT%: 61.5 % (ref 38.4–76.8)
NEUTROS ABS: 2.8 10*3/uL (ref 1.5–6.5)
PLATELETS: 258 10*3/uL (ref 145–400)
RBC: 3.87 10*6/uL (ref 3.70–5.45)
RDW: 15.3 % — ABNORMAL HIGH (ref 11.2–14.5)
WBC: 4.5 10*3/uL (ref 3.9–10.3)
lymph#: 0.8 10*3/uL — ABNORMAL LOW (ref 0.9–3.3)

## 2013-10-12 LAB — COMPREHENSIVE METABOLIC PANEL (CC13)
ALT: 22 U/L (ref 0–55)
ANION GAP: 9 meq/L (ref 3–11)
AST: 34 U/L (ref 5–34)
Albumin: 3.3 g/dL — ABNORMAL LOW (ref 3.5–5.0)
Alkaline Phosphatase: 139 U/L (ref 40–150)
BILIRUBIN TOTAL: 0.28 mg/dL (ref 0.20–1.20)
BUN: 12.5 mg/dL (ref 7.0–26.0)
CHLORIDE: 103 meq/L (ref 98–109)
CO2: 26 meq/L (ref 22–29)
CREATININE: 1.1 mg/dL (ref 0.6–1.1)
Calcium: 9.8 mg/dL (ref 8.4–10.4)
Glucose: 85 mg/dl (ref 70–140)
Potassium: 4.6 mEq/L (ref 3.5–5.1)
SODIUM: 138 meq/L (ref 136–145)
Total Protein: 6.6 g/dL (ref 6.4–8.3)

## 2013-10-12 MED ORDER — FENTANYL 25 MCG/HR TD PT72: 25.0000 ug | MEDICATED_PATCH | TRANSDERMAL | Status: DC

## 2013-10-12 NOTE — Progress Notes (Addendum)
ID: Brooke [name]   DOB: Jan 07, 1951  MR#: [id number]  CXK#:[id number]  PCP: Brooke Shelling, MD GYN: SU:  OTHER MD: Brooke [name], Brooke [name], Brooke [name] [phone]), Brooke [name] DDS  CHIEF COMPLAINT: stage IV breast cancer CURRENT THERAPY: zolendronate, low-dose capecitabine  BREAST CANCER HISTORY: From the January 2011 summary:  Brooke [name] had screening mammography in August 04, 2007 which showed no specific mammographic evidence of malignancy, but she reported a left breast lump.  Accordingly, she was brought back on August 11, 2007 for mammography and ultrasonography.  There was indeed an obscured mass in the upper left breast which by ultrasound appeared to be a simple cyst.    Screening mammogram on August 04, 2008 showed in addition to dense breasts, again a possible mass in the left breast.  She had diagnostic mammography August 06, 2008 and additional views in addition to very dense breasts did not show any persistent mass or distortion.  Furthermore, Brooke [name] was not able to palpate any abnormality in the lateral portion of the left breast.  Ultrasound showed normal appearing fibroglandular tissue in the area in question.    More recently, about September, the patient felt again something like a lump in the left breast and she had some pain associated with this.  This was initially felt simply to be mastalgia and was treated as such, but as it persisted, on December 23rd, the patient had left diagnostic mammography and ultrasonography.  There was a focally dense area in the left breast with very minimal distortion corresponding to the area of palpable abnormality. The ultrasound showed an irregular ill-defined, hypoechoic area with distal shadowing, measuring approximately 1 cm corresponding to the patient's palpable abnormality.    With this information, the patient was brought back for ultrasound-guided core biopsy and this was performed December 28th. 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  There was no evidence of angiolymphatic invasion in the material submitted which measured 1.5 cm maximally.  In addition to the mass in the breast, a lymph node in the left axilla was biopsied and was likewise positive.  The tumor was ER positive at 89%, PR positive at 34% with an elevated MIB-1 at 44% and Her-2 negative with a ratio of 1.13.    Her subsequent history is as detailed below.  INTERVAL HISTORY: Brooke [name] returns for follow up of her metastatic breast cancer accompanied by her husband, Brooke [name]. Today is 1 day, cycle 5 of capecitabine. During cycle 5, we increased her dose to 1500 mg BID, 7 days on, 7 days off. This cycle went fine. She takes ondansetron from days 4-6, and prochlorperazine days 5-9 for her nausea and this works well. She continues to take around 44m of dilaudid daily, coupled with 274mfentanyl patches. While siitting still, her pain is 4/10 to her thoracic and lumbar spine, right chest, and left leg, but this jumps to as high as 7/10 when asked to move. She is not ambulating with an assistive device, but mostly needs 1 hand assistance with arising from a seated position. Constipation is not an issue for her, she takes miralax daily with stool softeners. She is to begin palliative radiation this Wednesday for a total of 10 days.   REVIEW OF SYSTEMS: Despite the pain Brooke [name] walk around both levels of the mall. She is mildly short of breath with exertion with an occasional dry cough. She denies cardiac chest pain, headaches, visual changes, or dizziness.  A detailed review of systems was otherwise noncontributory.   PAST MEDICAL HISTORY: Past Medical History  Diagnosis Date  . Hx Breast Cancer, IDC, Stahe III, receptor + 10/20/2010    left breast  . Breast cancer December 2010    invasive ductal and invasive lobular; bilateral mastectomy and radiation  . Mitral valve prolapse   . History of  recurrent UTIs   . Radiation 07/26/09-09/08/09    left breast  . Fracture, pelvis closed 11/2012    both hipsand number 7 rib, 15 rounds radiation  1. Remote migraines, currently inactive.   2. History of prior diagnosis of fibromyalgia made at [facility] and currently not active (she was treated with trimethoprim and nortriptyline for one year for this).    3. History of recurrent UTIs.   4. History of benign left breast cyst aspirated in 2004 at [facility] under Dr. Melene [name].   5. History of tonsillectomy and adenoidectomy.   6. History of right LASEK surgery. 7. History of bilateral reduction mammoplasties in 1994.   8. History of mitral valve prolapse diagnosed in 1984.  PAST SURGICAL HISTORY: Past Surgical History  Procedure Laterality Date  . Mastectomy Bilateral 2011    left breast cancer  . Lasik    . Reduction mammaplasty Bilateral 1994  . Tonsillectomy and adenoidectomy    . Aspiration of left breast Left 2004    [facility]  . Bone biopsy  02/19/13    mets from breast cancer-receptors results not back    FAMILY HISTORY Family History  Problem Relation Age of Onset  . Lung cancer Father 71    smoker for 25 years  . Heart disease Maternal Grandmother   . Heart disease Maternal Grandfather   . Diabetes type I Mother   . Diabetes type I Sister   . Diabetes type I Sister   . Heart disease Maternal Uncle   . Uterine cancer Paternal Grandmother 51  . Breast cancer Other 97    paternal great grandmother  [name] died at the age of 81 from lung cancer.  He had been a remote smoker and had been in [employer] with a question of possible asbestos exposure.  [name] died at the age of 85 in the setting of Alzheimer's, coronary artery disease, diabetes and hypertension.  The patient had two sisters.  One died from complications of diabetes.  The other one is alive with diabetes.    GYNECOLOGIC  HISTORY: She is GX P1.  First pregnancy to term at age 50.  The patient's last period was in 2008.  She took hormone replacement for about 18 months until mid-2008.  She had no complications from that treatment.    SOCIAL HISTORY: (Updated October 2014) She used to be a Arts development officer for [employer]  They used to live in the [place] area. 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  Her husband, Brooke [name], retired October 2012.  He was a Arts development officer for 28 years.  Their son, [name], is currently with the [employer] at the [employer] in [place]. [name].  She attends [employer].   ADVANCED DIRECTIVES: in place  HEALTH MAINTENANCE: (Updated 11/19/2012) History  Substance Use Topics  . Smoking status: Former Smoker -- 1.00 packs/day for 11 years    Quit date: 01/23/1984  . Smokeless tobacco: Never Used  . Alcohol Use: 1.5 -  2.5 oz/week    3-5 drink(s) per week     Comment: glasses of wine     Colonoscopy: 2010  PAP: Jan 2014  Bone density: July 2014, osteopenia  Lipid panel: per Dr Valentina Lucks  Allergies  Allergen Reactions  . Sulfa Antibiotics Shortness Of Breath  . Codeine Nausea And Vomiting    Tolerates oxycodone if given with prochlorperazine for nausea  . Morphine And Related Nausea And Vomiting    Per patient possibility that the IR morphine was causing the vomiting   Current outpatient prescriptions:ALPRAZolam (XANAX XR) 1 MG 24 hr tablet, Up to twice daily as needed for anxiety, Disp: 90 tablet, Rfl: 0;  Calcium Citrate (CITRACAL PO), Take 3 tablets by mouth daily., Disp: , Rfl: ;  capecitabine (XELODA) 500 MG tablet, Pt takes 3 in the morning and 3 in the night, Disp: , Rfl:  cholecalciferol (VITAMIN D) 400 UNITS TABS tablet, Take 400 Units by mouth daily. Patient reportd the Vitamin E should be vitamin D with same dose, Disp: , Rfl: ;  diphenhydrAMINE (BENADRYL) 25 mg capsule, Take 25 mg  by mouth at bedtime as needed for sleep. , Disp: , Rfl: ;  Docusate Calcium (STOOL SOFTENER PO), Take by mouth daily., Disp: , Rfl:  fentaNYL (DURAGESIC - DOSED MCG/HR) 25 MCG/HR patch, Place 1 patch (25 mcg total) onto the skin every 3 (three) days., Disp: 10 patch, Rfl: 0;  gabapentin (NEURONTIN) 300 MG capsule, TAKE 1 CAPSULE (300 MG TOTAL) BY MOUTH AT BEDTIME., Disp: 90 capsule, Rfl: 0;  HYDROmorphone (DILAUDID) 2 MG tablet, Take 8 mg by mouth every 4 (four) hours as needed for moderate pain or severe pain., Disp: , Rfl:  ondansetron (ZOFRAN) 8 MG tablet, Take 1 tablet (8 mg total) by mouth 2 (two) times daily as needed for nausea or vomiting., Disp: 30 tablet, Rfl: 6;  Polyethyl Glycol-Propyl Glycol (SYSTANE) 0.4-0.3 % SOLN, Apply 1 drop to eye 2 (two) times daily as needed (dry eyes)., Disp: , Rfl: ;  Polyethylene Glycol 3350 (MIRALAX PO), Take by mouth daily., Disp: , Rfl:  prochlorperazine (COMPAZINE) 5 MG tablet, Take 5 mg by mouth 3 (three) times daily., Disp: , Rfl: ;  Vaginal Lubricant (REPLENS VA), Place vaginally. Combining with estroglide, Disp: , Rfl: ;  venlafaxine XR (EFFEXOR-XR) 75 MG 24 hr capsule, Take 2 capsules (150 mg total) by mouth daily with breakfast., Disp: 180 capsule, Rfl: 1;  Zoledronic Acid (ZOMETA IV), Inject into the vein. , Disp: , Rfl:  AFLURIA PRESERVATIVE FREE 0.5 ML SUSY, , Disp: , Rfl: ;  cholestyramine (QUESTRAN) 4 G packet, Take 1 packet (4 g total) by mouth 2 (two) times daily as needed., Disp: 60 each, Rfl: 12  OBJECTIVE: Middle-aged white woman who appears stated age  77 Vitals:   10/12/13 1444  BP: 122/65  Pulse: 89  Temp: 98.7 F (37.1 C)  Resp: 18   ECOG 2  .Skin: warm, dry  HEENT: sclerae anicteric, conjunctivae pink, oropharynx clear. No thrush or mucositis.  Lymph Nodes: No cervical or supraclavicular lymphadenopathy  Lungs: clear to auscultation bilaterally, no rales, wheezes, or rhonci  Heart: regular rate and rhythm  Abdomen: round,  soft, non tender, positive bowel sounds  Musculoskeletal: mild tenderness to lumbar spine, no peripheral edema  Neuro: non focal, well oriented, positive affect  Breasts: deferred    LAB RESULTS: Results for MARILENA, TREVATHAN (MRN [id number]) as of 09/24/2013 15:58  Ref. Range 07/02/2013 11:28 07/27/2013 12:08 07/27/2013 12:12 08/17/2013 13:17 08/28/2013  08:53  CA 27.29 Latest Range: 0-39 U/mL   474 (H) 449 (H) 394 (H)  CEA Latest Range: 0.0-5.0 ng/mL 152.6 (H) 177.2 (H)  148.1 (H) 134.0 (H)    Lab Results  Component Value Date   WBC 4.5 10/12/2013   NEUTROABS 2.8 10/12/2013   HGB 11.6 10/12/2013   HCT 35.5 10/12/2013   MCV 91.6 10/12/2013   PLT 258 10/12/2013      Chemistry      Component Value Date/Time   NA 138 10/12/2013 1329   NA 141 03/29/2013 0350   NA 142 06/26/2011 0728   K 4.6 10/12/2013 1329   K 3.7 03/29/2013 0350   K 4.6 06/26/2011 0728   CL 111 03/29/2013 0350   CL 105 01/07/2012 1347   CL 102 06/26/2011 0728   CO2 26 10/12/2013 1329   CO2 18* 03/29/2013 0350   CO2 30 06/26/2011 0728   BUN 12.5 10/12/2013 1329   BUN 10 03/29/2013 0350   BUN 12 06/26/2011 0728   CREATININE 1.1 10/12/2013 1329   CREATININE 0.72 03/29/2013 0350   CREATININE 1.1 06/26/2011 0728      Component Value Date/Time   CALCIUM 9.8 10/12/2013 1329   CALCIUM 7.8* 03/29/2013 0350   CALCIUM 9.1 06/26/2011 0728   ALKPHOS 139 10/12/2013 1329   ALKPHOS 91 04/30/2013 1301   AST 34 10/12/2013 1329   AST 67* 04/30/2013 1301   ALT 22 10/12/2013 1329   ALT 93* 04/30/2013 1301   BILITOT 0.28 10/12/2013 1329   BILITOT 0.4 04/30/2013 1301       Lab Results  Component Value Date   LABCA2 363* 09/24/2013    STUDIES: Mr Thoracic Spine W Wo Contrast  10/09/2013   CLINICAL DATA:  Back pain. Reduced range of motion. Metastatic breast cancer to bone.  EXAM: MRI THORACIC SPINE WITHOUT AND WITH CONTRAST  TECHNIQUE: Multiplanar and multiecho pulse sequences of the thoracic spine were obtained without and with intravenous contrast.  CONTRAST:  17 cc  MultiHance  COMPARISON:  Nuclear medicine PET-CT 08/12/2013.  FINDINGS: Scout images through the cervical spine demonstrate spondylosis and degenerative disc disease probably causing cervical impingement. This could be characterized with dedicated cervical spine MRI, if clinically warranted.  Bilateral small to moderate pleural effusions.  Diffuse metastatic disease in the thoracic spine with heterogeneous signal intensity. Chronic appearing mild superior endplate compression fractures at T10, T11, and T12.  1.1 x 0.4 cm focus of epidural tumor along the right anterior thecal sac at T10. Anterior epidural tumor noted extending along the vertebral body and pedicles at T3, relatively mild. Posterior contour of the T12 vertebral body likely due to underlying tumor.  No significant abnormal spinal cord signal is observed. No vertebral subluxation is observed. Additional findings at individual levels are as follows:  T1-2:  No impingement.  Mild right facet overgrowth.  T2-3: Mild right and borderline left foraminal stenosis due to facet arthropathy.  T3-4: Moderate right and mild left foraminal stenosis due to facet arthropathy.  T4-5: Borderline central narrowing of the thecal sac due to central disc protrusion.  T5-6:  No impingement.  Shallow left lateral recess disc protrusion.  C6-7:  No impingement.  Mild disc bulge.  T7-8:  No impingement.  Diffuse disc bulge.  T8-9: Borderline bilateral foraminal stenosis due to facet arthropathy and disc bulge.  T9-10:  No impingement.  Left facet arthropathy.  T10-11: Right anterior epidural tumor at T10. No overt impingement.  T11-12:  No impingement.  T12-L1:  No impingement.  Diffuse disc bulge.  IMPRESSION: 1. Small amounts of epidural tumor along the anterior margins of the T3 and T10 vertebral bodies. These are not causing significant impingement at this time. 2. Thoracic spondylosis and degenerative disc disease, causing moderate impingement at C3-4 and mild impingement  at T2-3. 3. Diffuse marrow infiltration compatible with widespread osseous metastatic disease. 4. Small to moderate bilateral pleural effusions.   Electronically Signed   By: Sherryl Barters M.D.   On: 10/09/2013 17:32   Mr Lumbar Spine W Wo Contrast  10/09/2013   CLINICAL DATA:  Metastatic breast cancer to bone.  Back pain.  EXAM: MRI LUMBAR SPINE WITHOUT AND WITH CONTRAST  TECHNIQUE: Multiplanar and multiecho pulse sequences of the lumbar spine were obtained without and with intravenous contrast.  CONTRAST:  24m MULTIHANCE GADOBENATE DIMEGLUMINE 529 MG/ML IV SOLN  COMPARISON:  08/12/2013  FINDINGS: The diffuse osseous metastatic disease. Extensive enhancing osseous tumor. No definite epidural tumor in the lumbar spine. Lower thoracic spine levels discussed under separate report.  There is 4 mm of degenerative retrolisthesis at L2-3 and 3 mm degenerative retrolisthesis at L3-4. The conus medullaris appears normal. Conus level: T12- L1.  Additional findings at individual levels are as follows:  L1-2:  No impingement.  Disc bulge and left facet arthropathy.  L2-3: Mild displacement of the right L2 nerve by disc osteophyte complex in the right lateral extraforaminal space. Mild central narrowing of the thecal sac due to underlying disc bulge.  L3-4: Moderate to prominent left foraminal stenosis with mild left subarticular lateral recess stenosis and borderline right foraminal stenosis secondary to facet and intervertebral spurring along with disc bulge.  L4-5: Mild to moderate bilateral foraminal stenosis and mild left subarticular lateral recess stenosis due to facet arthropathy, disc bulge, and intervertebral spurring.  L5-S1: Mild left subarticular lateral recess stenosis due to facet arthropathy.  IMPRESSION: 1. Lumbar spondylosis and degenerative disc disease, causing moderate to prominent impingement at L3-4, mild to moderate impingement at L4-5, and mild impingement at L2-3 and L5-S 1, as detailed above.  2. Diffuse osseous metastatic disease. No definite lumbar epidural tumor.   Electronically Signed   By: WSherryl BartersM.D.   On: 10/09/2013 17:37    ASSESSMENT: 63y.o. [place] woman   (1)  status post Left breast and Left axillary lymph node biopsy Dec 2010 for a cT3 pN1 (stage IIIA) invasive ductal carcinoma, grade 2, estrogen and progesterone receptor positive, HER-2 negative, with an MIB-1 of 44%;   (2)  treated neoadjuvantly with dose dense cyclophosphamide and doxorubicin x4 followed by weekly paclitaxel x7  (3) s/p bilateral mastectomies May 2011. She had a residual 1.5 cm tumor, and one of 8 sampled lymph nodes was positive [ypT1c ypN1]. Margins were negative.   (4)  She completed post-mastectomy radiation in August of 2011   (5) on letrozole between August 2011 and October 2014  (6) PET scan 11/05/2012 obtained to evaluate right chest wall pain documents bony metastatic disease to the right seventh rib, L1, and the right acetabulum with an associated pathologic fracture through the anterior cortex of the acetabulum measuring 3.2 cm. There was no evidence of extra osseous metastatic disease.  (7) starting 11/05/2012 a received fulvestrant, 500 mg monthly, in addition to monthly infusions of zoledronic acid. The fulvestrant was discontinued May 2015 with evidence of progression  (8) Right rib, left hip and right hip were all treated to 37.5 Gy in 15 fractions at 2.5 Gy per fraction completed 12/17/2012  (  9) 02/19/2013 bone marrow biopsy confirms metastatic disease to bone. HER-2 was not done because of concerns regarding because of fine the specimen. Estrogen and progesterone receptors were read as negative. This i was felt to be possibly discordant.  (10) patient's tumor carries a PIK3 mutation as documented at [place]  (11) L ribcage pain: s/p palliative XRT completed 06/16/2013  (12) declined participation in a capecitabine plus PIK 3  inhibitor at [facility]; started capecitabine alone 06/27/2013, discontinued after one week because of side effects;  (a) resumed 08/17/2013 at 500 mg twice a day one-week on one-week off  (b) dose being increased gradually, to receive 1000 mg BID with cycle starting 09/28/2013  (13) radiation to Right sacrum/ lower lumbar spine 06/10 to 06/23    [name]:  Brooke [name] is tolerating the capecitabine well. She will proceed with cycle 5 keeping the dose at 1548m BID, 7 days on and 7 days off. The cbc and cmet were reviewed in detail and were stable. The CA 27.29 and CEA were are yet available to view. She will continue to manage her pain with the 241mfentanyl patches and dialudid PRN. The regimen to control her nausea and constipation will not change.   PaFraser Dinad a MRI of the lumbar and thoracic spine. There was no cord compression, but there is a significant amount of degenerative disease noted. We will refer her to Dr. RiSuella Broado see if injections to this area would be of some help. In the meantime, PaFraser Dinill proceed to radiation starting this Wednesday for 10 days.   PaFraser Dinill return in 1 week for an office visit. PaFraser Dinnd PhAbbe Amsterdamnderstand and agree with this [name]. The know a goal of treatment in her case is control. They have been encouraged to call with any issues that might arise before their next visit here.   FeMarcelino DusterNP  10/12/2013  ADDENDUM: Even though perhaps pain is still poorly controlled, it seems to be better on her current medications. I discussed a suggestion that she see Dr. ramus or on other pain specialists for epidural treatments, especially since there is quite a bit of degenerative disease mixed in with her bone metastases.  She had such a good response to radiation before she would like to put this off.  We're going ahead and placing a referral to Dr. ramus, since likely it will be 3 or 4 weeks before you can see her. We can always cancel the appointment if she gets an  excellent response to the radiation.  She is tolerating the capecitabine much better and I am hoping as we proceed to full dose she will start having a systemic response which will bring down her pain medication needs.  I personally saw this patient and performed a substantive portion of this encounter with the listed APP documented above.   MAChauncey CruelMD

## 2013-10-12 NOTE — Addendum Note (Signed)
Addended by: Chauncey Cruel on: 10/12/2013 07:47 PM   Modules accepted: Level of Service

## 2013-10-12 NOTE — Telephone Encounter (Signed)
per pof to sch pt appt-gave pt copy of sch-sch referral per office Dr Nelva Bush no longer does pain management-sent email to [facility] pt will contact after response

## 2013-10-13 DIAGNOSIS — Z510 Encounter for antineoplastic radiation therapy: Secondary | ICD-10-CM | POA: Diagnosis not present

## 2013-10-13 LAB — CANCER ANTIGEN 27.29: CA 27.29: 342 U/mL — ABNORMAL HIGH (ref 0–39)

## 2013-10-14 ENCOUNTER — Ambulatory Visit
Admission: RE | Admit: 2013-10-14 | Discharge: 2013-10-14 | Disposition: A | Discharge status: Home / Self Care | Payer: Medicare Other | Source: Ambulatory Visit | Attending: Radiation Oncology | Admitting: Radiation Oncology

## 2013-10-14 ENCOUNTER — Telehealth: Payer: Self-pay | Admitting: Emergency Medicine

## 2013-10-14 DIAGNOSIS — Z510 Encounter for antineoplastic radiation therapy: Secondary | ICD-10-CM | POA: Diagnosis not present

## 2013-10-14 DIAGNOSIS — C50919 Malignant neoplasm of unspecified site of unspecified female breast: Secondary | ICD-10-CM

## 2013-10-14 DIAGNOSIS — C50512 Malignant neoplasm of lower-outer quadrant of left female breast: Secondary | ICD-10-CM

## 2013-10-14 DIAGNOSIS — C7951 Secondary malignant neoplasm of bone: Secondary | ICD-10-CM

## 2013-10-14 NOTE — Progress Notes (Signed)
  Radiation Oncology         [phone]) [phone] ________________________________  Name: Brooke [name] MRN: [id number]  Date: 10/14/2013  DOB: 28-Apr-1950  Simulation Verification Note  Status: outpatient  NARRATIVE: The patient was brought to the treatment unit and placed in the planned treatment position. The clinical setup was verified. Then port films were obtained and uploaded to the radiation oncology medical record software.  The treatment beams were carefully compared against the planned radiation fields. The position location and shape of the radiation fields was reviewed. The targeted volume of tissue appears appropriately covered by the radiation beams. Organs at risk appear to be excluded as planned.  Based on my personal review, I approved the simulation verification. The patient's treatment will proceed as planned.  ------------------------------------------------  Thea Silversmith, MD

## 2013-10-14 NOTE — Telephone Encounter (Signed)
Spoke with Brooke [name]; per patient's spouse he has sent a disc with Brooke [name] recent scans to Dr Benjie Karvonen at [facility].   Dr Benjie Karvonen would like an email or phone call stating it's ok for her to review the scans and speak to him regarding plan of care.

## 2013-10-15 ENCOUNTER — Ambulatory Visit
Admission: RE | Admit: 2013-10-15 | Discharge: 2013-10-15 | Disposition: A | Discharge status: Home / Self Care | Payer: Medicare Other | Source: Ambulatory Visit | Attending: Radiation Oncology | Admitting: Radiation Oncology

## 2013-10-15 ENCOUNTER — Other Ambulatory Visit: Payer: Self-pay | Admitting: Oncology

## 2013-10-15 DIAGNOSIS — Z510 Encounter for antineoplastic radiation therapy: Secondary | ICD-10-CM | POA: Diagnosis not present

## 2013-10-16 ENCOUNTER — Ambulatory Visit
Admission: RE | Admit: 2013-10-16 | Discharge: 2013-10-16 | Disposition: A | Discharge status: Home / Self Care | Payer: Medicare Other | Source: Ambulatory Visit | Attending: Radiation Oncology | Admitting: Radiation Oncology

## 2013-10-16 DIAGNOSIS — Z510 Encounter for antineoplastic radiation therapy: Secondary | ICD-10-CM | POA: Diagnosis not present

## 2013-10-19 ENCOUNTER — Ambulatory Visit
Admission: RE | Admit: 2013-10-19 | Discharge: 2013-10-19 | Disposition: A | Discharge status: Home / Self Care | Payer: Medicare Other | Source: Ambulatory Visit | Attending: Radiation Oncology | Admitting: Radiation Oncology

## 2013-10-19 ENCOUNTER — Encounter: Payer: Self-pay | Admitting: Radiation Oncology

## 2013-10-19 DIAGNOSIS — Z510 Encounter for antineoplastic radiation therapy: Secondary | ICD-10-CM | POA: Diagnosis not present

## 2013-10-19 NOTE — Progress Notes (Signed)
  Radiation Oncology         [phone]) [phone] ________________________________  Name: Brooke [name] MRN: [id number]  Date: 10/19/2013  DOB: 1950/07/27  Simulation Verification Note  Status: outpatient  NARRATIVE: The patient was brought to the treatment unit and placed in the planned treatment position. The clinical setup was verified. Then port films were obtained and uploaded to the radiation oncology medical record software.  The treatment beams were carefully compared against the planned radiation fields. The position location and shape of the radiation fields was reviewed. They targeted volume of tissue appears to be appropriately covered by the radiation beams. Organs at risk appear to be excluded as planned.  Based on my personal review, I approved the simulation verification. The patient's treatment will proceed as planned.  -----------------------------------  Blair Promise, PhD, MD

## 2013-10-20 ENCOUNTER — Ambulatory Visit
Admission: RE | Admit: 2013-10-20 | Discharge: 2013-10-20 | Disposition: A | Discharge status: Home / Self Care | Payer: Medicare Other | Source: Ambulatory Visit | Attending: Radiation Oncology | Admitting: Radiation Oncology

## 2013-10-20 VITALS — BP 124/73 | HR 89 | Temp 97.6°F | Wt 185.9 lb

## 2013-10-20 DIAGNOSIS — Z510 Encounter for antineoplastic radiation therapy: Secondary | ICD-10-CM | POA: Diagnosis not present

## 2013-10-20 DIAGNOSIS — C50919 Malignant neoplasm of unspecified site of unspecified female breast: Secondary | ICD-10-CM

## 2013-10-20 DIAGNOSIS — C7951 Secondary malignant neoplasm of bone: Principal | ICD-10-CM

## 2013-10-20 NOTE — Progress Notes (Signed)
Weekly assessment of radiation to right rib cage and left upper chest.States pain has improved since starting radiation.Currently on fentanyl patch 25 mcg and dilaudid 4 mg qid.More alert today.Xeloda on hold untiol completion of radiation.

## 2013-10-20 NOTE — Progress Notes (Signed)
Weekly Management Note Current Dose:  10 Gy  Projected Dose: 30 Gy   Narrative:  The patient presents for routine under treatment assessment.  CBCT/MVCT images/Port film x-rays were reviewed.  The chart was checked. Doing well. Pain under better control. "I can breathe!" Thinking about a trip at the end of RT.   Physical Findings: Weight: 185 lb 14.4 oz (84.324 kg). Unchanged  Impression:  The patient is tolerating radiation.  Plan:  Continue treatment as planned. We discussed that she really has reached the maximum amount of radiation she could have to her chest/thoracic spine area. We discussed the implications in terms of risk of radiation pneumonitis and bone marrow reserve.  I encouraged her to continue on the xeloda.

## 2013-10-21 ENCOUNTER — Ambulatory Visit
Admission: RE | Admit: 2013-10-21 | Discharge: 2013-10-21 | Disposition: A | Discharge status: Home / Self Care | Payer: Medicare Other | Source: Ambulatory Visit | Attending: Radiation Oncology | Admitting: Radiation Oncology

## 2013-10-21 DIAGNOSIS — Z510 Encounter for antineoplastic radiation therapy: Secondary | ICD-10-CM | POA: Diagnosis not present

## 2013-10-22 ENCOUNTER — Ambulatory Visit (HOSPITAL_BASED_OUTPATIENT_CLINIC_OR_DEPARTMENT_OTHER): Payer: Medicare Other | Admitting: Nurse Practitioner

## 2013-10-22 ENCOUNTER — Telehealth: Payer: Self-pay | Admitting: *Deleted

## 2013-10-22 ENCOUNTER — Ambulatory Visit: Payer: Medicare Other | Admitting: Obstetrics & Gynecology

## 2013-10-22 ENCOUNTER — Ambulatory Visit
Admission: RE | Admit: 2013-10-22 | Discharge: 2013-10-22 | Disposition: A | Discharge status: Home / Self Care | Payer: Medicare Other | Source: Ambulatory Visit | Attending: Radiation Oncology | Admitting: Radiation Oncology

## 2013-10-22 ENCOUNTER — Telehealth: Payer: Self-pay | Admitting: Oncology

## 2013-10-22 VITALS — BP 126/71 | HR 102 | Temp 98.4°F | Resp 18 | Ht 64.0 in | Wt 184.8 lb

## 2013-10-22 DIAGNOSIS — Z510 Encounter for antineoplastic radiation therapy: Secondary | ICD-10-CM | POA: Diagnosis not present

## 2013-10-22 DIAGNOSIS — M25551 Pain in right hip: Secondary | ICD-10-CM | POA: Insufficient documentation

## 2013-10-22 DIAGNOSIS — R0781 Pleurodynia: Secondary | ICD-10-CM | POA: Diagnosis not present

## 2013-10-22 DIAGNOSIS — C50919 Malignant neoplasm of unspecified site of unspecified female breast: Secondary | ICD-10-CM | POA: Diagnosis not present

## 2013-10-22 DIAGNOSIS — C7951 Secondary malignant neoplasm of bone: Secondary | ICD-10-CM

## 2013-10-22 MED ORDER — FENTANYL 25 MCG/HR TD PT72: 25.0000 ug | MEDICATED_PATCH | TRANSDERMAL | Status: DC

## 2013-10-22 NOTE — Progress Notes (Addendum)
ID: Brooke [name]   DOB: 1950/04/19  MR#: [id number]  QMG#:[id number]  PCP: Irven Shelling, MD GYN: SU:  OTHER MD: Thea Silversmith, Gaynelle Arabian, [name] [phone]), Willa Rough DDS  CHIEF COMPLAINT: stage IV breast cancer CURRENT THERAPY: zolendronate, low-dose capecitabine  BREAST CANCER HISTORY: From the January 2011 summary:  Brooke [name] had screening mammography in August 04, 2007 which showed no specific mammographic evidence of malignancy, but she reported a left breast lump.  Accordingly, she was brought back on August 11, 2007 for mammography and ultrasonography.  There was indeed an obscured mass in the upper left breast which by ultrasound appeared to be a simple cyst.    Screening mammogram on August 04, 2008 showed in addition to dense breasts, again a possible mass in the left breast.  She had diagnostic mammography August 06, 2008 and additional views in addition to very dense breasts did not show any persistent mass or distortion.  Furthermore, Dr. Owens Shark was not able to palpate any abnormality in the lateral portion of the left breast.  Ultrasound showed normal appearing fibroglandular tissue in the area in question.    More recently, about September, the patient felt again something like a lump in the left breast and she had some pain associated with this.  This was initially felt simply to be mastalgia and was treated as such, but as it persisted, on December 23rd, the patient had left diagnostic mammography and ultrasonography.  There was a focally dense area in the left breast with very minimal distortion corresponding to the area of palpable abnormality. The ultrasound showed an irregular ill-defined, hypoechoic area with distal shadowing, measuring approximately 1 cm corresponding to the patient's palpable abnormality.    With this information, the patient was brought back for ultrasound-guided core biopsy and this was performed December 28th. 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  There was no evidence of angiolymphatic invasion in the material submitted which measured 1.5 cm maximally.  In addition to the mass in the breast, a lymph node in the left axilla was biopsied and was likewise positive.  The tumor was ER positive at 89%, PR positive at 34% with an elevated MIB-1 at 44% and Her-2 negative with a ratio of 1.13.    Her subsequent history is as detailed below.  INTERVAL HISTORY: Brooke [name] returns for follow up of her metastatic breast cancer, accompanied by her husband, Brooke [name]. Her capecitabine (156m BID, 7 days on, 7days off) is on hold while she completes radiation. She has 7 treatments left, finishing on 10/10. She states that she already feels an improvement with her ability to take a deep breath and her left rib cage hurts less. She continues to take about 19mof dilaudid daily, coupled with 2570mentanyl patches. She denies constipation, and uses Miralax daily with stool softeners. Her main complaint with radiation so far is that the treatments "wipe her out" with increasing levels of fatigue.   REVIEW OF SYSTEMS: Brooke Dinnies fevers or chills. She has some occasional nausea managed well with prochlorperazine. She has a single mouth sore to the roof of her mouth, but she has magic mouthwash at home available for use.  She has some shortness of breath with exertion and the occasional dry cough. She has no chest pain, headaches, or visual changes. She is dizzy upon standing sometimes. A detailed review of systems is otherwise noncontributory.    PAST MEDICAL HISTORY: Past Medical History  Diagnosis Date  . Hx Breast Cancer, IDC, Stahe III, receptor + 10/20/2010    left breast  . Breast cancer December 2010    invasive ductal and invasive lobular; bilateral mastectomy and radiation  . Mitral valve prolapse   . History of recurrent UTIs   . Radiation 07/26/09-09/08/09    left breast  .  Fracture, pelvis closed 11/2012    both hipsand number 7 rib, 15 rounds radiation  1. Remote migraines, currently inactive.   2. History of prior diagnosis of fibromyalgia made at [facility] and currently not active (she was treated with trimethoprim and nortriptyline for one year for this).    3. History of recurrent UTIs.   4. History of benign left breast cyst aspirated in 2004 at [facility] under Dr. Melene Plan.   5. History of tonsillectomy and adenoidectomy.   6. History of right LASEK surgery. 7. History of bilateral reduction mammoplasties in 1994.   8. History of mitral valve prolapse diagnosed in 1984.  PAST SURGICAL HISTORY: Past Surgical History  Procedure Laterality Date  . Mastectomy Bilateral 2011    left breast cancer  . Lasik    . Reduction mammaplasty Bilateral 1994  . Tonsillectomy and adenoidectomy    . Aspiration of left breast Left 2004    [facility]Dr.Teo  . Bone biopsy  02/19/13    mets from breast cancer-receptors results not back    FAMILY HISTORY Family History  Problem Relation Age of Onset  . Lung cancer Father 71    smoker for 25 years  . Heart disease Maternal Grandmother   . Heart disease Maternal Grandfather   . Diabetes type I Mother   . Diabetes type I Sister   . Diabetes type I Sister   . Heart disease Maternal Uncle   . Uterine cancer Paternal Grandmother 37  . Breast cancer Other 76    paternal great grandmother  [name] died at the age of 4 from lung cancer.  He had been a remote smoker and had been in [employer] with a question of possible asbestos exposure.  [name] died at the age of 58 in the setting of Alzheimer's, coronary artery disease, diabetes and hypertension.  The patient had two sisters.  One died from complications of diabetes.  The other one is alive with diabetes.    GYNECOLOGIC HISTORY: She is GX P1.  First pregnancy to term at age 55.  The patient's  last period was in 2008.  She took hormone replacement for about 18 months until mid-2008.  She had no complications from that treatment.    SOCIAL HISTORY: (Updated October 2014) She used to be a Arts development officer for [employer]  They used to live in the [place] area. 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  Her husband, Brooke [name], retired October 2012.  He was a Arts development officer for 28 years.  Their son, [name], is currently with the [employer] at the [employer] in [place]. [name].  She attends [employer].   ADVANCED DIRECTIVES: in place  HEALTH MAINTENANCE: (Updated 11/19/2012) History  Substance Use Topics  . Smoking status: Former Smoker -- 1.00 packs/day for 11 years    Quit date: 01/23/1984  . Smokeless tobacco: Never Used  . Alcohol Use: 1.5 - 2.5 oz/week    3-5 drink(s) per week     Comment: glasses of  wine     Colonoscopy: 2010  PAP: Jan 2014  Bone density: July 2014, osteopenia  Lipid panel: per Dr Valentina Lucks  Allergies  Allergen Reactions  . Sulfa Antibiotics Shortness Of Breath  . Codeine Nausea And Vomiting    Tolerates oxycodone if given with prochlorperazine for nausea  . Morphine And Related Nausea And Vomiting    Per patient possibility that the IR morphine was causing the vomiting   Current outpatient prescriptions:HYDROmorphone (DILAUDID) 4 MG tablet, Take by mouth every 4 (four) hours as needed for severe pain., Disp: , Rfl: ;  AFLURIA PRESERVATIVE FREE 0.5 ML SUSY, , Disp: , Rfl: ;  ALPRAZolam (XANAX XR) 1 MG 24 hr tablet, Up to twice daily as needed for anxiety, Disp: 90 tablet, Rfl: 0;  Calcium Citrate (CITRACAL PO), Take 3 tablets by mouth daily., Disp: , Rfl:  capecitabine (XELODA) 500 MG tablet, Pt takes 3 in the morning and 3 in the night, Disp: , Rfl: ;  cholecalciferol (VITAMIN D) 400 UNITS TABS tablet, Take 400 Units by mouth daily. Patient reportd the Vitamin E should  be vitamin D with same dose, Disp: , Rfl: ;  cholestyramine (QUESTRAN) 4 G packet, Take 1 packet (4 g total) by mouth 2 (two) times daily as needed., Disp: 60 each, Rfl: 12 diphenhydrAMINE (BENADRYL) 25 mg capsule, Take 25 mg by mouth at bedtime as needed for sleep. , Disp: , Rfl: ;  Docusate Calcium (STOOL SOFTENER PO), Take by mouth daily., Disp: , Rfl: ;  fentaNYL (DURAGESIC - DOSED MCG/HR) 25 MCG/HR patch, Place 1 patch (25 mcg total) onto the skin every 3 (three) days., Disp: 10 patch, Rfl: 0;  gabapentin (NEURONTIN) 300 MG capsule, TAKE 1 CAPSULE (300 MG TOTAL) BY MOUTH AT BEDTIME., Disp: 90 capsule, Rfl: 0 ondansetron (ZOFRAN) 8 MG tablet, Take 1 tablet (8 mg total) by mouth 2 (two) times daily as needed for nausea or vomiting., Disp: 30 tablet, Rfl: 6;  Polyethyl Glycol-Propyl Glycol (SYSTANE) 0.4-0.3 % SOLN, Apply 1 drop to eye 2 (two) times daily as needed (dry eyes)., Disp: , Rfl: ;  Polyethylene Glycol 3350 (MIRALAX PO), Take by mouth daily., Disp: , Rfl:  prochlorperazine (COMPAZINE) 5 MG tablet, Take 5 mg by mouth 3 (three) times daily., Disp: , Rfl: ;  Vaginal Lubricant (REPLENS VA), Place vaginally. Combining with estroglide, Disp: , Rfl: ;  venlafaxine XR (EFFEXOR-XR) 75 MG 24 hr capsule, Take 2 capsules (150 mg total) by mouth daily with breakfast., Disp: 180 capsule, Rfl: 1;  Zoledronic Acid (ZOMETA IV), Inject into the vein. , Disp: , Rfl:   OBJECTIVE: Middle-aged white woman who appears stated age  25 Vitals:   10/22/13 1245  BP: 126/71  Pulse: 102  Temp: 98.4 F (36.9 C)  Resp: 18   ECOG 2  Sclerae unicteric, pupils equal and reactive Oropharynx clear and moist-- no thrush No cervical or supraclavicular adenopathy Lungs no rales or rhonchi Heart regular rate and rhythm Abd soft, nontender, positive bowel sounds MSK no focal spinal tenderness, no upper extremity lymphedema Neuro: nonfocal, well oriented, appropriate affect Breasts: deferred   LAB RESULTS: Results  for ADALEAH, FORGET (MRN [id number]) as of 09/24/2013 15:58  Ref. Range 07/02/2013 11:28 07/27/2013 12:08 07/27/2013 12:12 08/17/2013 13:17 08/28/2013 08:53  CA 27.29 Latest Range: 0-39 U/mL   474 (H) 449 (H) 394 (H)  CEA Latest Range: 0.0-5.0 ng/mL 152.6 (H) 177.2 (H)  148.1 (H) 134.0 (H)    Lab Results  Component Value Date  WBC 4.5 10/12/2013   NEUTROABS 2.8 10/12/2013   HGB 11.6 10/12/2013   HCT 35.5 10/12/2013   MCV 91.6 10/12/2013   PLT 258 10/12/2013      Chemistry      Component Value Date/Time   NA 138 10/12/2013 1329   NA 141 03/29/2013 0350   NA 142 06/26/2011 0728   K 4.6 10/12/2013 1329   K 3.7 03/29/2013 0350   K 4.6 06/26/2011 0728   CL 111 03/29/2013 0350   CL 105 01/07/2012 1347   CL 102 06/26/2011 0728   CO2 26 10/12/2013 1329   CO2 18* 03/29/2013 0350   CO2 30 06/26/2011 0728   BUN 12.5 10/12/2013 1329   BUN 10 03/29/2013 0350   BUN 12 06/26/2011 0728   CREATININE 1.1 10/12/2013 1329   CREATININE 0.72 03/29/2013 0350   CREATININE 1.1 06/26/2011 0728      Component Value Date/Time   CALCIUM 9.8 10/12/2013 1329   CALCIUM 7.8* 03/29/2013 0350   CALCIUM 9.1 06/26/2011 0728   ALKPHOS 139 10/12/2013 1329   ALKPHOS 91 04/30/2013 1301   AST 34 10/12/2013 1329   AST 67* 04/30/2013 1301   ALT 22 10/12/2013 1329   ALT 93* 04/30/2013 1301   BILITOT 0.28 10/12/2013 1329   BILITOT 0.4 04/30/2013 1301       Lab Results  Component Value Date   LABCA2 342* 10/12/2013    STUDIES: Mr Thoracic Spine W Wo Contrast  10/09/2013   CLINICAL DATA:  Back pain. Reduced range of motion. Metastatic breast cancer to bone.  EXAM: MRI THORACIC SPINE WITHOUT AND WITH CONTRAST  TECHNIQUE: Multiplanar and multiecho pulse sequences of the thoracic spine were obtained without and with intravenous contrast.  CONTRAST:  17 cc MultiHance  COMPARISON:  Nuclear medicine PET-CT 08/12/2013.  FINDINGS: Scout images through the cervical spine demonstrate spondylosis and degenerative disc disease probably causing cervical impingement. This  could be characterized with dedicated cervical spine MRI, if clinically warranted.  Bilateral small to moderate pleural effusions.  Diffuse metastatic disease in the thoracic spine with heterogeneous signal intensity. Chronic appearing mild superior endplate compression fractures at T10, T11, and T12.  1.1 x 0.4 cm focus of epidural tumor along the right anterior thecal sac at T10. Anterior epidural tumor noted extending along the vertebral body and pedicles at T3, relatively mild. Posterior contour of the T12 vertebral body likely due to underlying tumor.  No significant abnormal spinal cord signal is observed. No vertebral subluxation is observed. Additional findings at individual levels are as follows:  T1-2:  No impingement.  Mild right facet overgrowth.  T2-3: Mild right and borderline left foraminal stenosis due to facet arthropathy.  T3-4: Moderate right and mild left foraminal stenosis due to facet arthropathy.  T4-5: Borderline central narrowing of the thecal sac due to central disc protrusion.  T5-6:  No impingement.  Shallow left lateral recess disc protrusion.  C6-7:  No impingement.  Mild disc bulge.  T7-8:  No impingement.  Diffuse disc bulge.  T8-9: Borderline bilateral foraminal stenosis due to facet arthropathy and disc bulge.  T9-10:  No impingement.  Left facet arthropathy.  T10-11: Right anterior epidural tumor at T10. No overt impingement.  T11-12:  No impingement.  T12-L1:  No impingement.  Diffuse disc bulge.  IMPRESSION: 1. Small amounts of epidural tumor along the anterior margins of the T3 and T10 vertebral bodies. These are not causing significant impingement at this time. 2. Thoracic spondylosis and degenerative disc disease, causing  moderate impingement at C3-4 and mild impingement at T2-3. 3. Diffuse marrow infiltration compatible with widespread osseous metastatic disease. 4. Small to moderate bilateral pleural effusions.   Electronically Signed   By: Sherryl Barters M.D.   On:  10/09/2013 17:32   Mr Lumbar Spine W Wo Contrast  10/09/2013   CLINICAL DATA:  Metastatic breast cancer to bone.  Back pain.  EXAM: MRI LUMBAR SPINE WITHOUT AND WITH CONTRAST  TECHNIQUE: Multiplanar and multiecho pulse sequences of the lumbar spine were obtained without and with intravenous contrast.  CONTRAST:  45m MULTIHANCE GADOBENATE DIMEGLUMINE 529 MG/ML IV SOLN  COMPARISON:  08/12/2013  FINDINGS: The diffuse osseous metastatic disease. Extensive enhancing osseous tumor. No definite epidural tumor in the lumbar spine. Lower thoracic spine levels discussed under separate report.  There is 4 mm of degenerative retrolisthesis at L2-3 and 3 mm degenerative retrolisthesis at L3-4. The conus medullaris appears normal. Conus level: T12- L1.  Additional findings at individual levels are as follows:  L1-2:  No impingement.  Disc bulge and left facet arthropathy.  L2-3: Mild displacement of the right L2 nerve by disc osteophyte complex in the right lateral extraforaminal space. Mild central narrowing of the thecal sac due to underlying disc bulge.  L3-4: Moderate to prominent left foraminal stenosis with mild left subarticular lateral recess stenosis and borderline right foraminal stenosis secondary to facet and intervertebral spurring along with disc bulge.  L4-5: Mild to moderate bilateral foraminal stenosis and mild left subarticular lateral recess stenosis due to facet arthropathy, disc bulge, and intervertebral spurring.  L5-S1: Mild left subarticular lateral recess stenosis due to facet arthropathy.  IMPRESSION: 1. Lumbar spondylosis and degenerative disc disease, causing moderate to prominent impingement at L3-4, mild to moderate impingement at L4-5, and mild impingement at L2-3 and L5-S 1, as detailed above. 2. Diffuse osseous metastatic disease. No definite lumbar epidural tumor.   Electronically Signed   By: WSherryl BartersM.D.   On: 10/09/2013 17:37    ASSESSMENT: 63y.o. [place] woman   (1)   status post Left breast and Left axillary lymph node biopsy Dec 2010 for a cT3 pN1 (stage IIIA) invasive ductal carcinoma, grade 2, estrogen and progesterone receptor positive, HER-2 negative, with an MIB-1 of 44%;   (2)  treated neoadjuvantly with dose dense cyclophosphamide and doxorubicin x4 followed by weekly paclitaxel x7  (3) s/p bilateral mastectomies May 2011. She had a residual 1.5 cm tumor, and one of 8 sampled lymph nodes was positive [ypT1c ypN1]. Margins were negative.   (4)  She completed post-mastectomy radiation in August of 2011   (5) on letrozole between August 2011 and October 2014  (6) PET scan 11/05/2012 obtained to evaluate right chest wall pain documents bony metastatic disease to the right seventh rib, L1, and the right acetabulum with an associated pathologic fracture through the anterior cortex of the acetabulum measuring 3.2 cm. There was no evidence of extra osseous metastatic disease.  (7) starting 11/05/2012 a received fulvestrant, 500 mg monthly, in addition to monthly infusions of zoledronic acid. The fulvestrant was discontinued May 2015 with evidence of progression  (8) Right rib, left hip and right hip were all treated to 37.5 Gy in 15 fractions at 2.5 Gy per fraction completed 12/17/2012  (9) 02/19/2013 bone marrow biopsy confirms metastatic disease to bone. HER-2 was not done because of concerns regarding because of fine the specimen. Estrogen and progesterone receptors were read as negative. This i was felt to be possibly discordant.  (10)  patient's tumor carries a PIK3 mutation as documented at [facility]  (11) L ribcage pain: s/p palliative XRT completed 06/16/2013  (12) declined participation in a capecitabine plus PIK 3 inhibitor at [facility]; started capecitabine alone 06/27/2013, discontinued after one week because of side effects;  (a) resumed 08/17/2013 at 500 mg twice a day one-week on one-week off  (b) dose being increased  gradually, to receive 1000 mg BID with cycle starting 09/28/2013  (c) full dose of 1567m BID started 10/12/13  (13) radiation to Right sacrum/ lower lumbar spine 06/10 to 06/23    PLAN:  It is great that PFraser Dinis feeling some relief with palliative radiation with still half a dozen treatments left to go. However, she has been told by Dr. MJana Hakimand Dr. WPablo Ledgerthat after these treatments she is about "maxed out" on radiologic therapy. Luckily we have achieved an analgesic regimen that is currently covering her pain. Dr. MJana Hakimencouraged her to be as physically active as possible as these treatments are to help her "do more."  The plan is to complete 2 more cycles of the full dose of capecitabine, then have a restaging PET scan performed in early November. She will start cycle 6 on 10/12, as she will be done with radiation by that point. She is due for Zometa on 10/26, and she will also have labs and an office visit on that day. Pat understands and agrees with this plan. She knows the goal of treatment in her case is control. She has been encouraged to call with any issues that might arise before her next visit here.   FMarcelino Duster NP  ADDENDUM: PFraser Dinappears to be having a good response to the radiation. I discussed this with Dr. WPablo Ledger She doesn't think she can give the patient any further radiation in the future, because of overlapping fields. In addition of course radiation sterilizes the bone marrow and compromises our ability to give the patient chemotherapy.  I am hopeful she will continue to respond to the capecitabine now that we have gone to full dose. Ideally if we can get another 2 cycles in before restaging, we will get the best information.  I have [facility] MBenjie Karvonen She is in agreement with the current plan.  In the meantime still is showing obvious signs of stress, with chest pain likely due  to reflux taking him to the emergency room. We discussed some strategies to reduce his current stress level, but the overall situation of course is very difficult (recall that in addition to packs problems, feel smaller died recently and he is the one in charge of the estate).  I personally saw this patient and performed a substantive portion of this encounter with the listed APP documented above.   MChauncey Cruel MD

## 2013-10-22 NOTE — Telephone Encounter (Signed)
per pof to sch pt appt-MW moved trmt per pof -gave pt copy of sch-adv pt that [facility] will call & sch PET

## 2013-10-22 NOTE — Telephone Encounter (Signed)
Per staff message and POF I have scheduled appts. Advised scheduler of appts. JMW  

## 2013-10-23 ENCOUNTER — Other Ambulatory Visit: Payer: Self-pay | Admitting: *Deleted

## 2013-10-23 ENCOUNTER — Encounter: Payer: Self-pay | Admitting: Nurse Practitioner

## 2013-10-23 ENCOUNTER — Ambulatory Visit
Admission: RE | Admit: 2013-10-23 | Discharge: 2013-10-23 | Disposition: A | Discharge status: Home / Self Care | Payer: Medicare Other | Source: Ambulatory Visit | Attending: Radiation Oncology | Admitting: Radiation Oncology

## 2013-10-23 ENCOUNTER — Telehealth: Payer: Self-pay | Admitting: Nurse Practitioner

## 2013-10-23 DIAGNOSIS — C7951 Secondary malignant neoplasm of bone: Principal | ICD-10-CM

## 2013-10-23 DIAGNOSIS — Z510 Encounter for antineoplastic radiation therapy: Secondary | ICD-10-CM | POA: Diagnosis not present

## 2013-10-23 DIAGNOSIS — C50919 Malignant neoplasm of unspecified site of unspecified female breast: Secondary | ICD-10-CM

## 2013-10-23 NOTE — Telephone Encounter (Signed)
, °

## 2013-10-26 ENCOUNTER — Ambulatory Visit
Admission: RE | Admit: 2013-10-26 | Discharge: 2013-10-26 | Disposition: A | Discharge status: Home / Self Care | Payer: Medicare Other | Source: Ambulatory Visit | Attending: Radiation Oncology | Admitting: Radiation Oncology

## 2013-10-26 DIAGNOSIS — Z510 Encounter for antineoplastic radiation therapy: Secondary | ICD-10-CM | POA: Diagnosis not present

## 2013-10-27 ENCOUNTER — Ambulatory Visit
Admission: RE | Admit: 2013-10-27 | Discharge: 2013-10-27 | Disposition: A | Discharge status: Home / Self Care | Payer: Medicare Other | Source: Ambulatory Visit | Attending: Radiation Oncology | Admitting: Radiation Oncology

## 2013-10-27 ENCOUNTER — Encounter: Payer: Self-pay | Admitting: Radiation Oncology

## 2013-10-27 VITALS — BP 119/71 | HR 93 | Temp 98.7°F | Resp 20 | Wt 182.8 lb

## 2013-10-27 DIAGNOSIS — C7951 Secondary malignant neoplasm of bone: Principal | ICD-10-CM

## 2013-10-27 DIAGNOSIS — Z510 Encounter for antineoplastic radiation therapy: Secondary | ICD-10-CM | POA: Diagnosis not present

## 2013-10-27 DIAGNOSIS — C50919 Malignant neoplasm of unspecified site of unspecified female breast: Secondary | ICD-10-CM

## 2013-10-27 NOTE — Progress Notes (Signed)
Weekly Management Note Current Dose:  30 Gy to left femur and ribs completed 21 Gy of 30 Gy to the spine.   Narrative:  The patient presents for routine under treatment assessment.  CBCT/MVCT images/Port film x-rays were reviewed.  The chart was checked. Pain continues but she is breathing better. Nausea dn confusion continue. Fatigue continues.   Physical Findings: Weight: 182 lb 12.8 oz (82.918 kg). Unchanged. Able to walk with minimal pain. Participates in discussion.   Impression:  The patient is tolerating radiation.  Plan:  Continue treatment as planned. Follow up in 1 month. After discussion, decided to leave pain meds the same.

## 2013-10-27 NOTE — Progress Notes (Signed)
Weekly rad txs left femur 10/10 completed today, and right rib 10/10 ,  T9-L1 spine 7/10 completd, today., pain right rib side 9/10,scale other 2 sites 7/10 scale, has 25 mcg fentanyl patch left arm, changed on 10/26/13, just took dilaudid 2mg , po , averaging 18mg  total 24 hours on dilaudid stated patient husband, appetite appetite none at dinner time  but eats good breakfast and lunch, very fatigued, nausea 2x a day , compazine works well stated,  4:17 PM

## 2013-10-28 ENCOUNTER — Ambulatory Visit
Admission: RE | Admit: 2013-10-28 | Discharge: 2013-10-28 | Disposition: A | Discharge status: Home / Self Care | Payer: Medicare Other | Source: Ambulatory Visit | Attending: Radiation Oncology | Admitting: Radiation Oncology

## 2013-10-28 DIAGNOSIS — Z510 Encounter for antineoplastic radiation therapy: Secondary | ICD-10-CM | POA: Diagnosis not present

## 2013-10-29 ENCOUNTER — Ambulatory Visit
Admission: RE | Admit: 2013-10-29 | Discharge: 2013-10-29 | Disposition: A | Discharge status: Home / Self Care | Payer: Medicare Other | Source: Ambulatory Visit | Attending: Radiation Oncology | Admitting: Radiation Oncology

## 2013-10-29 DIAGNOSIS — Z510 Encounter for antineoplastic radiation therapy: Secondary | ICD-10-CM | POA: Diagnosis not present

## 2013-10-30 ENCOUNTER — Encounter: Payer: Self-pay | Admitting: Radiation Oncology

## 2013-10-30 ENCOUNTER — Ambulatory Visit
Admission: RE | Admit: 2013-10-30 | Discharge: 2013-10-30 | Disposition: A | Discharge status: Home / Self Care | Payer: Medicare Other | Source: Ambulatory Visit | Attending: Radiation Oncology | Admitting: Radiation Oncology

## 2013-10-30 ENCOUNTER — Other Ambulatory Visit (HOSPITAL_BASED_OUTPATIENT_CLINIC_OR_DEPARTMENT_OTHER): Payer: Medicare Other

## 2013-10-30 DIAGNOSIS — C7951 Secondary malignant neoplasm of bone: Secondary | ICD-10-CM

## 2013-10-30 DIAGNOSIS — C773 Secondary and unspecified malignant neoplasm of axilla and upper limb lymph nodes: Secondary | ICD-10-CM

## 2013-10-30 DIAGNOSIS — Z510 Encounter for antineoplastic radiation therapy: Secondary | ICD-10-CM | POA: Diagnosis not present

## 2013-10-30 DIAGNOSIS — C50919 Malignant neoplasm of unspecified site of unspecified female breast: Secondary | ICD-10-CM

## 2013-10-30 DIAGNOSIS — C50512 Malignant neoplasm of lower-outer quadrant of left female breast: Secondary | ICD-10-CM

## 2013-10-30 DIAGNOSIS — M79603 Pain in arm, unspecified: Secondary | ICD-10-CM

## 2013-10-30 LAB — COMPREHENSIVE METABOLIC PANEL (CC13)
ALT: 13 U/L (ref 0–55)
ANION GAP: 6 meq/L (ref 3–11)
AST: 28 U/L (ref 5–34)
Albumin: 3.3 g/dL — ABNORMAL LOW (ref 3.5–5.0)
Alkaline Phosphatase: 162 U/L — ABNORMAL HIGH (ref 40–150)
BILIRUBIN TOTAL: 0.28 mg/dL (ref 0.20–1.20)
BUN: 9.7 mg/dL (ref 7.0–26.0)
CO2: 27 mEq/L (ref 22–29)
Calcium: 9.9 mg/dL (ref 8.4–10.4)
Chloride: 105 mEq/L (ref 98–109)
Creatinine: 0.8 mg/dL (ref 0.6–1.1)
Glucose: 135 mg/dl (ref 70–140)
Potassium: 4.1 mEq/L (ref 3.5–5.1)
Sodium: 139 mEq/L (ref 136–145)
Total Protein: 6.7 g/dL (ref 6.4–8.3)

## 2013-10-30 LAB — CBC WITH DIFFERENTIAL/PLATELET
BASO%: 0.2 % (ref 0.0–2.0)
BASOS ABS: 0 10*3/uL (ref 0.0–0.1)
EOS ABS: 0.5 10*3/uL (ref 0.0–0.5)
EOS%: 12.6 % — AB (ref 0.0–7.0)
HCT: 36.2 % (ref 34.8–46.6)
HEMOGLOBIN: 11.8 g/dL (ref 11.6–15.9)
LYMPH%: 6.6 % — AB (ref 14.0–49.7)
MCH: 30.3 pg (ref 25.1–34.0)
MCHC: 32.7 g/dL (ref 31.5–36.0)
MCV: 92.5 fL (ref 79.5–101.0)
MONO#: 0.5 10*3/uL (ref 0.1–0.9)
MONO%: 12.1 % (ref 0.0–14.0)
NEUT%: 68.5 % (ref 38.4–76.8)
NEUTROS ABS: 2.6 10*3/uL (ref 1.5–6.5)
PLATELETS: 193 10*3/uL (ref 145–400)
RBC: 3.91 10*6/uL (ref 3.70–5.45)
RDW: 15 % — ABNORMAL HIGH (ref 11.2–14.5)
WBC: 3.8 10*3/uL — ABNORMAL LOW (ref 3.9–10.3)
lymph#: 0.2 10*3/uL — ABNORMAL LOW (ref 0.9–3.3)

## 2013-10-31 LAB — CEA: CEA: 85.5 ng/mL — AB (ref 0.0–5.0)

## 2013-10-31 LAB — CANCER ANTIGEN 27.29: CA 27.29: 337 U/mL — AB (ref 0–39)

## 2013-11-02 NOTE — Progress Notes (Signed)
  Radiation Oncology         [phone] ________________________________  Name: Brooke [name] MRN: [id number]  Date: 10/30/2013  DOB: 05-14-1950  End of Treatment Note  Diagnosis:   Metastatic breast cancer to bone     Indication for treatment:  Palliative       Radiation treatment dates:   10/14/2013-10/30/2013  Site/dose:   All the sites listed below received 30 Gy in 10 fractions: Right ribs (re treatment) T9-L1 Left femur  Beams/energy:   Right ribs were treated with opposed obliques/tangent fields with 10 MV photons while the femur was treated with 15 MV photons AP/PA and the spine was treated with a 3 field Y technique with 6 and 15 MV photons. A composite plan was created and taken into account for all fields in light of her previous treatments to or near these areas.   Narrative: The patient tolerated radiation treatment relatively well.   She had improvement in her pain although not dramatically. Her dose of narcotic remained the same throughout her treatment.   Plan: The patient has completed radiation treatment. The patient will return to radiation oncology clinic for routine followup in one month. I advised them to call or return sooner if they have any questions or concerns related to their recovery or treatment.  ------------------------------------------------  Thea Silversmith, MD

## 2013-11-03 ENCOUNTER — Ambulatory Visit (INDEPENDENT_AMBULATORY_CARE_PROVIDER_SITE_OTHER): Payer: Medicare Other | Admitting: Obstetrics & Gynecology

## 2013-11-03 VITALS — BP 98/60 | HR 60 | Resp 16 | Wt 181.8 lb

## 2013-11-03 DIAGNOSIS — N870 Mild cervical dysplasia: Secondary | ICD-10-CM

## 2013-11-04 ENCOUNTER — Encounter: Payer: Self-pay | Admitting: Obstetrics & Gynecology

## 2013-11-04 DIAGNOSIS — N870 Mild cervical dysplasia: Secondary | ICD-10-CM | POA: Insufficient documentation

## 2013-11-04 LAB — IPS PAP SMEAR ONLY

## 2013-11-04 NOTE — Progress Notes (Signed)
Subjective:     Patient ID: Brooke [name], female   DOB: October 22, 1950, 63 y.o.   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  Pathology with LEEP was CIN1 as well as some changes consistent with HR HPV.  Margins were positive although there was significant cautery effect.  Pt denies any vaginal bleeding or discharge complaints.  On palliative treatment for breast cancer with bone metastases.  Having a lot of fatigue.  D/W pt unless there are high grade findings in her Pap smear, I would not recommend anything except conservative follow-up.  Husband is with patient today and he is in agreement with this plan.  Pt, previously, desired a hysterectomy, which I felt was overly aggressive therapy.  She is now trying to optimize her quality of life, as much as possible.  Spouse states the cancer is really taking over everything, at this time.  Review of Systems  Gastrointestinal: Negative.   Genitourinary: Negative.        Objective:   Physical Exam  Constitutional: She is oriented to person, place, and time. She appears well-developed and well-nourished.  Genitourinary: Vagina normal and uterus normal. There is no rash, tenderness or lesion on the right labia. There is no rash, tenderness or lesion on the left labia.  Well healed ectropion present.  Musculoskeletal: Normal range of motion.  Lymphadenopathy:       Right: No inguinal adenopathy present.       Left: No inguinal adenopathy present.  Neurological: She is alert and oriented to person, place, and time.  Skin: Skin is warm and dry.  Psychiatric: She has a normal mood and affect.       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  As long as there is no high grade findings, will follow conservatively.

## 2013-11-09 ENCOUNTER — Emergency Department (HOSPITAL_COMMUNITY)
Admission: EM | Admit: 2013-11-09 | Discharge: 2013-11-09 | Disposition: A | Discharge status: Home / Self Care | Payer: Medicare Other | Attending: Emergency Medicine | Admitting: Emergency Medicine

## 2013-11-09 ENCOUNTER — Other Ambulatory Visit: Payer: Self-pay | Admitting: Oncology

## 2013-11-09 ENCOUNTER — Telehealth: Payer: Self-pay | Admitting: *Deleted

## 2013-11-09 ENCOUNTER — Encounter (HOSPITAL_COMMUNITY): Payer: Self-pay | Admitting: Emergency Medicine

## 2013-11-09 ENCOUNTER — Emergency Department (HOSPITAL_COMMUNITY): Payer: Medicare Other

## 2013-11-09 DIAGNOSIS — C799 Secondary malignant neoplasm of unspecified site: Secondary | ICD-10-CM

## 2013-11-09 DIAGNOSIS — Z923 Personal history of irradiation: Secondary | ICD-10-CM | POA: Diagnosis not present

## 2013-11-09 DIAGNOSIS — Z8679 Personal history of other diseases of the circulatory system: Secondary | ICD-10-CM | POA: Insufficient documentation

## 2013-11-09 DIAGNOSIS — Z87891 Personal history of nicotine dependence: Secondary | ICD-10-CM | POA: Diagnosis not present

## 2013-11-09 DIAGNOSIS — C7951 Secondary malignant neoplasm of bone: Secondary | ICD-10-CM | POA: Insufficient documentation

## 2013-11-09 DIAGNOSIS — Z79899 Other long term (current) drug therapy: Secondary | ICD-10-CM | POA: Insufficient documentation

## 2013-11-09 DIAGNOSIS — Z87440 Personal history of urinary (tract) infections: Secondary | ICD-10-CM | POA: Diagnosis not present

## 2013-11-09 DIAGNOSIS — S329XXA Fracture of unspecified parts of lumbosacral spine and pelvis, initial encounter for closed fracture: Secondary | ICD-10-CM

## 2013-11-09 DIAGNOSIS — M84550A Pathological fracture in neoplastic disease, pelvis, initial encounter for fracture: Secondary | ICD-10-CM | POA: Diagnosis not present

## 2013-11-09 DIAGNOSIS — Z853 Personal history of malignant neoplasm of breast: Secondary | ICD-10-CM | POA: Insufficient documentation

## 2013-11-09 DIAGNOSIS — M25552 Pain in left hip: Secondary | ICD-10-CM | POA: Diagnosis present

## 2013-11-09 DIAGNOSIS — M549 Dorsalgia, unspecified: Secondary | ICD-10-CM | POA: Diagnosis not present

## 2013-11-09 HISTORY — DX: Secondary malignant neoplasm of bone: C79.51

## 2013-11-09 MED ORDER — HYDROMORPHONE HCL 2 MG/ML IJ SOLN
2.0000 mg | Freq: Once | INTRAMUSCULAR | Status: AC
  Administered 2013-11-09: 2 mg via INTRAVENOUS
  Filled 2013-11-09: qty 1

## 2013-11-09 MED ORDER — OXYCODONE HCL 5 MG PO TABS: 10.0000 mg | ORAL_TABLET | Freq: Four times a day (QID) | ORAL | Status: DC | PRN

## 2013-11-09 MED ORDER — ONDANSETRON HCL 4 MG/2ML IJ SOLN
4.0000 mg | Freq: Once | INTRAMUSCULAR | Status: AC
  Administered 2013-11-09: 4 mg via INTRAVENOUS
  Filled 2013-11-09: qty 2

## 2013-11-09 NOTE — Discharge Instructions (Signed)
You are weight bearing as tolerated.  Stable Pelvic Fracture You have one or more fractures (this means there is a break in the bones) of the pelvis. The pelvis is the ring of bones that make up your hipbones. These are the bones you sit on and the lower part of the spine. It is like a boney ring where your legs attach and which supports your upper body. You have an undisplaced fracture. This means the bones are in good position. The pelvic fracture you have is a simple (uncomplicated) fracture. DIAGNOSIS  X-rays usually diagnose these fractures. TREATMENT  The goal of treating pelvic fractures is to get the bones to heal in a good position. The patient should return to normal activities as soon as possible. Such fractures are often treated with normal bed rest and conservative measures.  HOME CARE INSTRUCTIONS   You should be on bed rest for as long as directed by your caregiver. Change positions of your legs every 1-2 hours to maintain good blood flow. You may sit as long as is tolerable. Following this, you may do usual activities, but avoid strenuous activities for as long as directed by your caregiver.  Only take over-the-counter or prescription medicines for pain, discomfort, or fever as directed by your caregiver.  Bed rest may also be used for discomfort.  Resume your activities when you are able. Use a cane or crutch on the injured side to reduce pain while walking, as needed.  If you develop increased pain or discomfort not relieved with medications, contact your caregiver.  Warning: Do not drive a car or operate a motor vehicle until your caregiver specifically tells you it is safe to do so. SEEK IMMEDIATE MEDICAL CARE IF:   You feel light-headed or faint, develop chest pain or shortness of breath.  An unexplained oral temperature above 102 F (38.9 C) develops.  You develop blood in the urine or in the stools.  There is difficulty urinating, and/or having a bowel movement,  or pain with these efforts.  There is a difficulty or increased pain with walking.  There is swelling in one or both legs that is not normal. Document Released: 03/19/2001 Document Revised: 05/25/2013 Document Reviewed: 08/22/2007 [facility] Patient Information 2015 [place], [place]. This information is not intended to replace advice given to you by your health care provider. Make sure you discuss any questions you have with your health care provider.

## 2013-11-09 NOTE — Telephone Encounter (Signed)
Brooke [name] called reporting Der. Magrinat asked for notification when he arrives with wife to the ER.  Reports they are at the [facility] and Dr. Jana Hakim can reach him by mobile number [phone].  Will notify providers.

## 2013-11-09 NOTE — ED Provider Notes (Signed)
CSN: [id number]     Arrival date & time 11/09/13  1127 History   First MD Initiated Contact with Patient 11/09/13 1210     Chief Complaint  Patient presents with  . Hip Pain     (Consider location/radiation/quality/duration/timing/severity/associated sxs/prior Treatment) HPI  This is a 63 year old female with history of metastatic breast cancer and multiple metastasis to the bone he presents with left hip and back pain. Patient reports that she was walking up steps on Saturday when she noted increasing left hip pain. She reports difficulty ambulating since that time and has required full assistance at home. She is on a fentanyl patch and tablet for breakthrough pain. She denies any weakness, numbness, or tingling of her legs. The pain is noticed most over her left hip and pelvis. She denies any obvious injury.  Past Medical History  Diagnosis Date  . Hx Breast Cancer, IDC, Stahe III, receptor + 10/20/2010    left breast  . Breast cancer December 2010    invasive ductal and invasive lobular; bilateral mastectomy and radiation  . Mitral valve prolapse   . History of recurrent UTIs   . Radiation 07/26/09-09/08/09    left breast  . Fracture, pelvis closed 11/2012    both hipsand number 7 rib, 15 rounds radiation  . Metastasis to bone    Past Surgical History  Procedure Laterality Date  . Mastectomy Bilateral 2011    left breast cancer  . Lasik    . Reduction mammaplasty Bilateral 1994  . Tonsillectomy and adenoidectomy    . Aspiration of left breast Left 2004    [facility]/Dr.Teo  . Bone biopsy  02/19/13    mets from breast cancer-receptors results not back   Family History  Problem Relation Age of Onset  . Lung cancer Father 37    smoker for 25 years  . Heart disease Maternal Grandmother   . Heart disease Maternal Grandfather   . Diabetes type I Mother   . Diabetes type I Sister   . Diabetes type I Sister   . Heart disease Maternal Uncle   . Uterine cancer  Paternal Grandmother 58  . Breast cancer Other 92    paternal great grandmother   History  Substance Use Topics  . Smoking status: Former Smoker -- 1.00 packs/day for 11 years    Quit date: 01/23/1984  . Smokeless tobacco: Never Used  . Alcohol Use: 1.5 - 2.5 oz/week    3-5 drink(s) per week     Comment: glasses of wine   OB History   Grav Para Term Preterm Abortions TAB SAB Ect Mult Living   3 1 1       1      Review of Systems  Musculoskeletal:       Left hip and pelvis pain  Neurological: Negative for weakness and numbness.  All other systems reviewed and are negative.     Allergies  Sulfa antibiotics; Codeine; and Morphine and related  Home Medications   Prior to Admission medications   Medication Sig Start Date End Date Taking? Authorizing Provider  AFLURIA PRESERVATIVE FREE 0.5 ML SUSY  09/18/13  Yes Historical Provider, MD  ALPRAZolam (XANAX XR) 1 MG 24 hr tablet Take 1 mg by mouth daily as needed for anxiety (anxiety).   Yes Historical Provider, MD  Calcium Citrate (CITRACAL PO) Take 3 tablets by mouth daily.   Yes Historical Provider, MD  capecitabine (XELODA) 500 MG tablet Take 1,500 mg by mouth 2 (two)  times daily after a meal.   Yes Historical Provider, MD  cholecalciferol (VITAMIN D) 400 UNITS TABS tablet Take 400 Units by mouth daily. Patient reportd the Vitamin E should be vitamin D with same dose   Yes Self Requested  diphenhydrAMINE (BENADRYL) 25 mg capsule Take 25 mg by mouth at bedtime as needed for sleep (sleep).    Yes Historical Provider, MD  fentaNYL (DURAGESIC - DOSED MCG/HR) 25 MCG/HR patch Place 1 patch (25 mcg total) onto the skin every 3 (three) days. 10/22/13  Yes Marcelino Duster, NP  gabapentin (NEURONTIN) 300 MG capsule Take 300 mg by mouth at bedtime.   Yes Historical Provider, MD  HYDROmorphone (DILAUDID) 4 MG tablet Take 4 mg by mouth every 4 (four) hours as needed for severe pain (pain).    Yes Historical Provider, MD  ondansetron (ZOFRAN)  8 MG tablet Take 8 mg by mouth 2 (two) times daily as needed for nausea or vomiting (nausea & vomiting).   Yes Historical Provider, MD  Polyethyl Glycol-Propyl Glycol (SYSTANE) 0.4-0.3 % SOLN Apply 1 drop to eye 2 (two) times daily as needed (dry eyes).   Yes Historical Provider, MD  Polyethylene Glycol 3350 (MIRALAX PO) Take 30 mLs by mouth daily.    Yes Historical Provider, MD  prochlorperazine (COMPAZINE) 5 MG tablet Take 5 mg by mouth 3 (three) times daily. 04/30/13  Yes Historical Provider, MD  Vaginal Lubricant (REPLENS VA) Place vaginally. Combining with estroglide   Yes Historical Provider, MD  venlafaxine XR (EFFEXOR-XR) 75 MG 24 hr capsule Take 2 capsules (150 mg total) by mouth daily with breakfast. 07/27/13  Yes Chauncey Cruel, MD  oxyCODONE (ROXICODONE) 5 MG immediate release tablet Take 2 tablets (10 mg total) by mouth every 6 (six) hours as needed for severe pain or breakthrough pain. 11/09/13   Merryl Hacker, MD  Zoledronic Acid (ZOMETA IV) Inject into the vein. Every 90 Days.    Historical Provider, MD   BP 111/68  Pulse 94  Temp(Src) 98.1 F (36.7 C) (Oral)  Resp 12  SpO2 95%  LMP 01/23/2007 Physical Exam  Nursing note and vitals reviewed. Constitutional: She is oriented to person, place, and time.  Ill-appearing but nontoxic  HENT:  Head: Normocephalic and atraumatic.  Cardiovascular: Normal rate, regular rhythm and normal heart sounds.   No murmur heard. Pulmonary/Chest: Effort normal and breath sounds normal. No respiratory distress. She has no wheezes.  Abdominal: Soft. Bowel sounds are normal. There is no tenderness.  Musculoskeletal:  Range of motion of the left hip intact, tenderness over the left posterior pelvis, no lumbar spine tenderness, 2+ DP pulse, good sensation and strength distally  Neurological: She is alert and oriented to person, place, and time.  Skin: Skin is warm and dry.  Psychiatric: She has a normal mood and affect.    ED Course   Procedures (including critical care time) Labs Review Labs Reviewed - No data to display  Imaging Review Dg Pelvis 1-2 Views  11/09/2013   CLINICAL DATA:  Initial encounter for left hip pain without history of injury. Patient was climbing stairs about 3 days ago when pain started.  EXAM: PELVIS - 1-2 VIEW  COMPARISON:  None.  FINDINGS: There is no evidence of pelvic fracture or diastasis. No pelvic bone lesions are seen.  IMPRESSION: Negative.   Electronically Signed   By: Misty Stanley M.D.   On: 11/09/2013 13:22   Ct Pelvis Wo Contrast  11/09/2013   CLINICAL DATA:  Left hip pain beginning present left hip pain since 11/07/2013 after walking upstairs. History of metastatic breast cancer.  EXAM: CT PELVIS WITHOUT CONTRAST  TECHNIQUE: Multidetector CT imaging of the pelvis was performed following the standard protocol without intravenous contrast.  COMPARISON:  Plain film of the pelvis earlier this same day and PET CT scan 08/12/2013.  FINDINGS: Multiple lytic and sclerotic lesions are identified consistent with the patient's history of breast cancer. Progressive bony destructive change is seen in the left ischial tuberosity and posterior inferior pubic ramus. There appears to be a nondisplaced fracture through the ischial tuberosity. No other fracture is identified. Both hips are located. No hip joint effusion is seen. Imaged intrapelvic contents are unremarkable.  IMPRESSION: Extensive osseous metastases with progressive lysis and sclerosis in the left distal tuberosity where a nondisplaced fracture is identified.   Electronically Signed   By: Inge Rise M.D.   On: 11/09/2013 14:20     EKG Interpretation None      MDM   Final diagnoses:  Pelvic fracture, closed, initial encounter  Metastatic disease    Patient presents with worsened left hip and pelvis pain. No obvious injury the patient has known metastases to the pelvis. Imaging notable for CT scan with a nondisplaced fracture of  the issue of tuberosity on the left. This corresponds with patient's pain. Discuss with Dr. Veverly Fells, orthopedics who states that there is no cervical intervention indicated at this time. Would recommend physical therapy and pain control. Discussed with Dr. Jana Hakim who agrees.  I offered the patient inpatient admission for pain control and physical therapy evaluation versus outpatient physical therapy evaluation at home. Patient would like to go home. Case management involved and orders placed. Patient has artery on her current pain medication at home. Will provide a prescription for Roxicodone for breakthrough pain.  After history, exam, and medical workup I feel the patient has been appropriately medically screened and is safe for discharge home. Pertinent diagnoses were discussed with the patient. Patient was given return precautions.    Merryl Hacker, MD 11/10/13 [phone]

## 2013-11-09 NOTE — Progress Notes (Addendum)
CARE MANAGEMENT ED NOTE 11/09/2013  Patient:  Brooke [name], Brooke [name]   Account Number:  [id number]  Date Initiated:  11/09/2013  Documentation initiated by:  Jackelyn Poling  Subjective/Objective Assessment:   63 yr old medicare [place] pt L hip pain since Saturday when she was walking up the stairs. Pt reports she has not been able to walk. Being treated for bone cancer and radiation therapy to both hips.     Subjective/Objective Assessment Detail:   pcp [facility]  Also seen by oncologist at cancer center per female at bedside  [facility] of [facility] health agency is [facility] for service  confirms not DME at home     Action/Plan:   Consulted by Dr Dina Rich to assist with home health services Recommended Home health PT OT RN aide for this pt Pending orders Choice offered to pt and female at bedside Female took list of agencies from pt when Cm offered it to her   Action/Plan Detail:   Anticipated DC Date:  11/09/2013     Status Recommendation to Physician:   Result of Recommendation:    Other ED Services  Consult Working [place]  Other  Outpatient Services - Pt will follow up   [place]   Choice offered to / List presented to:  C-1 Patient     [place] arranged  HH-1 RN  [place] agency  [place]    Status of service:  Completed, signed off  ED Comments:   ED Comments Detail:   10/20/151532 Cm received a voice message (CM office at 1217) from husband phil who inquired about [facility] seeing pt today.  CM spoke [facility] at [facility] to provide referral for pt second choice of provider.  CM spoke with [place] at [place] would stated pt on schedule for Wednesday Cm discussed with her that [place] informed CM on 11/09/13 that pt would be seen on 11/10/13 1527 Lecretia returned a call to CM informing her that a nurse would be out to see pt at 1430 today Cm informed pt & spouse of this at 46 Husband request canceling  rolling walker until pt is seen by [place].  CM spoke with [facility] of [facility] at 1529 to cancel walker CM discussed possible rolator need with husband. He voiced appreciation of services rendered Cm encouraged a return call if needed At [place] updated on hold on referral  11/10/13 0931 Cm able to successfully get fax sent to [facility] at [phone] clinicals also included [facility] EDP progress notes for  11/09/13 11/09/13 1828 Referral clinical information including face sheet, HH order, face to face, (no EDP progress note at this time) [facility]ed to [facility] [place] office [phone] x [phone] (failures) then to [phone] with tx failures x 2  11/09/13 1657 Left message for Lecretia at [facility] for standard walker  1654 Cm updated husband about Janeece Riggers stating the start of services will be on 11/10/13 vs 11/11/13 Husband agrees with this start date of 1020/15 and with delivery of standard walker to home [place] husband consulted about start of service starting on Wednesday and his choice changed to [facility] Filler updated and she consulted with her staff, returned to [place] call to [facility] to call in referral for home health Spoke with [facility] F[facility] who states services not available until Wednesday Cm spoke with  EDP about DME Order for rolling walker provided

## 2013-11-09 NOTE — ED Notes (Signed)
Pt reports L hip pain since Saturday when she was walking up the stairs. Pt reports she has not been able to walk. Being treated for bone cancer and radiation therapy to both hips.

## 2013-11-12 ENCOUNTER — Other Ambulatory Visit: Payer: Self-pay | Admitting: *Deleted

## 2013-11-12 NOTE — Progress Notes (Signed)
11/12/13 1205 Cm was able to speak with Phil at [phone] He states issue has been resolved and [facility] decided pt need a 4 leg walker and is helping to reprocess the order Encouraged a return call as needed 1158 received a voice message left for Cm (office) from husband from 11/11/13 at 1239 Stating pt received a walker from [facility] even after the attempt to stop the shipment.  Husband inquired what to do with walker.  ED CM consulted Lucretia of [facility] ([phone]) who confirms 1) if box not opened it can be sent back via UPS or 2) if box opened the walker can be taken to the [place] home care office and credit be received.  Cm left Mr [name] a voice message at cell number he left [phone] explaining this him

## 2013-11-13 ENCOUNTER — Other Ambulatory Visit: Payer: Self-pay | Admitting: *Deleted

## 2013-11-13 ENCOUNTER — Other Ambulatory Visit: Payer: Self-pay | Admitting: Oncology

## 2013-11-13 DIAGNOSIS — M25551 Pain in right hip: Secondary | ICD-10-CM

## 2013-11-13 DIAGNOSIS — C7951 Secondary malignant neoplasm of bone: Principal | ICD-10-CM

## 2013-11-13 DIAGNOSIS — C50919 Malignant neoplasm of unspecified site of unspecified female breast: Secondary | ICD-10-CM

## 2013-11-13 MED ORDER — HYDROMORPHONE HCL 4 MG PO TABS: 4.0000 mg | ORAL_TABLET | ORAL | Status: DC | PRN

## 2013-11-13 MED ORDER — PROCHLORPERAZINE MALEATE 5 MG PO TABS: 5.0000 mg | ORAL_TABLET | Freq: Four times a day (QID) | ORAL | Status: AC | PRN

## 2013-11-13 MED ORDER — ALPRAZOLAM ER 1 MG PO TB24: 1.0000 mg | ORAL_TABLET | Freq: Every day | ORAL | Status: DC

## 2013-11-13 NOTE — Telephone Encounter (Signed)
Per call from MD - appointments for Monday cancelled- requested zometa same day as appointment with MD in 2 weeks.

## 2013-11-16 ENCOUNTER — Other Ambulatory Visit: Payer: Medicare Other

## 2013-11-16 ENCOUNTER — Other Ambulatory Visit: Payer: Self-pay | Admitting: *Deleted

## 2013-11-16 ENCOUNTER — Ambulatory Visit: Payer: Medicare Other | Admitting: Nurse Practitioner

## 2013-11-16 ENCOUNTER — Ambulatory Visit: Payer: Medicare Other

## 2013-11-16 ENCOUNTER — Telehealth: Payer: Self-pay | Admitting: *Deleted

## 2013-11-16 ENCOUNTER — Telehealth: Payer: Self-pay | Admitting: Oncology

## 2013-11-16 NOTE — Telephone Encounter (Signed)
Per staff message and POF I have scheduled appts. Advised scheduler of appts. JMW  

## 2013-11-16 NOTE — Telephone Encounter (Signed)
, °

## 2013-11-17 ENCOUNTER — Ambulatory Visit: Payer: Medicare Other

## 2013-11-17 ENCOUNTER — Other Ambulatory Visit: Payer: Medicare Other

## 2013-11-18 ENCOUNTER — Other Ambulatory Visit: Payer: Self-pay | Admitting: *Deleted

## 2013-11-20 ENCOUNTER — Other Ambulatory Visit: Payer: Self-pay | Admitting: *Deleted

## 2013-11-23 ENCOUNTER — Encounter (HOSPITAL_COMMUNITY): Payer: Self-pay | Admitting: Emergency Medicine

## 2013-11-24 ENCOUNTER — Other Ambulatory Visit: Payer: Self-pay | Admitting: Oncology

## 2013-11-26 ENCOUNTER — Ambulatory Visit (HOSPITAL_COMMUNITY): Payer: Medicare Other

## 2013-11-27 ENCOUNTER — Ambulatory Visit (HOSPITAL_COMMUNITY)
Admission: RE | Admit: 2013-11-27 | Discharge: 2013-11-27 | Disposition: A | Discharge status: Home / Self Care | Payer: Medicare Other | Source: Ambulatory Visit | Attending: Nurse Practitioner | Admitting: Nurse Practitioner

## 2013-11-27 ENCOUNTER — Other Ambulatory Visit: Payer: Self-pay | Admitting: Emergency Medicine

## 2013-11-27 DIAGNOSIS — J189 Pneumonia, unspecified organism: Secondary | ICD-10-CM | POA: Insufficient documentation

## 2013-11-27 DIAGNOSIS — C7951 Secondary malignant neoplasm of bone: Secondary | ICD-10-CM | POA: Diagnosis not present

## 2013-11-27 DIAGNOSIS — J918 Pleural effusion in other conditions classified elsewhere: Secondary | ICD-10-CM | POA: Diagnosis not present

## 2013-11-27 DIAGNOSIS — J9811 Atelectasis: Secondary | ICD-10-CM | POA: Insufficient documentation

## 2013-11-27 DIAGNOSIS — C50919 Malignant neoplasm of unspecified site of unspecified female breast: Secondary | ICD-10-CM | POA: Diagnosis not present

## 2013-11-27 LAB — GLUCOSE, CAPILLARY: Glucose-Capillary: 88 mg/dL (ref 70–99)

## 2013-11-27 MED ORDER — FLUDEOXYGLUCOSE F - 18 (FDG) INJECTION
9.0100 | Freq: Once | INTRAVENOUS | Status: AC | PRN
Start: 2013-11-27 — End: 2013-11-27
  Administered 2013-11-27: 9.01 via INTRAVENOUS

## 2013-11-28 NOTE — Progress Notes (Signed)
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 Given her previous chest wall treatment, a three field plan was created.  The radiation prescription was entered and confirmed.   TREATMENT PLANNING NOTE:  Treatment planning then occurred. I have requested 3D simulation with [facility] of the spinal cord, total lungs and gross tumor volume. I have also requested mlcs and an isodose plan.   A total of 3 complex treatment devices were formed in the shape of MLCs which were used to protect critical normal structures including the previously treated chest wall, spine and lungs.

## 2013-11-30 ENCOUNTER — Ambulatory Visit (HOSPITAL_BASED_OUTPATIENT_CLINIC_OR_DEPARTMENT_OTHER): Payer: Medicare Other | Admitting: Oncology

## 2013-11-30 ENCOUNTER — Other Ambulatory Visit (HOSPITAL_BASED_OUTPATIENT_CLINIC_OR_DEPARTMENT_OTHER): Payer: Medicare Other

## 2013-11-30 ENCOUNTER — Ambulatory Visit (HOSPITAL_BASED_OUTPATIENT_CLINIC_OR_DEPARTMENT_OTHER): Payer: Medicare Other

## 2013-11-30 ENCOUNTER — Telehealth: Payer: Self-pay | Admitting: Oncology

## 2013-11-30 VITALS — BP 116/67 | HR 97 | Temp 98.1°F | Resp 18 | Ht 64.0 in | Wt 185.5 lb

## 2013-11-30 DIAGNOSIS — C50919 Malignant neoplasm of unspecified site of unspecified female breast: Secondary | ICD-10-CM

## 2013-11-30 DIAGNOSIS — C7951 Secondary malignant neoplasm of bone: Secondary | ICD-10-CM

## 2013-11-30 DIAGNOSIS — R0781 Pleurodynia: Secondary | ICD-10-CM

## 2013-11-30 DIAGNOSIS — C773 Secondary and unspecified malignant neoplasm of axilla and upper limb lymph nodes: Secondary | ICD-10-CM

## 2013-11-30 DIAGNOSIS — M79603 Pain in arm, unspecified: Secondary | ICD-10-CM

## 2013-11-30 DIAGNOSIS — C50512 Malignant neoplasm of lower-outer quadrant of left female breast: Secondary | ICD-10-CM

## 2013-11-30 DIAGNOSIS — M25551 Pain in right hip: Secondary | ICD-10-CM

## 2013-11-30 LAB — CBC WITH DIFFERENTIAL/PLATELET
BASO%: 0.4 % (ref 0.0–2.0)
Basophils Absolute: 0 10*3/uL (ref 0.0–0.1)
EOS%: 4 % (ref 0.0–7.0)
Eosinophils Absolute: 0.2 10*3/uL (ref 0.0–0.5)
HCT: 34.8 % (ref 34.8–46.6)
HGB: 11.5 g/dL — ABNORMAL LOW (ref 11.6–15.9)
LYMPH%: 12.4 % — ABNORMAL LOW (ref 14.0–49.7)
MCH: 30.5 pg (ref 25.1–34.0)
MCHC: 33 g/dL (ref 31.5–36.0)
MCV: 92.4 fL (ref 79.5–101.0)
MONO#: 0.6 10*3/uL (ref 0.1–0.9)
MONO%: 12.7 % (ref 0.0–14.0)
NEUT#: 3.6 10*3/uL (ref 1.5–6.5)
NEUT%: 70.5 % (ref 38.4–76.8)
PLATELETS: 158 10*3/uL (ref 145–400)
RBC: 3.77 10*6/uL (ref 3.70–5.45)
RDW: 15.1 % — AB (ref 11.2–14.5)
WBC: 5 10*3/uL (ref 3.9–10.3)
lymph#: 0.6 10*3/uL — ABNORMAL LOW (ref 0.9–3.3)

## 2013-11-30 LAB — COMPREHENSIVE METABOLIC PANEL (CC13)
ALT: 17 U/L (ref 0–55)
ANION GAP: 6 meq/L (ref 3–11)
AST: 26 U/L (ref 5–34)
Albumin: 3.1 g/dL — ABNORMAL LOW (ref 3.5–5.0)
Alkaline Phosphatase: 192 U/L — ABNORMAL HIGH (ref 40–150)
BUN: 11.8 mg/dL (ref 7.0–26.0)
CHLORIDE: 104 meq/L (ref 98–109)
CO2: 24 mEq/L (ref 22–29)
CREATININE: 0.8 mg/dL (ref 0.6–1.1)
Calcium: 9.6 mg/dL (ref 8.4–10.4)
Glucose: 126 mg/dl (ref 70–140)
Potassium: 4.2 mEq/L (ref 3.5–5.1)
Sodium: 133 mEq/L — ABNORMAL LOW (ref 136–145)
Total Bilirubin: 0.36 mg/dL (ref 0.20–1.20)
Total Protein: 6.1 g/dL — ABNORMAL LOW (ref 6.4–8.3)

## 2013-11-30 MED ORDER — SODIUM CHLORIDE 0.9 % IV SOLN
Freq: Once | INTRAVENOUS | Status: AC
  Administered 2013-11-30: 13:00:00 via INTRAVENOUS

## 2013-11-30 MED ORDER — ZOLEDRONIC ACID 4 MG/100ML IV SOLN
4.0000 mg | Freq: Once | INTRAVENOUS | Status: AC
  Administered 2013-11-30: 4 mg via INTRAVENOUS
  Filled 2013-11-30: qty 100

## 2013-11-30 NOTE — Patient Instructions (Signed)

## 2013-11-30 NOTE — Telephone Encounter (Signed)
[facility] PT APPT SCHEDULE FOR NOV 2015 THRU JAN 2016. CHECKED STATUS OF PAIN REFERRAL. CALLED [facility] AND [facility] - PER [facility] REFERRAL IN REVIEW AND THEY WILL CALL PT W/APPT IF PT IS CANDIDATE AND IF NOT THEY WILL LET us KNOW. PT AWARE AND ALSO AWARE REVIEW PROCESS IS OUT ABOUT 3-4 WEEKS.

## 2013-11-30 NOTE — Progress Notes (Signed)
ID: Brooke [name]   DOB: 1950-09-30  MR#: [id number]  XBD#:[id number]  PCP: Irven Shelling, MD GYN: SU:  OTHER MD: Thea Silversmith, Gaynelle Arabian, [name] [phone]), Willa Rough DDS  CHIEF COMPLAINT: stage IV breast cancer CURRENT THERAPY: zolendronate, low-dose capecitabine  BREAST CANCER HISTORY: From the January 2011 summary:  Brooke [name] had screening mammography in August 04, 2007 which showed no specific mammographic evidence of malignancy, but she reported a left breast lump.  Accordingly, she was brought back on August 11, 2007 for mammography and ultrasonography.  There was indeed an obscured mass in the upper left breast which by ultrasound appeared to be a simple cyst.    Screening mammogram on August 04, 2008 showed in addition to dense breasts, again a possible mass in the left breast.  She had diagnostic mammography August 06, 2008 and additional views in addition to very dense breasts did not show any persistent mass or distortion.  Furthermore, Dr. Owens Shark was not able to palpate any abnormality in the lateral portion of the left breast.  Ultrasound showed normal appearing fibroglandular tissue in the area in question.    More recently, about September, the patient felt again something like a lump in the left breast and she had some pain associated with this.  This was initially felt simply to be mastalgia and was treated as such, but as it persisted, on December 23rd, the patient had left diagnostic mammography and ultrasonography.  There was a focally dense area in the left breast with very minimal distortion corresponding to the area of palpable abnormality. The ultrasound showed an irregular ill-defined, hypoechoic area with distal shadowing, measuring approximately 1 cm corresponding to the patient's palpable abnormality.    With this information, the patient was brought back for ultrasound-guided core biopsy and this was performed December 28th. 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  There was no evidence of angiolymphatic invasion in the material submitted which measured 1.5 cm maximally.  In addition to the mass in the breast, a lymph node in the left axilla was biopsied and was likewise positive.  The tumor was ER positive at 89%, PR positive at 34% with an elevated MIB-1 at 44% and Her-2 negative with a ratio of 1.13.    Her subsequent history is as detailed below.  INTERVAL HISTORY: Brooke [name] returns for follow up of her metastatic breast cancer, accompanied by her husband, [name]. Today is day 8 [name] 4 of fold dose capecitabine (1570m BID, 7 days on, 7days off).she usually Brooke [name]. She has not developed any plantar palmar erythrodysesthesia, or mouth sores. She does get some loose bowel movements and has had a couple of nighttime accidents. She denies frank diarrhea.  REVIEW OF SYSTEMS: Brooke [name] Continues to have significant back joint and hip pain. Currently it is very difficult for her to walk, because of the pain. She does have a wheelchair at home and she gets around in the wheelchair. She transfers from wheelchair to commode and back or wheelchair to bed on back with minimal assistance. Brooke [name] installed a "Warden/ranger that carries her up there 22 steps so that she can use both floors of the house. He also has placed the ramp in the garage so she can easily get into the car and he tries to get her out to riding at least once a day.  For the pain she takes diluted usually from 16-20 mg  daily. She is not able to get the pain down to a 3 or less without becoming very groggy and confused. She does become constipated and uses mineral lacks and stool softeners for that. However when she takes the capecitabine she holds those medications because of the possibility of diarrhea. She denies falls, unusual headaches, visual changes, and she has very minimal nausea which she controls with  ondansetron and prochlorperazine. There have been no fevers, rash, or bleeding. Hot flashes are minimal. She is having some "twitching" which is going to be secondary to the narcotics. This is very hard to control with medications and generally adding more medicationsfor this problem only adds more toxicity. A detailed review of systems today was otherwise stable   PAST MEDICAL HISTORY: Past Medical History  Diagnosis Date  . Hx Breast Cancer, IDC, Stahe III, receptor + 10/20/2010    left breast  . Breast cancer December 2010    invasive ductal and invasive lobular; bilateral mastectomy and radiation  . Mitral valve prolapse   . History of recurrent UTIs   . Radiation 07/26/09-09/08/09    left breast  . Fracture, pelvis closed 11/2012    both hipsand number 7 rib, 15 rounds radiation  . Metastasis to bone   1. Remote migraines, currently inactive.   2. History of prior diagnosis of fibromyalgia made at [facility] and currently not active (she was treated with trimethoprim and nortriptyline for one year for this).    3. History of recurrent UTIs.   4. History of benign left breast cyst aspirated in 2004 at [facility] under Dr. Melene Plan.   5. History of tonsillectomy and adenoidectomy.   6. History of right LASEK surgery. 7. History of bilateral reduction mammoplasties in 1994.   8. History of mitral valve prolapse diagnosed in 1984.  PAST SURGICAL HISTORY: Past Surgical History  Procedure Laterality Date  . Mastectomy Bilateral 2011    left breast cancer  . Lasik    . Reduction mammaplasty Bilateral 1994  . Tonsillectomy and adenoidectomy    . Aspiration of left breast Left 2004    [facility]/Dr.Teo  . Bone biopsy  02/19/13    mets from breast cancer-receptors results not back    FAMILY HISTORY Family History  Problem Relation Age of Onset  . Lung cancer Father 3    smoker for 25 years  . Heart disease Maternal Grandmother   . Heart  disease Maternal Grandfather   . Diabetes type I Mother   . Diabetes type I Sister   . Diabetes type I Sister   . Heart disease Maternal Uncle   . Uterine cancer Paternal Grandmother 22  . Breast cancer Other 47    paternal great grandmother  [name] died at the age of 17 from lung cancer.  He had been a remote smoker and had been in [employer] with a question of possible asbestos exposure.  [name] died at the age of 27 in the setting of Alzheimer's, coronary artery disease, diabetes and hypertension.  The patient had two sisters.  One died from complications of diabetes.  The other one is alive with diabetes.    GYNECOLOGIC HISTORY: She is GX P1.  First pregnancy to term at age 27.  The patient's last period was in 2008.  She took hormone replacement for about 18 months until mid-2008.  She had no complications from that treatment.    SOCIAL HISTORY: (Updated October 2014) She used  to be a Arts development officer for [employer]  They used to live in the [place] area. 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  Her husband, [name], retired October 2012.  He was a Arts development officer for 28 years.  Their son, [name], is currently with the [employer] at the [employer] in [place]. [name].  She attends [employer].   ADVANCED DIRECTIVES: in place  HEALTH MAINTENANCE: (Updated 11/19/2012) History  Substance Use Topics  . Smoking status: Former Smoker -- 1.00 packs/day for 11 years    Quit date: 01/23/1984  . Smokeless tobacco: Never Used  . Alcohol Use: 1.5 - 2.5 oz/week    3-5 drink(s) per week     Comment: glasses of wine     Colonoscopy: 2010  PAP: Jan 2014  Bone density: July 2014, osteopenia  Lipid panel: per Dr Valentina Lucks  Allergies  Allergen Reactions  . Sulfa Antibiotics Shortness Of Breath  . Codeine Nausea And Vomiting    Tolerates oxycodone if given with  prochlorperazine for nausea  . Morphine And Related Nausea And Vomiting    Per patient possibility that the IR morphine was causing the vomiting   Current outpatient prescriptions: AFLURIA PRESERVATIVE FREE 0.5 ML SUSY, , Disp: , Rfl: ;  ALPRAZolam (XANAX XR) 1 MG 24 hr tablet, Take 1 tablet (1 mg total) by mouth daily., Disp: 90 tablet, Rfl: 0;  Calcium Citrate (CITRACAL PO), Take 3 tablets by mouth daily., Disp: , Rfl: ;  capecitabine (XELODA) 500 MG tablet, Take 1,500 mg by mouth 2 (two) times daily after a meal., Disp: , Rfl:  cholecalciferol (VITAMIN D) 400 UNITS TABS tablet, Take 400 Units by mouth daily. Patient reportd the Vitamin E should be vitamin D with same dose, Disp: , Rfl: ;  diphenhydrAMINE (BENADRYL) 25 mg capsule, Take 25 mg by mouth at bedtime as needed for sleep (sleep). , Disp: , Rfl: ;  fentaNYL (DURAGESIC - DOSED MCG/HR) 25 MCG/HR patch, Place 1 patch (25 mcg total) onto the skin every 3 (three) days., Disp: 10 patch, Rfl: 0 gabapentin (NEURONTIN) 300 MG capsule, Take 300 mg by mouth at bedtime., Disp: , Rfl: ;  HYDROmorphone (DILAUDID) 4 MG tablet, Take 1-2 tablets (4-8 mg total) by mouth every 4 (four) hours as needed for severe pain (pain)., Disp: 120 tablet, Rfl: 0;  ondansetron (ZOFRAN) 8 MG tablet, Take 8 mg by mouth 2 (two) times daily as needed for nausea or vomiting (nausea & vomiting)., Disp: , Rfl:  oxyCODONE (ROXICODONE) 5 MG immediate release tablet, Take 2 tablets (10 mg total) by mouth every 6 (six) hours as needed for severe pain or breakthrough pain., Disp: 15 tablet, Rfl: 0;  Polyethyl Glycol-Propyl Glycol (SYSTANE) 0.4-0.3 % SOLN, Apply 1 drop to eye 2 (two) times daily as needed (dry eyes)., Disp: , Rfl: ;  Polyethylene Glycol 3350 (MIRALAX PO), Take 30 mLs by mouth daily. , Disp: , Rfl:  prochlorperazine (COMPAZINE) 5 MG tablet, Take 1 tablet (5 mg total) by mouth every 6 (six) hours as needed for nausea or vomiting., Disp: 120 tablet, Rfl: 2;  Vaginal Lubricant  (REPLENS VA), Place vaginally. Combining with estroglide, Disp: , Rfl: ;  venlafaxine XR (EFFEXOR-XR) 75 MG 24 hr capsule, Take 2 capsules (150 mg total) by mouth daily with breakfast., Disp: 180 capsule, Rfl: 1 Zoledronic Acid (ZOMETA IV), Inject into the vein. Every 90 Days., Disp: , Rfl:  OBJECTIVE: Middle-aged white woman examined in a wheelchair  Filed Vitals:   11/30/13 1121  BP: 116/67  Pulse: 97  Temp: 98.1 F (36.7 C)  Resp: 18   ECOG 2  Sclerae unicteric, pupils equal and reactive Oropharynx clear and moist-- no thrushor other lesions No cervical or supraclavicular adenopathy Lungs no rales or rhonchi Heart regular rate and rhythm Abd soft, obese,nontender, positive bowel sounds MSK moderate tenderness over the midthoracic spine, unchanged from baseline Neuro: nonfocal, well oriented, appropriate affect Breasts: deferred   LAB RESULTS: Results for THALIA, TURKINGTON (MRN [id number]) as of 11/30/2013 14:15  Ref. Range 08/17/2013 13:17 08/28/2013 08:53 09/24/2013 14:15 10/12/2013 13:29 10/30/2013 14:31  CA 27.29 Latest Range: 0-39 U/mL 449 (H) 394 (H) 363 (H) 342 (H) 337 (H)  CEA Latest Range: 0.0-5.0 ng/mL 148.1 (H) 134.0 (H) 90.4 (H)  85.5 (H)    Lab Results  Component Value Date   WBC 5.0 11/30/2013   NEUTROABS 3.6 11/30/2013   HGB 11.5* 11/30/2013   HCT 34.8 11/30/2013   MCV 92.4 11/30/2013   PLT 158 11/30/2013      Chemistry      Component Value Date/Time   NA 133* 11/30/2013 1102   NA 141 03/29/2013 0350   NA 142 06/26/2011 0728   K 4.2 11/30/2013 1102   K 3.7 03/29/2013 0350   K 4.6 06/26/2011 0728   CL 111 03/29/2013 0350   CL 105 01/07/2012 1347   CL 102 06/26/2011 0728   CO2 24 11/30/2013 1102   CO2 18* 03/29/2013 0350   CO2 30 06/26/2011 0728   BUN 11.8 11/30/2013 1102   BUN 10 03/29/2013 0350   BUN 12 06/26/2011 0728   CREATININE 0.8 11/30/2013 1102   CREATININE 0.72 03/29/2013 0350   CREATININE 1.1 06/26/2011 0728      Component Value  Date/Time   CALCIUM 9.6 11/30/2013 1102   CALCIUM 7.8* 03/29/2013 0350   CALCIUM 9.1 06/26/2011 0728   ALKPHOS 192* 11/30/2013 1102   ALKPHOS 91 04/30/2013 1301   AST 26 11/30/2013 1102   AST 67* 04/30/2013 1301   ALT 17 11/30/2013 1102   ALT 93* 04/30/2013 1301   BILITOT 0.36 11/30/2013 1102   BILITOT 0.4 04/30/2013 1301       Lab Results  Component Value Date   LABCA2 337* 10/30/2013    STUDIES: Dg Pelvis 1-2 Views  11/09/2013   CLINICAL DATA:  Initial encounter for left hip pain without history of injury. Patient was climbing stairs about 3 days ago when pain started.  EXAM: PELVIS - 1-2 VIEW  COMPARISON:  None.  FINDINGS: There is no evidence of pelvic fracture or diastasis. No pelvic bone lesions are seen.  IMPRESSION: Negative.   Electronically Signed   By: Misty Stanley M.D.   On: 11/09/2013 13:22   Ct Pelvis Wo Contrast  11/09/2013   CLINICAL DATA:  Left hip pain beginning present left hip pain since 11/07/2013 after walking upstairs. History of metastatic breast cancer.  EXAM: CT PELVIS WITHOUT CONTRAST  TECHNIQUE: Multidetector CT imaging of the pelvis was performed following the standard protocol without intravenous contrast.  COMPARISON:  Plain film of the pelvis earlier this same day and PET CT scan 08/12/2013.  FINDINGS: Multiple lytic and sclerotic lesions are identified consistent with the patient's history of breast cancer. Progressive bony destructive change is seen in the left ischial tuberosity and posterior inferior pubic ramus. There appears to be a nondisplaced fracture through the ischial tuberosity. No other  fracture is identified. Both hips are located. No hip joint effusion is seen. Imaged intrapelvic contents are unremarkable.  IMPRESSION: Extensive osseous metastases with progressive lysis and sclerosis in the left distal tuberosity where a nondisplaced fracture is identified.   Electronically Signed   By: Inge Rise M.D.   On: 11/09/2013 14:20   Nm  Pet Image Restag (ps) Skull Base To Thigh  11/27/2013   CLINICAL DATA:  Subsequent Treatment strategy for metastatic breast cancer.  EXAM: NUCLEAR MEDICINE PET SKULL BASE TO THIGH  TECHNIQUE: 9.01 mCi F-18 FDG was injected intravenously. Full-ring PET imaging was performed from the skull base to thigh after the radiotracer. CT data was obtained and used for attenuation correction and anatomic localization.  FASTING BLOOD GLUCOSE:  Value: 88 mg/dl  COMPARISON:  08/12/2013  FINDINGS: NECK  No hypermetabolic lymph nodes in the neck.  CHEST  No hypermetabolic mediastinal or hilar nodes. No suspicious pulmonary nodules on the CT scan. New small bilateral pleural effusions are identified right greater than left. Patchy densities within the lung bases are identified compatible with nonspecific pneumonitis and/or atelectasis.  ABDOMEN/PELVIS  No abnormal hypermetabolic activity within the liver, pancreas, adrenal glands, or spleen. No hypermetabolic lymph nodes in the abdomen or pelvis.  SKELETON  Extensive hypermetabolic adenopathy is identified throughout the visualized axial and appendicular skeleton. There appears to be a relative area of photopenia involving the lower lumbar spine, sacrum and right iliac bone consistent with external beam radiation field. The SUV max within these areas reflect decreased FDG uptake compatible with response to therapy. Index tumor within the L2 vertebral body has an SUV max equal to 5.1. Previously 9.8 Index measurement taken from the right iliac bone has an SUV max equal to 2.4, previously 5.3. Other areas, not within the radiation port do not show a significant interval improvement in the degree of FDG uptake. For example. hypermetabolic tumor involving the right clavicle head measures 6.2. This is compared with 6.3 previously. The SUV max within the left iliac bone is equal to 7.1. Previously 6.4.  IMPRESSION:  1. No acute findings identified. 2. Interval response to therapy within  radiation port involving the lumbar spine, sacrum and right iliac or bone. Areas not included within the radiation port do not show a significant improvement in degree of abnormal hypermetabolism. 3. New small bilateral pleural effusions with lower lobe atelectasis and nonspecific pneumonitis.   Electronically Signed   By: Kerby Moors M.D.   On: 11/27/2013 14:25     ASSESSMENT: 63 y.o. [place] woman   (1)  status post Left breast and Left axillary lymph node biopsy Dec 2010 for a cT3 pN1 (stage IIIA) invasive ductal carcinoma, grade 2, estrogen and progesterone receptor positive, HER-2 negative, with an MIB-1 of 44%;   (2)  treated neoadjuvantly with dose dense cyclophosphamide and doxorubicin x4 followed by weekly paclitaxel x7  (3) s/p bilateral mastectomies May 2011. She had a residual 1.5 cm tumor, and one of 8 sampled lymph nodes was positive [ypT1c ypN1]. Margins were negative.   (4)  She completed post-mastectomy radiation in August of 2011   (5) on letrozole between August 2011 and October 2014  (6) PET scan 11/05/2012 obtained to evaluate right chest wall pain documents bony metastatic disease to the right seventh rib, L1, and the right acetabulum with an associated pathologic fracture through the anterior cortex of the acetabulum measuring 3.2 cm. There was no evidence of extra osseous metastatic disease.  (7) starting 11/05/2012 a received  fulvestrant, 500 mg monthly, in addition to monthly infusions of zoledronic acid. The fulvestrant was discontinued May 2015 with evidence of progression  (8) Right rib, left hip and right hip were all treated to 37.5 Gy in 15 fractions at 2.5 Gy per fraction completed 12/17/2012  (9) 02/19/2013 bone marrow biopsy confirms metastatic disease to bone. HER-2 was not done because of concerns regarding because of fine the specimen. Estrogen and progesterone receptors were read as negative. This i was felt to be possibly discordant.  (10)  patient's tumor carries a PIK3 mutation as documented at [place]  (11) L ribcage pain: s/p palliative XRT completed 06/16/2013  (12) declined participation in a capecitabine plus PIK 3 inhibitor at [facility]; started capecitabine alone 06/27/2013, discontinued after one week because of side effects;  (a) resumed 08/17/2013 at 500 mg twice a day one-week on one-week off  (b) dose being increased gradually, received 1000 mg BID with [name] starting 09/28/2013  (c) full dose of 1527m BID started 10/12/13  (13) radiation to Right sacrum/ lower lumbar spine 06/10 to 06/23    PLAN:  Brooke [name] repeat PET scan is stable area she has only had 2 months of the full dose capecitabine. Given her difficulty tolerating even this treatment, I think the better part of valor in her case is to continue what we are doing and restaging after another 2 months or so. This is very much what she would prefer to do.  We had placed a referral to the pain clinic to see if an epidural shots might further relieve her pain and improve her functional status. We are checking on where we are on that referral.  Otherwise we spent most of today's hour-long visit discussing symptom management. Unfortunately she has not been able to find the narrow range where she would be relatively pain-free and still alert. When she takes additional pain medicine she becomes very groggy and sleepy. Otherwise she lives with her pain at a 5 or 6. This of course is severely limiting her functional status. Brooke [name] heroically adjusted and made house changes which are maximizing the functional status at actually has.  Aside from the pain issue she is doing well on constipation (she knows not to take the mineral asked when she is on the capecitabine). She has had a couple of "accidents", and today we discussed trying to regularize the toilet plan so she will sit on the toilet twice a day even if she does not have an obvious urge.  Hopefully this will prevent nighttime accidents. Her nausea is well controlled on ondansetron plus prochlorperazine. Her hot flashes are not a major issue at this point.  We are going to continue to see her on an every 2 week basis, with lab work and physical exam. She will continue on capecitabine 1500 mg twice daily 7 days on and 7 days off, with the next [name] to start a week from today. She will have her next PET scan towards the end of January and I will see her again in February unremarkable but of course we will reassess as needed in the interim.  Brooke [name] a good understanding of the overall plan. She agrees with it. She knows the goal of treatment in her case is control. She will call with any problems that may develop before her next visit here. MChauncey Cruel MD  11/30/2013

## 2013-12-01 LAB — CANCER ANTIGEN 27.29: CA 27.29: 383 U/mL — ABNORMAL HIGH (ref 0–39)

## 2013-12-01 LAB — CEA: CEA: 93.6 ng/mL — AB (ref 0.0–5.0)

## 2013-12-03 ENCOUNTER — Ambulatory Visit: Payer: Medicare Other | Admitting: Radiation Oncology

## 2013-12-03 NOTE — Addendum Note (Signed)
Addended by: Laureen Abrahams on: 12/03/2013 05:59 PM   Modules accepted: Orders, Medications

## 2013-12-10 ENCOUNTER — Telehealth: Payer: Self-pay | Admitting: Nurse Practitioner

## 2013-12-10 NOTE — Telephone Encounter (Signed)
pt spouse left vm wanting to move appt to earlier date-gave spouse Doren Custard new appt time & date

## 2013-12-11 ENCOUNTER — Ambulatory Visit (HOSPITAL_BASED_OUTPATIENT_CLINIC_OR_DEPARTMENT_OTHER): Payer: Medicare Other | Admitting: Nurse Practitioner

## 2013-12-11 ENCOUNTER — Other Ambulatory Visit (HOSPITAL_BASED_OUTPATIENT_CLINIC_OR_DEPARTMENT_OTHER): Payer: Medicare Other

## 2013-12-11 ENCOUNTER — Encounter: Payer: Self-pay | Admitting: Nurse Practitioner

## 2013-12-11 VITALS — BP 109/63 | HR 84 | Temp 98.4°F | Resp 18 | Ht 64.0 in

## 2013-12-11 DIAGNOSIS — C50512 Malignant neoplasm of lower-outer quadrant of left female breast: Secondary | ICD-10-CM

## 2013-12-11 DIAGNOSIS — C50919 Malignant neoplasm of unspecified site of unspecified female breast: Secondary | ICD-10-CM

## 2013-12-11 DIAGNOSIS — C773 Secondary and unspecified malignant neoplasm of axilla and upper limb lymph nodes: Secondary | ICD-10-CM

## 2013-12-11 DIAGNOSIS — G893 Neoplasm related pain (acute) (chronic): Secondary | ICD-10-CM | POA: Insufficient documentation

## 2013-12-11 DIAGNOSIS — C7951 Secondary malignant neoplasm of bone: Secondary | ICD-10-CM

## 2013-12-11 DIAGNOSIS — Z170 Estrogen receptor positive status [ER+]: Secondary | ICD-10-CM

## 2013-12-11 LAB — CBC WITH DIFFERENTIAL/PLATELET
BASO%: 0.3 % (ref 0.0–2.0)
BASOS ABS: 0 10*3/uL (ref 0.0–0.1)
EOS ABS: 0.2 10*3/uL (ref 0.0–0.5)
EOS%: 4.9 % (ref 0.0–7.0)
HCT: 33.7 % — ABNORMAL LOW (ref 34.8–46.6)
HGB: 11.1 g/dL — ABNORMAL LOW (ref 11.6–15.9)
LYMPH#: 0.6 10*3/uL — AB (ref 0.9–3.3)
LYMPH%: 17.8 % (ref 14.0–49.7)
MCH: 30.9 pg (ref 25.1–34.0)
MCHC: 32.9 g/dL (ref 31.5–36.0)
MCV: 93.9 fL (ref 79.5–101.0)
MONO#: 0.6 10*3/uL (ref 0.1–0.9)
MONO%: 15.8 % — ABNORMAL HIGH (ref 0.0–14.0)
NEUT%: 61.2 % (ref 38.4–76.8)
NEUTROS ABS: 2.1 10*3/uL (ref 1.5–6.5)
Platelets: 138 10*3/uL — ABNORMAL LOW (ref 145–400)
RBC: 3.59 10*6/uL — ABNORMAL LOW (ref 3.70–5.45)
RDW: 15.1 % — AB (ref 11.2–14.5)
WBC: 3.5 10*3/uL — ABNORMAL LOW (ref 3.9–10.3)

## 2013-12-11 LAB — COMPREHENSIVE METABOLIC PANEL (CC13)
ALT: 22 U/L (ref 0–55)
AST: 32 U/L (ref 5–34)
Albumin: 3.2 g/dL — ABNORMAL LOW (ref 3.5–5.0)
Alkaline Phosphatase: 286 U/L — ABNORMAL HIGH (ref 40–150)
Anion Gap: 6 mEq/L (ref 3–11)
BUN: 10.4 mg/dL (ref 7.0–26.0)
CALCIUM: 9.3 mg/dL (ref 8.4–10.4)
CHLORIDE: 106 meq/L (ref 98–109)
CO2: 25 mEq/L (ref 22–29)
Creatinine: 1 mg/dL (ref 0.6–1.1)
GLUCOSE: 86 mg/dL (ref 70–140)
POTASSIUM: 4.6 meq/L (ref 3.5–5.1)
Sodium: 138 mEq/L (ref 136–145)
Total Bilirubin: 0.33 mg/dL (ref 0.20–1.20)
Total Protein: 6.1 g/dL — ABNORMAL LOW (ref 6.4–8.3)

## 2013-12-11 NOTE — Progress Notes (Signed)
ID: Brooke [name]   DOB: Apr 20, 1950  MR#: [id number]  WUJ#:[id number]  PCP: Irven Shelling, MD GYN: SU:  OTHER MD: Thea Silversmith, Gaynelle Arabian, [name] [phone]), Willa Rough DDS  CHIEF COMPLAINT: stage IV breast cancer CURRENT THERAPY: zolendronate, low-dose capecitabine  BREAST CANCER HISTORY: From the January 2011 summary:  Brooke [name] had screening mammography in August 04, 2007 which showed no specific mammographic evidence of malignancy, but she reported a left breast lump.  Accordingly, she was brought back on August 11, 2007 for mammography and ultrasonography.  There was indeed an obscured mass in the upper left breast which by ultrasound appeared to be a simple cyst.    Screening mammogram on August 04, 2008 showed in addition to dense breasts, again a possible mass in the left breast.  She had diagnostic mammography August 06, 2008 and additional views in addition to very dense breasts did not show any persistent mass or distortion.  Furthermore, Dr. Owens Shark was not able to palpate any abnormality in the lateral portion of the left breast.  Ultrasound showed normal appearing fibroglandular tissue in the area in question.    More recently, about September, the patient felt again something like a lump in the left breast and she had some pain associated with this.  This was initially felt simply to be mastalgia and was treated as such, but as it persisted, on December 23rd, the patient had left diagnostic mammography and ultrasonography.  There was a focally dense area in the left breast with very minimal distortion corresponding to the area of palpable abnormality. The ultrasound showed an irregular ill-defined, hypoechoic area with distal shadowing, measuring approximately 1 cm corresponding to the patient's palpable abnormality.    With this information, the patient was brought back for ultrasound-guided core biopsy and this was performed December 28th. 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  There was no evidence of angiolymphatic invasion in the material submitted which measured 1.5 cm maximally.  In addition to the mass in the breast, a lymph node in the left axilla was biopsied and was likewise positive.  The tumor was ER positive at 89%, PR positive at 34% with an elevated MIB-1 at 44% and Her-2 negative with a ratio of 1.13.    Her subsequent history is as detailed below.  INTERVAL HISTORY: Brooke [name] returns for follow up of her metastatic breast cancer, accompanied by her husband, [name]. Today is day 5 cycle 5 of fold dose capecitabine (1557m BID, 7 days on, 7days off). She is usually only able to complete 6.5 of the treatment days, that last dose has "sent her over the edge" before and she has omitted it with a favorable response in the past. She fractured her hip last month and now uses a wheelchair to transfer to the toilet and to bed. Her husband installed a lift chair to carry her up to the 2nd floor of her house, placed a ramp in the garage for the car, and is remodeling their bathroom to accomadate her activity. She is no about 16-277mof dilaudid daily to keep her hip and back pain at a 5/10. Using more of this drug to achieve a lower pain score causes disorientation and dizziness. She wears fentanyl patches as well. She has bowel movements daily, and they are soft and formed. She takes miralax and stool softeners as needed, and reduces their uses on "chemo weeks" which causes her bowel movements to be looser.  REVIEW OF SYSTEMS: Brooke [name] denies fevers, chills. She has mild nausea managed well with zofran and compazine. She has no mouth sores, rashes, unexplained bleeding, or night sweats. Her appetite is improving and she is well hydrated. Her energy level is fair, depending on how much pain medicine she has had that day. She denies shortness of breath, chest pain, cough, or palpitations. A detailed review  of systems is otherwise stable.   PAST MEDICAL HISTORY: Past Medical History  Diagnosis Date  . Hx Breast Cancer, IDC, Stahe III, receptor + 10/20/2010    left breast  . Breast cancer December 2010    invasive ductal and invasive lobular; bilateral mastectomy and radiation  . Mitral valve prolapse   . History of recurrent UTIs   . Radiation 07/26/09-09/08/09    left breast  . Fracture, pelvis closed 11/2012    both hipsand number 7 rib, 15 rounds radiation  . Metastasis to bone   1. Remote migraines, currently inactive.   2. History of prior diagnosis of fibromyalgia made at [facility] and currently not active (she was treated with trimethoprim and nortriptyline for one year for this).    3. History of recurrent UTIs.   4. History of benign left breast cyst aspirated in 2004 at [facility] under Dr. Melene Plan.   5. History of tonsillectomy and adenoidectomy.   6. History of right LASEK surgery. 7. History of bilateral reduction mammoplasties in 1994.   8. History of mitral valve prolapse diagnosed in 1984.  PAST SURGICAL HISTORY: Past Surgical History  Procedure Laterality Date  . Mastectomy Bilateral 2011    left breast cancer  . Lasik    . Reduction mammaplasty Bilateral 1994  . Tonsillectomy and adenoidectomy    . Aspiration of left breast Left 2004    [facility]Dr.Teo  . Bone biopsy  02/19/13    mets from breast cancer-receptors results not back    FAMILY HISTORY Family History  Problem Relation Age of Onset  . Lung cancer Father 80    smoker for 25 years  . Heart disease Maternal Grandmother   . Heart disease Maternal Grandfather   . Diabetes type I Mother   . Diabetes type I Sister   . Diabetes type I Sister   . Heart disease Maternal Uncle   . Uterine cancer Paternal Grandmother 62  . Breast cancer Other 75    paternal great grandmother  [name] died at the age of 38 from lung cancer.  He had been a remote  smoker and had been in [employer] with a question of possible asbestos exposure.  [name] died at the age of 72 in the setting of Alzheimer's, coronary artery disease, diabetes and hypertension.  The patient had two sisters.  One died from complications of diabetes.  The other one is alive with diabetes.    GYNECOLOGIC HISTORY: She is GX P1.  First pregnancy to term at age 42.  The patient's last period was in 2008.  She took hormone replacement for about 18 months until mid-2008.  She had no complications from that treatment.    SOCIAL HISTORY: (Updated October 2014) She used to be a Arts development officer for [employer]  They used to live in the [place] area. 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  Her husband, [name], retired October 2012.  He was a Arts development officer for 28 years.  Their son, Brooke [name], is currently  with the [employer] at the [employer] in [place]. [name].  She attends [employer].   ADVANCED DIRECTIVES: in place  HEALTH MAINTENANCE: (Updated 11/19/2012) History  Substance Use Topics  . Smoking status: Former Smoker -- 1.00 packs/day for 11 years    Quit date: 01/23/1984  . Smokeless tobacco: Never Used  . Alcohol Use: 1.5 - 2.5 oz/week    3-5 drink(s) per week     Comment: glasses of wine     Colonoscopy: 2010  PAP: Jan 2014  Bone density: July 2014, osteopenia  Lipid panel: per Dr Boyce Medici  Allergies  Allergen Reactions  . Sulfa Antibiotics Shortness Of Breath  . Codeine Nausea And Vomiting    Tolerates oxycodone if given with prochlorperazine for nausea  . Morphine And Related Nausea And Vomiting    Per patient possibility that the IR morphine was causing the vomiting   Current outpatient prescriptions: AFLURIA PRESERVATIVE FREE 0.5 ML SUSY, , Disp: , Rfl: ;  ALPRAZolam (XANAX XR) 1 MG 24 hr tablet, Take 1 tablet (1 mg total) by mouth daily., Disp: 90 tablet,  Rfl: 0;  Calcium Citrate (CITRACAL PO), Take 3 tablets by mouth daily., Disp: , Rfl: ;  capecitabine (XELODA) 500 MG tablet, Take 1,500 mg by mouth 2 (two) times daily after a meal., Disp: , Rfl:  cholecalciferol (VITAMIN D) 400 UNITS TABS tablet, Take 400 Units by mouth daily. Patient reportd the Vitamin E should be vitamin D with same dose, Disp: , Rfl: ;  fentaNYL (DURAGESIC - DOSED MCG/HR) 25 MCG/HR patch, Place 1 patch (25 mcg total) onto the skin every 3 (three) days., Disp: 10 patch, Rfl: 0;  gabapentin (NEURONTIN) 300 MG capsule, Take 300 mg by mouth at bedtime., Disp: , Rfl:  HYDROmorphone (DILAUDID) 4 MG tablet, Take 1-2 tablets (4-8 mg total) by mouth every 4 (four) hours as needed for severe pain (pain)., Disp: 120 tablet, Rfl: 0;  ondansetron (ZOFRAN) 8 MG tablet, Take 8 mg by mouth 2 (two) times daily as needed for nausea or vomiting (nausea & vomiting)., Disp: , Rfl: ;  Polyethyl Glycol-Propyl Glycol (SYSTANE) 0.4-0.3 % SOLN, Apply 1 drop to eye 2 (two) times daily as needed (dry eyes)., Disp: , Rfl:  Polyethylene Glycol 3350 (MIRALAX PO), Take 30 mLs by mouth daily. , Disp: , Rfl: ;  prochlorperazine (COMPAZINE) 5 MG tablet, Take 1 tablet (5 mg total) by mouth every 6 (six) hours as needed for nausea or vomiting., Disp: 120 tablet, Rfl: 2;  venlafaxine XR (EFFEXOR-XR) 75 MG 24 hr capsule, Take 2 capsules (150 mg total) by mouth daily with breakfast., Disp: 180 capsule, Rfl: 1 Zoledronic Acid (ZOMETA IV), Inject into the vein. Every 90 Days., Disp: , Rfl: ;  diphenhydrAMINE (BENADRYL) 25 mg capsule, Take 25 mg by mouth at bedtime as needed for sleep (sleep). , Disp: , Rfl:   OBJECTIVE: Middle-aged white woman examined in a wheelchair  Filed Vitals:   12/11/13 1344  BP: 109/63  Pulse: 84  Temp: 98.4 F (36.9 C)  Resp: 18   ECOG 2  Skin: warm, dry  HEENT: sclerae anicteric, conjunctivae pink, oropharynx clear. No thrush or mucositis.  Lymph Nodes: No cervical or supraclavicular  lymphadenopathy  Lungs: clear to auscultation bilaterally, no rales, wheezes, or rhonci  Heart: regular rate and rhythm  Abdomen: round, soft, non tender, positive bowel sounds  Musculoskeletal: mild spinal tenderness, no peripheral edema  Neuro: non focal, well oriented, positive  affect  Breasts: deferred  LAB RESULTS: Results for Brooke, [name] (MRN [id number]) as of 11/30/2013 14:15  Ref. Range 08/17/2013 13:17 08/28/2013 08:53 09/24/2013 14:15 10/12/2013 13:29 10/30/2013 14:31  CA 27.29 Latest Range: 0-39 U/mL 449 (H) 394 (H) 363 (H) 342 (H) 337 (H)  CEA Latest Range: 0.0-5.0 ng/mL 148.1 (H) 134.0 (H) 90.4 (H)  85.5 (H)    Lab Results  Component Value Date   WBC 3.5* 12/11/2013   NEUTROABS 2.1 12/11/2013   HGB 11.1* 12/11/2013   HCT 33.7* 12/11/2013   MCV 93.9 12/11/2013   PLT 138* 12/11/2013      Chemistry      Component Value Date/Time   NA 138 12/11/2013 1321   NA 141 03/29/2013 0350   NA 142 06/26/2011 0728   K 4.6 12/11/2013 1321   K 3.7 03/29/2013 0350   K 4.6 06/26/2011 0728   CL 111 03/29/2013 0350   CL 105 01/07/2012 1347   CL 102 06/26/2011 0728   CO2 25 12/11/2013 1321   CO2 18* 03/29/2013 0350   CO2 30 06/26/2011 0728   BUN 10.4 12/11/2013 1321   BUN 10 03/29/2013 0350   BUN 12 06/26/2011 0728   CREATININE 1.0 12/11/2013 1321   CREATININE 0.72 03/29/2013 0350   CREATININE 1.1 06/26/2011 0728      Component Value Date/Time   CALCIUM 9.3 12/11/2013 1321   CALCIUM 7.8* 03/29/2013 0350   CALCIUM 9.1 06/26/2011 0728   ALKPHOS 286* 12/11/2013 1321   ALKPHOS 91 04/30/2013 1301   AST 32 12/11/2013 1321   AST 67* 04/30/2013 1301   ALT 22 12/11/2013 1321   ALT 93* 04/30/2013 1301   BILITOT 0.33 12/11/2013 1321   BILITOT 0.4 04/30/2013 1301       Lab Results  Component Value Date   LABCA2 383* 11/30/2013    STUDIES: Nm Pet Image Restag (ps) Skull Base To Thigh  11/27/2013   CLINICAL DATA:  Subsequent Treatment strategy for metastatic breast  cancer.  EXAM: NUCLEAR MEDICINE PET SKULL BASE TO THIGH  TECHNIQUE: 9.01 mCi F-18 FDG was injected intravenously. Full-ring PET imaging was performed from the skull base to thigh after the radiotracer. CT data was obtained and used for attenuation correction and anatomic localization.  FASTING BLOOD GLUCOSE:  Value: 88 mg/dl  COMPARISON:  08/12/2013  FINDINGS: NECK  No hypermetabolic lymph nodes in the neck.  CHEST  No hypermetabolic mediastinal or hilar nodes. No suspicious pulmonary nodules on the CT scan. New small bilateral pleural effusions are identified right greater than left. Patchy densities within the lung bases are identified compatible with nonspecific pneumonitis and/or atelectasis.  ABDOMEN/PELVIS  No abnormal hypermetabolic activity within the liver, pancreas, adrenal glands, or spleen. No hypermetabolic lymph nodes in the abdomen or pelvis.  SKELETON  Extensive hypermetabolic adenopathy is identified throughout the visualized axial and appendicular skeleton. There appears to be a relative area of photopenia involving the lower lumbar spine, sacrum and right iliac bone consistent with external beam radiation field. The SUV max within these areas reflect decreased FDG uptake compatible with response to therapy. Index tumor within the L2 vertebral body has an SUV max equal to 5.1. Previously 9.8 Index measurement taken from the right iliac bone has an SUV max equal to 2.4, previously 5.3. Other areas, not within the radiation port do not show a significant interval improvement in the degree of FDG uptake. For example. hypermetabolic tumor involving the right clavicle head measures 6.2. This is compared with 6.3  previously. The SUV max within the left iliac bone is equal to 7.1. Previously 6.4.  IMPRESSION:  1. No acute findings identified. 2. Interval response to therapy within radiation port involving the lumbar spine, sacrum and right iliac or bone. Areas not included within the radiation port do not  show a significant improvement in degree of abnormal hypermetabolism. 3. New small bilateral pleural effusions with lower lobe atelectasis and nonspecific pneumonitis.   Electronically Signed   By: Kerby Moors M.D.   On: 11/27/2013 14:25     ASSESSMENT: 63 y.o. [place] woman   (1)  status post Left breast and Left axillary lymph node biopsy Dec 2010 for a cT3 pN1 (stage IIIA) invasive ductal carcinoma, grade 2, estrogen and progesterone receptor positive, HER-2 negative, with an MIB-1 of 44%;   (2)  treated neoadjuvantly with dose dense cyclophosphamide and doxorubicin x4 followed by weekly paclitaxel x7  (3) s/p bilateral mastectomies May 2011. She had a residual 1.5 cm tumor, and one of 8 sampled lymph nodes was positive [ypT1c ypN1]. Margins were negative.   (4)  She completed post-mastectomy radiation in August of 2011   (5) on letrozole between August 2011 and October 2014  (6) PET scan 11/05/2012 obtained to evaluate right chest wall pain documents bony metastatic disease to the right seventh rib, L1, and the right acetabulum with an associated pathologic fracture through the anterior cortex of the acetabulum measuring 3.2 cm. There was no evidence of extra osseous metastatic disease.  (7) starting 11/05/2012 a received fulvestrant, 500 mg monthly, in addition to monthly infusions of zoledronic acid. The fulvestrant was discontinued May 2015 with evidence of progression  (8) Right rib, left hip and right hip were all treated to 37.5 Gy in 15 fractions at 2.5 Gy per fraction completed 12/17/2012  (9) 02/19/2013 bone marrow biopsy confirms metastatic disease to bone. HER-2 was not done because of concerns regarding because of fine the specimen. Estrogen and progesterone receptors were read as negative. This i was felt to be possibly discordant.  (10) patient's tumor carries a PIK3 mutation as documented at [facility]  (11) L ribcage pain: s/p palliative  XRT completed 06/16/2013  (12) declined participation in a capecitabine plus PIK 3 inhibitor at [facility]; started capecitabine alone 06/27/2013, discontinued after one week because of side effects;  (a) resumed 08/17/2013 at 500 mg twice a day one-week on one-week off  (b) dose being increased gradually, received 1000 mg BID with cycle starting 09/28/2013  (c) full dose of 1524m BID started 10/12/13  (13) radiation to Right sacrum/ lower lumbar spine 06/10 to 06/23    PLAN:  PFraser Dinis in good spirits today. The labs were reviewed in detail and were relatively stable. She will continue on the same capecitabine regimen, omitting a dose towards the end of the week as necessary. She is not in need of any prescription refills today.   PFraser Dinwill continue to visit every 2 weeks. She will receive zometa monthly, next due on 12/7. Her next PET scan will be around the end of January. She understands and agrees with this plan. She knows the goal of treatment in her case is control. She has been encouraged to call with any issues that arise before her next visit here.  FMarcelino Duster NP  12/11/2013

## 2013-12-14 ENCOUNTER — Other Ambulatory Visit: Payer: Medicare Other

## 2013-12-14 ENCOUNTER — Ambulatory Visit: Payer: Medicare Other | Admitting: Nurse Practitioner

## 2013-12-18 ENCOUNTER — Other Ambulatory Visit: Payer: Self-pay | Admitting: *Deleted

## 2013-12-19 ENCOUNTER — Other Ambulatory Visit: Payer: Self-pay | Admitting: Oncology

## 2013-12-20 ENCOUNTER — Emergency Department (HOSPITAL_COMMUNITY)
Admission: EM | Admit: 2013-12-20 | Discharge: 2013-12-20 | Disposition: A | Discharge status: Home / Self Care | Payer: Medicare Other | Attending: Emergency Medicine | Admitting: Emergency Medicine

## 2013-12-20 ENCOUNTER — Emergency Department (HOSPITAL_COMMUNITY): Payer: Medicare Other

## 2013-12-20 ENCOUNTER — Encounter (HOSPITAL_COMMUNITY): Payer: Self-pay | Admitting: Oncology

## 2013-12-20 DIAGNOSIS — Z79899 Other long term (current) drug therapy: Secondary | ICD-10-CM | POA: Diagnosis not present

## 2013-12-20 DIAGNOSIS — S199XXA Unspecified injury of neck, initial encounter: Secondary | ICD-10-CM | POA: Diagnosis not present

## 2013-12-20 DIAGNOSIS — S42291A Other displaced fracture of upper end of right humerus, initial encounter for closed fracture: Secondary | ICD-10-CM | POA: Insufficient documentation

## 2013-12-20 DIAGNOSIS — S4991XA Unspecified injury of right shoulder and upper arm, initial encounter: Secondary | ICD-10-CM | POA: Diagnosis present

## 2013-12-20 DIAGNOSIS — Z853 Personal history of malignant neoplasm of breast: Secondary | ICD-10-CM | POA: Diagnosis not present

## 2013-12-20 DIAGNOSIS — Z923 Personal history of irradiation: Secondary | ICD-10-CM | POA: Diagnosis not present

## 2013-12-20 DIAGNOSIS — C7951 Secondary malignant neoplasm of bone: Secondary | ICD-10-CM | POA: Diagnosis not present

## 2013-12-20 DIAGNOSIS — W1839XA Other fall on same level, initial encounter: Secondary | ICD-10-CM | POA: Insufficient documentation

## 2013-12-20 DIAGNOSIS — Y93E1 Activity, personal bathing and showering: Secondary | ICD-10-CM | POA: Diagnosis not present

## 2013-12-20 DIAGNOSIS — S42301A Unspecified fracture of shaft of humerus, right arm, initial encounter for closed fracture: Secondary | ICD-10-CM

## 2013-12-20 DIAGNOSIS — Y998 Other external cause status: Secondary | ICD-10-CM | POA: Insufficient documentation

## 2013-12-20 DIAGNOSIS — Z87440 Personal history of urinary (tract) infections: Secondary | ICD-10-CM | POA: Insufficient documentation

## 2013-12-20 DIAGNOSIS — Z87891 Personal history of nicotine dependence: Secondary | ICD-10-CM | POA: Diagnosis not present

## 2013-12-20 DIAGNOSIS — Y92192 Bathroom in other specified residential institution as the place of occurrence of the external cause: Secondary | ICD-10-CM | POA: Diagnosis not present

## 2013-12-20 DIAGNOSIS — Z8679 Personal history of other diseases of the circulatory system: Secondary | ICD-10-CM | POA: Diagnosis not present

## 2013-12-20 MED ORDER — HYDROMORPHONE HCL 2 MG/ML IJ SOLN
4.0000 mg | Freq: Once | INTRAMUSCULAR | Status: AC
  Administered 2013-12-20: 4 mg via INTRAMUSCULAR
  Filled 2013-12-20: qty 2

## 2013-12-20 NOTE — ED Notes (Signed)
Pt was holding on to the counter in the bathroom when she fell on her right side into the shower then fell to the floor.  Pt denies LOC.  C/O pain 10/10 throbbing in nature in her right arm.  Pt has a hx of breat ca w/ mets to bone.

## 2013-12-20 NOTE — ED Notes (Signed)
She is comfortable enough now to go to x-ray; and is taken there via stretcher at this time.  She is awake, drowsy and oriented x 4.  Her husband remains with her.

## 2013-12-20 NOTE — ED Provider Notes (Signed)
CSN: [id number]     Arrival date & time 12/20/13  6962 History   First MD Initiated Contact with Patient 12/20/13 [phone]     Chief Complaint  Patient presents with  . 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  She reports falling in her bathroom this morning when trying to close the shower door.  She reports losing her balance and falling on her R shoulder.  Since that time, she reports sharp pain with movement of her R shoulder and arm.  She denies losing consciousness or feeling light-headed prior to the fall.  She says that she may have hit her head, but she denies head pain or neck pain currently.  She is on high dose opioids at home, currently using a fentanyl 25 mcg/hr patch and taking 20 mg Dilaudid PO daily divided into 5 doses.  She reports taking 6 mg Dilaudid PO prior to arrival without relief of her pain.  She denies fevers, chills, nausea, vomiting, or shortness of breath.  A recent PET scan showed extensive bone metabolic activity, including a foci in her R clavicle.   Past Medical History  Diagnosis Date  . Hx Breast Cancer, IDC, Stahe III, receptor + 10/20/2010    left breast  . Breast cancer December 2010    invasive ductal and invasive lobular; bilateral mastectomy and radiation  . Mitral valve prolapse   . History of recurrent UTIs   . Radiation 07/26/09-09/08/09    left breast  . Fracture, pelvis closed 11/2012    both hipsand number 7 rib, 15 rounds radiation  . Metastasis to bone    Past Surgical History  Procedure Laterality Date  . Mastectomy Bilateral 2011    left breast cancer  . Lasik    . Reduction mammaplasty Bilateral 1994  . Tonsillectomy and adenoidectomy    . Aspiration of left breast Left 2004    [facility]/Dr.Teo  . Bone biopsy  02/19/13    mets from breast cancer-receptors results not back   Family  History  Problem Relation Age of Onset  . Lung cancer Father 84    smoker for 25 years  . Heart disease Maternal Grandmother   . Heart disease Maternal Grandfather   . Diabetes type I Mother   . Diabetes type I Sister   . Diabetes type I Sister   . Heart disease Maternal Uncle   . Uterine cancer Paternal Grandmother 20  . Breast cancer Other 91    paternal great grandmother   History  Substance Use Topics  . Smoking status: Former Smoker -- 1.00 packs/day for 11 years    Quit date: 01/23/1984  . Smokeless tobacco: Never Used  . Alcohol Use: No   OB History    Gravida Para Term Preterm AB TAB SAB Ectopic Multiple Living   3 1 1       1      Review of Systems  Constitutional: Positive for fatigue. Negative for fever, chills and diaphoresis.  HENT: Negative for congestion, rhinorrhea and sore throat.   Respiratory: Negative for cough, chest tightness and shortness of breath.   Cardiovascular: Negative for chest pain.  Gastrointestinal: Negative for nausea, vomiting, abdominal pain, diarrhea and constipation.  Genitourinary: Negative for dysuria and difficulty urinating.  Musculoskeletal: Positive for myalgias and arthralgias.  Skin: Negative for rash.  Neurological: Negative for dizziness,  weakness, light-headedness and numbness.      Allergies  Sulfa antibiotics; Codeine; and Morphine and related  Home Medications   Prior to Admission medications   Medication Sig Start Date End Date Taking? Authorizing Provider  ALPRAZolam (XANAX XR) 1 MG 24 hr tablet Take 1 tablet (1 mg total) by mouth daily. 11/13/13  Yes Chauncey Cruel, MD  Calcium Citrate (CITRACAL PO) Take 3 tablets by mouth daily.   Yes Historical Provider, MD  cholecalciferol (VITAMIN D) 400 UNITS TABS tablet Take 400 Units by mouth daily. Patient reportd the Vitamin E should be vitamin D with same dose   Yes Self Requested  diphenhydrAMINE (BENADRYL) 25 mg capsule Take 25 mg by mouth at bedtime as needed for  sleep (sleep).    Yes Historical Provider, MD  fentaNYL (DURAGESIC - DOSED MCG/HR) 25 MCG/HR patch Place 1 patch (25 mcg total) onto the skin every 3 (three) days. 10/22/13  Yes Marcelino Duster, NP  gabapentin (NEURONTIN) 300 MG capsule TAKE 1 CAPSULE (300 MG TOTAL) BY MOUTH AT BEDTIME. 12/19/13  Yes Chauncey Cruel, MD  HYDROmorphone (DILAUDID) 4 MG tablet Take 1-2 tablets (4-8 mg total) by mouth every 4 (four) hours as needed for severe pain (pain). 11/13/13  Yes Rulon Eisenmenger, MD  ondansetron (ZOFRAN) 8 MG tablet Take 8 mg by mouth 2 (two) times daily as needed for nausea or vomiting (nausea & vomiting).   Yes Historical Provider, MD  Polyethyl Glycol-Propyl Glycol (SYSTANE) 0.4-0.3 % SOLN Apply 1 drop to eye 2 (two) times daily as needed (dry eyes).   Yes Historical Provider, MD  polyethylene glycol (MIRALAX / GLYCOLAX) packet Take 17 g by mouth daily as needed for mild constipation.   Yes Historical Provider, MD  [place]   Yes Historical Provider, MD  prochlorperazine (COMPAZINE) 5 MG tablet Take 1 tablet (5 mg total) by mouth every 6 (six) hours as needed for nausea or vomiting. 11/13/13  Yes Chauncey Cruel, MD  venlafaxine XR (EFFEXOR-XR) 75 MG 24 hr capsule Take 2 capsules (150 mg total) by mouth daily with breakfast. 07/27/13  Yes Chauncey Cruel, MD  capecitabine (XELODA) 500 MG tablet Take 1,500 mg by mouth See admin instructions. Take twice daily after meals for 7 days then stop for 7 days    Historical Provider, MD   BP 160/93 mmHg  Pulse 100  Temp(Src) 98.1 F (36.7 C) (Oral)  Resp 18  Ht 5\' 4"  (1.626 m)  Wt 172 lb (78.019 kg)  BMI 29.51 kg/m2  SpO2 94%  LMP 01/23/2007 Physical Exam  Constitutional: She is oriented to person, place, and time. She appears well-developed and well-nourished. She appears distressed (In pain.).  HENT:  Head: Normocephalic and atraumatic.  Mouth/Throat: No oropharyngeal exudate.  Eyes: Conjunctivae and EOM are  normal. Pupils are equal, round, and reactive to light. No scleral icterus.  Neck: Normal range of motion.  Tenderness in posterior neck.  Cardiovascular: Normal rate, regular rhythm and normal heart sounds.   Pulmonary/Chest: Effort normal and breath sounds normal. No respiratory distress.  Abdominal: Soft. Bowel sounds are normal. She exhibits no distension. There is no tenderness.  Musculoskeletal: She exhibits tenderness (Palpation along R clavicle and humerous.).  No obvious deformity, R shoulder and arm movement limited by pain.  Neurological: She is alert and oriented to person, place, and time. No cranial nerve deficit. She exhibits normal muscle tone.  Skin: Skin is warm and dry. No rash noted. No erythema.  ED Course  Procedures (including critical care time) Labs Review Labs Reviewed - No data to display  Imaging Review Dg Clavicle Right  12/20/2013   CLINICAL DATA:  [place] in bathroom with right shoulder pain. Right clavicle pain.  EXAM: RIGHT CLAVICLE - 2+ VIEWS  COMPARISON:  Right shoulder 12/20/2013  FINDINGS: The right clavicle is intact. There is a minimally displaced fracture involving the proximal right humerus, best seen on the right humerus images. No evidence for a right pneumothorax. There is mild sclerosis in the proximal right humerus. Patient has history of breast cancer and known bone lesions. Difficult to exclude a pathologic fracture in the right humeral neck. There is also irregular sclerosis involving the lateral right scapula. Evidence for old right rib fractures.  IMPRESSION: Minimally displaced fracture involving the right humeral neck. There is mild sclerosis in the proximal right humerus and this could be a pathologic fracture.   Electronically Signed   By: Markus Daft M.D.   On: 12/20/2013 09:05   Dg Shoulder Right  12/20/2013   CLINICAL DATA:  Fall, right shoulder pain  EXAM: RIGHT SHOULDER - 2+ VIEW  COMPARISON:  None.  FINDINGS: Suspected nondisplaced  right humeral neck fracture.  Underlying sclerosis involving the proximal humerus raises the possibility of pathologic fracture.  The joint spaces are preserved.  Visualized right lung is clear.  IMPRESSION: Suspected nondisplaced right humeral neck fracture.  Underlying sclerosis involving the proximal humerus raises the possibility of pathologic fracture.   Electronically Signed   By: Julian Hy M.D.   On: 12/20/2013 08:56   Ct Cervical Spine Wo Contrast  12/20/2013   CLINICAL DATA:  Right sided injury s/p fall today.Nurse note: Pt was holding on to the counter in the bathroom when she fell on her right side into the shower then fell to the floor. Pt denies LOC. C/O pain 10/10 throbbing in nature in her right arm. Pt has a hx of breast ca w/ mets to bone.  EXAM: CT CERVICAL SPINE WITHOUT CONTRAST  TECHNIQUE: Multidetector CT imaging of the cervical spine was performed without intravenous contrast. Multiplanar CT image reconstructions were also generated.  COMPARISON:  If is with  FINDINGS: Extensive mixed lytic and sclerotic lesions involving all levels. No significant compression deformity. Subacute fracture of the C7 transverse process with some healing response, no definite solid bone bridging. This was visible on prior study. Straightening of the normal cervical lordosis. Mild narrowing of interspaces C4-5, C5-6, C6-7 with small anterior and posterior endplate spurs. Regional soft tissues unremarkable. Visualized lung apices clear.  IMPRESSION: 1. Extensive metastatic disease without acute bone abnormality. 2. Stable positioning of subacute C7 spinous process fracture fragments.   Electronically Signed   By: Arne Cleveland M.D.   On: 12/20/2013 11:14   Dg Humerus Right  12/20/2013   ADDENDUM REPORT: 12/20/2013 09:10  ADDENDUM: Patient has history of breast cancer. There is mild sclerosis in the proximal right humerus. There is mild cortical irregularity in the mid right humerus and this could  represent metastatic disease. Difficult to exclude a subtle injury or lesion at this location.  Impression: Mild sclerosis in the proximal right humerus is concerning for metastatic disease. Along with the humeral neck fracture, cannot exclude a subtle injury or lesion in the mid humeral shaft.   Electronically Signed   By: Markus Daft M.D.   On: 12/20/2013 09:10   12/20/2013   CLINICAL DATA:  [place] in bathroom and landed on right side. Right shoulder pain.  EXAM: RIGHT HUMERUS - 2+ VIEW  COMPARISON:  12/20/2013  FINDINGS: There is a mildly displaced fracture involving the proximal right humerus. The mid and distal humerus are intact. Right shoulder appears to be located. The right AC joint is intact. No evidence for a large right pneumothorax.  IMPRESSION: Mildly displaced fracture involving the proximal right humerus. Fracture is likely involving the surgical neck.  Electronically Signed: By: Markus Daft M.D. On: 12/20/2013 08:56     EKG Interpretation None      MDM   Final diagnoses:  Humerus fracture, right, closed, initial encounter   Concern for injury to R arm and/or shoulder.  Not on blood thinners and no evidence of trauma to head.  IV access limited due to preserving left arm for chemotherapy and left arm injury.  Will treat pain with IM Dilaudid and x-ray R humerus, shoulder, and clavicle.  Due to persistent pain in neck, will do CT of cervical spine.  Closed fracture of right humerus (possibly pathologic) along with subacute fracture of C7 spinous process.  Gave sling for right arm and told to follow up with oncologist for referral to orthopedics because pathologic fracture may need to be fixed to heal properly.  She would prefer that her oncologist makes the referral.  She has Dilaudid and fentanyl at home that she can use to treat her pain.  No management necessary for spinous process fracture, and she is unsure when she may have injured her neck remotely.  Arman Filter, MD 12/21/13  Bartholomew, MD 12/22/13 [phone]

## 2013-12-20 NOTE — Discharge Instructions (Signed)
-  Your arm pain is due to a fracture of your right humerus, which is the bone in your upper arm.  This may have been due to cancer that has grown in that bone. -You also have an old fracture of the bone in the back of your neck, which does not require additional treatment at this time. -For your arm, it is important to keep it immobilized so your fracture can heal.  Wear your sling at all times to help keep it still. -Because there is evidence of cancer near your fracture, you should follow up with orthopedics to determine if additional treatment is necessary. -Please follow up with your oncologist as soon as possible, so they can refer you to an orthopedic doctor. -Continue to use your home Fentanyl and Dilaudid as prescribed to help with the pain.  Humerus Fracture, Treated with Immobilization The humerus is the large bone in your upper arm. You have a broken (fractured) humerus. These fractures are easily diagnosed with X-rays. TREATMENT  Simple fractures which will heal without disability are treated with simple immobilization. Immobilization means you will wear a cast, splint, or sling. You have a fracture which will do well with immobilization. The fracture will heal well simply by being held in a good position until it is stable enough to begin range of motion exercises. Do not take part in activities which would further injure your arm.  HOME CARE INSTRUCTIONS   Put ice on the injured area.  Put ice in a plastic bag.  Place a towel between your skin and the bag.  Leave the ice on for 15-20 minutes, 03-04 times a day.  If you have a cast:  Do not scratch the skin under the cast using sharp or pointed objects.  Check the skin around the cast every day. You may put lotion on any red or sore areas.  Keep your cast dry and clean.  If you have a splint:  Wear the splint as directed.  Keep your splint dry and clean.  You may loosen the elastic around the splint if your fingers  become numb, tingle, or turn cold or blue.  If you have a sling:  Wear the sling as directed.  Do not put pressure on any part of your cast or splint until it is fully hardened.  Your cast or splint can be protected during bathing with a plastic bag. Do not lower the cast or splint into water.  Only take over-the-counter or prescription medicines for pain, discomfort, or fever as directed by your caregiver.  Do range of motion exercises as instructed by your caregiver.  Follow up as directed by your caregiver. This is very important in order to avoid permanent injury or disability and chronic pain. SEEK IMMEDIATE MEDICAL CARE IF:   Your skin or nails in the injured arm turn blue or gray.  Your arm feels cold or numb.  You develop severe pain in the injured arm.  You are having problems with the medicines you were given. MAKE SURE YOU:   Understand these instructions.  Will watch your condition.  Will get help right away if you are not doing well or get worse. Document Released: 04/16/2000 Document Revised: 04/02/2011 Document Reviewed: 02/22/2010 [facility] Patient Information 2015 [place], [place]. This information is not intended to replace advice given to you by your health care provider. Make sure you discuss any questions you have with your health care provider.

## 2013-12-21 ENCOUNTER — Other Ambulatory Visit: Payer: Self-pay | Admitting: Oncology

## 2013-12-21 ENCOUNTER — Telehealth: Payer: Self-pay | Admitting: Oncology

## 2013-12-21 NOTE — Progress Notes (Unsigned)
Pat fell and fractured her right humerus. This is not displaced. She was evaluated in the emergency room where they obtain also neck and shoulder films. In the neck there is a subacute fracture which shows some healing. She was given a sling and told that generally a cast is not used in this setting.  I called her today and reinforced that. Because this is not displaced, hopefully it will heal simply with a sling. However if there is tumor there it will not heal. There is a little bit of sclerosis at the sites of possibly this could be a pathologic fracture. I has failed to call me back in a couple of days. If the pain is not well-controlled we will consider some radiation to that area.  We are liberalizing the dialogue metastases and she may take 4-8 mg every 4 hours as needed. They have a very good bowel prophylaxis regimen in place. Otherwise she will see me again a week from now we will continue the capecitabine and zolendronate as before.

## 2013-12-21 NOTE — Telephone Encounter (Signed)
, °

## 2013-12-22 ENCOUNTER — Telehealth: Payer: Self-pay | Admitting: *Deleted

## 2013-12-22 NOTE — Telephone Encounter (Signed)
Patient request that her appt for 12/7 be moved up. I have called and told them that is the first available.

## 2013-12-24 ENCOUNTER — Encounter: Payer: Self-pay | Admitting: Nutrition

## 2013-12-24 NOTE — Progress Notes (Signed)
Patient identified to be at risk for malnutrition on the MST. Contacted patient on mobile.  Husband answered and requested I call patient back tomorrow.   Also asked if I would text my name and number to his mobile. Used a [employer] phone to text name and work number for patient.

## 2013-12-28 ENCOUNTER — Ambulatory Visit (HOSPITAL_BASED_OUTPATIENT_CLINIC_OR_DEPARTMENT_OTHER): Payer: Medicare Other | Admitting: Oncology

## 2013-12-28 ENCOUNTER — Other Ambulatory Visit (HOSPITAL_BASED_OUTPATIENT_CLINIC_OR_DEPARTMENT_OTHER): Payer: Medicare Other

## 2013-12-28 ENCOUNTER — Ambulatory Visit: Payer: Medicare Other

## 2013-12-28 ENCOUNTER — Ambulatory Visit (HOSPITAL_BASED_OUTPATIENT_CLINIC_OR_DEPARTMENT_OTHER): Payer: Medicare Other

## 2013-12-28 ENCOUNTER — Ambulatory Visit: Payer: Medicare Other | Admitting: Nurse Practitioner

## 2013-12-28 ENCOUNTER — Other Ambulatory Visit: Payer: Medicare Other

## 2013-12-28 ENCOUNTER — Telehealth: Payer: Self-pay | Admitting: Oncology

## 2013-12-28 VITALS — BP 165/89 | HR 78 | Temp 98.0°F | Resp 18 | Ht 64.0 in | Wt 186.4 lb

## 2013-12-28 DIAGNOSIS — M79603 Pain in arm, unspecified: Secondary | ICD-10-CM

## 2013-12-28 DIAGNOSIS — Z170 Estrogen receptor positive status [ER+]: Secondary | ICD-10-CM

## 2013-12-28 DIAGNOSIS — R0781 Pleurodynia: Secondary | ICD-10-CM

## 2013-12-28 DIAGNOSIS — C7951 Secondary malignant neoplasm of bone: Secondary | ICD-10-CM

## 2013-12-28 DIAGNOSIS — C50512 Malignant neoplasm of lower-outer quadrant of left female breast: Secondary | ICD-10-CM

## 2013-12-28 DIAGNOSIS — M25551 Pain in right hip: Secondary | ICD-10-CM

## 2013-12-28 DIAGNOSIS — C50919 Malignant neoplasm of unspecified site of unspecified female breast: Secondary | ICD-10-CM

## 2013-12-28 DIAGNOSIS — M79602 Pain in left arm: Secondary | ICD-10-CM

## 2013-12-28 DIAGNOSIS — C50911 Malignant neoplasm of unspecified site of right female breast: Secondary | ICD-10-CM

## 2013-12-28 LAB — CBC WITH DIFFERENTIAL/PLATELET
BASO%: 0.5 % (ref 0.0–2.0)
BASOS ABS: 0 10*3/uL (ref 0.0–0.1)
EOS%: 4 % (ref 0.0–7.0)
Eosinophils Absolute: 0.2 10*3/uL (ref 0.0–0.5)
HEMATOCRIT: 34.4 % — AB (ref 34.8–46.6)
HGB: 11.2 g/dL — ABNORMAL LOW (ref 11.6–15.9)
LYMPH%: 14.6 % (ref 14.0–49.7)
MCH: 31.1 pg (ref 25.1–34.0)
MCHC: 32.6 g/dL (ref 31.5–36.0)
MCV: 95.5 fL (ref 79.5–101.0)
MONO#: 0.5 10*3/uL (ref 0.1–0.9)
MONO%: 11.8 % (ref 0.0–14.0)
NEUT#: 3.1 10*3/uL (ref 1.5–6.5)
NEUT%: 69.1 % (ref 38.4–76.8)
Platelets: 195 10*3/uL (ref 145–400)
RBC: 3.6 10*6/uL — ABNORMAL LOW (ref 3.70–5.45)
RDW: 17.1 % — AB (ref 11.2–14.5)
WBC: 4.5 10*3/uL (ref 3.9–10.3)
lymph#: 0.7 10*3/uL — ABNORMAL LOW (ref 0.9–3.3)

## 2013-12-28 LAB — COMPREHENSIVE METABOLIC PANEL (CC13)
ALT: 17 U/L (ref 0–55)
ANION GAP: 10 meq/L (ref 3–11)
AST: 28 U/L (ref 5–34)
Albumin: 3.1 g/dL — ABNORMAL LOW (ref 3.5–5.0)
Alkaline Phosphatase: 238 U/L — ABNORMAL HIGH (ref 40–150)
BUN: 13.6 mg/dL (ref 7.0–26.0)
CALCIUM: 9.3 mg/dL (ref 8.4–10.4)
CO2: 22 meq/L (ref 22–29)
CREATININE: 0.9 mg/dL (ref 0.6–1.1)
Chloride: 105 mEq/L (ref 98–109)
EGFR: 74 mL/min/{1.73_m2} — AB (ref >90)
GLUCOSE: 122 mg/dL (ref 70–140)
Potassium: 4.3 mEq/L (ref 3.5–5.1)
Sodium: 137 mEq/L (ref 136–145)
Total Bilirubin: 0.36 mg/dL (ref 0.20–1.20)
Total Protein: 6.1 g/dL — ABNORMAL LOW (ref 6.4–8.3)

## 2013-12-28 MED ORDER — DENOSUMAB 120 MG/1.7ML ~~LOC~~ SOLN
120.0000 mg | Freq: Once | SUBCUTANEOUS | Status: AC
  Administered 2013-12-28: 120 mg via SUBCUTANEOUS
  Filled 2013-12-28: qty 1.7

## 2013-12-28 NOTE — Telephone Encounter (Signed)
per GM to add inj on to pt sch-he will CX inf-added inj

## 2013-12-28 NOTE — Progress Notes (Signed)
ID: Brooke [name]   DOB: 1950-08-04  MR#: [id number]  OZY#:[id number]  PCP: Irven Shelling, MD GYN: SU:  OTHER MD: Thea Silversmith, Gaynelle Arabian, [name] [phone]), Willa Rough DDS  CHIEF COMPLAINT: stage IV breast cancer CURRENT THERAPY: zolendronate, capecitabine  BREAST CANCER HISTORY: From the January 2011 summary:  Brooke [name] had screening mammography in August 04, 2007 which showed no specific mammographic evidence of malignancy, but she reported a left breast lump.  Accordingly, she was brought back on August 11, 2007 for mammography and ultrasonography.  There was indeed an obscured mass in the upper left breast which by ultrasound appeared to be a simple cyst.    Screening mammogram on August 04, 2008 showed in addition to dense breasts, again a possible mass in the left breast.  She had diagnostic mammography August 06, 2008 and additional views in addition to very dense breasts did not show any persistent mass or distortion.  Furthermore, Dr. Owens Shark was not able to palpate any abnormality in the lateral portion of the left breast.  Ultrasound showed normal appearing fibroglandular tissue in the area in question.    More recently, about September, the patient felt again something like a lump in the left breast and she had some pain associated with this.  This was initially felt simply to be mastalgia and was treated as such, but as it persisted, on December 23rd, the patient had left diagnostic mammography and ultrasonography.  There was a focally dense area in the left breast with very minimal distortion corresponding to the area of palpable abnormality. The ultrasound showed an irregular ill-defined, hypoechoic area with distal shadowing, measuring approximately 1 cm corresponding to the patient's palpable abnormality.    With this information, the patient was brought back for ultrasound-guided core biopsy and this was performed December 28th. 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  There was no evidence of angiolymphatic invasion in the material submitted which measured 1.5 cm maximally.  In addition to the mass in the breast, a lymph node in the left axilla was biopsied and was likewise positive.  The tumor was ER positive at 89%, PR positive at 34% with an elevated MIB-1 at 44% and Her-2 negative with a ratio of 1.13.    Her subsequent history is as detailed below.  INTERVAL HISTORY: Brooke [name] returns for follow up of her metastatic breast cancer, accompanied by her husband, [name]. She completed her sixth cycle of full dose capecitabine 12/27/2013. She only missed one dose. She just did not feel good that day. She is tolerating it with no significant palmar or plantar erythrodysesthesia, diarrhea, or mouth sores. Incidentally [name] is scheduled for a stress test later this week. Their son who is in the [employer] dispense 3 days with them and her daughter and her college roommate will be arriving [phone] for the holidays.  REVIEW OF SYSTEMS: Brooke [name] is taking between 18 and 22 mg of diluted a day. With this she is able to keep her pain at 5 or less. She is having good bowel movements, which are soft and regular. She is also on Duragesic's 25, and last time we tried to go to 50 she was very lethargic. She has no cough or pleurisy. She does get significantly short of breath with activity. She denies any unusual headaches, visual changes, nausea, or vomiting. Aside from these issues, a detailed review of systems today was stable  PAST MEDICAL HISTORY: Past Medical  History  Diagnosis Date  . Hx Breast Cancer, IDC, Stahe III, receptor + 10/20/2010    left breast  . Breast cancer December 2010    invasive ductal and invasive lobular; bilateral mastectomy and radiation  . Mitral valve prolapse   . History of recurrent UTIs   . Radiation 07/26/09-09/08/09    left breast  . Fracture, pelvis closed 11/2012     both hipsand number 7 rib, 15 rounds radiation  . Metastasis to bone   1. Remote migraines, currently inactive.   2. History of prior diagnosis of fibromyalgia made at [facility] and currently not active (she was treated with trimethoprim and nortriptyline for one year for this).    3. History of recurrent UTIs.   4. History of benign left breast cyst aspirated in 2004 at [facility] under Dr. Melene Plan.   5. History of tonsillectomy and adenoidectomy.   6. History of right LASEK surgery. 7. History of bilateral reduction mammoplasties in 1994.   8. History of mitral valve prolapse diagnosed in 1984.  PAST SURGICAL HISTORY: Past Surgical History  Procedure Laterality Date  . Mastectomy Bilateral 2011    left breast cancer  . Lasik    . Reduction mammaplasty Bilateral 1994  . Tonsillectomy and adenoidectomy    . Aspiration of left breast Left 2004    [facility]/Dr.Teo  . Bone biopsy  02/19/13    mets from breast cancer-receptors results not back    FAMILY HISTORY Family History  Problem Relation Age of Onset  . Lung cancer Father 93    smoker for 25 years  . Heart disease Maternal Grandmother   . Heart disease Maternal Grandfather   . Diabetes type I Mother   . Diabetes type I Sister   . Diabetes type I Sister   . Heart disease Maternal Uncle   . Uterine cancer Paternal Grandmother 27  . Breast cancer Other 25    paternal great grandmother  [name] died at the age of 24 from lung cancer.  He had been a remote smoker and had been in [employer] with a question of possible asbestos exposure.  [name] died at the age of 66 in the setting of Alzheimer's, coronary artery disease, diabetes and hypertension.  The patient had two sisters.  One died from complications of diabetes.  The other one is alive with diabetes.    GYNECOLOGIC HISTORY: She is GX P1.  First pregnancy to term at age 47.  The patient's last period  was in 2008.  She took hormone replacement for about 18 months until mid-2008.  She had no complications from that treatment.    SOCIAL HISTORY: (Updated October 2014) She used to be a Arts development officer for [employer]  They used to live in the [place] area. 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  Her husband, [name], retired October 2012.  He was a Arts development officer for 28 years.  Their son, [name], is currently with the [employer] at the [employer] in [place]. [name].  She attends [employer].   ADVANCED DIRECTIVES: in place  HEALTH MAINTENANCE: (Updated 11/19/2012) History  Substance Use Topics  . Smoking status: Former Smoker -- 1.00 packs/day for 11 years    Quit date: 01/23/1984  . Smokeless tobacco: Never Used  . Alcohol Use: No     Colonoscopy: 2010  PAP: Jan  2014  Bone density: July 2014, osteopenia  Lipid panel: per Dr Valentina Lucks  Allergies  Allergen Reactions  . Sulfa Antibiotics Shortness Of Breath  . Codeine Nausea And Vomiting    Tolerates oxycodone if given with prochlorperazine for nausea  . Morphine And Related Nausea And Vomiting    Per patient possibility that the IR morphine was causing the vomiting   Current outpatient prescriptions: ALPRAZolam (XANAX XR) 1 MG 24 hr tablet, Take 1 tablet (1 mg total) by mouth daily., Disp: 90 tablet, Rfl: 0;  Calcium Citrate (CITRACAL PO), Take 3 tablets by mouth daily., Disp: , Rfl: ;  capecitabine (XELODA) 500 MG tablet, Take 1,500 mg by mouth See admin instructions. Take twice daily after meals for 7 days then stop for 7 days, Disp: , Rfl:  cholecalciferol (VITAMIN D) 400 UNITS TABS tablet, Take 400 Units by mouth daily. Patient reportd the Vitamin E should be vitamin D with same dose, Disp: , Rfl: ;  diphenhydrAMINE (BENADRYL) 25 mg capsule, Take 25 mg by mouth at bedtime as needed for sleep (sleep). , Disp: , Rfl: ;  fentaNYL (DURAGESIC -  DOSED MCG/HR) 25 MCG/HR patch, Place 1 patch (25 mcg total) onto the skin every 3 (three) days., Disp: 10 patch, Rfl: 0 gabapentin (NEURONTIN) 300 MG capsule, TAKE 1 CAPSULE (300 MG TOTAL) BY MOUTH AT BEDTIME., Disp: 90 capsule, Rfl: 0;  HYDROmorphone (DILAUDID) 4 MG tablet, Take 1-2 tablets (4-8 mg total) by mouth every 4 (four) hours as needed for severe pain (pain)., Disp: 120 tablet, Rfl: 0;  ondansetron (ZOFRAN) 8 MG tablet, Take 8 mg by mouth 2 (two) times daily as needed for nausea or vomiting (nausea & vomiting)., Disp: , Rfl:  Polyethyl Glycol-Propyl Glycol (SYSTANE) 0.4-0.3 % SOLN, Apply 1 drop to eye 2 (two) times daily as needed (dry eyes)., Disp: , Rfl: ;  polyethylene glycol (MIRALAX / GLYCOLAX) packet, Take 17 g by mouth daily as needed for mild constipation., Disp: , Rfl: ;  PRESCRIPTION MEDICATION, Chemo [facility], Disp: , Rfl:  prochlorperazine (COMPAZINE) 5 MG tablet, Take 1 tablet (5 mg total) by mouth every 6 (six) hours as needed for nausea or vomiting., Disp: 120 tablet, Rfl: 2;  venlafaxine XR (EFFEXOR-XR) 75 MG 24 hr capsule, Take 2 capsules (150 mg total) by mouth daily with breakfast., Disp: 180 capsule, Rfl: 1  OBJECTIVE: Middle-aged white woman examined in a wheelchair  Filed Vitals:   12/28/13 1149  BP: 165/89  Pulse: 78  Temp: 98 F (36.7 C)  Resp: 18  Body mass index is 31.98 kg/(m^2).  ECOG 2 Sclerae unicteric, pupils equal and reactive, EOMs intact Oropharynx clear and moist-- no thrush noted No cervical or supraclavicular adenopathy Lungs no rales or rhonchi Heart regular rate and rhythm Abd soft, obese, nontender, positive bowel sounds MSK kyphosis as noted before; right upper extremity in a sling Neuro: nonfocal, well oriented, appropriate affect Breasts: Deferred    LAB RESULTS:  Lab Results  Component Value Date   WBC 4.5 12/28/2013   NEUTROABS 3.1 12/28/2013   HGB 11.2* 12/28/2013   HCT 34.4* 12/28/2013   MCV 95.5 12/28/2013   PLT 195  12/28/2013      Chemistry      Component Value Date/Time   NA 137 12/28/2013 1105   NA 141 03/29/2013 0350   NA 142 06/26/2011 0728   K 4.3 12/28/2013 1105   K 3.7 03/29/2013 0350   K 4.6 06/26/2011 0728   CL 111 03/29/2013 0350  CL 105 01/07/2012 1347   CL 102 06/26/2011 0728   CO2 22 12/28/2013 1105   CO2 18* 03/29/2013 0350   CO2 30 06/26/2011 0728   BUN 13.6 12/28/2013 1105   BUN 10 03/29/2013 0350   BUN 12 06/26/2011 0728   CREATININE 0.9 12/28/2013 1105   CREATININE 0.72 03/29/2013 0350   CREATININE 1.1 06/26/2011 0728      Component Value Date/Time   CALCIUM 9.3 12/28/2013 1105   CALCIUM 7.8* 03/29/2013 0350   CALCIUM 9.1 06/26/2011 0728   ALKPHOS 238* 12/28/2013 1105   ALKPHOS 91 04/30/2013 1301   AST 28 12/28/2013 1105   AST 67* 04/30/2013 1301   ALT 17 12/28/2013 1105   ALT 93* 04/30/2013 1301   BILITOT 0.36 12/28/2013 1105   BILITOT 0.4 04/30/2013 1301      Results for RANEISHA, BRESS (MRN [id number]) as of 12/28/2013 08:05  Ref. Range 08/28/2013 08:53 09/24/2013 14:15 10/12/2013 13:29 10/30/2013 14:31 11/30/2013 11:02  CA 27.29 Latest Range: 0-39 U/mL 394 (H) 363 (H) 342 (H) 337 (H) 383 (H)  CEA Latest Range: 0.0-5.0 ng/mL 134.0 (H) 90.4 (H)  85.5 (H) 93.6 (H)    STUDIES: Dg Clavicle Right  12/20/2013   CLINICAL DATA:  [place] in bathroom with right shoulder pain. Right clavicle pain.  EXAM: RIGHT CLAVICLE - 2+ VIEWS  COMPARISON:  Right shoulder 12/20/2013  FINDINGS: The right clavicle is intact. There is a minimally displaced fracture involving the proximal right humerus, best seen on the right humerus images. No evidence for a right pneumothorax. There is mild sclerosis in the proximal right humerus. Patient has history of breast cancer and known bone lesions. Difficult to exclude a pathologic fracture in the right humeral neck. There is also irregular sclerosis involving the lateral right scapula. Evidence for old right rib fractures.  IMPRESSION: Minimally  displaced fracture involving the right humeral neck. There is mild sclerosis in the proximal right humerus and this could be a pathologic fracture.   Electronically Signed   By: Markus Daft M.D.   On: 12/20/2013 09:05   Dg Shoulder Right  12/20/2013   CLINICAL DATA:  Fall, right shoulder pain  EXAM: RIGHT SHOULDER - 2+ VIEW  COMPARISON:  None.  FINDINGS: Suspected nondisplaced right humeral neck fracture.  Underlying sclerosis involving the proximal humerus raises the possibility of pathologic fracture.  The joint spaces are preserved.  Visualized right lung is clear.  IMPRESSION: Suspected nondisplaced right humeral neck fracture.  Underlying sclerosis involving the proximal humerus raises the possibility of pathologic fracture.   Electronically Signed   By: Julian Hy M.D.   On: 12/20/2013 08:56   Ct Cervical Spine Wo Contrast  12/20/2013   CLINICAL DATA:  Right sided injury s/p fall today.Nurse note: Pt was holding on to the counter in the bathroom when she fell on her right side into the shower then fell to the floor. Pt denies LOC. C/O pain 10/10 throbbing in nature in her right arm. Pt has a hx of breast ca w/ mets to bone.  EXAM: CT CERVICAL SPINE WITHOUT CONTRAST  TECHNIQUE: Multidetector CT imaging of the cervical spine was performed without intravenous contrast. Multiplanar CT image reconstructions were also generated.  COMPARISON:  If is with  FINDINGS: Extensive mixed lytic and sclerotic lesions involving all levels. No significant compression deformity. Subacute fracture of the C7 transverse process with some healing response, no definite solid bone bridging. This was visible on prior study. Straightening of the normal cervical lordosis.  Mild narrowing of interspaces C4-5, C5-6, C6-7 with small anterior and posterior endplate spurs. Regional soft tissues unremarkable. Visualized lung apices clear.  IMPRESSION: 1. Extensive metastatic disease without acute bone abnormality. 2. Stable  positioning of subacute C7 spinous process fracture fragments.   Electronically Signed   By: Arne Cleveland M.D.   On: 12/20/2013 11:14   Dg Humerus Right  12/20/2013   ADDENDUM REPORT: 12/20/2013 09:10  ADDENDUM: Patient has history of breast cancer. There is mild sclerosis in the proximal right humerus. There is mild cortical irregularity in the mid right humerus and this could represent metastatic disease. Difficult to exclude a subtle injury or lesion at this location.  Impression: Mild sclerosis in the proximal right humerus is concerning for metastatic disease. Along with the humeral neck fracture, cannot exclude a subtle injury or lesion in the mid humeral shaft.   Electronically Signed   By: Markus Daft M.D.   On: 12/20/2013 09:10   12/20/2013   CLINICAL DATA:  [place] in bathroom and landed on right side. Right shoulder pain.  EXAM: RIGHT HUMERUS - 2+ VIEW  COMPARISON:  12/20/2013  FINDINGS: There is a mildly displaced fracture involving the proximal right humerus. The mid and distal humerus are intact. Right shoulder appears to be located. The right AC joint is intact. No evidence for a large right pneumothorax.  IMPRESSION: Mildly displaced fracture involving the proximal right humerus. Fracture is likely involving the surgical neck.  Electronically Signed: By: Markus Daft M.D. On: 12/20/2013 08:56     ASSESSMENT: 63 y.o. [place] woman   (1)  status post Left breast and Left axillary lymph node biopsy Dec 2010 for a cT3 pN1 (stage IIIA) invasive ductal carcinoma, grade 2, estrogen and progesterone receptor positive, HER-2 negative, with an MIB-1 of 44%;   (2)  treated neoadjuvantly with dose dense cyclophosphamide and doxorubicin x4 followed by weekly paclitaxel x7  (3) s/p bilateral mastectomies May 2011. She had a residual 1.5 cm tumor, and one of 8 sampled lymph nodes was positive [ypT1c ypN1]. Margins were negative.   (4)  She completed post-mastectomy radiation in August of 2011    (5) on letrozole between August 2011 and October 2014  (6) PET scan 11/05/2012 obtained to evaluate right chest wall pain documents bony metastatic disease to the right seventh rib, L1, and the right acetabulum with an associated pathologic fracture through the anterior cortex of the acetabulum measuring 3.2 cm. There was no evidence of extra osseous metastatic disease.  (7) starting 11/05/2012 a received fulvestrant, 500 mg monthly, in addition to monthly infusions of zoledronic acid. The fulvestrant was discontinued May 2015 with evidence of progression  (8) Right rib, left hip and right hip were all treated to 37.5 Gy in 15 fractions at 2.5 Gy per fraction completed 12/17/2012  (9) 02/19/2013 bone marrow biopsy confirms metastatic disease to bone. HER-2 was not done because of decaalcification of specimen. Estrogen and progesterone receptors were read as negative. This i was felt to be possibly discordant.  (10) patient's tumor carries a PIK3 mutation as documented at [facility]  (11) L ribcage pain: s/p palliative XRT completed 06/16/2013  (12) declined participation in a capecitabine plus PIK 3 inhibitor at [facility]; started capecitabine alone 06/27/2013, discontinued after one week because of side effects;  (a) resumed 08/17/2013 at 500 mg twice a day one-week on one-week off  (b) dose being increased gradually, received 1000 mg BID with cycle starting 09/28/2013  (c) full dose  of 1559m BID started 10/12/13  (13) radiation to Right sacrum/ lower lumbar spine 06/10 to 06/23   (14) non-displaced fracture proximal right humerus secondary to a fall November 2015   PLAN:  PFraser Dincontinues on capecitabine, which she takes at 1.5 g twice daily one-week off one week on area her most recent PET scan showed stable disease. The plan is to continue this and repeat a PET scan sometime in February.  Of course she had a 2 herbal fall and fractured the top of her right  humerus. We discussed this at length today. Because the fracture is nondisplaced, there is really nothing to do except where the sling. So long as she keeps her right upper arm abduction did if she wishes to take off the sling so she has a little bit more motion with the forearm I think that would not heard anything.  As far as pain is concerned I wonder if we will might be doing better at this point giving the 50 mg of Duragesic Transderm trial. Last time we did this she became very lethargic. Since then though she has had several more months of narcotics and probably has developed better tolerance. Since her daughter is not coming until 01/06/2014, we have a few days to "experimental" and they are willing to give that a try. Just make sure they don't run out I rewrote the fentanyl and I loaded prescriptions for filled, but did not take those scripts.  She is doing great as far as her bowel prophylaxis regimen is concerned. Incidentally they have redesigned several portions of her house so she can go from downstairs to upstairs and use a remodeled bathroom for washing. This is a lot more comfortable and a lot safer for her.  We can't use the right arm currently for the zolendronate and we shouldn't be using the left one because of the prior surgery. Accordingly we are switching from zolendronate to denosumab. I have checked with our billing Department and they tell me it is okay to make that change.  Otherwise PFraser Dinwill see [place] in 2 weeks. She will see me specifically in 4 weeks. We will repeat the denosumab at that time. She knows to call for any problems that may develop before that visit. MChauncey Cruel MD  12/28/2013

## 2013-12-28 NOTE — Progress Notes (Signed)
Per Val, RN: Pt to receive Delton See only today. Given in injection room.

## 2013-12-28 NOTE — Patient Instructions (Signed)
Denosumab injection What is this medicine? DENOSUMAB (den oh sue mab) slows bone breakdown. Prolia is used to treat osteoporosis in women after menopause and in men. Xgeva is used to prevent bone fractures and other bone problems caused by cancer bone metastases. Xgeva is also used to treat giant cell tumor of the bone. This medicine may be used for other purposes; ask your health care provider or pharmacist if you have questions. COMMON BRAND NAME(S): Prolia, XGEVA What should I tell my health care provider before I take this medicine? They need to know if you have any of these conditions: -dental disease -eczema -infection or history of infections -kidney disease or on dialysis -low blood calcium or vitamin D -malabsorption syndrome -scheduled to have surgery or tooth extraction -taking medicine that contains denosumab -thyroid or parathyroid disease -an unusual reaction to denosumab, other medicines, foods, dyes, or preservatives -pregnant or trying to get pregnant -breast-feeding How should I use this medicine? This medicine is for injection under the skin. It is given by a health care professional in a hospital or clinic setting. If you are getting Prolia, a special MedGuide will be given to you by the pharmacist with each prescription and refill. Be sure to read this information carefully each time. For Prolia, talk to your pediatrician regarding the use of this medicine in children. Special care may be needed. For Xgeva, talk to your pediatrician regarding the use of this medicine in children. While this drug may be prescribed for children as young as 13 years for selected conditions, precautions do apply. Overdosage: If you think you've taken too much of this medicine contact a poison control center or emergency room at once. Overdosage: If you think you have taken too much of this medicine contact a poison control center or emergency room at once. NOTE: This medicine is only for  you. Do not share this medicine with others. What if I miss a dose? It is important not to miss your dose. Call your doctor or health care professional if you are unable to keep an appointment. What may interact with this medicine? Do not take this medicine with any of the following medications: -other medicines containing denosumab This medicine may also interact with the following medications: -medicines that suppress the immune system -medicines that treat cancer -steroid medicines like prednisone or cortisone This list may not describe all possible interactions. Give your health care provider a list of all the medicines, herbs, non-prescription drugs, or dietary supplements you use. Also tell them if you smoke, drink alcohol, or use illegal drugs. Some items may interact with your medicine. What should I watch for while using this medicine? Visit your doctor or health care professional for regular checks on your progress. Your doctor or health care professional may order blood tests and other tests to see how you are doing. Call your doctor or health care professional if you get a cold or other infection while receiving this medicine. Do not treat yourself. This medicine may decrease your body's ability to fight infection. You should make sure you get enough calcium and vitamin D while you are taking this medicine, unless your doctor tells you not to. Discuss the foods you eat and the vitamins you take with your health care professional. See your dentist regularly. Brush and floss your teeth as directed. Before you have any dental work done, tell your dentist you are receiving this medicine. Do not become pregnant while taking this medicine or for 5 months after stopping   it. Women should inform their doctor if they wish to become pregnant or think they might be pregnant. There is a potential for serious side effects to an unborn child. Talk to your health care professional or pharmacist for more  information. What side effects may I notice from receiving this medicine? Side effects that you should report to your doctor or health care professional as soon as possible: -allergic reactions like skin rash, itching or hives, swelling of the face, lips, or tongue -breathing problems -chest pain -fast, irregular heartbeat -feeling faint or lightheaded, falls -fever, chills, or any other sign of infection -muscle spasms, tightening, or twitches -numbness or tingling -skin blisters or bumps, or is dry, peels, or red -slow healing or unexplained pain in the mouth or jaw -unusual bleeding or bruising Side effects that usually do not require medical attention (Report these to your doctor or health care professional if they continue or are bothersome.): -muscle pain -stomach upset, gas This list may not describe all possible side effects. Call your doctor for medical advice about side effects. You may report side effects to FDA at 1-800-FDA-1088. Where should I keep my medicine? This medicine is only given in a clinic, doctor's office, or other health care setting and will not be stored at home. NOTE: This sheet is a summary. It may not cover all possible information. If you have questions about this medicine, talk to your doctor, pharmacist, or health care provider.  2015, Elsevier/Gold Standard. (2011-07-09 12:37:47)  

## 2013-12-28 NOTE — Addendum Note (Signed)
Addended by: Laureen Abrahams on: 12/28/2013 05:46 PM   Modules accepted: Medications

## 2013-12-29 ENCOUNTER — Encounter: Payer: Self-pay | Admitting: Oncology

## 2013-12-29 LAB — CANCER ANTIGEN 27.29: CA 27.29: 328 U/mL — ABNORMAL HIGH (ref 0–39)

## 2013-12-29 LAB — CEA: CEA: 79 ng/mL — ABNORMAL HIGH (ref 0.0–5.0)

## 2014-01-02 NOTE — Telephone Encounter (Signed)
No entry 

## 2014-01-03 ENCOUNTER — Other Ambulatory Visit: Payer: Self-pay | Admitting: Oncology

## 2014-01-03 DIAGNOSIS — C50919 Malignant neoplasm of unspecified site of unspecified female breast: Secondary | ICD-10-CM

## 2014-01-09 ENCOUNTER — Other Ambulatory Visit: Payer: Self-pay | Admitting: Nurse Practitioner

## 2014-01-11 ENCOUNTER — Ambulatory Visit (HOSPITAL_BASED_OUTPATIENT_CLINIC_OR_DEPARTMENT_OTHER): Payer: Medicare Other | Admitting: Nurse Practitioner

## 2014-01-11 ENCOUNTER — Other Ambulatory Visit: Payer: Self-pay | Admitting: *Deleted

## 2014-01-11 ENCOUNTER — Encounter: Payer: Self-pay | Admitting: Nurse Practitioner

## 2014-01-11 ENCOUNTER — Other Ambulatory Visit: Payer: Self-pay | Admitting: Nurse Practitioner

## 2014-01-11 VITALS — BP 162/91 | HR 87 | Temp 98.4°F | Resp 18 | Ht 64.0 in | Wt 188.0 lb

## 2014-01-11 DIAGNOSIS — G893 Neoplasm related pain (acute) (chronic): Secondary | ICD-10-CM

## 2014-01-11 DIAGNOSIS — C50911 Malignant neoplasm of unspecified site of right female breast: Secondary | ICD-10-CM

## 2014-01-11 DIAGNOSIS — C7951 Secondary malignant neoplasm of bone: Principal | ICD-10-CM

## 2014-01-11 DIAGNOSIS — C50512 Malignant neoplasm of lower-outer quadrant of left female breast: Secondary | ICD-10-CM

## 2014-01-11 DIAGNOSIS — C50919 Malignant neoplasm of unspecified site of unspecified female breast: Secondary | ICD-10-CM

## 2014-01-11 DIAGNOSIS — M79603 Pain in arm, unspecified: Secondary | ICD-10-CM

## 2014-01-11 DIAGNOSIS — Z170 Estrogen receptor positive status [ER+]: Secondary | ICD-10-CM

## 2014-01-11 LAB — COMPREHENSIVE METABOLIC PANEL (CC13)
ALK PHOS: 200 U/L — AB (ref 40–150)
ALT: 21 U/L (ref 0–55)
AST: 28 U/L (ref 5–34)
Albumin: 3.2 g/dL — ABNORMAL LOW (ref 3.5–5.0)
Anion Gap: 9 mEq/L (ref 3–11)
BILIRUBIN TOTAL: 0.36 mg/dL (ref 0.20–1.20)
BUN: 8.7 mg/dL (ref 7.0–26.0)
CO2: 22 mEq/L (ref 22–29)
Calcium: 9.1 mg/dL (ref 8.4–10.4)
Chloride: 104 mEq/L (ref 98–109)
Creatinine: 0.8 mg/dL (ref 0.6–1.1)
EGFR: 83 mL/min/{1.73_m2} — ABNORMAL LOW (ref >90)
Glucose: 104 mg/dl (ref 70–140)
Potassium: 4.5 mEq/L (ref 3.5–5.1)
Sodium: 136 mEq/L (ref 136–145)
TOTAL PROTEIN: 6 g/dL — AB (ref 6.4–8.3)

## 2014-01-11 LAB — CBC WITH DIFFERENTIAL/PLATELET
BASO%: 0.6 % (ref 0.0–2.0)
BASOS ABS: 0 10*3/uL (ref 0.0–0.1)
EOS%: 2.9 % (ref 0.0–7.0)
Eosinophils Absolute: 0.2 10*3/uL (ref 0.0–0.5)
HCT: 34.9 % (ref 34.8–46.6)
HGB: 11.3 g/dL — ABNORMAL LOW (ref 11.6–15.9)
LYMPH#: 0.8 10*3/uL — AB (ref 0.9–3.3)
LYMPH%: 15.3 % (ref 14.0–49.7)
MCH: 31.2 pg (ref 25.1–34.0)
MCHC: 32.5 g/dL (ref 31.5–36.0)
MCV: 96.1 fL (ref 79.5–101.0)
MONO#: 0.6 10*3/uL (ref 0.1–0.9)
MONO%: 12.3 % (ref 0.0–14.0)
NEUT%: 68.9 % (ref 38.4–76.8)
NEUTROS ABS: 3.6 10*3/uL (ref 1.5–6.5)
Platelets: 190 10*3/uL (ref 145–400)
RBC: 3.63 10*6/uL — AB (ref 3.70–5.45)
RDW: 17.5 % — AB (ref 11.2–14.5)
WBC: 5.2 10*3/uL (ref 3.9–10.3)

## 2014-01-11 NOTE — Progress Notes (Signed)
ID: Brooke [name]   DOB: 06-01-1950  MR#: [id number]  EPP#:[id number]  PCP: Irven Shelling, MD GYN: SU:  OTHER MD: Thea Silversmith, Gaynelle Arabian, [name] [phone]), Willa Rough DDS  CHIEF COMPLAINT: stage IV breast cancer CURRENT THERAPY: zolendronate, capecitabine  BREAST CANCER HISTORY: From the January 2011 summary:  Brooke [name] had screening mammography in August 04, 2007 which showed no specific mammographic evidence of malignancy, but she reported a left breast lump.  Accordingly, she was brought back on August 11, 2007 for mammography and ultrasonography.  There was indeed an obscured mass in the upper left breast which by ultrasound appeared to be a simple cyst.    Screening mammogram on August 04, 2008 showed in addition to dense breasts, again a possible mass in the left breast.  She had diagnostic mammography August 06, 2008 and additional views in addition to very dense breasts did not show any persistent mass or distortion.  Furthermore, Dr. Owens Shark was not able to palpate any abnormality in the lateral portion of the left breast.  Ultrasound showed normal appearing fibroglandular tissue in the area in question.    More recently, about September, the patient felt again something like a lump in the left breast and she had some pain associated with this.  This was initially felt simply to be mastalgia and was treated as such, but as it persisted, on December 23rd, the patient had left diagnostic mammography and ultrasonography.  There was a focally dense area in the left breast with very minimal distortion corresponding to the area of palpable abnormality. The ultrasound showed an irregular ill-defined, hypoechoic area with distal shadowing, measuring approximately 1 cm corresponding to the patient's palpable abnormality.    With this information, the patient was brought back for ultrasound-guided core biopsy and this was performed December 28th. 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  There was no evidence of angiolymphatic invasion in the material submitted which measured 1.5 cm maximally.  In addition to the mass in the breast, a lymph node in the left axilla was biopsied and was likewise positive.  The tumor was ER positive at 89%, PR positive at 34% with an elevated MIB-1 at 44% and Her-2 negative with a ratio of 1.13.    Her subsequent history is as detailed below.  INTERVAL HISTORY: Brooke [name] returns for follow up of her metastatic breast cancer, accompanied by her husband, [name]. She completed her 7th cycle of full dose capecitabine on 01/10/2014. She missed one dose in the middle of the cycle because she was not feeling well. She is tolerating it with no significant palmar or plantar erythrodysesthesia, diarrhea, or mouth sores.   REVIEW OF SYSTEMS: Brooke [name] is down to taking 16-$RemoveBefo'20mg'gNBsYfJAcTy$  diluadid daily. With this she is able to keep her pain at 5 or less. She is having good bowel movements, which are soft and regular with miralax daily. She is also on 46mcg fentanyl patches. She was going to try the 41mcg variety, but prefers to stay alert as best she can and changed her mind. She has shortness of breath with exertion. Her husband now helps her back and forth to the bathroom at night because of balance issues since breaking her right humerus. She denies chest pain, cough, or palpitations. She is fatigued on the week that she is on the capecitabine. She denies any unusual headaches, visual changes, nausea, or vomiting. Aside from these issues, a detailed review of systems today was  stable  PAST MEDICAL HISTORY: Past Medical History  Diagnosis Date  . Hx Breast Cancer, IDC, Stahe III, receptor + 10/20/2010    left breast  . Breast cancer December 2010    invasive ductal and invasive lobular; bilateral mastectomy and radiation  . Mitral valve prolapse   . History of recurrent UTIs   . Radiation  07/26/09-09/08/09    left breast  . Fracture, pelvis closed 11/2012    both hipsand number 7 rib, 15 rounds radiation  . Metastasis to bone   1. Remote migraines, currently inactive.   2. History of prior diagnosis of fibromyalgia made at [facility] and currently not active (she was treated with trimethoprim and nortriptyline for one year for this).    3. History of recurrent UTIs.   4. History of benign left breast cyst aspirated in 2004 at [facility] under Dr. Melene Plan.   5. History of tonsillectomy and adenoidectomy.   6. History of right LASEK surgery. 7. History of bilateral reduction mammoplasties in 1994.   8. History of mitral valve prolapse diagnosed in 1984.  PAST SURGICAL HISTORY: Past Surgical History  Procedure Laterality Date  . Mastectomy Bilateral 2011    left breast cancer  . Lasik    . Reduction mammaplasty Bilateral 1994  . Tonsillectomy and adenoidectomy    . Aspiration of left breast Left 2004    [facility]/Dr.Teo  . Bone biopsy  02/19/13    mets from breast cancer-receptors results not back    FAMILY HISTORY Family History  Problem Relation Age of Onset  . Lung cancer Father 15    smoker for 25 years  . Heart disease Maternal Grandmother   . Heart disease Maternal Grandfather   . Diabetes type I Mother   . Diabetes type I Sister   . Diabetes type I Sister   . Heart disease Maternal Uncle   . Uterine cancer Paternal Grandmother 52  . Breast cancer Other 82    paternal great grandmother  [name] died at the age of 33 from lung cancer.  He had been a remote smoker and had been in [employer] with a question of possible asbestos exposure.  [name] died at the age of 35 in the setting of Alzheimer's, coronary artery disease, diabetes and hypertension.  The patient had two sisters.  One died from complications of diabetes.  The other one is alive with diabetes.    GYNECOLOGIC HISTORY: She is  GX P1.  First pregnancy to term at age 41.  The patient's last period was in 2008.  She took hormone replacement for about 18 months until mid-2008.  She had no complications from that treatment.    SOCIAL HISTORY: (Updated October 2014) She used to be a Arts development officer for [employer]  They used to live in the [place] area. 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  Her husband, [name], retired October 2012.  He was a Arts development officer for 28 years.  Their son, [name], is currently with the [employer] at the [employer] in [place]. [name].  She attends [employer].   ADVANCED DIRECTIVES: in place  HEALTH MAINTENANCE: (Updated 11/19/2012) History  Substance Use Topics  . Smoking status: Former Smoker -- 1.00 packs/day for 11 years    Quit date: 01/23/1984  . Smokeless tobacco: Never Used  . Alcohol Use: No  Colonoscopy: 2010  PAP: Jan 2014  Bone density: July 2014, osteopenia  Lipid panel: per Dr Valentina Lucks  Allergies  Allergen Reactions  . Sulfa Antibiotics Shortness Of Breath  . Codeine Nausea And Vomiting    Tolerates oxycodone if given with prochlorperazine for nausea  . Morphine And Related Nausea And Vomiting    Per patient possibility that the IR morphine was causing the vomiting   Current Outpatient Prescriptions on File Prior to Visit  Medication Sig Dispense Refill  . ALPRAZolam (XANAX XR) 1 MG 24 hr tablet Take 1 tablet (1 mg total) by mouth daily. 90 tablet 0  . Calcium Citrate (CITRACAL PO) Take 3 tablets by mouth daily.    . cholecalciferol (VITAMIN D) 400 UNITS TABS tablet Take 400 Units by mouth daily. Patient reportd the Vitamin E should be vitamin D with same dose    . diphenhydrAMINE (BENADRYL) 25 mg capsule Take 25 mg by mouth at bedtime as needed for sleep (sleep).     . fentaNYL (DURAGESIC - DOSED MCG/HR) 25 MCG/HR patch Place 1 patch (25 mcg total) onto the skin every 3  (three) days. 10 patch 0  . gabapentin (NEURONTIN) 300 MG capsule TAKE 1 CAPSULE (300 MG TOTAL) BY MOUTH AT BEDTIME. 90 capsule 0  . HYDROmorphone (DILAUDID) 4 MG tablet Take 1-2 tablets (4-8 mg total) by mouth every 4 (four) hours as needed for severe pain (pain). 120 tablet 0  . Polyethyl Glycol-Propyl Glycol (SYSTANE) 0.4-0.3 % SOLN Apply 1 drop to eye 2 (two) times daily as needed (dry eyes).    . polyethylene glycol (MIRALAX / GLYCOLAX) packet Take 17 g by mouth daily as needed for mild constipation.    . prochlorperazine (COMPAZINE) 5 MG tablet Take 1 tablet (5 mg total) by mouth every 6 (six) hours as needed for nausea or vomiting. 120 tablet 2  . venlafaxine XR (EFFEXOR-XR) 75 MG 24 hr capsule TAKE 2 CAPSULES (150MG)    DAILY WITH BREAKFAST 180 capsule 0  . capecitabine (XELODA) 500 MG tablet Take 1,500 mg by mouth See admin instructions. Take twice daily after meals for 7 days then stop for 7 days    . ondansetron (ZOFRAN) 8 MG tablet Take 8 mg by mouth 2 (two) times daily as needed for nausea or vomiting (nausea & vomiting).    [name] Kitchen PRESCRIPTION MEDICATION Chemo [facility]     No current facility-administered medications on file prior to visit.    OBJECTIVE: Middle-aged white woman examined in a wheelchair  Filed Vitals:   01/11/14 1311  BP: 162/91  Pulse: 87  Temp: 98.4 F (36.9 C)  Resp: 18  Body mass index is 32.25 kg/(m^2).  ECOG 2 Skin: warm, dry  HEENT: sclerae anicteric, conjunctivae pink, oropharynx clear. No thrush or mucositis.  Lymph Nodes: No cervical or supraclavicular lymphadenopathy  Lungs: clear to auscultation bilaterally, no rales, wheezes, or rhonci  Heart: regular rate and rhythm  Abdomen: round, soft, non tender, positive bowel sounds  Musculoskeletal:  Right arm in sling, kyphosis, 1+ non-pitting edema to bilateral lower extremities.  Neuro: non focal, well oriented, positive affect   LAB RESULTS:  Lab Results  Component Value Date   WBC 5.2 01/11/2014    NEUTROABS 3.6 01/11/2014   HGB 11.3* 01/11/2014   HCT 34.9 01/11/2014   MCV 96.1 01/11/2014   PLT 190 01/11/2014      Chemistry      Component Value Date/Time   NA 136 01/11/2014 1240   NA 141 03/29/2013  0350   NA 142 06/26/2011 0728   K 4.5 01/11/2014 1240   K 3.7 03/29/2013 0350   K 4.6 06/26/2011 0728   CL 111 03/29/2013 0350   CL 105 01/07/2012 1347   CL 102 06/26/2011 0728   CO2 22 01/11/2014 1240   CO2 18* 03/29/2013 0350   CO2 30 06/26/2011 0728   BUN 8.7 01/11/2014 1240   BUN 10 03/29/2013 0350   BUN 12 06/26/2011 0728   CREATININE 0.8 01/11/2014 1240   CREATININE 0.72 03/29/2013 0350   CREATININE 1.1 06/26/2011 0728      Component Value Date/Time   CALCIUM 9.1 01/11/2014 1240   CALCIUM 7.8* 03/29/2013 0350   CALCIUM 9.1 06/26/2011 0728   ALKPHOS 200* 01/11/2014 1240   ALKPHOS 91 04/30/2013 1301   AST 28 01/11/2014 1240   AST 67* 04/30/2013 1301   ALT 21 01/11/2014 1240   ALT 93* 04/30/2013 1301   BILITOT 0.36 01/11/2014 1240   BILITOT 0.4 04/30/2013 1301      Results for LAMIRA, BORIN (MRN [id number]) as of 12/28/2013 08:05  Ref. Range 08/28/2013 08:53 09/24/2013 14:15 10/12/2013 13:29 10/30/2013 14:31 11/30/2013 11:02  CA 27.29 Latest Range: 0-39 U/mL 394 (H) 363 (H) 342 (H) 337 (H) 383 (H)  CEA Latest Range: 0.0-5.0 ng/mL 134.0 (H) 90.4 (H)  85.5 (H) 93.6 (H)    STUDIES: Dg Clavicle Right  12/20/2013   CLINICAL DATA:  [place] in bathroom with right shoulder pain. Right clavicle pain.  EXAM: RIGHT CLAVICLE - 2+ VIEWS  COMPARISON:  Right shoulder 12/20/2013  FINDINGS: The right clavicle is intact. There is a minimally displaced fracture involving the proximal right humerus, best seen on the right humerus images. No evidence for a right pneumothorax. There is mild sclerosis in the proximal right humerus. Patient has history of breast cancer and known bone lesions. Difficult to exclude a pathologic fracture in the right humeral neck. There is also irregular  sclerosis involving the lateral right scapula. Evidence for old right rib fractures.  IMPRESSION: Minimally displaced fracture involving the right humeral neck. There is mild sclerosis in the proximal right humerus and this could be a pathologic fracture.   Electronically Signed   By: Markus Daft M.D.   On: 12/20/2013 09:05   Dg Shoulder Right  12/20/2013   CLINICAL DATA:  Fall, right shoulder pain  EXAM: RIGHT SHOULDER - 2+ VIEW  COMPARISON:  None.  FINDINGS: Suspected nondisplaced right humeral neck fracture.  Underlying sclerosis involving the proximal humerus raises the possibility of pathologic fracture.  The joint spaces are preserved.  Visualized right lung is clear.  IMPRESSION: Suspected nondisplaced right humeral neck fracture.  Underlying sclerosis involving the proximal humerus raises the possibility of pathologic fracture.   Electronically Signed   By: Julian Hy M.D.   On: 12/20/2013 08:56   Ct Cervical Spine Wo Contrast  12/20/2013   CLINICAL DATA:  Right sided injury s/p fall today.Nurse note: Pt was holding on to the counter in the bathroom when she fell on her right side into the shower then fell to the floor. Pt denies LOC. C/O pain 10/10 throbbing in nature in her right arm. Pt has a hx of breast ca w/ mets to bone.  EXAM: CT CERVICAL SPINE WITHOUT CONTRAST  TECHNIQUE: Multidetector CT imaging of the cervical spine was performed without intravenous contrast. Multiplanar CT image reconstructions were also generated.  COMPARISON:  If is with  FINDINGS: Extensive mixed lytic and sclerotic lesions involving all  levels. No significant compression deformity. Subacute fracture of the C7 transverse process with some healing response, no definite solid bone bridging. This was visible on prior study. Straightening of the normal cervical lordosis. Mild narrowing of interspaces C4-5, C5-6, C6-7 with small anterior and posterior endplate spurs. Regional soft tissues unremarkable. Visualized lung  apices clear.  IMPRESSION: 1. Extensive metastatic disease without acute bone abnormality. 2. Stable positioning of subacute C7 spinous process fracture fragments.   Electronically Signed   By: Arne Cleveland M.D.   On: 12/20/2013 11:14   Dg Humerus Right  12/20/2013   ADDENDUM REPORT: 12/20/2013 09:10  ADDENDUM: Patient has history of breast cancer. There is mild sclerosis in the proximal right humerus. There is mild cortical irregularity in the mid right humerus and this could represent metastatic disease. Difficult to exclude a subtle injury or lesion at this location.  Impression: Mild sclerosis in the proximal right humerus is concerning for metastatic disease. Along with the humeral neck fracture, cannot exclude a subtle injury or lesion in the mid humeral shaft.   Electronically Signed   By: Markus Daft M.D.   On: 12/20/2013 09:10   12/20/2013   CLINICAL DATA:  [place] in bathroom and landed on right side. Right shoulder pain.  EXAM: RIGHT HUMERUS - 2+ VIEW  COMPARISON:  12/20/2013  FINDINGS: There is a mildly displaced fracture involving the proximal right humerus. The mid and distal humerus are intact. Right shoulder appears to be located. The right AC joint is intact. No evidence for a large right pneumothorax.  IMPRESSION: Mildly displaced fracture involving the proximal right humerus. Fracture is likely involving the surgical neck.  Electronically Signed: By: Markus Daft M.D. On: 12/20/2013 08:56     ASSESSMENT: 63 y.o. Gy[place] woman   (1)  status post Left breast and Left axillary lymph node biopsy Dec 2010 for a cT3 pN1 (stage IIIA) invasive ductal carcinoma, grade 2, estrogen and progesterone receptor positive, HER-2 negative, with an MIB-1 of 44%;   (2)  treated neoadjuvantly with dose dense cyclophosphamide and doxorubicin x4 followed by weekly paclitaxel x7  (3) s/p bilateral mastectomies May 2011. She had a residual 1.5 cm tumor, and one of 8 sampled lymph nodes was positive [ypT1c  ypN1]. Margins were negative.   (4)  She completed post-mastectomy radiation in August of 2011   (5) on letrozole between August 2011 and October 2014  (6) PET scan 11/05/2012 obtained to evaluate right chest wall pain documents bony metastatic disease to the right seventh rib, L1, and the right acetabulum with an associated pathologic fracture through the anterior cortex of the acetabulum measuring 3.2 cm. There was no evidence of extra osseous metastatic disease.  (7) starting 11/05/2012 a received fulvestrant, 500 mg monthly, in addition to monthly infusions of zoledronic acid. The fulvestrant was discontinued May 2015 with evidence of progression  (8) Right rib, left hip and right hip were all treated to 37.5 Gy in 15 fractions at 2.5 Gy per fraction completed 12/17/2012  (9) 02/19/2013 bone marrow biopsy confirms metastatic disease to bone. HER-2 was not done because of decaalcification of specimen. Estrogen and progesterone receptors were read as negative. This i was felt to be possibly discordant.  (10) patient's tumor carries a PIK3 mutation as documented at [facility]  (11) L ribcage pain: s/p palliative XRT completed 06/16/2013  (12) declined participation in a capecitabine plus PIK 3 inhibitor at [facility]; started capecitabine alone 06/27/2013, discontinued after one week because of side  effects;  (a) resumed 08/17/2013 at 500 mg twice a day one-week on one-week off  (b) dose being increased gradually, received 1000 mg BID with cycle starting 09/28/2013  (c) full dose of 1557m BID started 10/12/13  (13) radiation to Right sacrum/ lower lumbar spine 06/10 to 06/23   (14) non-displaced fracture proximal right humerus secondary to a fall November 2015   PLAN:  PFraser Dinis doing well today. The labs were reviewed in detail and were entirely stable. She will continue with the full dose capecitabine. The 8th cycle will begin this upcoming Sunday.   She is not in  need of any pain medication refills today. She will continue to dose herself with 412mdilaudid PRN with the 253mfentanyl patches. She is reserving the 43m84QTTplication for when her pain advances. At this time she declines. I have put in requests for her visits through February. She will have a repeat PET scan and discuss the results with Dr. MagJana Hakimterwards.   PatFraser Dinll return in 2 weeks for labs and a follow up visit with Dr. MagJana Hakimhe will also be due for denosumab during that visit as well. Pat understands and agrees with this plan. She knows the goal of treatment in her case is control. She has been encouraged to call with any issues that might arise before her next visit here.   FerMarcelino DusterP  01/11/2014

## 2014-01-12 ENCOUNTER — Telehealth: Payer: Self-pay | Admitting: Emergency Medicine

## 2014-01-12 ENCOUNTER — Telehealth: Payer: Self-pay | Admitting: Nurse Practitioner

## 2014-01-12 LAB — CEA: CEA: 70.4 ng/mL — AB (ref 0.0–5.0)

## 2014-01-12 LAB — CANCER ANTIGEN 27.29: CA 27.29: 306 U/mL — AB (ref 0–39)

## 2014-01-12 NOTE — Telephone Encounter (Signed)
per pof to sch pt appt-cld & spoke to pt & spouse and gave updated appt for 2/17-pt understood stated has [place]

## 2014-01-12 NOTE — Telephone Encounter (Signed)
Patient called to report "white spots" that she noticed on her bilateral lower extremities. She states it is just above her knees and down to her feet. She denies any white spots on her feet though. She states that her legs are warm to touch and that she has good sensation on both lower extremities. Advised Brooke [name] to call with any signs of decreased sensation or change in temperature to her lower extremities. Brooke [name] does state that she can stand and ambulate some; but she has been doing minimal ambulation due to pain. Brooke [name] and her husband verbalized understanding and know to call for any further questions and concerns.

## 2014-01-21 ENCOUNTER — Other Ambulatory Visit: Payer: Self-pay | Admitting: Emergency Medicine

## 2014-01-21 DIAGNOSIS — G893 Neoplasm related pain (acute) (chronic): Secondary | ICD-10-CM

## 2014-01-21 DIAGNOSIS — C50512 Malignant neoplasm of lower-outer quadrant of left female breast: Secondary | ICD-10-CM

## 2014-01-21 DIAGNOSIS — N870 Mild cervical dysplasia: Secondary | ICD-10-CM

## 2014-01-21 DIAGNOSIS — C50919 Malignant neoplasm of unspecified site of unspecified female breast: Secondary | ICD-10-CM

## 2014-01-21 DIAGNOSIS — C7951 Secondary malignant neoplasm of bone: Principal | ICD-10-CM

## 2014-01-24 NOTE — Progress Notes (Signed)
ID: Brooke [name]   DOB: 1950-04-08  MR#: [id number]  BVQ#:[id number]  PCP: Irven Shelling, MD GYN: SU:  OTHER MD: Thea Silversmith, Gaynelle Arabian, [name] [phone]), Willa Rough DDS  CHIEF COMPLAINT: stage IV breast cancer CURRENT THERAPY: zolendronate, capecitabine  BREAST CANCER HISTORY: From the January 2011 summary:  Brooke [name] had screening mammography in August 04, 2007 which showed no specific mammographic evidence of malignancy, but she reported a left breast lump.  Accordingly, she was brought back on August 11, 2007 for mammography and ultrasonography.  There was indeed an obscured mass in the upper left breast which by ultrasound appeared to be a simple cyst.    Screening mammogram on August 04, 2008 showed in addition to dense breasts, again a possible mass in the left breast.  She had diagnostic mammography August 06, 2008 and additional views in addition to very dense breasts did not show any persistent mass or distortion.  Furthermore, Dr. Owens Shark was not able to palpate any abnormality in the lateral portion of the left breast.  Ultrasound showed normal appearing fibroglandular tissue in the area in question.    More recently, about September, the patient felt again something like a lump in the left breast and she had some pain associated with this.  This was initially felt simply to be mastalgia and was treated as such, but as it persisted, on December 23rd, the patient had left diagnostic mammography and ultrasonography.  There was a focally dense area in the left breast with very minimal distortion corresponding to the area of palpable abnormality. The ultrasound showed an irregular ill-defined, hypoechoic area with distal shadowing, measuring approximately 1 cm corresponding to the patient's palpable abnormality.    With this information, the patient was brought back for ultrasound-guided core biopsy and this was performed December 28th. The final pathology  ([id number]) showed an invasive ductal carcinoma which [name] to be Grade 2.  There was no evidence of angiolymphatic invasion in the material submitted which measured 1.5 cm maximally.  In addition to the mass in the breast, a lymph node in the left axilla was biopsied and was likewise positive.  The tumor was ER positive at 89%, PR positive at 34% with an elevated MIB-1 at 44% and Her-2 negative with a ratio of 1.13.    Her subsequent history is as detailed below.  INTERVAL HISTORY: Brooke [name] returns for follow up of her metastatic breast cancer, accompanied by her husband, Brooke [name]. She completed her 8th cycle of full dose capecitabine on 01/24/2013. She continues to omit one dose in the fourth day of each cycle. She says otherwise she couldn't get through the week. The main symptom is fatigue and possibly a little confusion, although she is on other medicines which seem to me to be more likely the cause of that. She is not having no diarrhea from the capecitabine and also no constipation from the narcotics. She is on a bowel prophylaxis regimen (Merrill X daily and stool softeners twice a day when she is not on the capecitabine). She also has had no palmar or plantar erythrodysesthesia or mouth sores. She obtains the capecitabine at a very reasonable cost. --Her son came to visit the first week in December and her daughter Brooke [name] came for 2 weeks around Christmas. New Year's was quiet.  REVIEW OF SYSTEMS: Brooke [name] continues to hurt in her left leg, lower back, upper back, chest, neck, and of course her right arm. She is no longer wearing the sling on the right  arm, where she has the fracture, and can move that are much better. She continues to rehabilitation that. She has occasional nausea from the capecitabine, easily controlled on Compazine. She is developed "white spots" on her legs, which she tends to notice in the morning. This sounds like livedo by her description. This was not apparent on today's exam.  She has no problems with urinary control. She would like to try to walk but Brooke [name] does not feel she can do it because of her prior problems with falling and her continuing to be weak and wobbly. She has a wheelchair she stays in most of the day and that is working well for the time being. A detailed review of systems otherwise was stable.  PAST MEDICAL HISTORY: Past Medical History  Diagnosis Date  . Hx Breast Cancer, IDC, Stahe III, receptor + 10/20/2010    left breast  . Breast cancer December 2010    invasive ductal and invasive lobular; bilateral mastectomy and radiation  . Mitral valve prolapse   . History of recurrent UTIs   . Radiation 07/26/09-09/08/09    left breast  . Fracture, pelvis closed 11/2012    both hipsand number 7 rib, 15 rounds radiation  . Metastasis to bone   1. Remote migraines, currently inactive.   2. History of prior diagnosis of fibromyalgia made at [facility] and currently not active (she was treated with trimethoprim and nortriptyline for one year for this).    3. History of recurrent UTIs.   4. History of benign left breast cyst aspirated in 2004 at [facility] under Dr. Melene Plan.   5. History of tonsillectomy and adenoidectomy.   6. History of right LASEK surgery. 7. History of bilateral reduction mammoplasties in 1994.   8. History of mitral valve prolapse diagnosed in 1984.  PAST SURGICAL HISTORY: Past Surgical History  Procedure Laterality Date  . Mastectomy Bilateral 2011    left breast cancer  . Lasik    . Reduction mammaplasty Bilateral 1994  . Tonsillectomy and adenoidectomy    . Aspiration of left breast Left 2004    [facility]/Dr.Teo  . Bone biopsy  02/19/13    mets from breast cancer-receptors results not back    FAMILY HISTORY Family History  Problem Relation Age of Onset  . Lung cancer Father 38    smoker for 25 years  . Heart disease Maternal Grandmother   . Heart disease Maternal Grandfather    . Diabetes type I Mother   . Diabetes type I Sister   . Diabetes type I Sister   . Heart disease Maternal Uncle   . Uterine cancer Paternal Grandmother 64  . Breast cancer Other 80    paternal great grandmother  [name] died at the age of 51 from lung cancer.  He had been a remote smoker and had been in [employer] with a question of possible asbestos exposure.  [name] died at the age of 81 in the setting of Alzheimer's, coronary artery disease, diabetes and hypertension.  The patient had two sisters.  One died from complications of diabetes.  The other one is alive with diabetes.    GYNECOLOGIC HISTORY: She is GX P1.  First pregnancy to term at age 37.  The patient's last period was in 2008.  She took hormone replacement for about 18 months until mid-2008.  She had no complications from that treatment.    SOCIAL HISTORY: (Updated October 2014) She used  to be a Arts development officer for [employer]  They used to live in the [place] area. 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  Her husband, Brooke [name], retired October 2012.  He was a Arts development officer for 28 years.  Their son, [name], is currently with the [employer] at the [employer] in [place]. [name].  She attends [employer].   ADVANCED DIRECTIVES: in place  HEALTH MAINTENANCE: (Updated 11/19/2012) History  Substance Use Topics  . Smoking status: Former Smoker -- 1.00 packs/day for 11 years    Quit date: 01/23/1984  . Smokeless tobacco: Never Used  . Alcohol Use: No     Colonoscopy: 2010  PAP: Jan 2014  Bone density: July 2014, osteopenia  Lipid panel: per Dr Valentina Lucks  Allergies  Allergen Reactions  . Sulfa Antibiotics Shortness Of Breath  . Codeine Nausea And Vomiting    Tolerates oxycodone if given with prochlorperazine for nausea  . Morphine And Related Nausea And Vomiting    Per patient possibility that the IR  morphine was causing the vomiting   Current Outpatient Prescriptions on File Prior to Visit  Medication Sig Dispense Refill  . Calcium Citrate (CITRACAL PO) Take 3 tablets by mouth daily.    . capecitabine (XELODA) 500 MG tablet Take 1,500 mg by mouth See admin instructions. Take twice daily after meals for 7 days then stop for 7 days    . cholecalciferol (VITAMIN D) 400 UNITS TABS tablet Take 400 Units by mouth daily. Patient reportd the Vitamin E should be vitamin D with same dose    . diphenhydrAMINE (BENADRYL) 25 mg capsule Take 25 mg by mouth at bedtime as needed for sleep (sleep).     . fentaNYL (DURAGESIC - DOSED MCG/HR) 25 MCG/HR patch Place 1 patch (25 mcg total) onto the skin every 3 (three) days. 10 patch 0  . gabapentin (NEURONTIN) 300 MG capsule TAKE 1 CAPSULE (300 MG TOTAL) BY MOUTH AT BEDTIME. 90 capsule 0  . HYDROmorphone (DILAUDID) 4 MG tablet Take 1-2 tablets (4-8 mg total) by mouth every 4 (four) hours as needed for severe pain (pain). 120 tablet 0  . ondansetron (ZOFRAN) 8 MG tablet Take 8 mg by mouth 2 (two) times daily as needed for nausea or vomiting (nausea & vomiting).    Vladimir Faster Glycol-Propyl Glycol (SYSTANE) 0.4-0.3 % SOLN Apply 1 drop to eye 2 (two) times daily as needed (dry eyes).    . polyethylene glycol (MIRALAX / GLYCOLAX) packet Take 17 g by mouth daily as needed for mild constipation.    [name] Kitchen PRESCRIPTION MEDICATION Chemo [facility]    . prochlorperazine (COMPAZINE) 5 MG tablet Take 1 tablet (5 mg total) by mouth every 6 (six) hours as needed for nausea or vomiting. 120 tablet 2  . venlafaxine XR (EFFEXOR-XR) 75 MG 24 hr capsule TAKE 2 CAPSULES (150MG)    DAILY WITH BREAKFAST 180 capsule 0   No current facility-administered medications on file prior to visit.    OBJECTIVE: Middle-aged white woman examined in a wheelchair  Filed Vitals:   01/25/14 1213  BP: 146/86  Pulse: 88  Temp: 98.5 F (36.9 C)  Resp: 18  Body mass index is 32.05 kg/(m^2).  ECOG  2  Sclerae unicteric, pupils equal and reactive Oropharynx clear and moist-- no thrush or other lesions No cervical or supraclavicular adenopathy Lungs no rales or rhonchi Heart regular rate and rhythm Abd soft, obese,  nontender, positive bowel sounds MSK kyphosis, no upper extremity lymphedema Neuro: nonfocal, well oriented, appropriate affect Breasts: Deferred   LAB RESULTS:  Lab Results  Component Value Date   WBC 4.1 01/25/2014   NEUTROABS 2.8 01/25/2014   HGB 11.9 01/25/2014   HCT 37.0 01/25/2014   MCV 97.8 01/25/2014   PLT 195 01/25/2014      Chemistry      Component Value Date/Time   NA 136 01/25/2014 1126   NA 141 03/29/2013 0350   NA 142 06/26/2011 0728   K 4.4 01/25/2014 1126   K 3.7 03/29/2013 0350   K 4.6 06/26/2011 0728   CL 111 03/29/2013 0350   CL 105 01/07/2012 1347   CL 102 06/26/2011 0728   CO2 24 01/25/2014 1126   CO2 18* 03/29/2013 0350   CO2 30 06/26/2011 0728   BUN 9.7 01/25/2014 1126   BUN 10 03/29/2013 0350   BUN 12 06/26/2011 0728   CREATININE 0.9 01/25/2014 1126   CREATININE 0.72 03/29/2013 0350   CREATININE 1.1 06/26/2011 0728      Component Value Date/Time   CALCIUM 9.2 01/25/2014 1126   CALCIUM 7.8* 03/29/2013 0350   CALCIUM 9.1 06/26/2011 0728   ALKPHOS 158* 01/25/2014 1126   ALKPHOS 91 04/30/2013 1301   AST 33 01/25/2014 1126   AST 67* 04/30/2013 1301   ALT 19 01/25/2014 1126   ALT 93* 04/30/2013 1301   BILITOT 0.43 01/25/2014 1126   BILITOT 0.4 04/30/2013 1301     Results for GINNETTE, GATES (MRN [id number]) as of 01/25/2014 12:27  Ref. Range 10/12/2013 13:29 10/30/2013 14:31 11/30/2013 11:02 12/28/2013 11:05 01/11/2014 12:40  CA 27.29 Latest Range: 0-39 U/mL 342 (H) 337 (H) 383 (H) 328 (H) 306 (H)  CEA Latest Range: 0.0-5.0 ng/mL  85.5 (H) 93.6 (H) 79.0 (H) 70.4 (H)  Results for NEKO, MCGEEHAN (MRN [id number]) as of 01/25/2014 12:27  Ref. Range 11/30/2013 11:02 12/11/2013 13:21 12/28/2013 11:05 01/11/2014 12:40 01/25/2014  11:26  Alkaline Phosphatase Latest Range: 39-117 U/L 192 (H) 286 (H) 238 (H) 200 (H) 158 (H)    STUDIES: No results found.  ASSESSMENT: 64 y.o. [employer] woman   (1)  status post Left breast and Left axillary lymph node biopsy Dec 2010 for a cT3 pN1 (stage IIIA) invasive ductal carcinoma, grade 2, estrogen and progesterone receptor positive, HER-2 negative, with an MIB-1 of 44%;   (2)  treated neoadjuvantly with dose dense cyclophosphamide and doxorubicin x4 followed by weekly paclitaxel x7  (3) s/p bilateral mastectomies May 2011. She had a residual 1.5 cm tumor, and one of 8 sampled lymph nodes was positive [ypT1c ypN1]. Margins were negative.   (4)  She completed post-mastectomy radiation in August of 2011   (5) on letrozole between August 2011 and October 2014  (6) PET scan 11/05/2012 obtained to evaluate right chest wall pain documents bony metastatic disease to the right seventh rib, L1, and the right acetabulum with an associated pathologic fracture through the anterior cortex of the acetabulum measuring 3.2 cm. There was no evidence of extra osseous metastatic disease.  (7) starting 11/05/2012 a received fulvestrant, 500 mg monthly, in addition to monthly infusions of zoledronic acid. The fulvestrant was discontinued May 2015 with evidence of progression  (8) Right rib, left hip and right hip were all treated to 37.5 Gy in 15 fractions at 2.5 Gy per fraction completed 12/17/2012  (9) 02/19/2013 bone marrow biopsy confirms metastatic disease to bone. HER-2 was not done because of decaalcification  of specimen. Estrogen and progesterone receptors were read as negative. This i was felt to be possibly discordant.  (10) patient's tumor carries a PIK3 mutation as documented at [facility]  (11) L ribcage pain: s/p palliative XRT completed 06/16/2013  (12) declined participation in a capecitabine plus PIK 3 inhibitor at [facility]; started capecitabine alone  06/27/2013, discontinued after one week because of side effects;  (a) resumed 08/17/2013 at 500 mg twice a day one-week on one-week off  (b) dose being increased gradually, received 1000 mg BID with cycle starting 09/28/2013  (c) full dose of 1580m BID started 10/12/13  (13) radiation to Right sacrum/ lower lumbar spine 06/10 to 06/23   (14) non-displaced fracture proximal right humerus secondary to a fall November 2015   PLAN:  PFraser Dinappears to be making progress as far as her cancer is concerned. Her alkaline phosphatase and her tumor markers are dropping, even though slowly. She is tolerating the capecitabine well. She only misses one dose of the 14 doses scheduled.  What is not improving is her functional status. Unfortunately she remains in quite a bit of pain. She is taking between 20 and 22 mg of dilated daily. She is managing constipation well. She is now in a wheelchair pretty much all of the time because her husband tells me when she is dressed as done up she is very wobbly and of course she already fell and fractured her arm. We have made a referral to a pain clinic to see if an epidural shots might be helpful but so far have not been able to obtain an appointment for her..   At this point the plan continues to be for capecitabine at 1.5 g twice daily 7 days on and 7 days off. We will continue to see her on an every 2 week schedule and before she sees me in mid February she will have a repeat PET scan. Those orders are in place.  She knows to call for any problems that may develop before her next visit here.  MChauncey Cruel MD  01/25/2014

## 2014-01-25 ENCOUNTER — Ambulatory Visit (HOSPITAL_BASED_OUTPATIENT_CLINIC_OR_DEPARTMENT_OTHER): Payer: Medicare Other

## 2014-01-25 ENCOUNTER — Telehealth: Payer: Self-pay | Admitting: *Deleted

## 2014-01-25 ENCOUNTER — Other Ambulatory Visit (HOSPITAL_BASED_OUTPATIENT_CLINIC_OR_DEPARTMENT_OTHER): Payer: Medicare Other

## 2014-01-25 ENCOUNTER — Ambulatory Visit (HOSPITAL_BASED_OUTPATIENT_CLINIC_OR_DEPARTMENT_OTHER): Payer: Medicare Other | Admitting: Oncology

## 2014-01-25 ENCOUNTER — Other Ambulatory Visit: Payer: Self-pay | Admitting: *Deleted

## 2014-01-25 ENCOUNTER — Ambulatory Visit: Payer: Medicare Other

## 2014-01-25 VITALS — BP 146/86 | HR 88 | Temp 98.5°F | Resp 18 | Ht 64.0 in | Wt 186.8 lb

## 2014-01-25 DIAGNOSIS — R1115 Cyclical vomiting syndrome unrelated to migraine: Secondary | ICD-10-CM

## 2014-01-25 DIAGNOSIS — C7951 Secondary malignant neoplasm of bone: Secondary | ICD-10-CM

## 2014-01-25 DIAGNOSIS — N870 Mild cervical dysplasia: Secondary | ICD-10-CM

## 2014-01-25 DIAGNOSIS — C50912 Malignant neoplasm of unspecified site of left female breast: Secondary | ICD-10-CM

## 2014-01-25 DIAGNOSIS — M79602 Pain in left arm: Secondary | ICD-10-CM

## 2014-01-25 DIAGNOSIS — C50512 Malignant neoplasm of lower-outer quadrant of left female breast: Secondary | ICD-10-CM

## 2014-01-25 DIAGNOSIS — G893 Neoplasm related pain (acute) (chronic): Secondary | ICD-10-CM

## 2014-01-25 DIAGNOSIS — R0781 Pleurodynia: Secondary | ICD-10-CM

## 2014-01-25 DIAGNOSIS — C50919 Malignant neoplasm of unspecified site of unspecified female breast: Secondary | ICD-10-CM

## 2014-01-25 DIAGNOSIS — M25551 Pain in right hip: Secondary | ICD-10-CM

## 2014-01-25 LAB — COMPREHENSIVE METABOLIC PANEL (CC13)
ALBUMIN: 3.4 g/dL — AB (ref 3.5–5.0)
ALT: 19 U/L (ref 0–55)
AST: 33 U/L (ref 5–34)
Alkaline Phosphatase: 158 U/L — ABNORMAL HIGH (ref 40–150)
Anion Gap: 8 mEq/L (ref 3–11)
BILIRUBIN TOTAL: 0.43 mg/dL (ref 0.20–1.20)
BUN: 9.7 mg/dL (ref 7.0–26.0)
CO2: 24 mEq/L (ref 22–29)
CREATININE: 0.9 mg/dL (ref 0.6–1.1)
Calcium: 9.2 mg/dL (ref 8.4–10.4)
Chloride: 104 mEq/L (ref 98–109)
EGFR: 71 mL/min/{1.73_m2} — ABNORMAL LOW (ref >90)
GLUCOSE: 109 mg/dL (ref 70–140)
Potassium: 4.4 mEq/L (ref 3.5–5.1)
Sodium: 136 mEq/L (ref 136–145)
Total Protein: 6.3 g/dL — ABNORMAL LOW (ref 6.4–8.3)

## 2014-01-25 LAB — CBC WITH DIFFERENTIAL/PLATELET
BASO%: 0.6 % (ref 0.0–2.0)
Basophils Absolute: 0 10*3/uL (ref 0.0–0.1)
EOS%: 2.5 % (ref 0.0–7.0)
Eosinophils Absolute: 0.1 10*3/uL (ref 0.0–0.5)
HEMATOCRIT: 37 % (ref 34.8–46.6)
HGB: 11.9 g/dL (ref 11.6–15.9)
LYMPH%: 15.3 % (ref 14.0–49.7)
MCH: 31.4 pg (ref 25.1–34.0)
MCHC: 32.1 g/dL (ref 31.5–36.0)
MCV: 97.8 fL (ref 79.5–101.0)
MONO#: 0.6 10*3/uL (ref 0.1–0.9)
MONO%: 13.6 % (ref 0.0–14.0)
NEUT#: 2.8 10*3/uL (ref 1.5–6.5)
NEUT%: 68 % (ref 38.4–76.8)
Platelets: 195 10*3/uL (ref 145–400)
RBC: 3.78 10*6/uL (ref 3.70–5.45)
RDW: 17.2 % — ABNORMAL HIGH (ref 11.2–14.5)
WBC: 4.1 10*3/uL (ref 3.9–10.3)
lymph#: 0.6 10*3/uL — ABNORMAL LOW (ref 0.9–3.3)

## 2014-01-25 MED ORDER — DENOSUMAB 120 MG/1.7ML ~~LOC~~ SOLN
120.0000 mg | Freq: Once | SUBCUTANEOUS | Status: AC
  Administered 2014-01-25: 120 mg via SUBCUTANEOUS
  Filled 2014-01-25: qty 1.7

## 2014-01-25 MED ORDER — ALPRAZOLAM ER 1 MG PO TB24: 1.0000 mg | ORAL_TABLET | Freq: Every day | ORAL | Status: AC

## 2014-01-25 MED ORDER — ALPRAZOLAM ER 1 MG PO TB24
1.0000 mg | ORAL_TABLET | Freq: Every day | ORAL | Status: DC
Start: 2014-01-25 — End: 2014-01-25

## 2014-01-25 NOTE — Addendum Note (Signed)
Addended by: Laureen Abrahams on: 01/25/2014 05:30 PM   Modules accepted: Orders, Medications

## 2014-01-25 NOTE — Patient Instructions (Signed)
Denosumab injection What is this medicine? DENOSUMAB (den oh sue mab) slows bone breakdown. Prolia is used to treat osteoporosis in women after menopause and in men. Xgeva is used to prevent bone fractures and other bone problems caused by cancer bone metastases. Xgeva is also used to treat giant cell tumor of the bone. This medicine may be used for other purposes; ask your health care provider or pharmacist if you have questions. COMMON BRAND NAME(S): Prolia, XGEVA What should I tell my health care provider before I take this medicine? They need to know if you have any of these conditions: -dental disease -eczema -infection or history of infections -kidney disease or on dialysis -low blood calcium or vitamin D -malabsorption syndrome -scheduled to have surgery or tooth extraction -taking medicine that contains denosumab -thyroid or parathyroid disease -an unusual reaction to denosumab, other medicines, foods, dyes, or preservatives -pregnant or trying to get pregnant -breast-feeding How should I use this medicine? This medicine is for injection under the skin. It is given by a health care professional in a hospital or clinic setting. If you are getting Prolia, a special MedGuide will be given to you by the pharmacist with each prescription and refill. Be sure to read this information carefully each time. For Prolia, talk to your pediatrician regarding the use of this medicine in children. Special care may be needed. For Xgeva, talk to your pediatrician regarding the use of this medicine in children. While this drug may be prescribed for children as young as 13 years for selected conditions, precautions do apply. Overdosage: If you think you've taken too much of this medicine contact a poison control center or emergency room at once. Overdosage: If you think you have taken too much of this medicine contact a poison control center or emergency room at once. NOTE: This medicine is only for  you. Do not share this medicine with others. What if I miss a dose? It is important not to miss your dose. Call your doctor or health care professional if you are unable to keep an appointment. What may interact with this medicine? Do not take this medicine with any of the following medications: -other medicines containing denosumab This medicine may also interact with the following medications: -medicines that suppress the immune system -medicines that treat cancer -steroid medicines like prednisone or cortisone This list may not describe all possible interactions. Give your health care provider a list of all the medicines, herbs, non-prescription drugs, or dietary supplements you use. Also tell them if you smoke, drink alcohol, or use illegal drugs. Some items may interact with your medicine. What should I watch for while using this medicine? Visit your doctor or health care professional for regular checks on your progress. Your doctor or health care professional may order blood tests and other tests to see how you are doing. Call your doctor or health care professional if you get a cold or other infection while receiving this medicine. Do not treat yourself. This medicine may decrease your body's ability to fight infection. You should make sure you get enough calcium and vitamin D while you are taking this medicine, unless your doctor tells you not to. Discuss the foods you eat and the vitamins you take with your health care professional. See your dentist regularly. Brush and floss your teeth as directed. Before you have any dental work done, tell your dentist you are receiving this medicine. Do not become pregnant while taking this medicine or for 5 months after stopping   it. Women should inform their doctor if they wish to become pregnant or think they might be pregnant. There is a potential for serious side effects to an unborn child. Talk to your health care professional or pharmacist for more  information. What side effects may I notice from receiving this medicine? Side effects that you should report to your doctor or health care professional as soon as possible: -allergic reactions like skin rash, itching or hives, swelling of the face, lips, or tongue -breathing problems -chest pain -fast, irregular heartbeat -feeling faint or lightheaded, falls -fever, chills, or any other sign of infection -muscle spasms, tightening, or twitches -numbness or tingling -skin blisters or bumps, or is dry, peels, or red -slow healing or unexplained pain in the mouth or jaw -unusual bleeding or bruising Side effects that usually do not require medical attention (Report these to your doctor or health care professional if they continue or are bothersome.): -muscle pain -stomach upset, gas This list may not describe all possible side effects. Call your doctor for medical advice about side effects. You may report side effects to FDA at 1-800-FDA-1088. Where should I keep my medicine? This medicine is only given in a clinic, doctor's office, or other health care setting and will not be stored at home. NOTE: This sheet is a summary. It may not cover all possible information. If you have questions about this medicine, talk to your doctor, pharmacist, or health care provider.  2015, Elsevier/Gold Standard. (2011-07-09 12:37:47)  

## 2014-01-25 NOTE — Telephone Encounter (Signed)
Referral and records [facility] to [facility] at [phone] attention Inez Catalina - NP Coordinator.

## 2014-01-26 LAB — CANCER ANTIGEN 27.29: CA 27.29: 321 U/mL — ABNORMAL HIGH (ref 0–39)

## 2014-01-26 LAB — CEA: CEA: 68.2 ng/mL — AB (ref 0.0–5.0)

## 2014-02-08 ENCOUNTER — Ambulatory Visit (HOSPITAL_BASED_OUTPATIENT_CLINIC_OR_DEPARTMENT_OTHER): Payer: Medicare Other | Admitting: Nurse Practitioner

## 2014-02-08 ENCOUNTER — Encounter: Payer: Self-pay | Admitting: Nurse Practitioner

## 2014-02-08 ENCOUNTER — Other Ambulatory Visit (HOSPITAL_BASED_OUTPATIENT_CLINIC_OR_DEPARTMENT_OTHER): Payer: Medicare Other

## 2014-02-08 ENCOUNTER — Telehealth: Payer: Self-pay | Admitting: Oncology

## 2014-02-08 VITALS — BP 156/90 | HR 93 | Temp 99.1°F | Resp 18 | Ht 64.0 in | Wt 187.8 lb

## 2014-02-08 DIAGNOSIS — Z170 Estrogen receptor positive status [ER+]: Secondary | ICD-10-CM

## 2014-02-08 DIAGNOSIS — C50912 Malignant neoplasm of unspecified site of left female breast: Secondary | ICD-10-CM

## 2014-02-08 DIAGNOSIS — M549 Dorsalgia, unspecified: Secondary | ICD-10-CM

## 2014-02-08 DIAGNOSIS — M79603 Pain in arm, unspecified: Secondary | ICD-10-CM

## 2014-02-08 DIAGNOSIS — M79601 Pain in right arm: Secondary | ICD-10-CM

## 2014-02-08 DIAGNOSIS — C7951 Secondary malignant neoplasm of bone: Secondary | ICD-10-CM

## 2014-02-08 DIAGNOSIS — M79605 Pain in left leg: Secondary | ICD-10-CM

## 2014-02-08 DIAGNOSIS — C50919 Malignant neoplasm of unspecified site of unspecified female breast: Secondary | ICD-10-CM

## 2014-02-08 DIAGNOSIS — C50512 Malignant neoplasm of lower-outer quadrant of left female breast: Secondary | ICD-10-CM

## 2014-02-08 DIAGNOSIS — C773 Secondary and unspecified malignant neoplasm of axilla and upper limb lymph nodes: Secondary | ICD-10-CM

## 2014-02-08 LAB — CBC WITH DIFFERENTIAL/PLATELET
BASO%: 0.7 % (ref 0.0–2.0)
Basophils Absolute: 0 10*3/uL (ref 0.0–0.1)
EOS%: 3.4 % (ref 0.0–7.0)
Eosinophils Absolute: 0.1 10*3/uL (ref 0.0–0.5)
HEMATOCRIT: 35.4 % (ref 34.8–46.6)
HEMOGLOBIN: 11.6 g/dL (ref 11.6–15.9)
LYMPH%: 18 % (ref 14.0–49.7)
MCH: 31.8 pg (ref 25.1–34.0)
MCHC: 32.7 g/dL (ref 31.5–36.0)
MCV: 97.3 fL (ref 79.5–101.0)
MONO#: 0.3 10*3/uL (ref 0.1–0.9)
MONO%: 9.3 % (ref 0.0–14.0)
NEUT#: 2.5 10*3/uL (ref 1.5–6.5)
NEUT%: 68.6 % (ref 38.4–76.8)
PLATELETS: 184 10*3/uL (ref 145–400)
RBC: 3.64 10*6/uL — ABNORMAL LOW (ref 3.70–5.45)
RDW: 17 % — ABNORMAL HIGH (ref 11.2–14.5)
WBC: 3.6 10*3/uL — AB (ref 3.9–10.3)
lymph#: 0.7 10*3/uL — ABNORMAL LOW (ref 0.9–3.3)

## 2014-02-08 LAB — COMPREHENSIVE METABOLIC PANEL (CC13)
ALK PHOS: 127 U/L (ref 40–150)
ALT: 15 U/L (ref 0–55)
ANION GAP: 9 meq/L (ref 3–11)
AST: 24 U/L (ref 5–34)
Albumin: 3.4 g/dL — ABNORMAL LOW (ref 3.5–5.0)
BILIRUBIN TOTAL: 0.36 mg/dL (ref 0.20–1.20)
BUN: 9.8 mg/dL (ref 7.0–26.0)
CALCIUM: 8.7 mg/dL (ref 8.4–10.4)
CO2: 23 meq/L (ref 22–29)
CREATININE: 0.9 mg/dL (ref 0.6–1.1)
Chloride: 106 mEq/L (ref 98–109)
EGFR: 74 mL/min/{1.73_m2} — AB (ref >90)
GLUCOSE: 136 mg/dL (ref 70–140)
POTASSIUM: 4 meq/L (ref 3.5–5.1)
SODIUM: 139 meq/L (ref 136–145)
TOTAL PROTEIN: 6.2 g/dL — AB (ref 6.4–8.3)

## 2014-02-08 NOTE — Progress Notes (Signed)
ID: Brooke [name]   DOB: 02-Jan-1951  MR#: [id number]  PIR#:[id number]  PCP: Irven Shelling, MD GYN: SU:  OTHER MD: Thea Silversmith, Gaynelle Arabian, [name] [phone]), Willa Rough DDS  CHIEF COMPLAINT: stage IV breast cancer CURRENT THERAPY: zolendronate, capecitabine  BREAST CANCER HISTORY: From the January 2011 summary:  Brooke [name] had screening mammography in August 04, 2007 which showed no specific mammographic evidence of malignancy, but she reported a left breast lump.  Accordingly, she was brought back on August 11, 2007 for mammography and ultrasonography.  There was indeed an obscured mass in the upper left breast which by ultrasound appeared to be a simple cyst.    Screening mammogram on August 04, 2008 showed in addition to dense breasts, again a possible mass in the left breast.  She had diagnostic mammography August 06, 2008 and additional views in addition to very dense breasts did not show any persistent mass or distortion.  Furthermore, Dr. Owens Shark was not able to palpate any abnormality in the lateral portion of the left breast.  Ultrasound showed normal appearing fibroglandular tissue in the area in question.    More recently, about September, the patient felt again something like a lump in the left breast and she had some pain associated with this.  This was initially felt simply to be mastalgia and was treated as such, but as it persisted, on December 23rd, the patient had left diagnostic mammography and ultrasonography.  There was a focally dense area in the left breast with very minimal distortion corresponding to the area of palpable abnormality. The ultrasound showed an irregular ill-defined, hypoechoic area with distal shadowing, measuring approximately 1 cm corresponding to the patient's palpable abnormality.    With this information, the patient was brought back for ultrasound-guided core biopsy and this was performed December 28th. 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  There was no evidence of angiolymphatic invasion in the material submitted which measured 1.5 cm maximally.  In addition to the mass in the breast, a lymph node in the left axilla was biopsied and was likewise positive.  The tumor was ER positive at 89%, PR positive at 34% with an elevated MIB-1 at 44% and Her-2 negative with a ratio of 1.13.    Her subsequent history is as detailed below.  INTERVAL HISTORY: Brooke [name] returns for follow up of her metastatic breast cancer, accompanied by her husband, Brooke [name]. She completed her 9th cycle of full dose capecitabine on 02/07/2013. She continues to omit one dose in the fourth day of each cycle due to tolerance issues. She was more nauseous with this cycle and it happened earlier than usual. The PRN antiemetics work well to control this. She denies palmar/planta erythrodesthesia, diarrhea, or mouth sores.   REVIEW OF SYSTEMS: Brooke [name] denies fevers or chills. Pain is still an issue. She takes 16-42m dilaudid daily for left leg, back, and right arm pain. She also wears 220m fentanyl patches. She has an appointment with a pain management clinic later this week to see if epidural shots are an option. She is not constipated from the narcotic use. Her pain starts at a 9, but the meds bring it down to 4 with is reasonable, and leaves her alert enough throughout the day. She still has her husband help her back and forth to the bathroom because of balance and falling concerns. She is also fairly fatigued and winded after taking care of basic hygiene tasks in the  morning. She denies chest, pain, cough, or palpitations. She has no unusual headaches, dizziness, or visual changes. A detailed review of systems is otherwise stable.   PAST MEDICAL HISTORY: Past Medical History  Diagnosis Date  . Hx Breast Cancer, IDC, Stahe III, receptor + 10/20/2010    left breast  . Breast cancer December 2010     invasive ductal and invasive lobular; bilateral mastectomy and radiation  . Mitral valve prolapse   . History of recurrent UTIs   . Radiation 07/26/09-09/08/09    left breast  . Fracture, pelvis closed 11/2012    both hipsand number 7 rib, 15 rounds radiation  . Metastasis to bone   1. Remote migraines, currently inactive.   2. History of prior diagnosis of fibromyalgia made at [facility] and currently not active (she was treated with trimethoprim and nortriptyline for one year for this).    3. History of recurrent UTIs.   4. History of benign left breast cyst aspirated in 2004 at [facility] under Dr. Melene Plan.   5. History of tonsillectomy and adenoidectomy.   6. History of right LASEK surgery. 7. History of bilateral reduction mammoplasties in 1994.   8. History of mitral valve prolapse diagnosed in 1984.  PAST SURGICAL HISTORY: Past Surgical History  Procedure Laterality Date  . Mastectomy Bilateral 2011    left breast cancer  . Lasik    . Reduction mammaplasty Bilateral 1994  . Tonsillectomy and adenoidectomy    . Aspiration of left breast Left 2004    [facility]/Dr.Teo  . Bone biopsy  02/19/13    mets from breast cancer-receptors results not back    FAMILY HISTORY Family History  Problem Relation Age of Onset  . Lung cancer Father 17    smoker for 25 years  . Heart disease Maternal Grandmother   . Heart disease Maternal Grandfather   . Diabetes type I Mother   . Diabetes type I Sister   . Diabetes type I Sister   . Heart disease Maternal Uncle   . Uterine cancer Paternal Grandmother 50  . Breast cancer Other 14    paternal great grandmother  [name] died at the age of 71 from lung cancer.  He had been a remote smoker and had been in [employer] with a question of possible asbestos exposure.  [name] died at the age of 96 in the setting of Alzheimer's, coronary artery disease, diabetes and hypertension.   The patient had two sisters.  One died from complications of diabetes.  The other one is alive with diabetes.    GYNECOLOGIC HISTORY: She is GX P1.  First pregnancy to term at age 31.  The patient's last period was in 2008.  She took hormone replacement for about 18 months until mid-2008.  She had no complications from that treatment.    SOCIAL HISTORY: (Updated October 2014) She used to be a Arts development officer for [employer]  They used to live in the [place] area. 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  Her husband, Brooke [name], retired October 2012.  He was a Arts development officer for 28 years.  Their son, [name], is currently with the [employer] at the [employer] in [place]. [name].  She attends [employer].   ADVANCED DIRECTIVES: in place  HEALTH MAINTENANCE: (Updated 11/19/2012) History  Substance Use Topics  . Smoking status: Former Smoker --  1.00 packs/day for 11 years    Quit date: 01/23/1984  . Smokeless tobacco: Never Used  . Alcohol Use: No     Colonoscopy: 2010  PAP: Jan 2014  Bone density: July 2014, osteopenia  Lipid panel: per Dr Valentina Lucks  Allergies  Allergen Reactions  . Sulfa Antibiotics Shortness Of Breath  . Codeine Nausea And Vomiting    Tolerates oxycodone if given with prochlorperazine for nausea  . Morphine And Related Nausea And Vomiting    Per patient possibility that the IR morphine was causing the vomiting   Current Outpatient Prescriptions on File Prior to Visit  Medication Sig Dispense Refill  . ALPRAZolam (XANAX XR) 1 MG 24 hr tablet Take 1 tablet (1 mg total) by mouth daily. 90 tablet 0  . Calcium Citrate (CITRACAL PO) Take 3 tablets by mouth daily.    . capecitabine (XELODA) 500 MG tablet Take 1,500 mg by mouth See admin instructions. Take twice daily after meals for 7 days then stop for 7 days    . cholecalciferol (VITAMIN D) 400 UNITS TABS tablet Take 400 Units by  mouth daily. Patient reportd the Vitamin E should be vitamin D with same dose    . diphenhydrAMINE (BENADRYL) 25 mg capsule Take 25 mg by mouth at bedtime as needed for sleep (sleep).     . fentaNYL (DURAGESIC - DOSED MCG/HR) 25 MCG/HR patch Place 1 patch (25 mcg total) onto the skin every 3 (three) days. 10 patch 0  . gabapentin (NEURONTIN) 300 MG capsule TAKE 1 CAPSULE (300 MG TOTAL) BY MOUTH AT BEDTIME. 90 capsule 0  . HYDROmorphone (DILAUDID) 4 MG tablet Take 1-2 tablets (4-8 mg total) by mouth every 4 (four) hours as needed for severe pain (pain). 120 tablet 0  . ondansetron (ZOFRAN) 8 MG tablet Take 8 mg by mouth 2 (two) times daily as needed for nausea or vomiting (nausea & vomiting).    . polyethylene glycol (MIRALAX / GLYCOLAX) packet Take 17 g by mouth daily as needed for mild constipation.    . prochlorperazine (COMPAZINE) 5 MG tablet Take 1 tablet (5 mg total) by mouth every 6 (six) hours as needed for nausea or vomiting. 120 tablet 2  . venlafaxine XR (EFFEXOR-XR) 75 MG 24 hr capsule TAKE 2 CAPSULES (150MG)    DAILY WITH BREAKFAST 180 capsule 0   No current facility-administered medications on file prior to visit.    OBJECTIVE: Middle-aged white woman examined in a wheelchair  Filed Vitals:   02/08/14 1126  BP: 156/90  Pulse: 93  Temp: 99.1 F (37.3 C)  Resp: 18  Body mass index is 32.22 kg/(m^2).  ECOG 2  Skin: warm, dry  HEENT: sclerae anicteric, conjunctivae pink, oropharynx clear. No thrush or mucositis.  Lymph Nodes: No cervical or supraclavicular lymphadenopathy  Lungs: clear to auscultation bilaterally, no rales, wheezes, or rhonci  Heart: regular rate and rhythm  Abdomen: round, soft, non tender, positive bowel sounds  Musculoskeletal: No focal spinal tenderness, no peripheral edema  Neuro: non focal, well oriented, positive affect  Breasts: deferred  LAB RESULTS:  Lab Results  Component Value Date   WBC 3.6* 02/08/2014   NEUTROABS 2.5 02/08/2014   HGB  11.6 02/08/2014   HCT 35.4 02/08/2014   MCV 97.3 02/08/2014   PLT 184 02/08/2014      Chemistry      Component Value Date/Time   NA 139 02/08/2014 1050   NA 141 03/29/2013 0350   NA 142 06/26/2011 0728  K 4.0 02/08/2014 1050   K 3.7 03/29/2013 0350   K 4.6 06/26/2011 0728   CL 111 03/29/2013 0350   CL 105 01/07/2012 1347   CL 102 06/26/2011 0728   CO2 23 02/08/2014 1050   CO2 18* 03/29/2013 0350   CO2 30 06/26/2011 0728   BUN 9.8 02/08/2014 1050   BUN 10 03/29/2013 0350   BUN 12 06/26/2011 0728   CREATININE 0.9 02/08/2014 1050   CREATININE 0.72 03/29/2013 0350   CREATININE 1.1 06/26/2011 0728      Component Value Date/Time   CALCIUM 8.7 02/08/2014 1050   CALCIUM 7.8* 03/29/2013 0350   CALCIUM 9.1 06/26/2011 0728   ALKPHOS 127 02/08/2014 1050   ALKPHOS 91 04/30/2013 1301   AST 24 02/08/2014 1050   AST 67* 04/30/2013 1301   ALT 15 02/08/2014 1050   ALT 93* 04/30/2013 1301   BILITOT 0.36 02/08/2014 1050   BILITOT 0.4 04/30/2013 1301     Results for SADIYAH, KANGAS (MRN [id number]) as of 01/25/2014 12:27  Ref. Range 10/12/2013 13:29 10/30/2013 14:31 11/30/2013 11:02 12/28/2013 11:05 01/11/2014 12:40  CA 27.29 Latest Range: 0-39 U/mL 342 (H) 337 (H) 383 (H) 328 (H) 306 (H)  CEA Latest Range: 0.0-5.0 ng/mL  85.5 (H) 93.6 (H) 79.0 (H) 70.4 (H)  Results for ALAINE, LOUGHNEY (MRN [id number]) as of 01/25/2014 12:27  Ref. Range 11/30/2013 11:02 12/11/2013 13:21 12/28/2013 11:05 01/11/2014 12:40 01/25/2014 11:26  Alkaline Phosphatase Latest Range: 39-117 U/L 192 (H) 286 (H) 238 (H) 200 (H) 158 (H)    STUDIES: No results found.  ASSESSMENT: 64 y.o. [place] woman   (1)  status post Left breast and Left axillary lymph node biopsy Dec 2010 for a cT3 pN1 (stage IIIA) invasive ductal carcinoma, grade 2, estrogen and progesterone receptor positive, HER-2 negative, with an MIB-1 of 44%;   (2)  treated neoadjuvantly with dose dense cyclophosphamide and doxorubicin x4 followed by  weekly paclitaxel x7  (3) s/p bilateral mastectomies May 2011. She had a residual 1.5 cm tumor, and one of 8 sampled lymph nodes was positive [ypT1c ypN1]. Margins were negative.   (4)  She completed post-mastectomy radiation in August of 2011   (5) on letrozole between August 2011 and October 2014  (6) PET scan 11/05/2012 obtained to evaluate right chest wall pain documents bony metastatic disease to the right seventh rib, L1, and the right acetabulum with an associated pathologic fracture through the anterior cortex of the acetabulum measuring 3.2 cm. There was no evidence of extra osseous metastatic disease.  (7) starting 11/05/2012 a received fulvestrant, 500 mg monthly, in addition to monthly infusions of zoledronic acid. The fulvestrant was discontinued May 2015 with evidence of progression  (8) Right rib, left hip and right hip were all treated to 37.5 Gy in 15 fractions at 2.5 Gy per fraction completed 12/17/2012  (9) 02/19/2013 bone marrow biopsy confirms metastatic disease to bone. HER-2 was not done because of decaalcification of specimen. Estrogen and progesterone receptors were read as negative. This i was felt to be possibly discordant.  (10) patient's tumor carries a PIK3 mutation as documented at [place]  (11) L ribcage pain: s/p palliative XRT completed 06/16/2013  (12) declined participation in a capecitabine plus PIK 3 inhibitor at [facility]; started capecitabine alone 06/27/2013, discontinued after one week because of side effects;  (a) resumed 08/17/2013 at 500 mg twice a day one-week on one-week off  (b) dose being increased gradually, received 1000 mg BID with  cycle starting 09/28/2013  (c) full dose of 1512m BID started 10/12/13  (13) radiation to Right sacrum/ lower lumbar spine 06/10 to 06/23   (14) non-displaced fracture proximal right humerus secondary to a fall November 2015   PLAN:  PFraser Dincontinues to do well with treatment. The labs  were reviewed in detail and were entirely stable. She will continue with the full dose of capecitabine. The 9th cycle will begin this upcoming Sunday.   She will continue her current pain management, unless instructed otherwise by the pain clinic this week.  PFraser Dinwill return in 2 weeks for labs and a follow up visit. She will be due for denosumab during that visit as well. She will have a repeat PET scan and discuss these result in March with Dr. MJana Hakim She understands and agrees with this plan. She knows the goal of treatment in her case is control. She has been encouraged to call with any issues that might arise before her next visit here.  FMarcelino Duster NP  02/08/2014

## 2014-02-08 NOTE — Telephone Encounter (Signed)
s.w. pt husband and advised on Jan and Feb appt....[name] Kitchenok and aware he will ck my chart

## 2014-02-09 ENCOUNTER — Telehealth: Payer: Self-pay | Admitting: Oncology

## 2014-02-09 LAB — CEA: CEA: 72.2 ng/mL — AB (ref 0.0–5.0)

## 2014-02-09 LAB — CANCER ANTIGEN 27.29: CA 27.29: 301 U/mL — ABNORMAL HIGH (ref 0–39)

## 2014-02-09 NOTE — Telephone Encounter (Signed)
, °

## 2014-02-18 ENCOUNTER — Ambulatory Visit: Payer: Medicare Other | Admitting: Oncology

## 2014-02-18 ENCOUNTER — Other Ambulatory Visit: Payer: Medicare Other

## 2014-02-19 ENCOUNTER — Other Ambulatory Visit: Payer: Self-pay | Admitting: *Deleted

## 2014-02-19 ENCOUNTER — Ambulatory Visit: Payer: Medicare Other | Admitting: Nurse Practitioner

## 2014-02-19 ENCOUNTER — Other Ambulatory Visit: Payer: Medicare Other

## 2014-02-19 DIAGNOSIS — C50512 Malignant neoplasm of lower-outer quadrant of left female breast: Secondary | ICD-10-CM

## 2014-02-22 ENCOUNTER — Ambulatory Visit (HOSPITAL_BASED_OUTPATIENT_CLINIC_OR_DEPARTMENT_OTHER): Payer: Medicare Other | Admitting: Nurse Practitioner

## 2014-02-22 ENCOUNTER — Ambulatory Visit (HOSPITAL_BASED_OUTPATIENT_CLINIC_OR_DEPARTMENT_OTHER): Payer: Medicare Other

## 2014-02-22 ENCOUNTER — Telehealth: Payer: Self-pay | Admitting: Nurse Practitioner

## 2014-02-22 ENCOUNTER — Encounter: Payer: Self-pay | Admitting: Nurse Practitioner

## 2014-02-22 ENCOUNTER — Other Ambulatory Visit (HOSPITAL_BASED_OUTPATIENT_CLINIC_OR_DEPARTMENT_OTHER): Payer: Medicare Other

## 2014-02-22 VITALS — BP 129/60 | HR 79 | Temp 98.1°F | Resp 18 | Ht 64.0 in | Wt 185.1 lb

## 2014-02-22 DIAGNOSIS — C50912 Malignant neoplasm of unspecified site of left female breast: Secondary | ICD-10-CM

## 2014-02-22 DIAGNOSIS — C773 Secondary and unspecified malignant neoplasm of axilla and upper limb lymph nodes: Secondary | ICD-10-CM

## 2014-02-22 DIAGNOSIS — C50512 Malignant neoplasm of lower-outer quadrant of left female breast: Secondary | ICD-10-CM

## 2014-02-22 DIAGNOSIS — C7951 Secondary malignant neoplasm of bone: Secondary | ICD-10-CM

## 2014-02-22 LAB — CBC WITH DIFFERENTIAL/PLATELET
BASO%: 0.3 % (ref 0.0–2.0)
Basophils Absolute: 0 10*3/uL (ref 0.0–0.1)
EOS ABS: 0.1 10*3/uL (ref 0.0–0.5)
EOS%: 2.3 % (ref 0.0–7.0)
HEMATOCRIT: 35.1 % (ref 34.8–46.6)
HEMOGLOBIN: 11.9 g/dL (ref 11.6–15.9)
LYMPH#: 0.8 10*3/uL — AB (ref 0.9–3.3)
LYMPH%: 19.5 % (ref 14.0–49.7)
MCH: 32.4 pg (ref 25.1–34.0)
MCHC: 33.9 g/dL (ref 31.5–36.0)
MCV: 95.6 fL (ref 79.5–101.0)
MONO#: 0.4 10*3/uL (ref 0.1–0.9)
MONO%: 9.3 % (ref 0.0–14.0)
NEUT%: 68.6 % (ref 38.4–76.8)
NEUTROS ABS: 2.7 10*3/uL (ref 1.5–6.5)
PLATELETS: 167 10*3/uL (ref 145–400)
RBC: 3.67 10*6/uL — ABNORMAL LOW (ref 3.70–5.45)
RDW: 14.7 % — AB (ref 11.2–14.5)
WBC: 3.9 10*3/uL (ref 3.9–10.3)

## 2014-02-22 LAB — COMPREHENSIVE METABOLIC PANEL (CC13)
ALBUMIN: 3.4 g/dL — AB (ref 3.5–5.0)
ALT: 17 U/L (ref 0–55)
AST: 24 U/L (ref 5–34)
Alkaline Phosphatase: 121 U/L (ref 40–150)
Anion Gap: 10 mEq/L (ref 3–11)
BILIRUBIN TOTAL: 0.34 mg/dL (ref 0.20–1.20)
BUN: 9.7 mg/dL (ref 7.0–26.0)
CO2: 22 mEq/L (ref 22–29)
Calcium: 9.1 mg/dL (ref 8.4–10.4)
Chloride: 106 mEq/L (ref 98–109)
Creatinine: 0.9 mg/dL (ref 0.6–1.1)
EGFR: 73 mL/min/{1.73_m2} — ABNORMAL LOW (ref >90)
Glucose: 127 mg/dl (ref 70–140)
POTASSIUM: 4 meq/L (ref 3.5–5.1)
Sodium: 139 mEq/L (ref 136–145)
TOTAL PROTEIN: 6.2 g/dL — AB (ref 6.4–8.3)

## 2014-02-22 MED ORDER — DENOSUMAB 120 MG/1.7ML ~~LOC~~ SOLN
120.0000 mg | Freq: Once | SUBCUTANEOUS | Status: AC
  Administered 2014-02-22: 120 mg via SUBCUTANEOUS
  Filled 2014-02-22: qty 1.7

## 2014-02-22 NOTE — Telephone Encounter (Signed)
per pof to sch pt appt-pt aware of appt

## 2014-02-22 NOTE — Progress Notes (Signed)
ID: Brooke [name]   DOB: 1950/11/14  MR#: [id number]  FYB#:[id number]  PCP: Irven Shelling, MD GYN: SU:  OTHER MD: Thea Silversmith, Gaynelle Arabian, [name] [phone]), Willa Rough DDS  CHIEF COMPLAINT: stage IV breast cancer CURRENT THERAPY: zolendronate, capecitabine  BREAST CANCER HISTORY: From the January 2011 summary:  Brooke [name] had screening mammography in August 04, 2007 which showed no specific mammographic evidence of malignancy, but she reported a left breast lump.  Accordingly, she was brought back on August 11, 2007 for mammography and ultrasonography.  There was indeed an obscured mass in the upper left breast which by ultrasound appeared to be a simple cyst.    Screening mammogram on August 04, 2008 showed in addition to dense breasts, again a possible mass in the left breast.  She had diagnostic mammography August 06, 2008 and additional views in addition to very dense breasts did not show any persistent mass or distortion.  Furthermore, Dr. Owens Shark was not able to palpate any abnormality in the lateral portion of the left breast.  Ultrasound showed normal appearing fibroglandular tissue in the area in question.    More recently, about September, the patient felt again something like a lump in the left breast and she had some pain associated with this.  This was initially felt simply to be mastalgia and was treated as such, but as it persisted, on December 23rd, the patient had left diagnostic mammography and ultrasonography.  There was a focally dense area in the left breast with very minimal distortion corresponding to the area of palpable abnormality. The ultrasound showed an irregular ill-defined, hypoechoic area with distal shadowing, measuring approximately 1 cm corresponding to the patient's palpable abnormality.    With this information, the patient was brought back for ultrasound-guided core biopsy and this was performed December 28th. 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  There was no evidence of angiolymphatic invasion in the material submitted which measured 1.5 cm maximally.  In addition to the mass in the breast, a lymph node in the left axilla was biopsied and was likewise positive.  The tumor was ER positive at 89%, PR positive at 34% with an elevated MIB-1 at 44% and Her-2 negative with a ratio of 1.13.    Her subsequent history is as detailed below.  INTERVAL HISTORY: Brooke [name] returns for follow up of her metastatic breast cancer, accompanied by her husband, [name]. She completed her 10th cycle of full dose capecitabine on 02/07/2013. She is tearful during our visit as she was unable to finish the last 3 days worth of doses, when generally she only has to skip 1 dose. She was excessively nauseous, dizzy, and disoriented. The nausea persisted through both compazine and zofran. She denies palmar/planta erythrodesthesia, diarrhea, or mouth sores.   REVIEW OF SYSTEMS: Brooke [name] denies fevers or chills. She generally takes 16-59m dilaudid daily for left leg, back, and right arm pain, but one day this past week she needed as much as 2104m She also wears 2538mfentanyl patches. She is not constipated from the narcotic use. She visited the pain clinic referred to her previously, and they offered epidural injections, but was unimpressed with their care during the visit. She is weak and needs assistance ambulating. She is also fairly fatigued and winded after taking care of basic hygiene tasks in the morning. She denies chest, pain, cough, or palpitations. She has no unusual headaches, dizziness, or visual changes. A detailed review of  systems is otherwise stable.   PAST MEDICAL HISTORY: Past Medical History  Diagnosis Date  . Hx Breast Cancer, IDC, Stahe III, receptor + 10/20/2010    left breast  . Breast cancer December 2010    invasive ductal and invasive lobular; bilateral mastectomy and  radiation  . Mitral valve prolapse   . History of recurrent UTIs   . Radiation 07/26/09-09/08/09    left breast  . Fracture, pelvis closed 11/2012    both hipsand number 7 rib, 15 rounds radiation  . Metastasis to bone   1. Remote migraines, currently inactive.   2. History of prior diagnosis of fibromyalgia made at [facility] and currently not active (she was treated with trimethoprim and nortriptyline for one year for this).    3. History of recurrent UTIs.   4. History of benign left breast cyst aspirated in 2004 at [facility] under Dr. Melene Plan.   5. History of tonsillectomy and adenoidectomy.   6. History of right LASEK surgery. 7. History of bilateral reduction mammoplasties in 1994.   8. History of mitral valve prolapse diagnosed in 1984.  PAST SURGICAL HISTORY: Past Surgical History  Procedure Laterality Date  . Mastectomy Bilateral 2011    left breast cancer  . Lasik    . Reduction mammaplasty Bilateral 1994  . Tonsillectomy and adenoidectomy    . Aspiration of left breast Left 2004    [facility]/Dr.Teo  . Bone biopsy  02/19/13    mets from breast cancer-receptors results not back    FAMILY HISTORY Family History  Problem Relation Age of Onset  . Lung cancer Father 27    smoker for 25 years  . Heart disease Maternal Grandmother   . Heart disease Maternal Grandfather   . Diabetes type I Mother   . Diabetes type I Sister   . Diabetes type I Sister   . Heart disease Maternal Uncle   . Uterine cancer Paternal Grandmother 78  . Breast cancer Other 9    paternal great grandmother  [name] died at the age of 53 from lung cancer.  He had been a remote smoker and had been in [employer] with a question of possible asbestos exposure.  [name] died at the age of 93 in the setting of Alzheimer's, coronary artery disease, diabetes and hypertension.  The patient had two sisters.  One died from complications of  diabetes.  The other one is alive with diabetes.    GYNECOLOGIC HISTORY: She is GX P1.  First pregnancy to term at age 23.  The patient's last period was in 2008.  She took hormone replacement for about 18 months until mid-2008.  She had no complications from that treatment.    SOCIAL HISTORY: (Updated October 2014) She used to be a Arts development officer for [employer]  They used to live in the [place] area. 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  Her husband, [name], retired October 2012.  He was a Arts development officer for 28 years.  Their son, [name], is currently with the [employer] at the [employer] in [place]. [name].  She attends [employer].   ADVANCED DIRECTIVES: in place  HEALTH MAINTENANCE: (Updated 11/19/2012) History  Substance Use Topics  . Smoking status: Former Smoker -- 1.00 packs/day for 11 years    Quit date: 01/23/1984  . Smokeless tobacco: Never Used  . Alcohol  Use: No     Colonoscopy: 2010  PAP: Jan 2014  Bone density: July 2014, osteopenia  Lipid panel: per Dr Valentina Lucks  Allergies  Allergen Reactions  . Sulfa Antibiotics Shortness Of Breath  . Codeine Nausea And Vomiting    Tolerates oxycodone if given with prochlorperazine for nausea  . Morphine And Related Nausea And Vomiting    Per patient possibility that the IR morphine was causing the vomiting   Current Outpatient Prescriptions on File Prior to Visit  Medication Sig Dispense Refill  . ALPRAZolam (XANAX XR) 1 MG 24 hr tablet Take 1 tablet (1 mg total) by mouth daily. 90 tablet 0  . Calcium Citrate (CITRACAL PO) Take 3 tablets by mouth daily.    . capecitabine (XELODA) 500 MG tablet Take 1,500 mg by mouth See admin instructions. Take twice daily after meals for 7 days then stop for 7 days    . cholecalciferol (VITAMIN D) 400 UNITS TABS tablet Take 400 Units by mouth daily. Patient reportd the Vitamin E should be vitamin D  with same dose    . diphenhydrAMINE (BENADRYL) 25 mg capsule Take 25 mg by mouth at bedtime as needed for sleep (sleep).     . fentaNYL (DURAGESIC - DOSED MCG/HR) 25 MCG/HR patch Place 1 patch (25 mcg total) onto the skin every 3 (three) days. 10 patch 0  . gabapentin (NEURONTIN) 300 MG capsule TAKE 1 CAPSULE (300 MG TOTAL) BY MOUTH AT BEDTIME. 90 capsule 0  . HYDROmorphone (DILAUDID) 4 MG tablet Take 1-2 tablets (4-8 mg total) by mouth every 4 (four) hours as needed for severe pain (pain). 120 tablet 0  . ondansetron (ZOFRAN) 8 MG tablet Take 8 mg by mouth 2 (two) times daily as needed for nausea or vomiting (nausea & vomiting).    . polyethylene glycol (MIRALAX / GLYCOLAX) packet Take 17 g by mouth daily as needed for mild constipation.    . prochlorperazine (COMPAZINE) 5 MG tablet Take 1 tablet (5 mg total) by mouth every 6 (six) hours as needed for nausea or vomiting. 120 tablet 2  . venlafaxine XR (EFFEXOR-XR) 75 MG 24 hr capsule TAKE 2 CAPSULES (150MG)    DAILY WITH BREAKFAST 180 capsule 0   No current facility-administered medications on file prior to visit.    OBJECTIVE: Middle-aged white woman examined in a wheelchair  Filed Vitals:   02/22/14 1306  BP: 129/60  Pulse: 79  Temp: 98.1 F (36.7 C)  Resp: 18  Body mass index is 31.76 kg/(m^2).  ECOG 2  Skin: warm, dry  HEENT: sclerae anicteric, conjunctivae pink, oropharynx clear. No thrush or mucositis.  Lymph Nodes: No cervical or supraclavicular lymphadenopathy  Lungs: clear to auscultation bilaterally, no rales, wheezes, or rhonci  Heart: regular rate and rhythm  Abdomen: round, soft, non tender, positive bowel sounds  Musculoskeletal: No focal spinal tenderness, no peripheral edema  Neuro: non focal, well oriented, positive affect  Breasts: deferred  LAB RESULTS:  Lab Results  Component Value Date   WBC 3.9 02/22/2014   NEUTROABS 2.7 02/22/2014   HGB 11.9 02/22/2014   HCT 35.1 02/22/2014   MCV 95.6 02/22/2014    PLT 167 02/22/2014      Chemistry      Component Value Date/Time   NA 139 02/22/2014 1250   NA 141 03/29/2013 0350   NA 142 06/26/2011 0728   K 4.0 02/22/2014 1250   K 3.7 03/29/2013 0350   K 4.6 06/26/2011 0728   CL 111  03/29/2013 0350   CL 105 01/07/2012 1347   CL 102 06/26/2011 0728   CO2 22 02/22/2014 1250   CO2 18* 03/29/2013 0350   CO2 30 06/26/2011 0728   BUN 9.7 02/22/2014 1250   BUN 10 03/29/2013 0350   BUN 12 06/26/2011 0728   CREATININE 0.9 02/22/2014 1250   CREATININE 0.72 03/29/2013 0350   CREATININE 1.1 06/26/2011 0728      Component Value Date/Time   CALCIUM 9.1 02/22/2014 1250   CALCIUM 7.8* 03/29/2013 0350   CALCIUM 9.1 06/26/2011 0728   ALKPHOS 121 02/22/2014 1250   ALKPHOS 91 04/30/2013 1301   AST 24 02/22/2014 1250   AST 67* 04/30/2013 1301   ALT 17 02/22/2014 1250   ALT 93* 04/30/2013 1301   BILITOT 0.34 02/22/2014 1250   BILITOT 0.4 04/30/2013 1301     Results for FELIPA, LAROCHE (MRN [id number]) as of 01/25/2014 12:27  Ref. Range 10/12/2013 13:29 10/30/2013 14:31 11/30/2013 11:02 12/28/2013 11:05 01/11/2014 12:40  CA 27.29 Latest Range: 0-39 U/mL 342 (H) 337 (H) 383 (H) 328 (H) 306 (H)  CEA Latest Range: 0.0-5.0 ng/mL  85.5 (H) 93.6 (H) 79.0 (H) 70.4 (H)  Results for JAQUANDA, WICKERSHAM (MRN [id number]) as of 01/25/2014 12:27  Ref. Range 11/30/2013 11:02 12/11/2013 13:21 12/28/2013 11:05 01/11/2014 12:40 01/25/2014 11:26  Alkaline Phosphatase Latest Range: 39-117 U/L 192 (H) 286 (H) 238 (H) 200 (H) 158 (H)    STUDIES: No results found.  ASSESSMENT: 64 y.o. [place] woman   (1)  status post Left breast and Left axillary lymph node biopsy Dec 2010 for a cT3 pN1 (stage IIIA) invasive ductal carcinoma, grade 2, estrogen and progesterone receptor positive, HER-2 negative, with an MIB-1 of 44%;   (2)  treated neoadjuvantly with dose dense cyclophosphamide and doxorubicin x4 followed by weekly paclitaxel x7  (3) s/p bilateral mastectomies May 2011. She  had a residual 1.5 cm tumor, and one of 8 sampled lymph nodes was positive [ypT1c ypN1]. Margins were negative.   (4)  She completed post-mastectomy radiation in August of 2011   (5) on letrozole between August 2011 and October 2014  (6) PET scan 11/05/2012 obtained to evaluate right chest wall pain documents bony metastatic disease to the right seventh rib, L1, and the right acetabulum with an associated pathologic fracture through the anterior cortex of the acetabulum measuring 3.2 cm. There was no evidence of extra osseous metastatic disease.  (7) starting 11/05/2012 a received fulvestrant, 500 mg monthly, in addition to monthly infusions of zoledronic acid. The fulvestrant was discontinued May 2015 with evidence of progression  (8) Right rib, left hip and right hip were all treated to 37.5 Gy in 15 fractions at 2.5 Gy per fraction completed 12/17/2012  (9) 02/19/2013 bone marrow biopsy confirms metastatic disease to bone. HER-2 was not done because of decaalcification of specimen. Estrogen and progesterone receptors were read as negative. This i was felt to be possibly discordant.  (10) patient's tumor carries a PIK3 mutation as documented at [place]  (11) L ribcage pain: s/p palliative XRT completed 06/16/2013  (12) declined participation in a capecitabine plus PIK 3 inhibitor at [facility]; started capecitabine alone 06/27/2013, discontinued after one week because of side effects;  (a) resumed 08/17/2013 at 500 mg twice a day one-week on one-week off  (b) dose being increased gradually, received 1000 mg BID with cycle starting 09/28/2013  (c) full dose of 1553m BID started 10/12/13  (13) radiation to Right sacrum/ lower lumbar  spine 06/10 to 06/23   (14) non-displaced fracture proximal right humerus secondary to a fall November 2015   PLAN:  Brooke [name] was visually emotional during our office visit. She is disappointed in herself, and feels like she is not doing  every thing she could have to control her cancer. I reassured her that she can only do what her body allows her to do. I offered to send a prescription for phenergan suppositories, should she get to a point where she is having trouble keeping pills down, but she declined.   Pat plans to be evaluated by Dr. Park Breed, who is a neuro pain physician with [facility], and a family friend of theirs. Am having their last 4 office visits sent to his office for reviewing.   Brooke [name] is scheduled for a repeat PET on 2/12. In 2 weeks she will have labs and a follow up visit with Dr. Jana Hakim to discuss these result. She understands and agrees with this plan. She knows the goal of treatment in her case is control. She has been encouraged to call with any issues that might arise before her next visit here.  Marcelino Duster, NP  02/22/2014

## 2014-03-02 ENCOUNTER — Other Ambulatory Visit: Payer: Self-pay | Admitting: Oncology

## 2014-03-04 ENCOUNTER — Other Ambulatory Visit: Payer: Self-pay | Admitting: Oncology

## 2014-03-04 ENCOUNTER — Telehealth: Payer: Self-pay | Admitting: Oncology

## 2014-03-04 DIAGNOSIS — C50919 Malignant neoplasm of unspecified site of unspecified female breast: Secondary | ICD-10-CM

## 2014-03-04 MED ORDER — DEXAMETHASONE 4 MG PO TABS: 8.0000 mg | ORAL_TABLET | Freq: Two times a day (BID) | ORAL | Status: DC

## 2014-03-04 NOTE — Telephone Encounter (Signed)
per pof ot have scan within 24hr-scan already changed to 2/12

## 2014-03-05 ENCOUNTER — Other Ambulatory Visit: Payer: Self-pay | Admitting: *Deleted

## 2014-03-05 ENCOUNTER — Other Ambulatory Visit: Payer: Self-pay | Admitting: Oncology

## 2014-03-05 ENCOUNTER — Encounter (HOSPITAL_COMMUNITY): Payer: Self-pay

## 2014-03-05 ENCOUNTER — Ambulatory Visit (HOSPITAL_COMMUNITY)
Admission: RE | Admit: 2014-03-05 | Discharge: 2014-03-05 | Disposition: A | Discharge status: Home / Self Care | Payer: Medicare Other | Source: Ambulatory Visit | Attending: Oncology | Admitting: Oncology

## 2014-03-05 ENCOUNTER — Ambulatory Visit (HOSPITAL_COMMUNITY)
Admission: RE | Admit: 2014-03-05 | Discharge: 2014-03-05 | Disposition: A | Discharge status: Home / Self Care | Payer: Medicare Other | Source: Ambulatory Visit | Attending: Nurse Practitioner | Admitting: Nurse Practitioner

## 2014-03-05 DIAGNOSIS — C50919 Malignant neoplasm of unspecified site of unspecified female breast: Secondary | ICD-10-CM

## 2014-03-05 DIAGNOSIS — R200 Anesthesia of skin: Secondary | ICD-10-CM

## 2014-03-05 DIAGNOSIS — C50911 Malignant neoplasm of unspecified site of right female breast: Secondary | ICD-10-CM | POA: Diagnosis present

## 2014-03-05 DIAGNOSIS — Z79899 Other long term (current) drug therapy: Secondary | ICD-10-CM | POA: Insufficient documentation

## 2014-03-05 DIAGNOSIS — R202 Paresthesia of skin: Secondary | ICD-10-CM

## 2014-03-05 DIAGNOSIS — C7951 Secondary malignant neoplasm of bone: Secondary | ICD-10-CM

## 2014-03-05 DIAGNOSIS — F40240 Claustrophobia: Secondary | ICD-10-CM

## 2014-03-05 DIAGNOSIS — C50512 Malignant neoplasm of lower-outer quadrant of left female breast: Secondary | ICD-10-CM

## 2014-03-05 LAB — GLUCOSE, CAPILLARY: GLUCOSE-CAPILLARY: 104 mg/dL — AB (ref 70–99)

## 2014-03-05 MED ORDER — GADOBENATE DIMEGLUMINE 529 MG/ML IV SOLN
15.0000 mL | Freq: Once | INTRAVENOUS | Status: AC
  Administered 2014-03-05: 15 mL via INTRAVENOUS

## 2014-03-05 MED ORDER — LORAZEPAM 1 MG PO TABS: 1.0000 mg | ORAL_TABLET | Freq: Three times a day (TID) | ORAL | Status: DC

## 2014-03-05 MED ORDER — LORAZEPAM 1 MG PO TABS
ORAL_TABLET | ORAL | Status: AC
  Filled 2014-03-05: qty 2

## 2014-03-05 MED ORDER — LORAZEPAM 1 MG PO TABS: 2.0000 mg | ORAL_TABLET | Freq: Once | ORAL | Status: AC

## 2014-03-05 MED ORDER — FLUDEOXYGLUCOSE F - 18 (FDG) INJECTION
9.2000 | Freq: Once | INTRAVENOUS | Status: AC | PRN
  Administered 2014-03-05: 9.2 via INTRAVENOUS

## 2014-03-07 ENCOUNTER — Other Ambulatory Visit: Payer: Self-pay | Admitting: Oncology

## 2014-03-07 NOTE — Progress Notes (Unsigned)
Brooke [name] was evaluated by Dr. Wilford Grist at the pain clinic. He was concerned that she might have a sensory level around T1 and suggested in addition to a PET scan we obtained MRIs of the relevant areas. Fortunately they do not show evidence of cord compression and overall do show stable disease. He suggested we consider a referral to Dr. Debarah Crape at [facility] in [place] for consideration of an epidural narcotic pump, which might minimize side effects and provide optimal control. We will discuss this when pet returns to see me next week.  Mr Cervical Spine W Wo Contrast  03/05/2014   CLINICAL DATA:  Metastatic breast cancer. Numbness and tingling and pain. Rule out cord compression.  EXAM: MRI CERVICAL AND THORACIC SPINE WITHOUT AND WITH CONTRAST  TECHNIQUE: Multiplanar and multiecho pulse sequences of the cervical spine, to include the craniocervical junction and cervicothoracic junction, and thoracic spine, were obtained without and with intravenous contrast.  CONTRAST:  77mL MULTIHANCE GADOBENATE DIMEGLUMINE 529 MG/ML IV SOLN  COMPARISON:  Thoracic MRI 10/09/2013  FINDINGS: MRI CERVICAL SPINE FINDINGS  Bone marrow signal is diffusely abnormal throughout the entire cervical spine compatible with widespread bony metastatic disease. This involves all vertebral bodies and posterior elements. In addition, there is metastatic disease in the sternum and right clavicle. Right clavicular head is enlarged due to tumor.  Negative for pathologic fracture in the cervical spine. No epidural tumor or cord compression. Spinal cord signal is normal without evidence of edema or abnormal enhancement.  Disc degeneration and spondylosis is present at C3-4, C4-5, C5-6, and C6-7. This is causing mild spinal stenosis and foraminal encroachment due to diffuse uncinate spurring.  MRI THORACIC SPINE FINDINGS  Metastatic disease present throughout the thoracic spine involving all vertebral bodies and posterior  elements. Bone marrow is diffusely heterogeneous. Bone marrow shows progression of heterogeneity and increased enhancement diffusely. This may be due to treatment effect or progression of tumor.  Mild epidural tumor on the right at T3 is unchanged. Small amount of epidural tumor on the right at T10 is unchanged. Mild pathologic fractures involving T10, T11, and T12 are unchanged. Mild retropulsion of bone at T12 is unchanged. No new fracture.  Negative for cord compression.  Spinal cord signal is normal.  Central disc protrusion at T4-5 unchanged.  Left-sided osteophyte T5-6 unchanged.  Small central disc protrusion T6-7 unchanged.  Disc degeneration and spurring T12-L1 unchanged.  IMPRESSION: Widespread bony metastatic disease throughout the cervical spine, including the sternum and right clavicle. Negative for pathologic fracture or cord compression. 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  Widespread metastatic disease throughout the thoracic spine. Bone marrow is more heterogeneous and shows increased enhancement which may be related to treatment or progression of tumor.  Mild epidural tumor on the right at T3 and T10 unchanged. No cord compression.  Pathologic fractures at T10, T11, and T12 are unchanged from the prior MRI.   Electronically Signed   By: Franchot Gallo M.D.   On: 03/05/2014 15:09   Mr Thoracic Spine W Wo Contrast  03/05/2014   CLINICAL DATA:  Metastatic breast cancer. Numbness and tingling and pain. Rule out cord compression.  EXAM: MRI CERVICAL AND THORACIC SPINE WITHOUT AND WITH CONTRAST  TECHNIQUE: Multiplanar and multiecho pulse sequences of the cervical spine, to include the craniocervical junction and cervicothoracic junction, and thoracic spine, were obtained without and with intravenous contrast.  CONTRAST:  52mL MULTIHANCE GADOBENATE DIMEGLUMINE 529 MG/ML IV SOLN  COMPARISON:  Thoracic MRI 10/09/2013  FINDINGS: MRI CERVICAL SPINE FINDINGS  Bone marrow signal is  diffusely abnormal throughout the entire cervical spine compatible with widespread bony metastatic disease. This involves all vertebral bodies and posterior elements. In addition, there is metastatic disease in the sternum and right clavicle. Right clavicular head is enlarged due to tumor.  Negative for pathologic fracture in the cervical spine. No epidural tumor or cord compression. Spinal cord signal is normal without evidence of edema or abnormal enhancement.  Disc degeneration and spondylosis is present at C3-4, C4-5, C5-6, and C6-7. This is causing mild spinal stenosis and foraminal encroachment due to diffuse uncinate spurring.  MRI THORACIC SPINE FINDINGS  Metastatic disease present throughout the thoracic spine involving all vertebral bodies and posterior elements. Bone marrow is diffusely heterogeneous. Bone marrow shows progression of heterogeneity and increased enhancement diffusely. This may be due to treatment effect or progression of tumor.  Mild epidural tumor on the right at T3 is unchanged. Small amount of epidural tumor on the right at T10 is unchanged. Mild pathologic fractures involving T10, T11, and T12 are unchanged. Mild retropulsion of bone at T12 is unchanged. No new fracture.  Negative for cord compression.  Spinal cord signal is normal.  Central disc protrusion at T4-5 unchanged.  Left-sided osteophyte T5-6 unchanged.  Small central disc protrusion T6-7 unchanged.  Disc degeneration and spurring T12-L1 unchanged.  IMPRESSION: Widespread bony metastatic disease throughout the cervical spine, including the sternum and right clavicle. Negative for pathologic fracture or cord compression. 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  Widespread metastatic disease throughout the thoracic spine. Bone marrow is more heterogeneous and shows increased enhancement which may be related to treatment or progression of tumor.  Mild epidural tumor on the right at T3 and T10 unchanged. No  cord compression.  Pathologic fractures at T10, T11, and T12 are unchanged from the prior MRI.   Electronically Signed   By: Franchot Gallo M.D.   On: 03/05/2014 15:09   Nm Pet Image Restag (ps) Skull Base To Thigh  03/05/2014   CLINICAL DATA:  Subsequent Treatment strategy for metastatic breast cancer.  EXAM: NUCLEAR MEDICINE PET SKULL BASE TO THIGH  TECHNIQUE: 9.2 mCi F-18 FDG was injected intravenously. Full-ring PET imaging was performed from the skull base to thigh after the radiotracer. CT data was obtained and used for attenuation correction and anatomic localization.  FASTING BLOOD GLUCOSE:  Value: 104 mg/dl  COMPARISON:  Multiple prior PET CTs.  The most recent is 11/27/2013  FINDINGS: NECK  No hypermetabolic lymph nodes in the neck.  CHEST  No hypermetabolic mediastinal or hilar nodes. No suspicious pulmonary nodules on the CT scan. No evidence of chest wall recurrence or axillary adenopathy. There are small bilateral pleural effusions and overlying atelectasis.  ABDOMEN/PELVIS  No abnormal hypermetabolic activity within the liver, pancreas, adrenal glands, or spleen. No hypermetabolic lymph nodes in the abdomen or pelvis.  SKELETON  Diffuse skeletal metastatic disease is again demonstrated. No significant change in the level of hypermetabolism in the vertebral bodies, ribs or extremity lesions. However, the thoracic spine is improved. Below T7 I do not see any persistent hypermetabolic changes and would suspect the patient had thoracic spine radiation. The lumbar and right sacrum areas appear stable. Only the L2 vertebral body demonstrates hypermetabolism. The hypermetabolic disease in the left pelvis appears relatively stable. The uptake in the left proximal femur has diminished. No new bone lesions.  IMPRESSION: Suspect radiation change in the thoracic spine area with resolution of hypermetabolic  spine lesions below T7. The left proximal femur lesion also appears improved. The remaining metastatic  disease appears relatively stable.  No findings for metastatic disease involving the lungs or liver.   Electronically Signed   By: Marijo Sanes M.D.   On: 03/05/2014 10:51

## 2014-03-10 ENCOUNTER — Other Ambulatory Visit: Payer: Medicare Other

## 2014-03-10 ENCOUNTER — Ambulatory Visit (HOSPITAL_BASED_OUTPATIENT_CLINIC_OR_DEPARTMENT_OTHER): Payer: Medicare Other | Admitting: Oncology

## 2014-03-10 ENCOUNTER — Other Ambulatory Visit (HOSPITAL_BASED_OUTPATIENT_CLINIC_OR_DEPARTMENT_OTHER): Payer: Medicare Other

## 2014-03-10 VITALS — BP 125/79 | HR 109 | Temp 98.4°F | Resp 18 | Ht 64.0 in | Wt 188.9 lb

## 2014-03-10 DIAGNOSIS — C50919 Malignant neoplasm of unspecified site of unspecified female breast: Secondary | ICD-10-CM

## 2014-03-10 DIAGNOSIS — C7951 Secondary malignant neoplasm of bone: Secondary | ICD-10-CM

## 2014-03-10 DIAGNOSIS — C50512 Malignant neoplasm of lower-outer quadrant of left female breast: Secondary | ICD-10-CM

## 2014-03-10 DIAGNOSIS — R0781 Pleurodynia: Secondary | ICD-10-CM

## 2014-03-10 DIAGNOSIS — M25551 Pain in right hip: Secondary | ICD-10-CM

## 2014-03-10 DIAGNOSIS — M79603 Pain in arm, unspecified: Secondary | ICD-10-CM

## 2014-03-10 DIAGNOSIS — M8580 Other specified disorders of bone density and structure, unspecified site: Secondary | ICD-10-CM

## 2014-03-10 DIAGNOSIS — C773 Secondary and unspecified malignant neoplasm of axilla and upper limb lymph nodes: Secondary | ICD-10-CM

## 2014-03-10 LAB — CBC WITH DIFFERENTIAL/PLATELET
BASO%: 0.4 % (ref 0.0–2.0)
Basophils Absolute: 0 10*3/uL (ref 0.0–0.1)
EOS ABS: 0.4 10*3/uL (ref 0.0–0.5)
EOS%: 6.9 % (ref 0.0–7.0)
HEMATOCRIT: 36.9 % (ref 34.8–46.6)
HGB: 12.5 g/dL (ref 11.6–15.9)
LYMPH%: 16.6 % (ref 14.0–49.7)
MCH: 32.4 pg (ref 25.1–34.0)
MCHC: 33.9 g/dL (ref 31.5–36.0)
MCV: 95.6 fL (ref 79.5–101.0)
MONO#: 0.7 10*3/uL (ref 0.1–0.9)
MONO%: 12.8 % (ref 0.0–14.0)
NEUT#: 3.2 10*3/uL (ref 1.5–6.5)
NEUT%: 63.3 % (ref 38.4–76.8)
Platelets: 216 10*3/uL (ref 145–400)
RBC: 3.86 10*6/uL (ref 3.70–5.45)
RDW: 14.4 % (ref 11.2–14.5)
WBC: 5.1 10*3/uL (ref 3.9–10.3)
lymph#: 0.8 10*3/uL — ABNORMAL LOW (ref 0.9–3.3)

## 2014-03-10 LAB — CANCER ANTIGEN 27.29: CA 27.29: 351 U/mL — ABNORMAL HIGH (ref 0–39)

## 2014-03-10 LAB — CEA: CEA: 93.1 ng/mL — ABNORMAL HIGH (ref 0.0–5.0)

## 2014-03-10 LAB — COMPREHENSIVE METABOLIC PANEL (CC13)
ALT: 12 U/L (ref 0–55)
ANION GAP: 10 meq/L (ref 3–11)
AST: 20 U/L (ref 5–34)
Albumin: 3.3 g/dL — ABNORMAL LOW (ref 3.5–5.0)
Alkaline Phosphatase: 128 U/L (ref 40–150)
BILIRUBIN TOTAL: 0.31 mg/dL (ref 0.20–1.20)
BUN: 11.8 mg/dL (ref 7.0–26.0)
CO2: 25 meq/L (ref 22–29)
CREATININE: 0.8 mg/dL (ref 0.6–1.1)
Calcium: 9.3 mg/dL (ref 8.4–10.4)
Chloride: 102 mEq/L (ref 98–109)
EGFR: 76 mL/min/{1.73_m2} — AB (ref >90)
GLUCOSE: 109 mg/dL (ref 70–140)
Potassium: 4.2 mEq/L (ref 3.5–5.1)
Sodium: 137 mEq/L (ref 136–145)
Total Protein: 6.3 g/dL — ABNORMAL LOW (ref 6.4–8.3)

## 2014-03-10 NOTE — Progress Notes (Signed)
ID: Brooke [name]   DOB: 12/12/1950  MR#: [id number]  DHR#:[id number]  PCP: Irven Shelling, MD GYN: SU:  OTHER MD: Thea Silversmith, Gaynelle Arabian, [name] [phone]), Willa Rough DDS  CHIEF COMPLAINT: stage IV breast cancer CURRENT THERAPY: zolendronate, capecitabine  BREAST CANCER HISTORY: From the January 2011 summary:  Brooke [name] had screening mammography in August 04, 2007 which showed no specific mammographic evidence of malignancy, but she reported a left breast lump.  Accordingly, she was brought back on August 11, 2007 for mammography and ultrasonography.  There was indeed an obscured mass in the upper left breast which by ultrasound appeared to be a simple cyst.    Screening mammogram on August 04, 2008 showed in addition to dense breasts, again a possible mass in the left breast.  She had diagnostic mammography August 06, 2008 and additional views in addition to very dense breasts did not show any persistent mass or distortion.  Furthermore, Dr. Owens Shark was not able to palpate any abnormality in the lateral portion of the left breast.  Ultrasound showed normal appearing fibroglandular tissue in the area in question.    More recently, about September, the patient felt again something like a lump in the left breast and she had some pain associated with this.  This was initially felt simply to be mastalgia and was treated as such, but as it persisted, on December 23rd, the patient had left diagnostic mammography and ultrasonography.  There was a focally dense area in the left breast with very minimal distortion corresponding to the area of palpable abnormality. The ultrasound showed an irregular ill-defined, hypoechoic area with distal shadowing, measuring approximately 1 cm corresponding to the patient's palpable abnormality.    With this information, the patient was brought back for ultrasound-guided core biopsy and this was performed December 28th. 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  There was no evidence of angiolymphatic invasion in the material submitted which measured 1.5 cm maximally.  In addition to the mass in the breast, a lymph node in the left axilla was biopsied and was likewise positive.  The tumor was ER positive at 89%, PR positive at 34% with an elevated MIB-1 at 44% and Her-2 negative with a ratio of 1.13.    Her subsequent history is as detailed below.  INTERVAL HISTORY: Brooke [name] returns for follow up of her metastatic breast cancer, accompanied by her husband, [name]. She completed her 12th cycle of full dose capecitabine on 03/07/2014.  In general she is tolerating the capecitabine better. Most of the time she misses one dose , usually the morning dose on day 4. Since her last visit here she has been evaluated at the pain clinic by  [name] , who contacted me concerned that she might have a sensory level. Accordingly we added MRIs of the cervical and thoracic spine to her restaging PET scan.  She was briefly placed on steroids pending those results. Thankfully there is no evidence of cord compression and the overall results of the staging studies show stability.  REVIEW OF SYSTEMS: Brooke [name]  gnerally takes between 16 and 18 mg of Dilantin daily. Rarely she takes a little more. She has  fixed the constipation problem with  miralax  And is very regular.  She is not using the wheelchair at home, but is also not walking very much. Mostly she does not hurt when she is just sitting down. She understands the purpose of the narcotics  is to help her be more active. She does complain of fatigue , shortness of breath particularly when going up steps (which is very hard for her ). She sleeps on 2 pillows. She has a mild dry cough. She feels anxious but not depressed. She gets " groggy or dopey" when she takes the capecitabine -- since she does not take antinausea medicine until they've 4 , this is not  due to Compazine.  She has not had any palmar or plantar erythrodysesthesia or mouth sores from the capecitabine. She is tolerating the denosumab with no side effects that she is aware of. A detailed review of systems today was otherwise stable.  PAST MEDICAL HISTORY: Past Medical History  Diagnosis Date  . Hx Breast Cancer, IDC, Stahe III, receptor + 10/20/2010    left breast  . Breast cancer December 2010    invasive ductal and invasive lobular; bilateral mastectomy and radiation  . Mitral valve prolapse   . History of recurrent UTIs   . Radiation 07/26/09-09/08/09    left breast  . Fracture, pelvis closed 11/2012    both hipsand number 7 rib, 15 rounds radiation  . Metastasis to bone   1. Remote migraines, currently inactive.   2. History of prior diagnosis of fibromyalgia made at [facility] and currently not active (she was treated with trimethoprim and nortriptyline for one year for this).    3. History of recurrent UTIs.   4. History of benign left breast cyst aspirated in 2004 at [facility] under Dr. Melene Plan.   5. History of tonsillectomy and adenoidectomy.   6. History of right LASEK surgery. 7. History of bilateral reduction mammoplasties in 1994.   8. History of mitral valve prolapse diagnosed in 1984.  PAST SURGICAL HISTORY: Past Surgical History  Procedure Laterality Date  . Mastectomy Bilateral 2011    left breast cancer  . Lasik    . Reduction mammaplasty Bilateral 1994  . Tonsillectomy and adenoidectomy    . Aspiration of left breast Left 2004    [facility]/Dr.Teo  . Bone biopsy  02/19/13    mets from breast cancer-receptors results not back    FAMILY HISTORY Family History  Problem Relation Age of Onset  . Lung cancer Father 77    smoker for 25 years  . Heart disease Maternal Grandmother   . Heart disease Maternal Grandfather   . Diabetes type I Mother   . Diabetes type I Sister   . Diabetes type I Sister   . Heart  disease Maternal Uncle   . Uterine cancer Paternal Grandmother 44  . Breast cancer Other 21    paternal great grandmother  [name] died at the age of 72 from lung cancer.  He had been a remote smoker and had been in [employer] with a question of possible asbestos exposure.  [name] died at the age of 65 in the setting of Alzheimer's, coronary artery disease, diabetes and hypertension.  The patient had two sisters.  One died from complications of diabetes.  The other one is alive with diabetes.    GYNECOLOGIC HISTORY: She is GX P1.  First pregnancy to term at age 68.  The patient's last period was in 2008.  She took hormone replacement for about 18 months until mid-2008.  She had no complications from that treatment.    SOCIAL HISTORY: (Updated October 2014) She used to be a Arts development officer for [employer]  They used to  live in the [place] area. 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  Her husband, [name], retired October 2012.  He was a Arts development officer for 28 years.  Their son, [name], is currently with the [employer] at the [employer] in [place]. [name].  She attends [employer].   ADVANCED DIRECTIVES: in place  HEALTH MAINTENANCE: (Updated 11/19/2012) History  Substance Use Topics  . Smoking status: Former Smoker -- 1.00 packs/day for 11 years    Quit date: 01/23/1984  . Smokeless tobacco: Never Used  . Alcohol Use: No     Colonoscopy: 2010  PAP: Jan 2014  Bone density: July 2014, osteopenia  Lipid panel: per Dr Valentina Lucks  Allergies  Allergen Reactions  . Sulfa Antibiotics Shortness Of Breath  . Codeine Nausea And Vomiting    Tolerates oxycodone if given with prochlorperazine for nausea  . Morphine And Related Nausea And Vomiting    Per patient possibility that the IR morphine was causing the vomiting   Current Outpatient Prescriptions on File Prior to Visit   Medication Sig Dispense Refill  . ALPRAZolam (XANAX XR) 1 MG 24 hr tablet Take 1 tablet (1 mg total) by mouth daily. 90 tablet 0  . Calcium Citrate (CITRACAL PO) Take 3 tablets by mouth daily.    . capecitabine (XELODA) 500 MG tablet Take 1,500 mg by mouth See admin instructions. Take twice daily after meals for 7 days then stop for 7 days    . cholecalciferol (VITAMIN D) 400 UNITS TABS tablet Take 400 Units by mouth daily. Patient reportd the Vitamin E should be vitamin D with same dose    . dexamethasone (DECADRON) 4 MG tablet Take 8 mg by mouth 2 (two) times daily.    [name] Kitchen dexamethasone (DECADRON) 4 MG tablet Take 2 tablets (8 mg total) by mouth 2 (two) times daily with a meal. 60 tablet 3  . diphenhydrAMINE (BENADRYL) 25 mg capsule Take 25 mg by mouth at bedtime as needed for sleep (sleep).     . fentaNYL (DURAGESIC - DOSED MCG/HR) 25 MCG/HR patch Place 1 patch (25 mcg total) onto the skin every 3 (three) days. 10 patch 0  . gabapentin (NEURONTIN) 300 MG capsule TAKE ONE CAPSULE BY MOUTH AT BEDTIME 90 capsule 0  . HYDROmorphone (DILAUDID) 4 MG tablet Take 1-2 tablets (4-8 mg total) by mouth every 4 (four) hours as needed for severe pain (pain). 120 tablet 0  . ondansetron (ZOFRAN) 8 MG tablet Take 8 mg by mouth 2 (two) times daily as needed for nausea or vomiting (nausea & vomiting).    . polyethylene glycol (MIRALAX / GLYCOLAX) packet Take 17 g by mouth daily as needed for mild constipation.    . prochlorperazine (COMPAZINE) 5 MG tablet Take 1 tablet (5 mg total) by mouth every 6 (six) hours as needed for nausea or vomiting. 120 tablet 2  . venlafaxine XR (EFFEXOR-XR) 75 MG 24 hr capsule TAKE 2 CAPSULES (150MG)    DAILY WITH BREAKFAST 180 capsule 0   Current Facility-Administered Medications on File Prior to Visit  Medication Dose Route Frequency Provider Last Rate Last Dose  . LORazepam (ATIVAN) tablet 2 mg  2 mg Oral Once Chauncey Cruel, MD        OBJECTIVE: Middle-aged white woman  who  is able to walk to and sit on the examination table  Filed Vitals:   03/10/14 1542  BP: 125/79  Pulse: 109  Temp: 98.4 F (36.9 C)  Resp: 18  Body mass index is 32.41 kg/(m^2).  ECOG 2  Sclerae unicteric, pupils equal and reactive Oropharynx clear and moist-- no thrush or other lesions No cervical or supraclavicular adenopathy Lungs no rales or rhonchi Heart regular rate and rhythm Abd soft, nontender, positive bowel sounds MSK  Diffuse but mild spinal tenderness, no upper extremity lymphedema Neuro: nonfocal, well oriented, appropriate affect Breasts:  deferred   LAB RESULTS:  Lab Results  Component Value Date   WBC 5.1 03/10/2014   NEUTROABS 3.2 03/10/2014   HGB 12.5 03/10/2014   HCT 36.9 03/10/2014   MCV 95.6 03/10/2014   PLT 216 03/10/2014      Chemistry      Component Value Date/Time   NA 137 03/10/2014 1512   NA 141 03/29/2013 0350   NA 142 06/26/2011 0728   K 4.2 03/10/2014 1512   K 3.7 03/29/2013 0350   K 4.6 06/26/2011 0728   CL 111 03/29/2013 0350   CL 105 01/07/2012 1347   CL 102 06/26/2011 0728   CO2 25 03/10/2014 1512   CO2 18* 03/29/2013 0350   CO2 30 06/26/2011 0728   BUN 11.8 03/10/2014 1512   BUN 10 03/29/2013 0350   BUN 12 06/26/2011 0728   CREATININE 0.8 03/10/2014 1512   CREATININE 0.72 03/29/2013 0350   CREATININE 1.1 06/26/2011 0728      Component Value Date/Time   CALCIUM 9.3 03/10/2014 1512   CALCIUM 7.8* 03/29/2013 0350   CALCIUM 9.1 06/26/2011 0728   ALKPHOS 128 03/10/2014 1512   ALKPHOS 91 04/30/2013 1301   AST 20 03/10/2014 1512   AST 67* 04/30/2013 1301   ALT 12 03/10/2014 1512   ALT 93* 04/30/2013 1301   BILITOT 0.31 03/10/2014 1512   BILITOT 0.4 04/30/2013 1301     Results for ICEIS, KNAB (MRN [id number]) as of 03/10/2014 17:32  Ref. Range 01/11/2014 12:40 01/25/2014 11:26 02/08/2014 10:50 02/22/2014 12:50 03/10/2014 15:12  Alkaline Phosphatase Latest Range: 39-117 U/L 200 (H) 158 (H) 127 121 128   Results for  LYNDY, RUSSMAN (MRN [id number]) as of 03/10/2014 17:32  Ref. Range 11/30/2013 11:02 12/28/2013 11:05 01/11/2014 12:40 01/25/2014 11:26 02/08/2014 10:50  CA 27.29 Latest Range: 0-39 U/mL 383 (H) 328 (H) 306 (H) 321 (H) 301 (H)  CEA Latest Range: 0.0-5.0 ng/mL 93.6 (H) 79.0 (H) 70.4 (H) 68.2 (H) 72.2 (H)   STUDIES: Mr Cervical Spine W Wo Contrast  03/05/2014   CLINICAL DATA:  Metastatic breast cancer. Numbness and tingling and pain. Rule out cord compression.  EXAM: MRI CERVICAL AND THORACIC SPINE WITHOUT AND WITH CONTRAST  TECHNIQUE: Multiplanar and multiecho pulse sequences of the cervical spine, to include the craniocervical junction and cervicothoracic junction, and thoracic spine, were obtained without and with intravenous contrast.  CONTRAST:  60m MULTIHANCE GADOBENATE DIMEGLUMINE 529 MG/ML IV SOLN  COMPARISON:  Thoracic MRI 10/09/2013  FINDINGS: MRI CERVICAL SPINE FINDINGS  Bone marrow signal is diffusely abnormal throughout the entire cervical spine compatible with widespread bony metastatic disease. This involves all vertebral bodies and posterior elements. In addition, there is metastatic disease in the sternum and right clavicle. Right clavicular head is enlarged due to tumor.  Negative for pathologic fracture in the cervical spine. No epidural tumor or cord compression. Spinal cord signal is normal without evidence of edema or abnormal enhancement.  Disc degeneration and spondylosis is present at C3-4, C4-5, C5-6, and C6-7. This is causing mild spinal stenosis  and foraminal encroachment due to diffuse uncinate spurring.  MRI THORACIC SPINE FINDINGS  Metastatic disease present throughout the thoracic spine involving all vertebral bodies and posterior elements. Bone marrow is diffusely heterogeneous. Bone marrow shows progression of heterogeneity and increased enhancement diffusely. This may be due to treatment effect or progression of tumor.  Mild epidural tumor on the right at T3 is unchanged. Small  amount of epidural tumor on the right at T10 is unchanged. Mild pathologic fractures involving T10, T11, and T12 are unchanged. Mild retropulsion of bone at T12 is unchanged. No new fracture.  Negative for cord compression.  Spinal cord signal is normal.  Central disc protrusion at T4-5 unchanged.  Left-sided osteophyte T5-6 unchanged.  Small central disc protrusion T6-7 unchanged.  Disc degeneration and spurring T12-L1 unchanged.  IMPRESSION: Widespread bony metastatic disease throughout the cervical spine, including the sternum and right clavicle. Negative for pathologic fracture or cord compression. 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  Widespread metastatic disease throughout the thoracic spine. Bone marrow is more heterogeneous and shows increased enhancement which may be related to treatment or progression of tumor.  Mild epidural tumor on the right at T3 and T10 unchanged. No cord compression.  Pathologic fractures at T10, T11, and T12 are unchanged from the prior MRI.   Electronically Signed   By: Franchot Gallo M.D.   On: 03/05/2014 15:09   Mr Thoracic Spine W Wo Contrast  03/05/2014   CLINICAL DATA:  Metastatic breast cancer. Numbness and tingling and pain. Rule out cord compression.  EXAM: MRI CERVICAL AND THORACIC SPINE WITHOUT AND WITH CONTRAST  TECHNIQUE: Multiplanar and multiecho pulse sequences of the cervical spine, to include the craniocervical junction and cervicothoracic junction, and thoracic spine, were obtained without and with intravenous contrast.  CONTRAST:  19m MULTIHANCE GADOBENATE DIMEGLUMINE 529 MG/ML IV SOLN  COMPARISON:  Thoracic MRI 10/09/2013  FINDINGS: MRI CERVICAL SPINE FINDINGS  Bone marrow signal is diffusely abnormal throughout the entire cervical spine compatible with widespread bony metastatic disease. This involves all vertebral bodies and posterior elements. In addition, there is metastatic disease in the sternum and right clavicle. Right  clavicular head is enlarged due to tumor.  Negative for pathologic fracture in the cervical spine. No epidural tumor or cord compression. Spinal cord signal is normal without evidence of edema or abnormal enhancement.  Disc degeneration and spondylosis is present at C3-4, C4-5, C5-6, and C6-7. This is causing mild spinal stenosis and foraminal encroachment due to diffuse uncinate spurring.  MRI THORACIC SPINE FINDINGS  Metastatic disease present throughout the thoracic spine involving all vertebral bodies and posterior elements. Bone marrow is diffusely heterogeneous. Bone marrow shows progression of heterogeneity and increased enhancement diffusely. This may be due to treatment effect or progression of tumor.  Mild epidural tumor on the right at T3 is unchanged. Small amount of epidural tumor on the right at T10 is unchanged. Mild pathologic fractures involving T10, T11, and T12 are unchanged. Mild retropulsion of bone at T12 is unchanged. No new fracture.  Negative for cord compression.  Spinal cord signal is normal.  Central disc protrusion at T4-5 unchanged.  Left-sided osteophyte T5-6 unchanged.  Small central disc protrusion T6-7 unchanged.  Disc degeneration and spurring T12-L1 unchanged.  IMPRESSION: Widespread bony metastatic disease throughout the cervical spine, including the sternum and right clavicle. Negative for pathologic fracture or cord compression. 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  Widespread metastatic disease throughout the thoracic spine. Bone marrow is more  heterogeneous and shows increased enhancement which may be related to treatment or progression of tumor.  Mild epidural tumor on the right at T3 and T10 unchanged. No cord compression.  Pathologic fractures at T10, T11, and T12 are unchanged from the prior MRI.   Electronically Signed   By: Franchot Gallo M.D.   On: 03/05/2014 15:09   Nm Pet Image Restag (ps) Skull Base To Thigh  03/05/2014   CLINICAL DATA:   Subsequent Treatment strategy for metastatic breast cancer.  EXAM: NUCLEAR MEDICINE PET SKULL BASE TO THIGH  TECHNIQUE: 9.2 mCi F-18 FDG was injected intravenously. Full-ring PET imaging was performed from the skull base to thigh after the radiotracer. CT data was obtained and used for attenuation correction and anatomic localization.  FASTING BLOOD GLUCOSE:  Value: 104 mg/dl  COMPARISON:  Multiple prior PET CTs.  The most recent is 11/27/2013  FINDINGS: NECK  No hypermetabolic lymph nodes in the neck.  CHEST  No hypermetabolic mediastinal or hilar nodes. No suspicious pulmonary nodules on the CT scan. No evidence of chest wall recurrence or axillary adenopathy. There are small bilateral pleural effusions and overlying atelectasis.  ABDOMEN/PELVIS  No abnormal hypermetabolic activity within the liver, pancreas, adrenal glands, or spleen. No hypermetabolic lymph nodes in the abdomen or pelvis.  SKELETON  Diffuse skeletal metastatic disease is again demonstrated. No significant change in the level of hypermetabolism in the vertebral bodies, ribs or extremity lesions. However, the thoracic spine is improved. Below T7 I do not see any persistent hypermetabolic changes and would suspect the patient had thoracic spine radiation. The lumbar and right sacrum areas appear stable. Only the L2 vertebral body demonstrates hypermetabolism. The hypermetabolic disease in the left pelvis appears relatively stable. The uptake in the left proximal femur has diminished. No new bone lesions.  IMPRESSION: Suspect radiation change in the thoracic spine area with resolution of hypermetabolic spine lesions below T7. The left proximal femur lesion also appears improved. The remaining metastatic disease appears relatively stable.  No findings for metastatic disease involving the lungs or liver.   Electronically Signed   By: Marijo Sanes M.D.   On: 03/05/2014 10:51    ASSESSMENT: 64 y.o. [place] woman   (1)  status post Left breast  and Left axillary lymph node biopsy Dec 2010 for a cT3 pN1 (stage IIIA) invasive ductal carcinoma, grade 2, estrogen and progesterone receptor positive, HER-2 negative, with an MIB-1 of 44%;   (2)  treated neoadjuvantly with dose dense cyclophosphamide and doxorubicin x4 followed by weekly paclitaxel x7  (3) s/p bilateral mastectomies May 2011. She had a residual 1.5 cm tumor, and one of 8 sampled lymph nodes was positive [ypT1c ypN1]. Margins were negative.   (4)  She completed post-mastectomy radiation in August of 2011   (5) on letrozole between August 2011 and October 2014  (6) PET scan 11/05/2012 obtained to evaluate right chest wall pain documents bony metastatic disease to the right seventh rib, L1, and the right acetabulum with an associated pathologic fracture through the anterior cortex of the acetabulum measuring 3.2 cm. There was no evidence of extra osseous metastatic disease.  (7) starting 11/05/2012 a received fulvestrant, 500 mg monthly, in addition to monthly infusions of zoledronic acid. The fulvestrant was discontinued May 2015 with evidence of progression  (8) Right rib, left hip and right hip were all treated to 37.5 Gy in 15 fractions at 2.5 Gy per fraction completed 12/17/2012  (9) 02/19/2013 bone marrow biopsy confirms metastatic disease to  bone. HER-2 was not done because of decaalcification of specimen. Estrogen and progesterone receptors were read as negative. This i was felt to be possibly discordant.  (10) patient's tumor carries a PIK3 mutation as documented at [place]  (11) L ribcage pain: s/p palliative XRT completed 06/16/2013  (12) declined participation in a capecitabine plus PIK 3 inhibitor at [facility]; started capecitabine alone 06/27/2013, discontinued after one week because of side effects;  (a) resumed 08/17/2013 at 500 mg twice a day one-week on one-week off  (b) dose being increased gradually, received 1000 mg BID with cycle  starting 09/28/2013  (c) full dose of 1545m BID started 10/12/13  (13) radiation to Right sacrum/ lower lumbar spine 06/10 to 06/23   (14) non-displaced fracture proximal right humerus secondary to a fall November 2015   PLAN:  PFraser [name] Is clinically slightly better and her scans show no obvious progression. Of course she has a significant burden of disease. Luckily it does not involve her liver or lungs.   She is interested in pursuing the possibility of epidural treatments under Dr. SWilford Gristand I favor that. Meanwhile we are continue the capecitabine and she will start her 13th dose cycle February 22. She will continue the hydromorphone and bowel prophylaxis as before. [facility] and of course we will continue to check her lab work at the beginning of each capecitabine cycle.   Incidentally her pain did not improve over the 24 hours when she was treated prophylactically with dexamethasone   PFraser [name] has a good understanding of the overall plan. She agrees with it. She knows the goal of treatment in her case  Is control. She will call with any problems that may develop before her next visit here.  MChauncey Cruel MD  03/10/2014

## 2014-03-11 NOTE — Addendum Note (Signed)
Addended by: Laureen Abrahams on: 03/11/2014 01:50 PM   Modules accepted: Orders, Medications

## 2014-03-18 ENCOUNTER — Ambulatory Visit: Payer: Medicare Other | Admitting: Obstetrics & Gynecology

## 2014-03-22 ENCOUNTER — Other Ambulatory Visit (HOSPITAL_BASED_OUTPATIENT_CLINIC_OR_DEPARTMENT_OTHER): Payer: Medicare Other

## 2014-03-22 ENCOUNTER — Encounter: Payer: Self-pay | Admitting: Nurse Practitioner

## 2014-03-22 ENCOUNTER — Other Ambulatory Visit: Payer: Self-pay | Admitting: *Deleted

## 2014-03-22 ENCOUNTER — Telehealth: Payer: Self-pay | Admitting: Nurse Practitioner

## 2014-03-22 ENCOUNTER — Ambulatory Visit (HOSPITAL_BASED_OUTPATIENT_CLINIC_OR_DEPARTMENT_OTHER): Payer: Medicare Other | Admitting: Nurse Practitioner

## 2014-03-22 ENCOUNTER — Ambulatory Visit (HOSPITAL_BASED_OUTPATIENT_CLINIC_OR_DEPARTMENT_OTHER): Payer: Medicare Other

## 2014-03-22 VITALS — BP 127/65 | HR 79 | Temp 98.4°F | Resp 18 | Ht 64.0 in | Wt 186.0 lb

## 2014-03-22 DIAGNOSIS — C50919 Malignant neoplasm of unspecified site of unspecified female breast: Secondary | ICD-10-CM

## 2014-03-22 DIAGNOSIS — G893 Neoplasm related pain (acute) (chronic): Secondary | ICD-10-CM

## 2014-03-22 DIAGNOSIS — C50512 Malignant neoplasm of lower-outer quadrant of left female breast: Secondary | ICD-10-CM

## 2014-03-22 DIAGNOSIS — C7951 Secondary malignant neoplasm of bone: Principal | ICD-10-CM

## 2014-03-22 DIAGNOSIS — C50912 Malignant neoplasm of unspecified site of left female breast: Secondary | ICD-10-CM

## 2014-03-22 DIAGNOSIS — M79603 Pain in arm, unspecified: Secondary | ICD-10-CM

## 2014-03-22 LAB — COMPREHENSIVE METABOLIC PANEL (CC13)
ALBUMIN: 3.5 g/dL (ref 3.5–5.0)
ALT: 17 U/L (ref 0–55)
AST: 27 U/L (ref 5–34)
Alkaline Phosphatase: 127 U/L (ref 40–150)
Anion Gap: 8 mEq/L (ref 3–11)
BUN: 13.2 mg/dL (ref 7.0–26.0)
CALCIUM: 9.6 mg/dL (ref 8.4–10.4)
CHLORIDE: 105 meq/L (ref 98–109)
CO2: 24 mEq/L (ref 22–29)
Creatinine: 0.8 mg/dL (ref 0.6–1.1)
EGFR: 76 mL/min/{1.73_m2} — ABNORMAL LOW (ref >90)
Glucose: 103 mg/dl (ref 70–140)
Potassium: 4.3 mEq/L (ref 3.5–5.1)
Sodium: 136 mEq/L (ref 136–145)
Total Bilirubin: 0.38 mg/dL (ref 0.20–1.20)
Total Protein: 6.2 g/dL — ABNORMAL LOW (ref 6.4–8.3)

## 2014-03-22 LAB — CBC WITH DIFFERENTIAL/PLATELET
BASO%: 0.7 % (ref 0.0–2.0)
Basophils Absolute: 0 10*3/uL (ref 0.0–0.1)
EOS ABS: 0.1 10*3/uL (ref 0.0–0.5)
EOS%: 2.1 % (ref 0.0–7.0)
HEMATOCRIT: 38.1 % (ref 34.8–46.6)
HGB: 12.6 g/dL (ref 11.6–15.9)
LYMPH#: 0.7 10*3/uL — AB (ref 0.9–3.3)
LYMPH%: 16.2 % (ref 14.0–49.7)
MCH: 31.7 pg (ref 25.1–34.0)
MCHC: 33.1 g/dL (ref 31.5–36.0)
MCV: 95.9 fL (ref 79.5–101.0)
MONO#: 0.6 10*3/uL (ref 0.1–0.9)
MONO%: 13.8 % (ref 0.0–14.0)
NEUT%: 67.2 % (ref 38.4–76.8)
NEUTROS ABS: 2.9 10*3/uL (ref 1.5–6.5)
Platelets: 191 10*3/uL (ref 145–400)
RBC: 3.98 10*6/uL (ref 3.70–5.45)
RDW: 15.9 % — ABNORMAL HIGH (ref 11.2–14.5)
WBC: 4.4 10*3/uL (ref 3.9–10.3)

## 2014-03-22 MED ORDER — HYDROMORPHONE HCL 4 MG PO TABS: 4.0000 mg | ORAL_TABLET | ORAL | Status: DC | PRN

## 2014-03-22 MED ORDER — VENLAFAXINE HCL ER 75 MG PO CP24: ORAL_CAPSULE | ORAL | Status: AC

## 2014-03-22 MED ORDER — DENOSUMAB 120 MG/1.7ML ~~LOC~~ SOLN
120.0000 mg | Freq: Once | SUBCUTANEOUS | Status: AC
  Administered 2014-03-22: 120 mg via SUBCUTANEOUS
  Filled 2014-03-22: qty 1.7

## 2014-03-22 MED ORDER — FENTANYL 25 MCG/HR TD PT72: 25.0000 ug | MEDICATED_PATCH | TRANSDERMAL | Status: DC

## 2014-03-22 NOTE — Telephone Encounter (Signed)
per pof to sch p[t appt-pt stated has MY CHART and will get updated appts

## 2014-03-22 NOTE — Progress Notes (Signed)
ID: Brooke [name]   DOB: 29-Aug-1950  MR#: [id number]  XLK#:[id number]  PCP: Irven Shelling, MD GYN: SU:  OTHER MD: Thea Silversmith, Gaynelle Arabian, [name] [phone]), Willa Rough DDS  CHIEF COMPLAINT: stage IV breast cancer CURRENT THERAPY: zolendronate, capecitabine  BREAST CANCER HISTORY: From the January 2011 summary:  Brooke [name] had screening mammography in August 04, 2007 which showed no specific mammographic evidence of malignancy, but she reported a left breast lump.  Accordingly, she was brought back on August 11, 2007 for mammography and ultrasonography.  There was indeed an obscured mass in the upper left breast which by ultrasound appeared to be a simple cyst.    Screening mammogram on August 04, 2008 showed in addition to dense breasts, again a possible mass in the left breast.  She had diagnostic mammography August 06, 2008 and additional views in addition to very dense breasts did not show any persistent mass or distortion.  Furthermore, Dr. Owens Shark was not able to palpate any abnormality in the lateral portion of the left breast.  Ultrasound showed normal appearing fibroglandular tissue in the area in question.    More recently, about September, the patient felt again something like a lump in the left breast and she had some pain associated with this.  This was initially felt simply to be mastalgia and was treated as such, but as it persisted, on December 23rd, the patient had left diagnostic mammography and ultrasonography.  There was a focally dense area in the left breast with very minimal distortion corresponding to the area of palpable abnormality. The ultrasound showed an irregular ill-defined, hypoechoic area with distal shadowing, measuring approximately 1 cm corresponding to the patient's palpable abnormality.    With this information, the patient was brought back for ultrasound-guided core biopsy and this was performed December 28th. 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  There was no evidence of angiolymphatic invasion in the material submitted which measured 1.5 cm maximally.  In addition to the mass in the breast, a lymph node in the left axilla was biopsied and was likewise positive.  The tumor was ER positive at 89%, PR positive at 34% with an elevated MIB-1 at 44% and Her-2 negative with a ratio of 1.13.    Her subsequent history is as detailed below.  INTERVAL HISTORY: Brooke [name] returns for follow up of her metastatic breast cancer, accompanied by her husband, Brooke [name]. She completed her 13th cycle of full dose capecitabine on 03/21/2014. This cycle she was unable to finish the last day and a half of treatment secondary to dizziness and nausea. The drug also causes some disorientation. She is frustrated by her inability to complete a full cycle minus just one dose as she has previously, but knows that it was unlikely that she would have kept the medicine down even if she forced herself. She is using compazine and zofran for her nausea. She denies palmar/plantar erythrodesthesia or mouth sores. She is also due for denosumab today, and denies any side effects that she is aware of.   REVIEW OF SYSTEMS: Brooke [name] denies fevers or chills. She is on miralax PRN but is not usually constipated despite her narcotic use. She is taking between 16-48m dilaudid daily and wears a 279mfentanyl patch. Her pain is only 2/10 today, though mobility at home is still an issue. She is short of breath with exertion and is fatigued in general for most of the day. She has a  mild dry cough. She endorses anxiety, but has felt better since her scans have showed stable disease. A detailed review of systems is otherwise stable.  PAST MEDICAL HISTORY: Past Medical History  Diagnosis Date  . Hx Breast Cancer, IDC, Stahe III, receptor + 10/20/2010    left breast  . Breast cancer December 2010    invasive ductal and invasive  lobular; bilateral mastectomy and radiation  . Mitral valve prolapse   . History of recurrent UTIs   . Radiation 07/26/09-09/08/09    left breast  . Fracture, pelvis closed 11/2012    both hipsand number 7 rib, 15 rounds radiation  . Metastasis to bone   1. Remote migraines, currently inactive.   2. History of prior diagnosis of fibromyalgia made at [facility] and currently not active (she was treated with trimethoprim and nortriptyline for one year for this).    3. History of recurrent UTIs.   4. History of benign left breast cyst aspirated in 2004 at [facility] under Dr. Melene Plan.   5. History of tonsillectomy and adenoidectomy.   6. History of right LASEK surgery. 7. History of bilateral reduction mammoplasties in 1994.   8. History of mitral valve prolapse diagnosed in 1984.  PAST SURGICAL HISTORY: Past Surgical History  Procedure Laterality Date  . Mastectomy Bilateral 2011    left breast cancer  . Lasik    . Reduction mammaplasty Bilateral 1994  . Tonsillectomy and adenoidectomy    . Aspiration of left breast Left 2004    [facility]Dr.Teo  . Bone biopsy  02/19/13    mets from breast cancer-receptors results not back    FAMILY HISTORY Family History  Problem Relation Age of Onset  . Lung cancer Father 64    smoker for 25 years  . Heart disease Maternal Grandmother   . Heart disease Maternal Grandfather   . Diabetes type I Mother   . Diabetes type I Sister   . Diabetes type I Sister   . Heart disease Maternal Uncle   . Uterine cancer Paternal Grandmother 2  . Breast cancer Other 71    paternal great grandmother  [name] died at the age of 62 from lung cancer.  He had been a remote smoker and had been in [employer] with a question of possible asbestos exposure.  [name] died at the age of 59 in the setting of Alzheimer's, coronary artery disease, diabetes and hypertension.  The patient had two  sisters.  One died from complications of diabetes.  The other one is alive with diabetes.    GYNECOLOGIC HISTORY: She is GX P1.  First pregnancy to term at age 48.  The patient's last period was in 2008.  She took hormone replacement for about 18 months until mid-2008.  She had no complications from that treatment.    SOCIAL HISTORY: (Updated October 2014) She used to be a Arts development officer for [employer]  They used to live in the [place] area. 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  Her husband, Brooke [name], retired October 2012.  He was a Arts development officer for 28 years.  Their son, [name], is currently with the [employer] at the [employer] in [place]. [name].  She attends [employer].   ADVANCED DIRECTIVES: in place  HEALTH MAINTENANCE: (Updated 11/19/2012) History  Substance Use Topics  . Smoking status: Former Smoker --  1.00 packs/day for 11 years    Quit date: 01/23/1984  . Smokeless tobacco: Never Used  . Alcohol Use: No     Colonoscopy: 2010  PAP: Jan 2014  Bone density: July 2014, osteopenia  Lipid panel: per Dr Valentina Lucks  Allergies  Allergen Reactions  . Sulfa Antibiotics Shortness Of Breath  . Codeine Nausea And Vomiting    Tolerates oxycodone if given with prochlorperazine for nausea  . Morphine And Related Nausea And Vomiting    Per patient possibility that the IR morphine was causing the vomiting   Current Outpatient Prescriptions on File Prior to Visit  Medication Sig Dispense Refill  . ALPRAZolam (XANAX XR) 1 MG 24 hr tablet Take 1 tablet (1 mg total) by mouth daily. 90 tablet 0  . Calcium Citrate (CITRACAL PO) Take 3 tablets by mouth daily.    . capecitabine (XELODA) 500 MG tablet Take 1,500 mg by mouth See admin instructions. Take twice daily after meals for 7 days then stop for 7 days    . cholecalciferol (VITAMIN D) 400 UNITS TABS tablet Take 400 Units by mouth daily. Patient  reportd the Vitamin E should be vitamin D with same dose    . diphenhydrAMINE (BENADRYL) 25 mg capsule Take 25 mg by mouth at bedtime as needed for sleep (sleep).     . gabapentin (NEURONTIN) 300 MG capsule TAKE ONE CAPSULE BY MOUTH AT BEDTIME 90 capsule 0  . ondansetron (ZOFRAN) 8 MG tablet Take 8 mg by mouth 2 (two) times daily as needed for nausea or vomiting (nausea & vomiting).    . polyethylene glycol (MIRALAX / GLYCOLAX) packet Take 17 g by mouth daily as needed for mild constipation.    . prochlorperazine (COMPAZINE) 5 MG tablet Take 1 tablet (5 mg total) by mouth every 6 (six) hours as needed for nausea or vomiting. 120 tablet 2   Current Facility-Administered Medications on File Prior to Visit  Medication Dose Route Frequency Provider Last Rate Last Dose  . LORazepam (ATIVAN) tablet 2 mg  2 mg Oral Once Chauncey Cruel, MD        OBJECTIVE: Middle-aged white woman sitting in a wheelchair. Poor mobility on feet  Filed Vitals:   03/22/14 1305  BP: 127/65  Pulse: 79  Temp: 98.4 F (36.9 C)  Resp: 18  Body mass index is 31.91 kg/(m^2).  ECOG 2  Skin: warm, dry  HEENT: sclerae anicteric, conjunctivae pink, oropharynx clear. No thrush or mucositis.  Lymph Nodes: No cervical or supraclavicular lymphadenopathy  Lungs: clear to auscultation bilaterally, no rales, wheezes, or rhonci  Heart: regular rate and rhythm  Abdomen: round, soft, non tender, positive bowel sounds  Musculoskeletal: No focal spinal tenderness, no peripheral edema  Neuro: non focal, well oriented, positive affect  Breasts: deferred   LAB RESULTS:  Lab Results  Component Value Date   WBC 4.4 03/22/2014   NEUTROABS 2.9 03/22/2014   HGB 12.6 03/22/2014   HCT 38.1 03/22/2014   MCV 95.9 03/22/2014   PLT 191 03/22/2014      Chemistry      Component Value Date/Time   NA 136 03/22/2014 1225   NA 141 03/29/2013 0350   NA 142 06/26/2011 0728   K 4.3 03/22/2014 1225   K 3.7 03/29/2013 0350   K 4.6  06/26/2011 0728   CL 111 03/29/2013 0350   CL 105 01/07/2012 1347   CL 102 06/26/2011 0728   CO2 24 03/22/2014 1225   CO2 18* 03/29/2013 0350  CO2 30 06/26/2011 0728   BUN 13.2 03/22/2014 1225   BUN 10 03/29/2013 0350   BUN 12 06/26/2011 0728   CREATININE 0.8 03/22/2014 1225   CREATININE 0.72 03/29/2013 0350   CREATININE 1.1 06/26/2011 0728      Component Value Date/Time   CALCIUM 9.6 03/22/2014 1225   CALCIUM 7.8* 03/29/2013 0350   CALCIUM 9.1 06/26/2011 0728   ALKPHOS 127 03/22/2014 1225   ALKPHOS 91 04/30/2013 1301   AST 27 03/22/2014 1225   AST 67* 04/30/2013 1301   ALT 17 03/22/2014 1225   ALT 93* 04/30/2013 1301   BILITOT 0.38 03/22/2014 1225   BILITOT 0.4 04/30/2013 1301     Results for HARLEI, LEHRMANN (MRN [id number]) as of 03/10/2014 17:32  Ref. Range 01/11/2014 12:40 01/25/2014 11:26 02/08/2014 10:50 02/22/2014 12:50 03/10/2014 15:12  Alkaline Phosphatase Latest Range: 39-117 U/L 200 (H) 158 (H) 127 121 128   Results for DAMIAN, HOFSTRA (MRN [id number]) as of 03/10/2014 17:32  Ref. Range 11/30/2013 11:02 12/28/2013 11:05 01/11/2014 12:40 01/25/2014 11:26 02/08/2014 10:50  CA 27.29 Latest Range: 0-39 U/mL 383 (H) 328 (H) 306 (H) 321 (H) 301 (H)  CEA Latest Range: 0.0-5.0 ng/mL 93.6 (H) 79.0 (H) 70.4 (H) 68.2 (H) 72.2 (H)   STUDIES: Mr Cervical Spine W Wo Contrast  03/05/2014   CLINICAL DATA:  Metastatic breast cancer. Numbness and tingling and pain. Rule out cord compression.  EXAM: MRI CERVICAL AND THORACIC SPINE WITHOUT AND WITH CONTRAST  TECHNIQUE: Multiplanar and multiecho pulse sequences of the cervical spine, to include the craniocervical junction and cervicothoracic junction, and thoracic spine, were obtained without and with intravenous contrast.  CONTRAST:  59m MULTIHANCE GADOBENATE DIMEGLUMINE 529 MG/ML IV SOLN  COMPARISON:  Thoracic MRI 10/09/2013  FINDINGS: MRI CERVICAL SPINE FINDINGS  Bone marrow signal is diffusely abnormal throughout the entire cervical spine  compatible with widespread bony metastatic disease. This involves all vertebral bodies and posterior elements. In addition, there is metastatic disease in the sternum and right clavicle. Right clavicular head is enlarged due to tumor.  Negative for pathologic fracture in the cervical spine. No epidural tumor or cord compression. Spinal cord signal is normal without evidence of edema or abnormal enhancement.  Disc degeneration and spondylosis is present at C3-4, C4-5, C5-6, and C6-7. This is causing mild spinal stenosis and foraminal encroachment due to diffuse uncinate spurring.  MRI THORACIC SPINE FINDINGS  Metastatic disease present throughout the thoracic spine involving all vertebral bodies and posterior elements. Bone marrow is diffusely heterogeneous. Bone marrow shows progression of heterogeneity and increased enhancement diffusely. This may be due to treatment effect or progression of tumor.  Mild epidural tumor on the right at T3 is unchanged. Small amount of epidural tumor on the right at T10 is unchanged. Mild pathologic fractures involving T10, T11, and T12 are unchanged. Mild retropulsion of bone at T12 is unchanged. No new fracture.  Negative for cord compression.  Spinal cord signal is normal.  Central disc protrusion at T4-5 unchanged.  Left-sided osteophyte T5-6 unchanged.  Small central disc protrusion T6-7 unchanged.  Disc degeneration and spurring T12-L1 unchanged.  IMPRESSION: Widespread bony metastatic disease throughout the cervical spine, including the sternum and right clavicle. Negative for pathologic fracture or cord compression. 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  Widespread metastatic disease throughout the thoracic spine. Bone marrow is more heterogeneous and shows increased enhancement which may be related to treatment or progression of tumor.  Mild epidural tumor on the right at  T3 and T10 unchanged. No cord compression.  Pathologic fractures at T10, T11,  and T12 are unchanged from the prior MRI.   Electronically Signed   By: Franchot Gallo M.D.   On: 03/05/2014 15:09   Mr Thoracic Spine W Wo Contrast  03/05/2014   CLINICAL DATA:  Metastatic breast cancer. Numbness and tingling and pain. Rule out cord compression.  EXAM: MRI CERVICAL AND THORACIC SPINE WITHOUT AND WITH CONTRAST  TECHNIQUE: Multiplanar and multiecho pulse sequences of the cervical spine, to include the craniocervical junction and cervicothoracic junction, and thoracic spine, were obtained without and with intravenous contrast.  CONTRAST:  47m MULTIHANCE GADOBENATE DIMEGLUMINE 529 MG/ML IV SOLN  COMPARISON:  Thoracic MRI 10/09/2013  FINDINGS: MRI CERVICAL SPINE FINDINGS  Bone marrow signal is diffusely abnormal throughout the entire cervical spine compatible with widespread bony metastatic disease. This involves all vertebral bodies and posterior elements. In addition, there is metastatic disease in the sternum and right clavicle. Right clavicular head is enlarged due to tumor.  Negative for pathologic fracture in the cervical spine. No epidural tumor or cord compression. Spinal cord signal is normal without evidence of edema or abnormal enhancement.  Disc degeneration and spondylosis is present at C3-4, C4-5, C5-6, and C6-7. This is causing mild spinal stenosis and foraminal encroachment due to diffuse uncinate spurring.  MRI THORACIC SPINE FINDINGS  Metastatic disease present throughout the thoracic spine involving all vertebral bodies and posterior elements. Bone marrow is diffusely heterogeneous. Bone marrow shows progression of heterogeneity and increased enhancement diffusely. This may be due to treatment effect or progression of tumor.  Mild epidural tumor on the right at T3 is unchanged. Small amount of epidural tumor on the right at T10 is unchanged. Mild pathologic fractures involving T10, T11, and T12 are unchanged. Mild retropulsion of bone at T12 is unchanged. No new fracture.   Negative for cord compression.  Spinal cord signal is normal.  Central disc protrusion at T4-5 unchanged.  Left-sided osteophyte T5-6 unchanged.  Small central disc protrusion T6-7 unchanged.  Disc degeneration and spurring T12-L1 unchanged.  IMPRESSION: Widespread bony metastatic disease throughout the cervical spine, including the sternum and right clavicle. Negative for pathologic fracture or cord compression. 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  Widespread metastatic disease throughout the thoracic spine. Bone marrow is more heterogeneous and shows increased enhancement which may be related to treatment or progression of tumor.  Mild epidural tumor on the right at T3 and T10 unchanged. No cord compression.  Pathologic fractures at T10, T11, and T12 are unchanged from the prior MRI.   Electronically Signed   By: CFranchot GalloM.D.   On: 03/05/2014 15:09   Nm Pet Image Restag (ps) Skull Base To Thigh  03/05/2014   CLINICAL DATA:  Subsequent Treatment strategy for metastatic breast cancer.  EXAM: NUCLEAR MEDICINE PET SKULL BASE TO THIGH  TECHNIQUE: 9.2 mCi F-18 FDG was injected intravenously. Full-ring PET imaging was performed from the skull base to thigh after the radiotracer. CT data was obtained and used for attenuation correction and anatomic localization.  FASTING BLOOD GLUCOSE:  Value: 104 mg/dl  COMPARISON:  Multiple prior PET CTs.  The most recent is 11/27/2013  FINDINGS: NECK  No hypermetabolic lymph nodes in the neck.  CHEST  No hypermetabolic mediastinal or hilar nodes. No suspicious pulmonary nodules on the CT scan. No evidence of chest wall recurrence or axillary adenopathy. There are small bilateral pleural effusions and overlying atelectasis.  ABDOMEN/PELVIS  No abnormal  hypermetabolic activity within the liver, pancreas, adrenal glands, or spleen. No hypermetabolic lymph nodes in the abdomen or pelvis.  SKELETON  Diffuse skeletal metastatic disease is again  demonstrated. No significant change in the level of hypermetabolism in the vertebral bodies, ribs or extremity lesions. However, the thoracic spine is improved. Below T7 I do not see any persistent hypermetabolic changes and would suspect the patient had thoracic spine radiation. The lumbar and right sacrum areas appear stable. Only the L2 vertebral body demonstrates hypermetabolism. The hypermetabolic disease in the left pelvis appears relatively stable. The uptake in the left proximal femur has diminished. No new bone lesions.  IMPRESSION: Suspect radiation change in the thoracic spine area with resolution of hypermetabolic spine lesions below T7. The left proximal femur lesion also appears improved. The remaining metastatic disease appears relatively stable.  No findings for metastatic disease involving the lungs or liver.   Electronically Signed   By: Marijo Sanes M.D.   On: 03/05/2014 10:51    ASSESSMENT: 64 y.o. [place] woman   (1)  status post Left breast and Left axillary lymph node biopsy Dec 2010 for a cT3 pN1 (stage IIIA) invasive ductal carcinoma, grade 2, estrogen and progesterone receptor positive, HER-2 negative, with an MIB-1 of 44%;   (2)  treated neoadjuvantly with dose dense cyclophosphamide and doxorubicin x4 followed by weekly paclitaxel x7  (3) s/p bilateral mastectomies May 2011. She had a residual 1.5 cm tumor, and one of 8 sampled lymph nodes was positive [ypT1c ypN1]. Margins were negative.   (4)  She completed post-mastectomy radiation in August of 2011   (5) on letrozole between August 2011 and October 2014  (6) PET scan 11/05/2012 obtained to evaluate right chest wall pain documents bony metastatic disease to the right seventh rib, L1, and the right acetabulum with an associated pathologic fracture through the anterior cortex of the acetabulum measuring 3.2 cm. There was no evidence of extra osseous metastatic disease.  (7) starting 11/05/2012 a received fulvestrant,  500 mg monthly, in addition to monthly infusions of zoledronic acid. The fulvestrant was discontinued May 2015 with evidence of progression  (8) Right rib, left hip and right hip were all treated to 37.5 Gy in 15 fractions at 2.5 Gy per fraction completed 12/17/2012  (9) 02/19/2013 bone marrow biopsy confirms metastatic disease to bone. HER-2 was not done because of decaalcification of specimen. Estrogen and progesterone receptors were read as negative. This i was felt to be possibly discordant.  (10) patient's tumor carries a PIK3 mutation as documented at [facility]  (11) L ribcage pain: s/p palliative XRT completed 06/16/2013  (12) declined participation in a capecitabine plus PIK 3 inhibitor at [facility]; started capecitabine alone 06/27/2013, discontinued after one week because of side effects;  (a) resumed 08/17/2013 at 500 mg twice a day one-week on one-week off  (b) dose being increased gradually, received 1000 mg BID with cycle starting 09/28/2013  (c) full dose of 153m BID started 10/12/13  (13) radiation to Right sacrum/ lower lumbar spine 06/10 to 06/23   (14) non-displaced fracture proximal right humerus secondary to a fall November 2015   PLAN:  PFraser Dinis doing moderately well today. The CBC and CMET were reviewed in detail and were entirely stable. Her CEA and CA 27.29 on the other hand are demonstrating a mild elevation. She will continue on the full dose capecitabine, with cycle 14 starting this upcoming Monday. She will receive her next denosumab injection immediately after the conclusion  of our visit.   I have refilled her dilaudid and fentanyl patches today.   Brooke [name] will return in 2 weeks for labs and a follow up visit. She understands and agrees with this plan. She knows the goal of treatment in her case is control. She has been encouraged to call with any issues that might arise before her next visit here.   Marcelino Duster, NP  03/22/2014

## 2014-04-05 ENCOUNTER — Ambulatory Visit (HOSPITAL_BASED_OUTPATIENT_CLINIC_OR_DEPARTMENT_OTHER): Payer: Medicare Other | Admitting: Nurse Practitioner

## 2014-04-05 ENCOUNTER — Encounter: Payer: Self-pay | Admitting: Nurse Practitioner

## 2014-04-05 ENCOUNTER — Other Ambulatory Visit (HOSPITAL_BASED_OUTPATIENT_CLINIC_OR_DEPARTMENT_OTHER): Payer: Medicare Other

## 2014-04-05 VITALS — BP 124/57 | HR 84 | Temp 97.8°F | Resp 18 | Ht 64.0 in | Wt 186.2 lb

## 2014-04-05 DIAGNOSIS — C773 Secondary and unspecified malignant neoplasm of axilla and upper limb lymph nodes: Secondary | ICD-10-CM | POA: Diagnosis not present

## 2014-04-05 DIAGNOSIS — R51 Headache: Secondary | ICD-10-CM | POA: Diagnosis not present

## 2014-04-05 DIAGNOSIS — R519 Headache, unspecified: Secondary | ICD-10-CM

## 2014-04-05 DIAGNOSIS — C50919 Malignant neoplasm of unspecified site of unspecified female breast: Secondary | ICD-10-CM

## 2014-04-05 DIAGNOSIS — C50512 Malignant neoplasm of lower-outer quadrant of left female breast: Secondary | ICD-10-CM

## 2014-04-05 DIAGNOSIS — G893 Neoplasm related pain (acute) (chronic): Secondary | ICD-10-CM

## 2014-04-05 DIAGNOSIS — M79603 Pain in arm, unspecified: Secondary | ICD-10-CM

## 2014-04-05 DIAGNOSIS — C7951 Secondary malignant neoplasm of bone: Secondary | ICD-10-CM | POA: Diagnosis not present

## 2014-04-05 LAB — COMPREHENSIVE METABOLIC PANEL (CC13)
ALBUMIN: 3.7 g/dL (ref 3.5–5.0)
ALT: 26 U/L (ref 0–55)
AST: 30 U/L (ref 5–34)
Alkaline Phosphatase: 122 U/L (ref 40–150)
Anion Gap: 9 mEq/L (ref 3–11)
BUN: 12.6 mg/dL (ref 7.0–26.0)
CO2: 24 mEq/L (ref 22–29)
Calcium: 9.9 mg/dL (ref 8.4–10.4)
Chloride: 102 mEq/L (ref 98–109)
Creatinine: 0.9 mg/dL (ref 0.6–1.1)
EGFR: 69 mL/min/{1.73_m2} — ABNORMAL LOW (ref >90)
Glucose: 109 mg/dl (ref 70–140)
POTASSIUM: 4.3 meq/L (ref 3.5–5.1)
SODIUM: 136 meq/L (ref 136–145)
TOTAL PROTEIN: 6.6 g/dL (ref 6.4–8.3)
Total Bilirubin: 0.4 mg/dL (ref 0.20–1.20)

## 2014-04-05 LAB — CBC WITH DIFFERENTIAL/PLATELET
BASO%: 0.3 % (ref 0.0–2.0)
Basophils Absolute: 0 10*3/uL (ref 0.0–0.1)
EOS ABS: 0.1 10*3/uL (ref 0.0–0.5)
EOS%: 1.3 % (ref 0.0–7.0)
HCT: 39.3 % (ref 34.8–46.6)
HGB: 13.3 g/dL (ref 11.6–15.9)
LYMPH%: 21.6 % (ref 14.0–49.7)
MCH: 32 pg (ref 25.1–34.0)
MCHC: 33.8 g/dL (ref 31.5–36.0)
MCV: 94.5 fL (ref 79.5–101.0)
MONO#: 0.7 10*3/uL (ref 0.1–0.9)
MONO%: 17.3 % — AB (ref 0.0–14.0)
NEUT#: 2.4 10*3/uL (ref 1.5–6.5)
NEUT%: 59.5 % (ref 38.4–76.8)
Platelets: 156 10*3/uL (ref 145–400)
RBC: 4.16 10*6/uL (ref 3.70–5.45)
RDW: 14.4 % (ref 11.2–14.5)
WBC: 4 10*3/uL (ref 3.9–10.3)
lymph#: 0.9 10*3/uL (ref 0.9–3.3)

## 2014-04-05 NOTE — Progress Notes (Signed)
ID: Brooke [name]   DOB: [birth year]  MR#: [id number]  OQH#:[id number]  PCP: Irven Shelling, MD GYN: SU:  OTHER MD: Thea Silversmith, Gaynelle Arabian, [name] [phone]), Willa Rough DDS  CHIEF COMPLAINT: stage IV breast cancer CURRENT THERAPY: zolendronate, capecitabine  BREAST CANCER HISTORY: From the January 2011 summary:  Brooke [name] had screening mammography in August 04, 2007 which showed no specific mammographic evidence of malignancy, but she reported a left breast lump.  Accordingly, she was brought back on August 11, 2007 for mammography and ultrasonography.  There was indeed an obscured mass in the upper left breast which by ultrasound appeared to be a simple cyst.    Screening mammogram on August 04, 2008 showed in addition to dense breasts, again a possible mass in the left breast.  She had diagnostic mammography August 06, 2008 and additional views in addition to very dense breasts did not show any persistent mass or distortion.  Furthermore, Dr. Owens Shark was not able to palpate any abnormality in the lateral portion of the left breast.  Ultrasound showed normal appearing fibroglandular tissue in the area in question.    More recently, about September, the patient felt again something like a lump in the left breast and she had some pain associated with this.  This was initially felt simply to be mastalgia and was treated as such, but as it persisted, on December 23rd, the patient had left diagnostic mammography and ultrasonography.  There was a focally dense area in the left breast with very minimal distortion corresponding to the area of palpable abnormality. The ultrasound showed an irregular ill-defined, hypoechoic area with distal shadowing, measuring approximately 1 cm corresponding to the patient's palpable abnormality.    With this information, the patient was brought back for ultrasound-guided core biopsy and this was performed December 28th. 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  There was no evidence of angiolymphatic invasion in the material submitted which measured 1.5 cm maximally.  In addition to the mass in the breast, a lymph node in the left axilla was biopsied and was likewise positive.  The tumor was ER positive at 89%, PR positive at 34% with an elevated MIB-1 at 44% and Her-2 negative with a ratio of 1.13.    Her subsequent history is as detailed below.  INTERVAL HISTORY: Brooke [name] returns for follow up of her metastatic breast cancer, accompanied by her husband, [name]. She completed her 14th cycle of full dose capecitabine on 04/04/2014. This cycle she was unable to finish a total of a last day and a half of treatment secondary to dizziness. She did not have the evening dose on day 4, or any doses at all on day 7. The drug also causes some disorientation. She denies palmar/plantar erythrodesthesia or mouth sores. This cycle she has not been particularly nauseous and has needed neither compazine nor zofran this past week. New to her are headaches that start frontally, and tingling to the back of her head over the course of 15 minutes or so. They happen typically in the morning, but can appear at any time really.  REVIEW OF SYSTEMS: Brooke [name] denies fevers or chills. She is on miralax PRN but is not usually constipated despite her narcotic use. Her pain has been less intense recently, and is only 2/10 today. She is down to taking just 54m dilaudid daily and wears a 276mfentanyl patch. She is short of breath with exertion and is fatigued in  general for most of the day. She has a mild dry cough. She endorses anxiety and depression. A detailed review of systems is otherwise stable.  PAST MEDICAL HISTORY: Past Medical History  Diagnosis Date  . Hx Breast Cancer, IDC, Stahe III, receptor + 10/20/2010    left breast  . Breast cancer December 2010    invasive ductal and invasive lobular; bilateral  mastectomy and radiation  . Mitral valve prolapse   . History of recurrent UTIs   . Radiation 07/26/09-09/08/09    left breast  . Fracture, pelvis closed 11/2012    both hipsand number 7 rib, 15 rounds radiation  . Metastasis to bone   1. Remote migraines, currently inactive.   2. History of prior diagnosis of fibromyalgia made at [facility] and currently not active (she was treated with trimethoprim and nortriptyline for one year for this).    3. History of recurrent UTIs.   4. History of benign left breast cyst aspirated in 2004 at [facility] under Dr. Melene Plan.   5. History of tonsillectomy and adenoidectomy.   6. History of right LASEK surgery. 7. History of bilateral reduction mammoplasties in 1994.   8. History of mitral valve prolapse diagnosed in 1984.  PAST SURGICAL HISTORY: Past Surgical History  Procedure Laterality Date  . Mastectomy Bilateral 2011    left breast cancer  . Lasik    . Reduction mammaplasty Bilateral 1994  . Tonsillectomy and adenoidectomy    . Aspiration of left breast Left 2004    [facility]/Dr.Teo  . Bone biopsy  02/19/13    mets from breast cancer-receptors results not back    FAMILY HISTORY Family History  Problem Relation Age of Onset  . Lung cancer Father 43    smoker for 25 years  . Heart disease Maternal Grandmother   . Heart disease Maternal Grandfather   . Diabetes type I Mother   . Diabetes type I Sister   . Diabetes type I Sister   . Heart disease Maternal Uncle   . Uterine cancer Paternal Grandmother 89  . Breast cancer Other 33    paternal great grandmother  [name] died at the age of 26 from lung cancer.  He had been a remote smoker and had been in [employer] with a question of possible asbestos exposure.  [name] died at the age of 41 in the setting of Alzheimer's, coronary artery disease, diabetes and hypertension.  The patient had two sisters.  One died from  complications of diabetes.  The other one is alive with diabetes.    GYNECOLOGIC HISTORY: She is GX P1.  First pregnancy to term at age 64.  The patient's last period was in 2008.  She took hormone replacement for about 18 months until mid-2008.  She had no complications from that treatment.    SOCIAL HISTORY: (Updated October 2014) She used to be a Arts development officer for [employer]  They used to live in the [place] area. 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  Her husband, [name], retired October 2012.  He was a Arts development officer for 28 years.  Their son, [name], is currently with the [employer] at the [employer] in [place]. [name].  She attends [employer].   ADVANCED DIRECTIVES: in place  HEALTH MAINTENANCE: (Updated 11/19/2012) History  Substance Use Topics  . Smoking status: Former Smoker --  1.00 packs/day for 11 years    Quit date: 01/23/1984  . Smokeless tobacco: Never Used  . Alcohol Use: No     Colonoscopy: 2010  PAP: Jan 2014  Bone density: July 2014, osteopenia  Lipid panel: per Dr Valentina Lucks  Allergies  Allergen Reactions  . Sulfa Antibiotics Shortness Of Breath  . Codeine Nausea And Vomiting    Tolerates oxycodone if given with prochlorperazine for nausea  . Morphine And Related Nausea And Vomiting    Per patient possibility that the IR morphine was causing the vomiting   Current Outpatient Prescriptions on File Prior to Visit  Medication Sig Dispense Refill  . ALPRAZolam (XANAX XR) 1 MG 24 hr tablet Take 1 tablet (1 mg total) by mouth daily. 90 tablet 0  . Calcium Citrate (CITRACAL PO) Take 3 tablets by mouth daily.    . capecitabine (XELODA) 500 MG tablet Take 1,500 mg by mouth See admin instructions. Take twice daily after meals for 7 days then stop for 7 days    . cholecalciferol (VITAMIN D) 400 UNITS TABS tablet Take 400 Units by mouth daily. Patient reportd the Vitamin E  should be vitamin D with same dose    . diphenhydrAMINE (BENADRYL) 25 mg capsule Take 25 mg by mouth at bedtime as needed for sleep (sleep).     . fentaNYL (DURAGESIC - DOSED MCG/HR) 25 MCG/HR patch Place 1 patch (25 mcg total) onto the skin every 3 (three) days. 20 patch 0  . gabapentin (NEURONTIN) 300 MG capsule TAKE ONE CAPSULE BY MOUTH AT BEDTIME 90 capsule 0  . HYDROmorphone (DILAUDID) 4 MG tablet Take 1-2 tablets (4-8 mg total) by mouth every 4 (four) hours as needed for severe pain (pain). 240 tablet 0  . polyethylene glycol (MIRALAX / GLYCOLAX) packet Take 17 g by mouth daily as needed for mild constipation.    [name] Kitchen venlafaxine XR (EFFEXOR-XR) 75 MG 24 hr capsule TAKE 2 CAPSULES (150MG)    DAILY WITH BREAKFAST 180 capsule 0  . ondansetron (ZOFRAN) 8 MG tablet Take 8 mg by mouth 2 (two) times daily as needed for nausea or vomiting (nausea & vomiting).    . prochlorperazine (COMPAZINE) 5 MG tablet Take 1 tablet (5 mg total) by mouth every 6 (six) hours as needed for nausea or vomiting. (Patient not taking: Reported on 04/05/2014) 120 tablet 2   Current Facility-Administered Medications on File Prior to Visit  Medication Dose Route Frequency Provider Last Rate Last Dose  . LORazepam (ATIVAN) tablet 2 mg  2 mg Oral Once Chauncey Cruel, MD        OBJECTIVE: Middle-aged white woman sitting in a wheelchair. Poor mobility on feet  Filed Vitals:   04/05/14 1430  BP: 124/57  Pulse: 84  Temp: 97.8 F (36.6 C)  Resp: 18  Body mass index is 31.95 kg/(m^2).  ECOG 2  Skin: warm, dry  HEENT: sclerae anicteric, conjunctivae pink, oropharynx clear. No thrush or mucositis.  Lymph Nodes: No cervical or supraclavicular lymphadenopathy  Lungs: clear to auscultation bilaterally, no rales, wheezes, or rhonci  Heart: regular rate and rhythm  Abdomen: round, soft, non tender, positive bowel sounds  Musculoskeletal: No focal spinal tenderness, no peripheral edema  Neuro: non focal, well oriented,  positive affect  Breasts: deferred   LAB RESULTS:  Lab Results  Component Value Date   WBC 4.0 04/05/2014   NEUTROABS 2.4 04/05/2014   HGB 13.3 04/05/2014   HCT 39.3 04/05/2014   MCV 94.5 04/05/2014  PLT 156 04/05/2014      Chemistry      Component Value Date/Time   NA 136 04/05/2014 1341   NA 141 03/29/2013 0350   NA 142 06/26/2011 0728   K 4.3 04/05/2014 1341   K 3.7 03/29/2013 0350   K 4.6 06/26/2011 0728   CL 111 03/29/2013 0350   CL 105 01/07/2012 1347   CL 102 06/26/2011 0728   CO2 24 04/05/2014 1341   CO2 18* 03/29/2013 0350   CO2 30 06/26/2011 0728   BUN 12.6 04/05/2014 1341   BUN 10 03/29/2013 0350   BUN 12 06/26/2011 0728   CREATININE 0.9 04/05/2014 1341   CREATININE 0.72 03/29/2013 0350   CREATININE 1.1 06/26/2011 0728      Component Value Date/Time   CALCIUM 9.9 04/05/2014 1341   CALCIUM 7.8* 03/29/2013 0350   CALCIUM 9.1 06/26/2011 0728   ALKPHOS 122 04/05/2014 1341   ALKPHOS 91 04/30/2013 1301   AST 30 04/05/2014 1341   AST 67* 04/30/2013 1301   ALT 26 04/05/2014 1341   ALT 93* 04/30/2013 1301   BILITOT 0.40 04/05/2014 1341   BILITOT 0.4 04/30/2013 1301     Results for JANE, BIRKEL (MRN [id number]) as of 03/10/2014 17:32  Ref. Range 01/11/2014 12:40 01/25/2014 11:26 02/08/2014 10:50 02/22/2014 12:50 03/10/2014 15:12  Alkaline Phosphatase Latest Range: 39-117 U/L 200 (H) 158 (H) 127 121 128   Results for LARIYA, KINZIE (MRN [id number]) as of 03/10/2014 17:32  Ref. Range 11/30/2013 11:02 12/28/2013 11:05 01/11/2014 12:40 01/25/2014 11:26 02/08/2014 10:50  CA 27.29 Latest Range: 0-39 U/mL 383 (H) 328 (H) 306 (H) 321 (H) 301 (H)  CEA Latest Range: 0.0-5.0 ng/mL 93.6 (H) 79.0 (H) 70.4 (H) 68.2 (H) 72.2 (H)   STUDIES: No results found.  ASSESSMENT: 64 y.o. [place] woman   (1)  status post Left breast and Left axillary lymph node biopsy Dec 2010 for a cT3 pN1 (stage IIIA) invasive ductal carcinoma, grade 2, estrogen and progesterone receptor  positive, HER-2 negative, with an MIB-1 of 44%;   (2)  treated neoadjuvantly with dose dense cyclophosphamide and doxorubicin x4 followed by weekly paclitaxel x7  (3) s/p bilateral mastectomies May 2011. She had a residual 1.5 cm tumor, and one of 8 sampled lymph nodes was positive [ypT1c ypN1]. Margins were negative.   (4)  She completed post-mastectomy radiation in August of 2011   (5) on letrozole between August 2011 and October 2014  (6) PET scan 11/05/2012 obtained to evaluate right chest wall pain documents bony metastatic disease to the right seventh rib, L1, and the right acetabulum with an associated pathologic fracture through the anterior cortex of the acetabulum measuring 3.2 cm. There was no evidence of extra osseous metastatic disease.  (7) starting 11/05/2012 a received fulvestrant, 500 mg monthly, in addition to monthly infusions of zoledronic acid. The fulvestrant was discontinued May 2015 with evidence of progression  (8) Right rib, left hip and right hip were all treated to 37.5 Gy in 15 fractions at 2.5 Gy per fraction completed 12/17/2012  (9) 02/19/2013 bone marrow biopsy confirms metastatic disease to bone. HER-2 was not done because of decaalcification of specimen. Estrogen and progesterone receptors were read as negative. This i was felt to be possibly discordant.  (10) patient's tumor carries a PIK3 mutation as documented at [facility]  (11) L ribcage pain: s/p palliative XRT completed 06/16/2013  (12) declined participation in a capecitabine plus PIK 3 inhibitor at [facility]; started capecitabine  alone 06/27/2013, discontinued after one week because of side effects;  (a) resumed 08/17/2013 at 500 mg twice a day one-week on one-week off  (b) dose being increased gradually, received 1000 mg BID with cycle starting 09/28/2013  (c) full dose of 1558m BID started 10/12/13  (13) radiation to Right sacrum/ lower lumbar spine 06/10 to 06/23   (14)  non-displaced fracture proximal right humerus secondary to a fall November 2015   PLAN:  PFraser Dinwonders if she would benefit from a "drug holiday" with this next cycle of capecitabine. Dr. MJana Hakimshared the visit, and he suggested moving forward with the current dose and regimen of this drug because it has been working. She should press through as best she can to elicit the maximum benefit of this treatment, even if she is only able to complete 5.5 out of 7 days.  In the meantime she will continue to wean down her narcotic use as it becomes more possible to do so. I have written orders for a brain MRI to evaluate the source of her headaches.   PFraser Dinwill return in 2 weeks for labs and a follow up visit. At this time she will also be due for her zometa injection. She understands and agrees with this plan. She know the goal of treatment in her case is control. She has been encouraged to call with any issues that might arise before her next visit here.  HLaurie Panda NP  04/05/2014   ADDENDUM: PFraser Dinwas really hopeful she could have a "vacation" from her capecitabine. If she were failing I would comply, but she is actually improving, with less pain and a little more mobility-- she c/o dizzyness due to the medication but i can't say the capecitabine is causing that. I urged her to stay on the medication, to continue to try to very slowly decreasse ehr use of dilauded, and to have a brain MRI which has been requested. She was very disappointed but agreed. She will see me 3/30 and we will discuss this further.  I personally saw this patient and performed a substantive portion of this encounter with the listed APP documented above.   MChauncey Cruel MD Medical Oncology and Hematology CLong Island Ambulatory Surgery [facility] LLC5[address] Bank StreetATunica Resorts [place] [id number]. [phone]   Fax. [phone]

## 2014-04-06 ENCOUNTER — Encounter: Payer: Self-pay | Admitting: Nurse Practitioner

## 2014-04-06 ENCOUNTER — Telehealth: Payer: Self-pay | Admitting: Oncology

## 2014-04-06 LAB — CEA: CEA: 93.6 ng/mL — ABNORMAL HIGH (ref 0.0–5.0)

## 2014-04-06 LAB — CANCER ANTIGEN 27.29: CA 27.29: 359 U/mL — AB (ref 0–39)

## 2014-04-06 NOTE — Telephone Encounter (Signed)
spoke with [name] and he is aware of th new appts per pof and he will call [facility] to schedule the scan   Brooke [name]

## 2014-04-07 NOTE — Addendum Note (Signed)
Addended by: Marcelino Duster on: 04/07/2014 06:40 PM   Modules accepted: Orders

## 2014-04-16 ENCOUNTER — Other Ambulatory Visit: Payer: Self-pay | Admitting: Oncology

## 2014-04-20 ENCOUNTER — Ambulatory Visit
Admission: RE | Admit: 2014-04-20 | Discharge: 2014-04-20 | Disposition: A | Discharge status: Home / Self Care | Payer: Medicare Other | Source: Ambulatory Visit | Attending: Nurse Practitioner | Admitting: Nurse Practitioner

## 2014-04-20 ENCOUNTER — Telehealth: Payer: Self-pay | Admitting: *Deleted

## 2014-04-20 ENCOUNTER — Other Ambulatory Visit: Payer: Self-pay | Admitting: Nurse Practitioner

## 2014-04-20 DIAGNOSIS — R519 Headache, unspecified: Secondary | ICD-10-CM

## 2014-04-20 DIAGNOSIS — R51 Headache: Principal | ICD-10-CM

## 2014-04-20 MED ORDER — GADOBENATE DIMEGLUMINE 529 MG/ML IV SOLN: 15.0000 mL | Freq: Once | INTRAVENOUS | Status: AC | PRN

## 2014-04-20 NOTE — Telephone Encounter (Signed)
THE RESULTS WERE CALLED AND FAXED. THE REPORT WAS TAKEN TO HEATHER BOELTER,NP.

## 2014-04-21 ENCOUNTER — Ambulatory Visit (HOSPITAL_BASED_OUTPATIENT_CLINIC_OR_DEPARTMENT_OTHER): Payer: Medicare Other | Admitting: Oncology

## 2014-04-21 ENCOUNTER — Other Ambulatory Visit (HOSPITAL_BASED_OUTPATIENT_CLINIC_OR_DEPARTMENT_OTHER): Payer: Medicare Other

## 2014-04-21 ENCOUNTER — Ambulatory Visit (HOSPITAL_BASED_OUTPATIENT_CLINIC_OR_DEPARTMENT_OTHER): Payer: Medicare Other

## 2014-04-21 VITALS — BP 138/83 | HR 83 | Temp 98.7°F | Resp 18 | Ht 64.0 in | Wt 183.8 lb

## 2014-04-21 DIAGNOSIS — C7951 Secondary malignant neoplasm of bone: Secondary | ICD-10-CM

## 2014-04-21 DIAGNOSIS — C50512 Malignant neoplasm of lower-outer quadrant of left female breast: Secondary | ICD-10-CM | POA: Diagnosis not present

## 2014-04-21 DIAGNOSIS — M25551 Pain in right hip: Secondary | ICD-10-CM

## 2014-04-21 DIAGNOSIS — C50912 Malignant neoplasm of unspecified site of left female breast: Secondary | ICD-10-CM

## 2014-04-21 DIAGNOSIS — C7949 Secondary malignant neoplasm of other parts of nervous system: Secondary | ICD-10-CM | POA: Insufficient documentation

## 2014-04-21 DIAGNOSIS — C50919 Malignant neoplasm of unspecified site of unspecified female breast: Secondary | ICD-10-CM

## 2014-04-21 DIAGNOSIS — C773 Secondary and unspecified malignant neoplasm of axilla and upper limb lymph nodes: Secondary | ICD-10-CM | POA: Diagnosis not present

## 2014-04-21 DIAGNOSIS — R0781 Pleurodynia: Secondary | ICD-10-CM | POA: Diagnosis not present

## 2014-04-21 LAB — CBC WITH DIFFERENTIAL/PLATELET
BASO%: 0.1 % (ref 0.0–2.0)
BASOS ABS: 0 10*3/uL (ref 0.0–0.1)
EOS ABS: 0 10*3/uL (ref 0.0–0.5)
EOS%: 0 % (ref 0.0–7.0)
HEMATOCRIT: 38.6 % (ref 34.8–46.6)
HEMOGLOBIN: 12.7 g/dL (ref 11.6–15.9)
LYMPH#: 0.5 10*3/uL — AB (ref 0.9–3.3)
LYMPH%: 4.9 % — AB (ref 14.0–49.7)
MCH: 31.3 pg (ref 25.1–34.0)
MCHC: 33 g/dL (ref 31.5–36.0)
MCV: 94.8 fL (ref 79.5–101.0)
MONO#: 0.9 10*3/uL (ref 0.1–0.9)
MONO%: 8.6 % (ref 0.0–14.0)
NEUT#: 9.5 10*3/uL — ABNORMAL HIGH (ref 1.5–6.5)
NEUT%: 86.4 % — ABNORMAL HIGH (ref 38.4–76.8)
PLATELETS: 231 10*3/uL (ref 145–400)
RBC: 4.07 10*6/uL (ref 3.70–5.45)
RDW: 15.9 % — AB (ref 11.2–14.5)
WBC: 11 10*3/uL — AB (ref 3.9–10.3)

## 2014-04-21 LAB — COMPREHENSIVE METABOLIC PANEL (CC13)
ALK PHOS: 103 U/L (ref 40–150)
ALT: 23 U/L (ref 0–55)
ANION GAP: 12 meq/L — AB (ref 3–11)
AST: 25 U/L (ref 5–34)
Albumin: 3.7 g/dL (ref 3.5–5.0)
BUN: 14.7 mg/dL (ref 7.0–26.0)
CALCIUM: 9.7 mg/dL (ref 8.4–10.4)
CO2: 24 mEq/L (ref 22–29)
Chloride: 103 mEq/L (ref 98–109)
Creatinine: 0.8 mg/dL (ref 0.6–1.1)
EGFR: 82 mL/min/{1.73_m2} — AB (ref >90)
GLUCOSE: 102 mg/dL (ref 70–140)
Potassium: 4.3 mEq/L (ref 3.5–5.1)
SODIUM: 139 meq/L (ref 136–145)
TOTAL PROTEIN: 6.6 g/dL (ref 6.4–8.3)
Total Bilirubin: 0.38 mg/dL (ref 0.20–1.20)

## 2014-04-21 MED ORDER — DENOSUMAB 120 MG/1.7ML ~~LOC~~ SOLN
120.0000 mg | Freq: Once | SUBCUTANEOUS | Status: AC
  Administered 2014-04-21: 120 mg via SUBCUTANEOUS
  Filled 2014-04-21: qty 1.7

## 2014-04-21 NOTE — Progress Notes (Signed)
ID: Brooke [name]   DOB: [birth year]  MR#: [id number]  OZH#:[id number]  PCP: Irven Shelling, MD GYN: SU:  OTHER MD: Thea Silversmith, Gaynelle Arabian, [name] [phone]), Willa Rough DDS  CHIEF COMPLAINT: stage IV breast cancer  CURRENT THERAPY: Denosumab, whole brain radiation, intrathecal DepoCyt  BREAST CANCER HISTORY: From the January 2011 summary:  Brooke [name] had screening mammography in August 04, 2007 which showed no specific mammographic evidence of malignancy, but she reported a left breast lump.  Accordingly, she was brought back on August 11, 2007 for mammography and ultrasonography.  There was indeed an obscured mass in the upper left breast which by ultrasound appeared to be a simple cyst.    Screening mammogram on August 04, 2008 showed in addition to dense breasts, again a possible mass in the left breast.  She had diagnostic mammography August 06, 2008 and additional views in addition to very dense breasts did not show any persistent mass or distortion.  Furthermore, Dr. Owens Shark was not able to palpate any abnormality in the lateral portion of the left breast.  Ultrasound showed normal appearing fibroglandular tissue in the area in question.    More recently, about September, the patient felt again something like a lump in the left breast and she had some pain associated with this.  This was initially felt simply to be mastalgia and was treated as such, but as it persisted, on December 23rd, the patient had left diagnostic mammography and ultrasonography.  There was a focally dense area in the left breast with very minimal distortion corresponding to the area of palpable abnormality. The ultrasound showed an irregular ill-defined, hypoechoic area with distal shadowing, measuring approximately 1 cm corresponding to the patient's palpable abnormality.    With this information, the patient was brought back for ultrasound-guided core biopsy and this was performed December 28th. The  final pathology ([id number]) showed an invasive ductal carcinoma which appears to be Grade 2.  There was no evidence of angiolymphatic invasion in the material submitted which measured 1.5 cm maximally.  In addition to the mass in the breast, a lymph node in the left axilla was biopsied and was likewise positive.  The tumor was ER positive at 89%, PR positive at 34% with an elevated MIB-1 at 44% and Her-2 negative with a ratio of 1.13.    Her subsequent history is as detailed below.  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 I at the last office visit here she had complained of dizziness and we had set her up for the brain MRI study. In the meantime she was evaluated by Dr. Dorian Pod who called to tell me they are ward visual field defects and evidence of increased optic nerve pressure suggesting a central nervous system problem. In retrospect Brooke [name] has had a variety of symptoms which she has attributed to the capecitabine she had been receiving for peripheral, bone only metastatic disease, but with more likely now appears to be related to this development. The MRI as not showing discrete parenchymal brain lesions but rather leptomeningeal spread with a couple of areas of accumulation particularly posteriorly which probably account for her visual changes. There is nothing at the orbits themselves.  REVIEW OF SYSTEMS: Brooke [name] is feeling considerably better since going off the capecitabine. Of course she was also started on dexamethasone as soon as we got the MRI results. She is currently on 8 mg twice  daily. She still has trouble reading, but is not having any headaches, nausea or vomiting problems, and denies dizziness. Of course she is very concerned about this development and particularly want to tell their children. A detailed review of systems today was otherwise essentially stable  PAST  MEDICAL HISTORY: Past Medical History  Diagnosis Date  . Hx Breast Cancer, IDC, Stahe III, receptor + 10/20/2010    left breast  . Breast cancer December 2010    invasive ductal and invasive lobular; bilateral mastectomy and radiation  . Mitral valve prolapse   . History of recurrent UTIs   . Radiation 07/26/09-09/08/09    left breast  . Fracture, pelvis closed 11/2012    both hipsand number 7 rib, 15 rounds radiation  . Metastasis to bone   1. Remote migraines, currently inactive.   2. History of prior diagnosis of fibromyalgia made at [facility] and currently not active (she was treated with trimethoprim and nortriptyline for one year for this).    3. History of recurrent UTIs.   4. History of benign left breast cyst aspirated in 2004 at [facility] under Dr. Melene Plan.   5. History of tonsillectomy and adenoidectomy.   6. History of right LASEK surgery. 7. History of bilateral reduction mammoplasties in 1994.   8. History of mitral valve prolapse diagnosed in 1984.  PAST SURGICAL HISTORY: Past Surgical History  Procedure Laterality Date  . Mastectomy Bilateral 2011    left breast cancer  . Lasik    . Reduction mammaplasty Bilateral 1994  . Tonsillectomy and adenoidectomy    . Aspiration of left breast Left 2004    [facility]/Dr.Teo  . Bone biopsy  02/19/13    mets from breast cancer-receptors results not back    FAMILY HISTORY Family History  Problem Relation Age of Onset  . Lung cancer Father 87    smoker for 25 years  . Heart disease Maternal Grandmother   . Heart disease Maternal Grandfather   . Diabetes type I Mother   . Diabetes type I Sister   . Diabetes type I Sister   . Heart disease Maternal Uncle   . Uterine cancer Paternal Grandmother 66  . Breast cancer Other 76    paternal great grandmother  [name] died at the age of 93 from lung cancer.  He had been a remote smoker and had been in [employer] with a  question of possible asbestos exposure.  [name] died at the age of 58 in the setting of Alzheimer's, coronary artery disease, diabetes and hypertension.  The patient had two sisters.  One died from complications of diabetes.  The other one is alive with diabetes.    GYNECOLOGIC HISTORY: She is GX P1.  First pregnancy to term at age 4.  The patient's last period was in 2008.  She took hormone replacement for about 18 months until mid-2008.  She had no complications from that treatment.    SOCIAL HISTORY: (Updated October 2014) She used to be a Arts development officer for [employer]  They used to live in the [place] area. 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  Her husband, Brooke [name], retired October 2012.  He was a Arts development officer for 28 years.  Their son, Brooke [name], is currently with the [employer] at the [employer] in [place]. [name].  She attends [employer].   ADVANCED  DIRECTIVES: in place  HEALTH MAINTENANCE: (Updated 11/19/2012) History  Substance Use Topics  . Smoking status: Former Smoker -- 1.00 packs/day for 11 years    Quit date: 01/23/1984  . Smokeless tobacco: Never Used  . Alcohol Use: No     Colonoscopy: 2010  PAP: Jan 2014  Bone density: July 2014, osteopenia  Lipid panel: per Dr Valentina Lucks  Allergies  Allergen Reactions  . Sulfa Antibiotics Shortness Of Breath  . Codeine Nausea And Vomiting    Tolerates oxycodone if given with prochlorperazine for nausea  . Morphine And Related Nausea And Vomiting    Per patient possibility that the IR morphine was causing the vomiting   Current Outpatient Prescriptions on File Prior to Visit  Medication Sig Dispense Refill  . ALPRAZolam (XANAX XR) 1 MG 24 hr tablet Take 1 tablet (1 mg total) by mouth daily. 90 tablet 0  . Calcium Citrate (CITRACAL PO) Take 3 tablets by mouth daily.    . capecitabine (XELODA) 500 MG tablet Take 1,500 mg by  mouth See admin instructions. Take twice daily after meals for 7 days then stop for 7 days    . cholecalciferol (VITAMIN D) 400 UNITS TABS tablet Take 400 Units by mouth daily. Patient reportd the Vitamin E should be vitamin D with same dose    . diphenhydrAMINE (BENADRYL) 25 mg capsule Take 25 mg by mouth at bedtime as needed for sleep (sleep).     . fentaNYL (DURAGESIC - DOSED MCG/HR) 25 MCG/HR patch Place 1 patch (25 mcg total) onto the skin every 3 (three) days. 20 patch 0  . gabapentin (NEURONTIN) 300 MG capsule TAKE ONE CAPSULE BY MOUTH AT BEDTIME 90 capsule 0  . HYDROmorphone (DILAUDID) 4 MG tablet Take 1-2 tablets (4-8 mg total) by mouth every 4 (four) hours as needed for severe pain (pain). 240 tablet 0  . ondansetron (ZOFRAN) 8 MG tablet Take 8 mg by mouth 2 (two) times daily as needed for nausea or vomiting (nausea & vomiting).    . polyethylene glycol (MIRALAX / GLYCOLAX) packet Take 17 g by mouth daily as needed for mild constipation.    . prochlorperazine (COMPAZINE) 5 MG tablet Take 1 tablet (5 mg total) by mouth every 6 (six) hours as needed for nausea or vomiting. (Patient not taking: Reported on 04/05/2014) 120 tablet 2  . venlafaxine XR (EFFEXOR-XR) 75 MG 24 hr capsule TAKE 2 CAPSULES (150MG)    DAILY WITH BREAKFAST 180 capsule 0   Current Facility-Administered Medications on File Prior to Visit  Medication Dose Route Frequency Provider Last Rate Last Dose  . LORazepam (ATIVAN) tablet 2 mg  2 mg Oral Once Chauncey Cruel, MD        OBJECTIVE: Middle-aged white woman examined in a wheelchair  Filed Vitals:   04/21/14 1300  BP: 138/83  Pulse: 83  Temp: 98.7 F (37.1 C)  Resp: 18  Body mass index is 31.53 kg/(m^2).  ECOG 2 Formal exam not repeated today  LAB RESULTS:  Lab Results  Component Value Date   WBC 11.0* 04/21/2014   NEUTROABS 9.5* 04/21/2014   HGB 12.7 04/21/2014   HCT 38.6 04/21/2014   MCV 94.8 04/21/2014   PLT 231 04/21/2014      Chemistry       Component Value Date/Time   NA 139 04/21/2014 1229   NA 141 03/29/2013 0350   NA 142 06/26/2011 0728   K 4.3 04/21/2014 1229   K 3.7 03/29/2013 0350   K  4.6 06/26/2011 0728   CL 111 03/29/2013 0350   CL 105 01/07/2012 1347   CL 102 06/26/2011 0728   CO2 24 04/21/2014 1229   CO2 18* 03/29/2013 0350   CO2 30 06/26/2011 0728   BUN 14.7 04/21/2014 1229   BUN 10 03/29/2013 0350   BUN 12 06/26/2011 0728   CREATININE 0.8 04/21/2014 1229   CREATININE 0.72 03/29/2013 0350   CREATININE 1.1 06/26/2011 0728      Component Value Date/Time   CALCIUM 9.7 04/21/2014 1229   CALCIUM 7.8* 03/29/2013 0350   CALCIUM 9.1 06/26/2011 0728   ALKPHOS 103 04/21/2014 1229   ALKPHOS 91 04/30/2013 1301   AST 25 04/21/2014 1229   AST 67* 04/30/2013 1301   ALT 23 04/21/2014 1229   ALT 93* 04/30/2013 1301   BILITOT 0.38 04/21/2014 1229   BILITOT 0.4 04/30/2013 1301      Mr Brain W Wo Contrast  04/20/2014   CLINICAL DATA:  Recurrent, persistent headaches and dizziness beginning 3 weeks ago. History of metastatic breast cancer.  EXAM: MRI HEAD WITHOUT AND WITH CONTRAST  TECHNIQUE: Multiplanar, multiecho pulse sequences of the brain and surrounding structures were obtained without and with intravenous contrast.  CONTRAST:  15 mL MultiHance  COMPARISON:  Head CT 05/18/2009  FINDINGS: Diffusely heterogeneous bone marrow signal throughout the skull and visualized upper cervical spine corresponds to known, widespread osseous metastatic disease.  There is no evidence of acute infarct, intracranial hemorrhage, midline shift, or extra-axial fluid collection. Ventricles and sulci are normal for age. Scattered, small foci of T2 hyperintensity in the subcortical and deep cerebral white matter bilaterally are nonspecific but compatible with mild chronic small vessel ischemic disease.  No definite enhancing parenchymal brain lesions are identified. There is abnormal leptomeningeal enhancement along the cerebellar folia  superiorly. There is also a small amount of leptomeningeal enhancement in the medial left occipital lobe. There is a 6 mm enhancing extra-axial mass along the left aspect of the falx superiorly (series 12, image 62), not clearly present on the prior CT. Abnormal leptomeningeal enhancement is also suspected along the ventral brainstem, along the tectum, and possibly along the upper cervical spinal cord. There is a 7 mm enhancing nodule in the pineal region.  Orbits are unremarkable. There is minimal posterior left ethmoid air cell opacification. Mastoid air cells are clear.  IMPRESSION: 1. 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 2. 6 mm extra-axial nodule along the falx, which may represent a metastatic deposit or small meningioma. 3. 7 mm enhancing pineal region nodule. This may represent a mildly prominent but normal pineal gland, although a metastasis is not excluded. 4. Diffuse osseous metastases. 5. No definite parenchymal brain metastases identified. 6. Mild chronic small vessel ischemic disease. These results will be called to the ordering clinician or representative by the Radiologist Assistant, and communication documented in the PACS or zVision Dashboard.   Electronically Signed   By: Logan Bores   On: 04/20/2014 14:36    ASSESSMENT: 64 y.o. [place] woman   (1)  status post Left breast and Left axillary lymph node biopsy Dec 2010 for a cT3 pN1 (stage IIIA) invasive ductal carcinoma, grade 2, estrogen and progesterone receptor positive, HER-2 negative, with an MIB-1 of 44%;   (2)  treated neoadjuvantly with dose dense cyclophosphamide and doxorubicin x4 followed by weekly paclitaxel x7  (3) s/p bilateral mastectomies May 2011. She had a residual 1.5 cm  tumor, and one of 8 sampled lymph nodes was positive [ypT1c ypN1]. Margins were negative.   (4)  She completed post-mastectomy  radiation in August of 2011   (5) on letrozole between August 2011 and October 2014  (6) PET scan 11/05/2012 obtained to evaluate right chest wall pain documents bony metastatic disease to the right seventh rib, L1, and the right acetabulum with an associated pathologic fracture through the anterior cortex of the acetabulum measuring 3.2 cm. There was no evidence of extra osseous metastatic disease.  (7) starting 11/05/2012 a received fulvestrant, 500 mg monthly, in addition to monthly infusions of zoledronic acid. The fulvestrant was discontinued May 2015 with evidence of progression  (8) Right rib, left hip and right hip were all treated to 37.5 Gy in 15 fractions at 2.5 Gy per fraction completed 12/17/2012  (9) 02/19/2013 bone marrow biopsy confirms metastatic disease to bone. HER-2 was not done because of decaalcification of specimen. Estrogen and progesterone receptors were read as negative. This i was felt to be possibly discordant.  (10) patient's tumor carries a PIK3 mutation as documented at [facility]  (11) L ribcage pain: s/p palliative XRT completed 06/16/2013  (12) declined participation in a capecitabine plus PIK 3 inhibitor at [facility]; started capecitabine alone 06/27/2013, discontinued after one week because of side effects;  (a) resumed 08/17/2013 at 500 mg twice a day one-week on one-week off  (b) dose being increased gradually, received 1000 mg BID with cycle starting 09/28/2013  (c) full dose of 1524m BID started 10/12/13  (d) capecitabine discontinued 04/15/2014 due to side effects  (13) radiation to Right sacrum/ lower lumbar spine 06/10 to 06/23   (14) non-displaced fracture proximal right humerus secondary to a fall November 2015  (15) 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 We discussed  the anatomy of the meningeal sac and the fact that the fluid that circulates around and through the brain also circulates all the way down the spine. If we wanted to eradicate all the cells in this fluid we would have to do craniospinal radiation, but I would be very uncomfortable doing that. Though my experience with that is limited, the patient's I have had go through that procedure have fared very poorly  By contrast I think whole brain irradiation is a good idea. It may help control some of the symptoms that she has been experiencing and possibly improve her vision as well. It should help lower the risk of strokes and seizures. If we get a reasonable response we can extend it by having an Ommaya reservoir placed and proceeding to every 2 week DepoCyt treatments. These have the potential of controlling the problem not only in the brain area but down the spine as well, since the chemotherapy should diffuse throughout the cerebral spinal fluid (sometimes there are pockets preventing this of course).  We discussed the possible toxicities, side effects and complications of intrathecal treatments. She understands this would require an Ommaya reservoir to be placed. Her case is going to be presented at the multidisciplinary brain cancer conference this coming Monday, April 4 and a definitive decision can be made regarding intrathecal therapy at that time. PFraser Dinhas an appointment with Dr. WPablo Ledgertomorrow to discuss whole brain radiation  PFraser Dinand PAbbe Amsterdamare very clear that they do not want treatment just for the sake of  extending life. If there is a reasonable chance of improving symptoms or at least maintaining her current functional status, they are interested in that. They clearly do not want resuscitation in case of a terminal event.   They wanted a "hard" prognosis, but I really am not able to give them that. I think it would be reasonable to say that with the treatment plan we have proposed a 3-12 month  prognosis would be reasonable. Those numbers of course are not "hard", but merely an indication they can use to discuss the overall situation with her children, which they are planning to do this weekend.  Brooke [name] already as an appointment with me for April 11, but she knows how to call me and of course if she is getting radiation here daily she can simply come up to my office for a visit as needed. I also offered to meet with her children if that would be helpful to them out when they come to town.  .04/21/2014   Chauncey Cruel, MD Medical Oncology and Hematology [facility] [address] [place], [place] [address] Tel. [phone]    Fax. [phone]

## 2014-04-22 ENCOUNTER — Ambulatory Visit
Admission: RE | Admit: 2014-04-22 | Discharge: 2014-04-22 | Disposition: A | Discharge status: Home / Self Care | Payer: Medicare Other | Source: Ambulatory Visit | Attending: Radiation Oncology | Admitting: Radiation Oncology

## 2014-04-22 VITALS — BP 142/74 | HR 87 | Temp 98.0°F | Wt 183.1 lb

## 2014-04-22 DIAGNOSIS — C7931 Secondary malignant neoplasm of brain: Secondary | ICD-10-CM

## 2014-04-22 DIAGNOSIS — Z853 Personal history of malignant neoplasm of breast: Secondary | ICD-10-CM | POA: Insufficient documentation

## 2014-04-22 DIAGNOSIS — C50919 Malignant neoplasm of unspecified site of unspecified female breast: Secondary | ICD-10-CM | POA: Diagnosis present

## 2014-04-22 DIAGNOSIS — C7951 Secondary malignant neoplasm of bone: Principal | ICD-10-CM

## 2014-04-22 NOTE — Progress Notes (Signed)
Department of Radiation Oncology  Phone:  [phone] Fax:        [phone]   Name: KEMONIE CUTILLO MRN: [id number]  DOB: August 28, 1950  Date: 04/22/2014  Follow Up Visit Note  Diagnosis: Metastatic (Stage IV) breast cancer   Summary and Interval since last radiation: 60.4 Gy to the left chest wall completed 08/2009. 37.5 gy in 15 fractions to the bilateral hips and right ribs completed 12/17/12. RT to the left ribs (retreat over prior chest wall tx) completed 06/22/13. 30 Gy in 10 fractions to Right ribs (retreat), T9-L1 and left femur completed 10/30/13  Interval History: Madelline came into clinic today to discuss her newly diagnosed leptomeningeal disease. She has been struggling with dizziness, occiptal headaches, nausea and visual issues for the past few weeks. An MRI of the brain showed leptomeningeal disease along the cerebellum, left occiptal lobe, brainstem and cervical spinal cord. Nodules were noted along the falx and in the pineal glad with diffuse disease in the skull. She was started on decadron and feels maybe a little bit better on that.  She does complain of neck pain and stiffness. She is overall weak and her lower back pain has been getting better with the Xeloda. This is stable for now on narcotics.  She is obviously upset and she and [name] have been talking about how to plan for the future.  She emphasizes quality of life over quantity of life.  They requested a meeting with hospice or palliative care just so they can plan things better. They requested that here at the hospital if possible.   Allergies:  Allergies  Allergen Reactions  . Sulfa Antibiotics Shortness Of Breath  . Codeine Nausea And Vomiting    Tolerates oxycodone if given with prochlorperazine for nausea  . Morphine And Related Nausea And Vomiting    Per patient possibility that the IR morphine was causing the vomiting    Medications:  Current Outpatient Prescriptions  Medication Sig Dispense Refill   . ALPRAZolam (XANAX XR) 1 MG 24 hr tablet Take 1 tablet (1 mg total) by mouth daily. 90 tablet 0  . Calcium Citrate (CITRACAL PO) Take 3 tablets by mouth daily.    . cholecalciferol (VITAMIN D) 400 UNITS TABS tablet Take 400 Units by mouth daily. Patient reportd the Vitamin E should be vitamin D with same dose    . dexamethasone (DECADRON) 4 MG tablet Take 4 mg by mouth 2 (two) times daily. 2 tablets twice a day with food    . diphenhydrAMINE (BENADRYL) 25 mg capsule Take 25 mg by mouth at bedtime as needed for sleep (sleep).     . fentaNYL (DURAGESIC - DOSED MCG/HR) 25 MCG/HR patch Place 1 patch (25 mcg total) onto the skin every 3 (three) days. 20 patch 0  . gabapentin (NEURONTIN) 300 MG capsule TAKE ONE CAPSULE BY MOUTH AT BEDTIME 90 capsule 0  . HYDROmorphone (DILAUDID) 4 MG tablet Take 1-2 tablets (4-8 mg total) by mouth every 4 (four) hours as needed for severe pain (pain). 240 tablet 0  . ondansetron (ZOFRAN) 8 MG tablet Take 8 mg by mouth 2 (two) times daily as needed for nausea or vomiting (nausea & vomiting).    . polyethylene glycol (MIRALAX / GLYCOLAX) packet Take 17 g by mouth daily as needed for mild constipation.    . prochlorperazine (COMPAZINE) 5 MG tablet Take 1 tablet (5 mg total) by mouth every 6 (six) hours as needed for nausea or vomiting. 120 tablet 2  .  venlafaxine XR (EFFEXOR-XR) 75 MG 24 hr capsule TAKE 2 CAPSULES (150MG )    DAILY WITH BREAKFAST 180 capsule 0  . capecitabine (XELODA) 500 MG tablet Take 1,500 mg by mouth See admin instructions. Take twice daily after meals for 7 days then stop for 7 days     No current facility-administered medications for this encounter.   Facility-Administered Medications Ordered in Other Encounters  Medication Dose Route Frequency Provider Last Rate Last Dose  . LORazepam (ATIVAN) tablet 2 mg  2 mg Oral Once Chauncey Cruel, MD        Physical Exam:  Filed Vitals:   04/22/14 1202  BP: 142/74  Pulse: 87  Temp: 98 F (36.7 C)   Weight: 183 lb 1.6 oz (83.054 kg)  SpO2: 99%  Pleasant female. Alert and oriented. Appropriate. CN 2-12 are intact. She has 4/5 strength in her bilateral upper and lower extremities.   IMPRESSION: Brooke [name] is a 64 y.o. female with new leptomeningeal disease.  PLAN:  We discussed the poor prognosis usually associated with leptomingeal disease. We discussed the mechanism of CSF flow in and around the brain and spinal axis.  She cannot tolerate a spinal MRI right now. She would again like to only undergo treatment if it can improve or maintain her quality of life. We discussed that she may have headache and may neurologically decline due to irritation of the meninges by radiation. We discussed her vision may worsen as could her balance.  We agreed that if any of this happened, we would stop treatment right away.   I have arranged for a palliative care consult at their request with Faylene Million here at the cancer center on Monday. We discussed this did not mean she would be enrolling in hospice, simply that this conversation would allow her and [name] to gain information about end of life care.   She will be presented and discussed at our brain tumor board on Monday and the discussion of Omaya with intrathecal chemotherapy will be shared with her and [name].   She signed informed consent and we were able to schedule her for later today.   Thea Silversmith, MD

## 2014-04-22 NOTE — Progress Notes (Signed)
Please see the Nurse Progress Note in the MD Initial Consult Encounter for this patient. 

## 2014-04-22 NOTE — Progress Notes (Signed)
Re consultation of metastatic invasive ductal and lobular carcinoma of left breast  To brain mets.patient has had onset of headache over a month but states she did feel some pins/needle sensation at back of head a few months ago, increased dizziness, unsteady gait, blurred vision secondary to pressure on optic nerve and  Intermittent nausea and vomiting weekly or bi-weekly.Has a wheel chair.Pain controlled on dilaudid and duragesic.Started dexamethasone 8 mg twice daily with meals Monday pm 04/19/14.Patient already had dexamethasone on hand at home.

## 2014-04-22 NOTE — Progress Notes (Signed)
[facility]  Radiation Oncology   Simulation and Treatment Planning Note   Name: Brooke [name]    MRN: [id number]  Date: 04/22/2014    DOB: 1950/08/29  Status:  DIAGNOSIS: Brain Metastases  CONSENT VERIFIED:yes   SET UP: Patient is setup supine   IMMOBILIZATION: Following immobilization is used:Open face mask and modable pillow.  NARRATIVE:The patient was brought to the [facility]. Identity was confirmed. All relevant records and images related to the planned course of therapy were reviewed. Then, the patient was positioned in a stable reproducible clinical set-up for radiation therapy. CT images were obtained. Marks were placed on the mask. The CT images were loaded into the planning software where the target (brain) and avoidance structures (globes) were contoured. The radiation prescription was entered and confirmed.   TREATMENT PLANNING NOTE:  Treatment planning then occurred.  I have requested : MLC's, isodose plan, basic dose calculation  A total of 4 complex treatment devices were created.

## 2014-04-23 ENCOUNTER — Ambulatory Visit: Payer: Medicare Other | Admitting: Radiation Oncology

## 2014-04-23 DIAGNOSIS — C50919 Malignant neoplasm of unspecified site of unspecified female breast: Secondary | ICD-10-CM | POA: Diagnosis not present

## 2014-04-25 ENCOUNTER — Other Ambulatory Visit: Payer: Medicare Other

## 2014-04-26 ENCOUNTER — Ambulatory Visit: Payer: Medicare Other

## 2014-04-26 ENCOUNTER — Ambulatory Visit
Admission: RE | Admit: 2014-04-26 | Discharge: 2014-04-26 | Disposition: A | Discharge status: Home / Self Care | Payer: Medicare Other | Source: Ambulatory Visit | Attending: Radiation Oncology | Admitting: Radiation Oncology

## 2014-04-26 ENCOUNTER — Other Ambulatory Visit: Payer: Self-pay | Admitting: Oncology

## 2014-04-26 DIAGNOSIS — Z66 Do not resuscitate: Secondary | ICD-10-CM

## 2014-04-26 DIAGNOSIS — Z515 Encounter for palliative care: Secondary | ICD-10-CM | POA: Diagnosis not present

## 2014-04-26 DIAGNOSIS — G039 Meningitis, unspecified: Secondary | ICD-10-CM | POA: Diagnosis not present

## 2014-04-26 NOTE — Consult Note (Signed)
Patient Brooke [name]      DOB: 18-Oct-1950      OJJ:[id number]     Consult Note from the Palliative Medicine Team at [facility] Requested by: Dr Pablo Ledger     PCP: Irven Shelling, MD Reason for Consultation:Clarification of [place] and options     Phone Number:[phone]  Assessment of patients Current state:  Patient and her husband face the physical, emotional and cognitive changes associated with terminal cancer diagnosis while searching for meaning in each day,  within their relationship and family.  Consult is for review of medical treatment options, clarification of goals of care and end of life issues and options, and symptom recommendations as indicated  This NP Wadie Lessen reviewed medical records, received report from team,  then meet with the patient and her husband in OP radiation-oncology clinic to discuss diagnosis, prognosis, GOC, EOL wishes disposition and options.  A detailed discussion was had today regarding advanced directives.  Concepts specific to code status, artifical feeding and hydration, continued IV antibiotics and rehospitalization was had.  The difference between a aggressive medical intervention path  and a palliative comfort care path for this patient at this time was had.  Values and goals of care important to patient and family were attempted to be elicited.  Concept of Hospice and Palliative Care were discussed.  Will discuss hospice referral with Dr Jana Hakim  Natural trajectory and expectations at EOL were discussed.  Questions and concerns addressed.   Family encouraged to call with questions or concerns.  PMT will continue to support holistically.   Goals of Care: 1.  Code Status:  DNR/DNI   2. Scope of Treatment:  Today patient and her husband are open to "trying" whole brain radiation in hopes of  decreasing symptom burden and prolonging quality life.  There is definite hesitation with this decision.   3. Symptom  Management:   Fatigue:-Pace yourself -Plan your day -Include naps and breaks -schedule a relaxing day -get a little exercise -fuel the body -consider complementary therapies  -deep breathing -prayer/medication -guided meditation  2.    Pain:   HA/generalized  pain  currently controlled with Fentanyl patch 25 mcg with Advil prn 3.    Bowel Regimen:  Discussed constipation side effect of oral opioids and symptom management.   4. Psychosocial: Emotional support offered to patient and her husband.  Encouraged and educated on hospice resources for counseling and bereavement.     5. 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  Family faced with limited treatment options and decisions.  ROS:  HA, dizziness, blurred vision, fatigue   PMH:  Past Medical History  Diagnosis Date  . Hx Breast Cancer, IDC, Stahe III, receptor + 10/20/2010    left breast  . Breast cancer December 2010    invasive ductal and invasive lobular; bilateral mastectomy and radiation  . Mitral valve prolapse   . History of recurrent UTIs   . Radiation 07/26/09-09/08/09    left breast  . Fracture,  pelvis closed 11/2012    both hipsand number 7 rib, 15 rounds radiation  . Metastasis to bone      PSH: Past Surgical History  Procedure Laterality Date  . Mastectomy Bilateral 2011    left breast cancer  . Lasik    . Reduction mammaplasty Bilateral 1994  . Tonsillectomy and adenoidectomy    . Aspiration of left breast Left 2004    [facility]/Dr.Teo  . Bone biopsy  02/19/13     mets from breast cancer-receptors results not back   I have reviewed the [place] and SH and  If appropriate update it with new information. Allergies  Allergen Reactions  . Sulfa Antibiotics Shortness Of Breath  . Codeine Nausea And Vomiting    Tolerates oxycodone if given with prochlorperazine for nausea  . Morphine And Related Nausea And Vomiting    Per patient possibility that the IR morphine was causing the vomiting   Scheduled Meds: Continuous Infusions: PRN Meds:.    LMP 01/23/2007   PPS: 40 % at best  No intake or output data in the 24 hours ending 04/26/14 1005   Physical Exam:  General: NAD, well nourished HEENT:  Moist buccal membranes Ext: no edema Neuro: alert and oriented X3  Labs: CBC    Component Value Date/Time   WBC 11.0* 04/21/2014 1229   WBC 7.0 03/29/2013 0350   RBC 4.07 04/21/2014 1229   RBC 3.29* 03/29/2013 0350   HGB 12.7 04/21/2014 1229   HGB 9.7* 03/29/2013 0350   HCT 38.6 04/21/2014 1229   HCT 29.0* 03/29/2013 0350   PLT 231 04/21/2014 1229   PLT 165 03/29/2013 0350   MCV 94.8 04/21/2014 1229   MCV 88.1 03/29/2013 0350   MCH 31.3 04/21/2014 1229   MCH 29.5 03/29/2013 0350   MCHC 33.0 04/21/2014 1229   MCHC 33.4 03/29/2013 0350   RDW 15.9* 04/21/2014 1229   RDW 12.4 03/29/2013 0350   LYMPHSABS 0.5* 04/21/2014 1229   LYMPHSABS 0.9 03/29/2013 0350   MONOABS 0.9 04/21/2014 1229   MONOABS 0.3 03/29/2013 0350   EOSABS 0.0 04/21/2014 1229   EOSABS 0.0 03/29/2013 0350   BASOSABS 0.0 04/21/2014 1229   BASOSABS 0.0 03/29/2013 0350    BMET    Component Value Date/Time   NA 139 04/21/2014 1229   NA 141 03/29/2013 0350   NA 142 06/26/2011 0728   K 4.3 04/21/2014 1229   K 3.7 03/29/2013 0350   K 4.6 06/26/2011 0728   CL 111 03/29/2013 0350   CL 105 01/07/2012 1347   CL 102 06/26/2011 0728   CO2 24 04/21/2014 1229   CO2 18* 03/29/2013 0350   CO2 30 06/26/2011 0728   GLUCOSE 102 04/21/2014 1229   GLUCOSE 136* 03/29/2013 0350    GLUCOSE 89 01/07/2012 1347   GLUCOSE 108 06/26/2011 0728   BUN 14.7 04/21/2014 1229   BUN 10 03/29/2013 0350   BUN 12 06/26/2011 0728   CREATININE 0.8 04/21/2014 1229   CREATININE 0.72 03/29/2013 0350   CREATININE 1.1 06/26/2011 0728   CALCIUM 9.7 04/21/2014 1229   CALCIUM 7.8* 03/29/2013 0350   CALCIUM 9.1 06/26/2011 0728   GFRNONAA >90 03/29/2013 0350   GFRAA >90 03/29/2013 0350    CMP     Component Value Date/Time   NA 139 04/21/2014 1229   NA 141 03/29/2013 0350   NA 142 06/26/2011 0728   K 4.3 04/21/2014 1229   K 3.7 03/29/2013 0350   K 4.6 06/26/2011 0728  CL 111 03/29/2013 0350   CL 105 01/07/2012 1347   CL 102 06/26/2011 0728   CO2 24 04/21/2014 1229   CO2 18* 03/29/2013 0350   CO2 30 06/26/2011 0728   GLUCOSE 102 04/21/2014 1229   GLUCOSE 136* 03/29/2013 0350   GLUCOSE 89 01/07/2012 1347   GLUCOSE 108 06/26/2011 0728   BUN 14.7 04/21/2014 1229   BUN 10 03/29/2013 0350   BUN 12 06/26/2011 0728   CREATININE 0.8 04/21/2014 1229   CREATININE 0.72 03/29/2013 0350   CREATININE 1.1 06/26/2011 0728   CALCIUM 9.7 04/21/2014 1229   CALCIUM 7.8* 03/29/2013 0350   CALCIUM 9.1 06/26/2011 0728   PROT 6.6 04/21/2014 1229   PROT 6.4 04/30/2013 1301   ALBUMIN 3.7 04/21/2014 1229   ALBUMIN 3.8 04/30/2013 1301   AST 25 04/21/2014 1229   AST 67* 04/30/2013 1301   ALT 23 04/21/2014 1229   ALT 93* 04/30/2013 1301   ALKPHOS 103 04/21/2014 1229   ALKPHOS 91 04/30/2013 1301   BILITOT 0.38 04/21/2014 1229   BILITOT 0.4 04/30/2013 1301   GFRNONAA >90 03/29/2013 0350   GFRAA >90 03/29/2013 0350     Time In Time Out Total Time Spent with Patient Total Overall Time  1100 1220 75 min 80 min    Greater than 50%  of this time was spent counseling and coordinating care related to the above assessment and plan.   Wadie Lessen NP  Palliative Medicine Team Team Phone # [phone] Pager [phone]  Discussed with Dr Pablo Ledger

## 2014-04-26 NOTE — Progress Notes (Signed)
Spoke with patient to inform her that radiation will start tomorrow as previously discussed with Dr.Wentworth.No intrathecal chemotherapy for now.Prescription refill for dexamethasone called into [facility].Paperwork per palliative care nurse Nyra Market RNP placed in front office basket to be scanned in.

## 2014-04-27 ENCOUNTER — Ambulatory Visit: Payer: Medicare Other

## 2014-04-27 ENCOUNTER — Ambulatory Visit
Admission: RE | Admit: 2014-04-27 | Discharge: 2014-04-27 | Disposition: A | Discharge status: Home / Self Care | Payer: Medicare Other | Source: Ambulatory Visit | Attending: Radiation Oncology | Admitting: Radiation Oncology

## 2014-04-27 ENCOUNTER — Telehealth: Payer: Self-pay | Admitting: *Deleted

## 2014-04-27 DIAGNOSIS — C50512 Malignant neoplasm of lower-outer quadrant of left female breast: Secondary | ICD-10-CM

## 2014-04-27 NOTE — Progress Notes (Signed)
I spoke with Brooke [name] and Brooke [name] today. She has elected not to pursue radiation.  She feels like losing her hair will "turn me into a monster" and she doesn't want her children to see that. She feels tremendous relief in making that decision. She is not sure if she wants to start hospice or restart the [facility]. She is going to talk to Dr. Jana Hakim today about that. She appreciated her meeting with Stanton Kidney on Monday.

## 2014-04-27 NOTE — Telephone Encounter (Signed)
Patient's spouse Brooke [name] called and is cancelling the radiation treatment but wants to speak with MD at 0930,wife is having misgivings about treatment at all, ,will let MD know and called [id number] and gave them update 7:59 AM

## 2014-04-28 ENCOUNTER — Ambulatory Visit: Payer: Medicare Other

## 2014-04-28 DIAGNOSIS — G039 Meningitis, unspecified: Secondary | ICD-10-CM

## 2014-04-28 DIAGNOSIS — Z66 Do not resuscitate: Secondary | ICD-10-CM | POA: Insufficient documentation

## 2014-04-28 DIAGNOSIS — G96198 Other disorders of meninges, not elsewhere classified: Secondary | ICD-10-CM | POA: Insufficient documentation

## 2014-04-28 DIAGNOSIS — Z515 Encounter for palliative care: Secondary | ICD-10-CM | POA: Insufficient documentation

## 2014-04-29 ENCOUNTER — Ambulatory Visit: Payer: Medicare Other

## 2014-04-30 ENCOUNTER — Ambulatory Visit: Payer: Medicare Other

## 2014-05-03 ENCOUNTER — Ambulatory Visit: Payer: Medicare Other

## 2014-05-03 ENCOUNTER — Other Ambulatory Visit (HOSPITAL_BASED_OUTPATIENT_CLINIC_OR_DEPARTMENT_OTHER): Payer: Medicare Other

## 2014-05-03 ENCOUNTER — Ambulatory Visit: Payer: Medicare Other | Admitting: Nurse Practitioner

## 2014-05-03 ENCOUNTER — Telehealth: Payer: Self-pay | Admitting: Oncology

## 2014-05-03 ENCOUNTER — Other Ambulatory Visit: Payer: Medicare Other

## 2014-05-03 ENCOUNTER — Ambulatory Visit (HOSPITAL_BASED_OUTPATIENT_CLINIC_OR_DEPARTMENT_OTHER): Payer: Medicare Other | Admitting: Oncology

## 2014-05-03 VITALS — BP 140/85 | HR 102 | Temp 98.1°F | Resp 18

## 2014-05-03 DIAGNOSIS — F40240 Claustrophobia: Secondary | ICD-10-CM

## 2014-05-03 DIAGNOSIS — C50919 Malignant neoplasm of unspecified site of unspecified female breast: Secondary | ICD-10-CM

## 2014-05-03 DIAGNOSIS — R0781 Pleurodynia: Secondary | ICD-10-CM | POA: Diagnosis not present

## 2014-05-03 DIAGNOSIS — C7949 Secondary malignant neoplasm of other parts of nervous system: Secondary | ICD-10-CM

## 2014-05-03 DIAGNOSIS — C50512 Malignant neoplasm of lower-outer quadrant of left female breast: Secondary | ICD-10-CM | POA: Diagnosis not present

## 2014-05-03 DIAGNOSIS — C7951 Secondary malignant neoplasm of bone: Secondary | ICD-10-CM | POA: Diagnosis not present

## 2014-05-03 DIAGNOSIS — M79603 Pain in arm, unspecified: Secondary | ICD-10-CM | POA: Diagnosis not present

## 2014-05-03 DIAGNOSIS — M25551 Pain in right hip: Secondary | ICD-10-CM

## 2014-05-03 LAB — COMPREHENSIVE METABOLIC PANEL (CC13)
ALBUMIN: 3.2 g/dL — AB (ref 3.5–5.0)
ALK PHOS: 84 U/L (ref 40–150)
ALT: 32 U/L (ref 0–55)
AST: 21 U/L (ref 5–34)
Anion Gap: 10 mEq/L (ref 3–11)
BILIRUBIN TOTAL: 0.58 mg/dL (ref 0.20–1.20)
BUN: 14 mg/dL (ref 7.0–26.0)
CO2: 22 mEq/L (ref 22–29)
Calcium: 8.8 mg/dL (ref 8.4–10.4)
Chloride: 104 mEq/L (ref 98–109)
Creatinine: 0.8 mg/dL (ref 0.6–1.1)
EGFR: 75 mL/min/{1.73_m2} — AB (ref >90)
Glucose: 295 mg/dl — ABNORMAL HIGH (ref 70–140)
POTASSIUM: 4.4 meq/L (ref 3.5–5.1)
Sodium: 136 mEq/L (ref 136–145)
Total Protein: 6 g/dL — ABNORMAL LOW (ref 6.4–8.3)

## 2014-05-03 LAB — CBC WITH DIFFERENTIAL/PLATELET
BASO%: 0.1 % (ref 0.0–2.0)
Basophils Absolute: 0 10*3/uL (ref 0.0–0.1)
EOS%: 0 % (ref 0.0–7.0)
Eosinophils Absolute: 0 10*3/uL (ref 0.0–0.5)
HCT: 43.4 % (ref 34.8–46.6)
HGB: 14.6 g/dL (ref 11.6–15.9)
LYMPH%: 3.1 % — ABNORMAL LOW (ref 14.0–49.7)
MCH: 31.6 pg (ref 25.1–34.0)
MCHC: 33.6 g/dL (ref 31.5–36.0)
MCV: 93.9 fL (ref 79.5–101.0)
MONO#: 0.9 10*3/uL (ref 0.1–0.9)
MONO%: 5.5 % (ref 0.0–14.0)
NEUT#: 15 10*3/uL — ABNORMAL HIGH (ref 1.5–6.5)
NEUT%: 91.3 % — ABNORMAL HIGH (ref 38.4–76.8)
Platelets: 200 10*3/uL (ref 145–400)
RBC: 4.62 10*6/uL (ref 3.70–5.45)
RDW: 13.9 % (ref 11.2–14.5)
WBC: 16.4 10*3/uL — ABNORMAL HIGH (ref 3.9–10.3)
lymph#: 0.5 10*3/uL — ABNORMAL LOW (ref 0.9–3.3)

## 2014-05-03 MED ORDER — LORAZEPAM 0.5 MG PO TABS: ORAL_TABLET | ORAL | Status: AC

## 2014-05-03 MED ORDER — LORAZEPAM 1 MG PO TABS: 0.5000 mg | ORAL_TABLET | Freq: Three times a day (TID) | ORAL | Status: DC | PRN

## 2014-05-03 NOTE — Telephone Encounter (Signed)
per GM to CX appts-pt aware of appts CX-PT has MY CHART per [facility]

## 2014-05-03 NOTE — Progress Notes (Signed)
ID: [name]   DOB: Apr 17, 1950  MR#: [id number]  KDT#:[id number]  PCP: Irven Shelling, MD GYN: SU:  OTHER MD: Thea Silversmith, Gaynelle Arabian, [name] [phone]), Willa Rough DDS  CHIEF COMPLAINT: stage IV breast cancer  CURRENT THERAPY: palliative care under Hospice  BREAST CANCER HISTORY: From the January 2011 summary:  Fina had screening mammography in August 04, 2007 which showed no specific mammographic evidence of malignancy, but she reported a left breast lump.  Accordingly, she was brought back on August 11, 2007 for mammography and ultrasonography.  There was indeed an obscured mass in the upper left breast which by ultrasound appeared to be a simple cyst.    Screening mammogram on August 04, 2008 showed in addition to dense breasts, again a possible mass in the left breast.  She had diagnostic mammography August 06, 2008 and additional views in addition to very dense breasts did not show any persistent mass or distortion.  Furthermore, Dr. Owens Shark was not able to palpate any abnormality in the lateral portion of the left breast.  Ultrasound showed normal appearing fibroglandular tissue in the area in question.    More recently, about September, the patient felt again something like a lump in the left breast and she had some pain associated with this.  This was initially felt simply to be mastalgia and was treated as such, but as it persisted, on December 23rd, the patient had left diagnostic mammography and ultrasonography.  There was a focally dense area in the left breast with very minimal distortion corresponding to the area of palpable abnormality. The ultrasound showed an irregular ill-defined, hypoechoic area with distal shadowing, measuring approximately 1 cm corresponding to the patient's palpable abnormality.    With this information, the patient was brought back for ultrasound-guided core biopsy and this was performed December 28th. 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  There was no evidence of angiolymphatic invasion in the material submitted which measured 1.5 cm maximally.  In addition to the mass in the breast, a lymph node in the left axilla was biopsied and was likewise positive.  The tumor was ER positive at 89%, PR positive at 34% with an elevated MIB-1 at 44% and Her-2 negative with a ratio of 1.13.    Her subsequent history is as detailed below.  INTERVAL HISTORY: [name] for follow-up of her metastatic breast cancer accompanied by her husband [name] and her son Catalina Antigua. This will last visit here she made a definitive decision that she did not desire palliative radiation to the brain. She understood that this would lengthen her life but also possibly cause other problems. She accepted the fact that she is going to be dying relatively soon, and has gone under hospice's care at home. Her son Catalina Antigua is visiting this week. Her daughter will be visiting after another 2-3 weeks  REVIEW OF SYSTEMS: [name] still feels very much at peace with her decision not to receive treatment. She is having some new left visual problems. She will be using an eye patch over that. The left ear has become very sensitive to noise. It is normal as far as simple conversation hearing his concern. Sometimes her scalp especially in the right feels like pins and needles and she feels like "the cancerous flushing around and there". She does have pain behind the left eye, but this is well-controlled on her current medications. She is not constipated from the narcotics. She  is now high fall risk and is pretty much on a wheelchair all the time all less her husband [name] is actually halfway carrying her. Sometimes she wants to get away from things and wonders if she could take something more acute then I'll pray Zulema, namely lorazepam. It detailed review of systems today was otherwise stable  PAST MEDICAL  HISTORY: Past Medical History  Diagnosis Date  . Hx Breast Cancer, IDC, Stahe III, receptor + 10/20/2010    left breast  . Breast cancer December 2010    invasive ductal and invasive lobular; bilateral mastectomy and radiation  . Mitral valve prolapse   . History of recurrent UTIs   . Radiation 07/26/09-09/08/09    left breast  . Fracture, pelvis closed 11/2012    both hipsand number 7 rib, 15 rounds radiation  . Metastasis to bone   1. Remote migraines, currently inactive.   2. History of prior diagnosis of fibromyalgia made at [facility] and currently not active (she was treated with trimethoprim and nortriptyline for one year for this).    3. History of recurrent UTIs.   4. History of benign left breast cyst aspirated in 2004 at [facility] under Dr. Melene Plan.   5. History of tonsillectomy and adenoidectomy.   6. History of right LASEK surgery. 7. History of bilateral reduction mammoplasties in 1994.   8. History of mitral valve prolapse diagnosed in 1984.  PAST SURGICAL HISTORY: Past Surgical History  Procedure Laterality Date  . Mastectomy Bilateral 2011    left breast cancer  . Lasik    . Reduction mammaplasty Bilateral 1994  . Tonsillectomy and adenoidectomy    . Aspiration of left breast Left 2004    [facility]/Dr.Teo  . Bone biopsy  02/19/13    mets from breast cancer-receptors results not back    FAMILY HISTORY Family History  Problem Relation Age of Onset  . Lung cancer Father 86    smoker for 25 years  . Heart disease Maternal Grandmother   . Heart disease Maternal Grandfather   . Diabetes type I Mother   . Diabetes type I Sister   . Diabetes type I Sister   . Heart disease Maternal Uncle   . Uterine cancer Paternal Grandmother 3  . Breast cancer Other 8    paternal great grandmother  [name] died at the age of 33 from lung cancer.  He had been a remote smoker and had been in [employer] with a  question of possible asbestos exposure.  [name] died at the age of 32 in the setting of Alzheimer's, coronary artery disease, diabetes and hypertension.  The patient had two sisters.  One died from complications of diabetes.  The other one is alive with diabetes.    GYNECOLOGIC HISTORY: She is GX P1.  First pregnancy to term at age 35.  The patient's last period was in 2008.  She took hormone replacement for about 18 months until mid-2008.  She had no complications from that treatment.    SOCIAL HISTORY: (Updated October 2014) She used to be a Arts development officer for [employer]  They used to live in the [place] area. 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  Her husband, [name], retired October 2012.  He was a Arts development officer for 28 years.  Their son, [name], is currently with the [employer] at the [employer] in [place]. [name].  She attends [employer].   ADVANCED DIRECTIVES: in place  HEALTH MAINTENANCE: (Updated 11/19/2012) History  Substance Use Topics  . Smoking status: Former Smoker -- 1.00 packs/day for 11 years    Quit date: 01/23/1984  . Smokeless tobacco: Never Used  . Alcohol Use: No     Colonoscopy: 2010  PAP: Jan 2014  Bone density: July 2014, osteopenia  Lipid panel: per Dr Valentina Lucks  Allergies  Allergen Reactions  . Sulfa Antibiotics Shortness Of Breath  . Codeine Nausea And Vomiting    Tolerates oxycodone if given with prochlorperazine for nausea  . Morphine And Related Nausea And Vomiting    Per patient possibility that the IR morphine was causing the vomiting   Current Outpatient Prescriptions on File Prior to Visit  Medication Sig Dispense Refill  . ALPRAZolam (XANAX XR) 1 MG 24 hr tablet Take 1 tablet (1 mg total) by mouth daily. 90 tablet 0  . Calcium Citrate (CITRACAL PO) Take 3 tablets by mouth daily.    . capecitabine (XELODA) 500 MG tablet Take 1,500 mg by  mouth See admin instructions. Take twice daily after meals for 7 days then stop for 7 days    . cholecalciferol (VITAMIN D) 400 UNITS TABS tablet Take 400 Units by mouth daily. Patient reportd the Vitamin E should be vitamin D with same dose    . dexamethasone (DECADRON) 4 MG tablet Take 4 mg by mouth 2 (two) times daily. 2 tablets twice a day with food 120 tablets with 1 refill called into [facility]    . diphenhydrAMINE (BENADRYL) 25 mg capsule Take 25 mg by mouth at bedtime as needed for sleep (sleep).     . fentaNYL (DURAGESIC - DOSED MCG/HR) 25 MCG/HR patch Place 1 patch (25 mcg total) onto the skin every 3 (three) days. 20 patch 0  . gabapentin (NEURONTIN) 300 MG capsule TAKE ONE CAPSULE BY MOUTH AT BEDTIME 90 capsule 0  . HYDROmorphone (DILAUDID) 4 MG tablet Take 1-2 tablets (4-8 mg total) by mouth every 4 (four) hours as needed for severe pain (pain). 240 tablet 0  . ondansetron (ZOFRAN) 8 MG tablet Take 8 mg by mouth 2 (two) times daily as needed for nausea or vomiting (nausea & vomiting).    . polyethylene glycol (MIRALAX / GLYCOLAX) packet Take 17 g by mouth daily as needed for mild constipation.    . prochlorperazine (COMPAZINE) 5 MG tablet Take 1 tablet (5 mg total) by mouth every 6 (six) hours as needed for nausea or vomiting. 120 tablet 2  . venlafaxine XR (EFFEXOR-XR) 75 MG 24 hr capsule TAKE 2 CAPSULES (150MG)    DAILY WITH BREAKFAST 180 capsule 0   Current Facility-Administered Medications on File Prior to Visit  Medication Dose Route Frequency Provider Last Rate Last Dose  . LORazepam (ATIVAN) tablet 2 mg  2 mg Oral Once Chauncey Cruel, MD        OBJECTIVE: Middle-aged white woman examined in a wheelchair  Filed Vitals:   05/03/14 1404  BP: 140/85  Pulse: 102  Temp: 98.1 F (36.7 C)  Resp: 18  Body mass index is 0.00 kg/(m^2).  ECOG 2 Sclerae unicteric, pupils equal and reactive, EOMs are intact Oropharynx clear, teeth in good repair No  cervical or supraclavicular adenopathy Lungs no rales or rhonchi Heart regular rate and rhythm Abd soft, nontender, positive bowel sounds MSK no focal spinal tenderness, no upper extremity lymphedema Neuro: nonfocal, well oriented, appropriate affect  Breasts: Deferred  LAB RESULTS:  Lab Results  Component Value Date   WBC 16.4* 05/03/2014   NEUTROABS 15.0* 05/03/2014   HGB 14.6 05/03/2014   HCT 43.4 05/03/2014   MCV 93.9 05/03/2014   PLT 200 05/03/2014      Chemistry      Component Value Date/Time   NA 136 05/03/2014 1313   NA 141 03/29/2013 0350   NA 142 06/26/2011 0728   K 4.4 05/03/2014 1313   K 3.7 03/29/2013 0350   K 4.6 06/26/2011 0728   CL 111 03/29/2013 0350   CL 105 01/07/2012 1347   CL 102 06/26/2011 0728   CO2 22 05/03/2014 1313   CO2 18* 03/29/2013 0350   CO2 30 06/26/2011 0728   BUN 14.0 05/03/2014 1313   BUN 10 03/29/2013 0350   BUN 12 06/26/2011 0728   CREATININE 0.8 05/03/2014 1313   CREATININE 0.72 03/29/2013 0350   CREATININE 1.1 06/26/2011 0728      Component Value Date/Time   CALCIUM 8.8 05/03/2014 1313   CALCIUM 7.8* 03/29/2013 0350   CALCIUM 9.1 06/26/2011 0728   ALKPHOS 84 05/03/2014 1313   ALKPHOS 91 04/30/2013 1301   AST 21 05/03/2014 1313   AST 67* 04/30/2013 1301   ALT 32 05/03/2014 1313   ALT 93* 04/30/2013 1301   BILITOT 0.58 05/03/2014 1313   BILITOT 0.4 04/30/2013 1301      Mr Brain W Wo Contrast  04/20/2014   CLINICAL DATA:  Recurrent, persistent headaches and dizziness beginning 3 weeks ago. History of metastatic breast cancer.  EXAM: MRI HEAD WITHOUT AND WITH CONTRAST  TECHNIQUE: Multiplanar, multiecho pulse sequences of the brain and surrounding structures were obtained without and with intravenous contrast.  CONTRAST:  15 mL MultiHance  COMPARISON:  Head CT 05/18/2009  FINDINGS: Diffusely heterogeneous bone marrow signal throughout the skull and visualized upper cervical spine corresponds to known, widespread osseous  metastatic disease.  There is no evidence of acute infarct, intracranial hemorrhage, midline shift, or extra-axial fluid collection. Ventricles and sulci are normal for age. Scattered, small foci of T2 hyperintensity in the subcortical and deep cerebral white matter bilaterally are nonspecific but compatible with mild chronic small vessel ischemic disease.  No definite enhancing parenchymal brain lesions are identified. There is abnormal leptomeningeal enhancement along the cerebellar folia superiorly. There is also a small amount of leptomeningeal enhancement in the medial left occipital lobe. There is a 6 mm enhancing extra-axial mass along the left aspect of the falx superiorly (series 12, image 62), not clearly present on the prior CT. Abnormal leptomeningeal enhancement is also suspected along the ventral brainstem, along the tectum, and possibly along the upper cervical spinal cord. There is a 7 mm enhancing nodule in the pineal region.  Orbits are unremarkable. There is minimal posterior left ethmoid air cell opacification. Mastoid air cells are clear.  IMPRESSION: 1. 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 2. 6 mm extra-axial nodule along the falx, which may represent a metastatic deposit or small meningioma. 3. 7 mm enhancing pineal region nodule. This may represent a mildly prominent but normal pineal gland, although a metastasis is not excluded. 4. Diffuse osseous metastases. 5. No definite parenchymal brain metastases identified. 6. Mild chronic small vessel ischemic disease. These results will be called to the ordering clinician or representative by the Radiologist Assistant, and communication documented in the PACS or zVision Dashboard.   Electronically Signed  By: Logan Bores   On: 04/20/2014 14:36    ASSESSMENT: 64 y.o. [place] woman   (1)  status post Left breast and Left  axillary lymph node biopsy Dec 2010 for a cT3 pN1 (stage IIIA) invasive ductal carcinoma, grade 2, estrogen and progesterone receptor positive, HER-2 negative, with an MIB-1 of 44%;   (2)  treated neoadjuvantly with dose dense cyclophosphamide and doxorubicin x4 followed by weekly paclitaxel x7  (3) s/p bilateral mastectomies May 2011. She had a residual 1.5 cm tumor, and one of 8 sampled lymph nodes was positive [ypT1c ypN1]. Margins were negative.   (4)  She completed post-mastectomy radiation in August of 2011   (5) on letrozole between August 2011 and October 2014  (6) PET scan 11/05/2012 obtained to evaluate right chest wall pain documents bony metastatic disease to the right seventh rib, L1, and the right acetabulum with an associated pathologic fracture through the anterior cortex of the acetabulum measuring 3.2 cm. There was no evidence of extra osseous metastatic disease.  (7) starting 11/05/2012 a received fulvestrant, 500 mg monthly, in addition to monthly infusions of zoledronic acid. The fulvestrant was discontinued May 2015 with evidence of progression  (8) Right rib, left hip and right hip were all treated to 37.5 Gy in 15 fractions at 2.5 Gy per fraction completed 12/17/2012  (9) 02/19/2013 bone marrow biopsy confirms metastatic disease to bone. HER-2 was not done because of decaalcification of specimen. Estrogen and progesterone receptors were read as negative. This i was felt to be possibly discordant.  (10) patient's tumor carries a PIK3 mutation as documented at [facility]  (11) L ribcage pain: s/p palliative XRT completed 06/16/2013  (12) declined participation in a capecitabine plus PIK 3 inhibitor at [facility]; started capecitabine alone 06/27/2013, discontinued after one week because of side effects;  (a) resumed 08/17/2013 at 500 mg twice a day one-week on one-week off  (b) dose being increased gradually, received 1000 mg BID with cycle starting  09/28/2013  (c) full dose of 1564m BID started 10/12/13  (d) capecitabine discontinued 04/15/2014 due to side effects  (13) radiation to Right sacrum/ lower lumbar spine 06/10 to 06/23   (14) non-displaced fracture proximal right humerus secondary to a fall November 2015  (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 I think the idea wearing an eye patch on the left is a good one. I don't have a simple solution for the roaring in her left ear over the scalp pins and needles problem. I do wonder whether we decreased her dexamethasone to soon and we are going back to 8 mg twice daily.  She would like to be able to "forget about things" for a while and I don't see why she could not take lorazepam 0.5 or 1.0 mg 3 times a day. That in addition to the Xanax can only help to prevent seizures, which remains a risk for her.  I affirmed their decision to stay in the wheelchair since she is very likely to fall and could severely injure herself at this point. We made a return appointment here for May, but if she is not able to make it or it is difficult for her to make at she knows it is very easy for me  to drop by their home and review things of course hospice nurse will be in touch with me for any changes of medication or other issues that may develop  She has already planned her funeral mass. They are hoping for her to be able to die at home but she may need to be either hospitalized her move to [facility] hosp[facility] home in the final few days if things come to be too much for her husband to handle   .05/03/2014   Chauncey Cruel, MD Medical Oncology and Hematology [facility] [address] [place], [place] [address] Tel. [phone]    Fax.  [phone]

## 2014-05-04 ENCOUNTER — Ambulatory Visit: Payer: Medicare Other

## 2014-05-04 LAB — CANCER ANTIGEN 27.29: CA 27.29: 584 U/mL — ABNORMAL HIGH (ref 0–39)

## 2014-05-04 LAB — CEA: CEA: 337.6 ng/mL — ABNORMAL HIGH (ref 0.0–5.0)

## 2014-05-05 ENCOUNTER — Telehealth: Payer: Self-pay | Admitting: Oncology

## 2014-05-05 ENCOUNTER — Other Ambulatory Visit: Payer: Self-pay | Admitting: *Deleted

## 2014-05-05 ENCOUNTER — Ambulatory Visit: Payer: Medicare Other

## 2014-05-05 NOTE — Telephone Encounter (Signed)
pt spouse cld to state that the inj appt s/b CX-adv Val-she stated pt was going in [facility] and if that was the requset of spouse she will put order in. per pof to CX inj

## 2014-05-06 ENCOUNTER — Ambulatory Visit: Payer: Medicare Other

## 2014-05-07 ENCOUNTER — Ambulatory Visit: Payer: Medicare Other

## 2014-05-10 ENCOUNTER — Ambulatory Visit: Payer: Medicare Other

## 2014-05-11 ENCOUNTER — Ambulatory Visit: Payer: Medicare Other | Admitting: Radiation Oncology

## 2014-05-11 ENCOUNTER — Ambulatory Visit: Payer: Medicare Other

## 2014-05-12 ENCOUNTER — Telehealth: Payer: Self-pay | Admitting: Obstetrics & Gynecology

## 2014-05-12 ENCOUNTER — Ambulatory Visit: Payer: Medicare Other

## 2014-05-12 MED ORDER — HYDROCORTISONE 2.5 % RE CREA: TOPICAL_CREAM | RECTAL | Status: AC

## 2014-05-12 NOTE — Telephone Encounter (Signed)
Proctosol-HC 2.5% cream to area bid - qid prn.  Disp one tube, RF one.   Cc - Dr. Sabra Heck

## 2014-05-12 NOTE — Telephone Encounter (Signed)
Rx for Proctosol HC 2.5% cream #1 0RF sent to [facility] on file. Spoke with husband who is agreeable and verbalizes understanding.   Routing to provider for final review. Patient agreeable to disposition. Will close encounter

## 2014-05-12 NOTE — Telephone Encounter (Signed)
Spoke with patient's husband Doren Custard. Okay per ROI. Doren Custard states "My wife recently had to cancel an appointment with Dr.Miller because was placed on home hospice due to her cancer worsening. She is having a problem with a hemorrhoid and I was hoping Dr.Miller could give her something to help with it."  Phone passed to patient. Patient states that for one week she has had a "vaginal hemorrhoid" that is causing her irritation. Has tried to use preparation H which is causing more burning. "It burns when I use the bathroom. I have had this before and preparation H usually helps." Advised patient will need to speak with provider regarding further treatment recommendations and return call. Patient is agreeable.  Routing to Dr.Silva as Dr.Miller is out of the office today

## 2014-05-12 NOTE — Telephone Encounter (Signed)
Pt's spouse phillip calling to speak with a nurse about a problem she's having.  DPR on file.

## 2014-05-13 ENCOUNTER — Ambulatory Visit: Payer: Medicare Other

## 2014-05-13 ENCOUNTER — Other Ambulatory Visit: Payer: Self-pay | Admitting: Oncology

## 2014-05-14 ENCOUNTER — Telehealth: Payer: Self-pay | Admitting: *Deleted

## 2014-05-14 NOTE — Telephone Encounter (Signed)
This RN spoke with Ship broker with [facility].  Per visit today pt is complaining of increased headache and visual changes. Pt had episode earlier this week of " trembling almost like a small seizure " - pt's husband gave [name] Ativan with benefit.  Per review with Dr Lindi Adie- pt will increase decadron by 4mg  - and if needed increase to 8mg  tid.  No other needs at this time

## 2014-05-14 NOTE — Telephone Encounter (Signed)
His

## 2014-05-17 ENCOUNTER — Ambulatory Visit: Payer: Medicare Other | Admitting: Obstetrics & Gynecology

## 2014-05-17 ENCOUNTER — Ambulatory Visit: Payer: Medicare Other

## 2014-05-17 ENCOUNTER — Other Ambulatory Visit: Payer: Medicare Other

## 2014-05-17 ENCOUNTER — Other Ambulatory Visit: Payer: Self-pay | Admitting: Oncology

## 2014-05-17 ENCOUNTER — Ambulatory Visit: Payer: Medicare Other | Admitting: Nurse Practitioner

## 2014-05-18 ENCOUNTER — Telehealth: Payer: Self-pay | Admitting: *Deleted

## 2014-05-18 NOTE — Telephone Encounter (Signed)
Brooke [name] called reporting "patient having problem with bowels.  Spouse reports someone at [facility] spoke with him about Keppra and would like to know if Keppra was called in to pharmacy."  This nurse asked date and name of [facility] staff.  Brooke [name] does not know.  Last office note is 05-14-2014.  Brooke [name] reports talking with Lynnell Dike yesterday and family thinking about a recommendation from the Hospice provider.  Today spouse reports the Keppra is for bowels.  This nurse will ask provider and [facility] staff and return call to [place].  Return number is [phone].

## 2014-05-18 NOTE — Telephone Encounter (Signed)
Verbal order received and read back from Dr. Jana Hakim for Mrs. Brooke [name] to continue Xanax XR 1 mg daily, add Keppra 500 mg po bid and continue ativan as they currently do.  EPIC reads ativan 0.5 mg tablet, take one or two up to three times daily.     Called [facility] and these orders left on voicemail for Hospice to call in.  With previous call several pharmacies are listed for Hospice and [place].

## 2014-05-18 NOTE — Telephone Encounter (Signed)
Late Entry:  TC from Hospice RN, Efrain Sella, RN  She states patient is declining but having some seizure-like activity-twitching of hands. Husband giving Ativan for this with control of twitching. Asked if they had symptom management orders for hospice MD. [facility] stated they did and will direct symptom management questions to Dr. Tomasa Hosteller.

## 2014-05-28 ENCOUNTER — Other Ambulatory Visit: Payer: Self-pay | Admitting: *Deleted

## 2014-05-28 DIAGNOSIS — C7951 Secondary malignant neoplasm of bone: Principal | ICD-10-CM

## 2014-05-28 DIAGNOSIS — C50919 Malignant neoplasm of unspecified site of unspecified female breast: Secondary | ICD-10-CM

## 2014-05-28 MED ORDER — HYDROMORPHONE HCL 4 MG PO TABS: 4.0000 mg | ORAL_TABLET | ORAL | Status: AC | PRN

## 2014-05-28 MED ORDER — FENTANYL 25 MCG/HR TD PT72: 25.0000 ug | MEDICATED_PATCH | TRANSDERMAL | Status: AC

## 2014-05-31 ENCOUNTER — Other Ambulatory Visit: Payer: Medicare Other

## 2014-05-31 ENCOUNTER — Ambulatory Visit: Payer: Medicare Other | Admitting: Nurse Practitioner

## 2014-06-01 ENCOUNTER — Other Ambulatory Visit: Payer: Self-pay | Admitting: *Deleted

## 2014-06-01 DIAGNOSIS — C50919 Malignant neoplasm of unspecified site of unspecified female breast: Secondary | ICD-10-CM

## 2014-06-01 DIAGNOSIS — C50512 Malignant neoplasm of lower-outer quadrant of left female breast: Secondary | ICD-10-CM

## 2014-06-01 DIAGNOSIS — C7951 Secondary malignant neoplasm of bone: Principal | ICD-10-CM

## 2014-06-01 DIAGNOSIS — C7949 Secondary malignant neoplasm of other parts of nervous system: Secondary | ICD-10-CM

## 2014-06-01 MED ORDER — ACETAZOLAMIDE 250 MG PO TABS: 250.0000 mg | ORAL_TABLET | Freq: Two times a day (BID) | ORAL | Status: AC

## 2014-06-01 MED ORDER — DEXAMETHASONE 4 MG PO TABS: 8.0000 mg | ORAL_TABLET | Freq: Three times a day (TID) | ORAL | Status: AC

## 2014-06-11 ENCOUNTER — Telehealth: Payer: Self-pay | Admitting: Oncology

## 2014-06-11 NOTE — Telephone Encounter (Signed)
Received death certificate 5/00/16

## 2014-06-14 ENCOUNTER — Ambulatory Visit: Payer: Medicare Other

## 2014-06-14 ENCOUNTER — Ambulatory Visit: Payer: Medicare Other | Admitting: Oncology

## 2014-06-14 ENCOUNTER — Other Ambulatory Visit: Payer: Medicare Other

## 2014-06-16 ENCOUNTER — Ambulatory Visit: Payer: Medicare Other

## 2014-06-23 DEATH — deceased

## 2014-07-01 ENCOUNTER — Telehealth: Payer: Self-pay

## 2014-07-01 NOTE — Telephone Encounter (Signed)
Signed orders faxed to hospice.  Sent to scan.

## 2015-07-09 ENCOUNTER — Other Ambulatory Visit: Payer: Self-pay | Admitting: Nurse Practitioner

## 2016-03-30 IMAGING — CR DG HIP COMPLETE 2+V*R*
3 series · 3 of 3 positions shown · non-contrast
Comparison: Pelvis and hip MRI 03/23/2013 and PET-CT 06/01/2013

CLINICAL DATA: New onset of pain. History of breast cancer
metastatic to bone. New onset of right hip pain radiating to
tailbone for 3 days. No known injury.

EXAM:
RIGHT HIP - COMPLETE 2+ VIEW

[t pelvis a.p.]
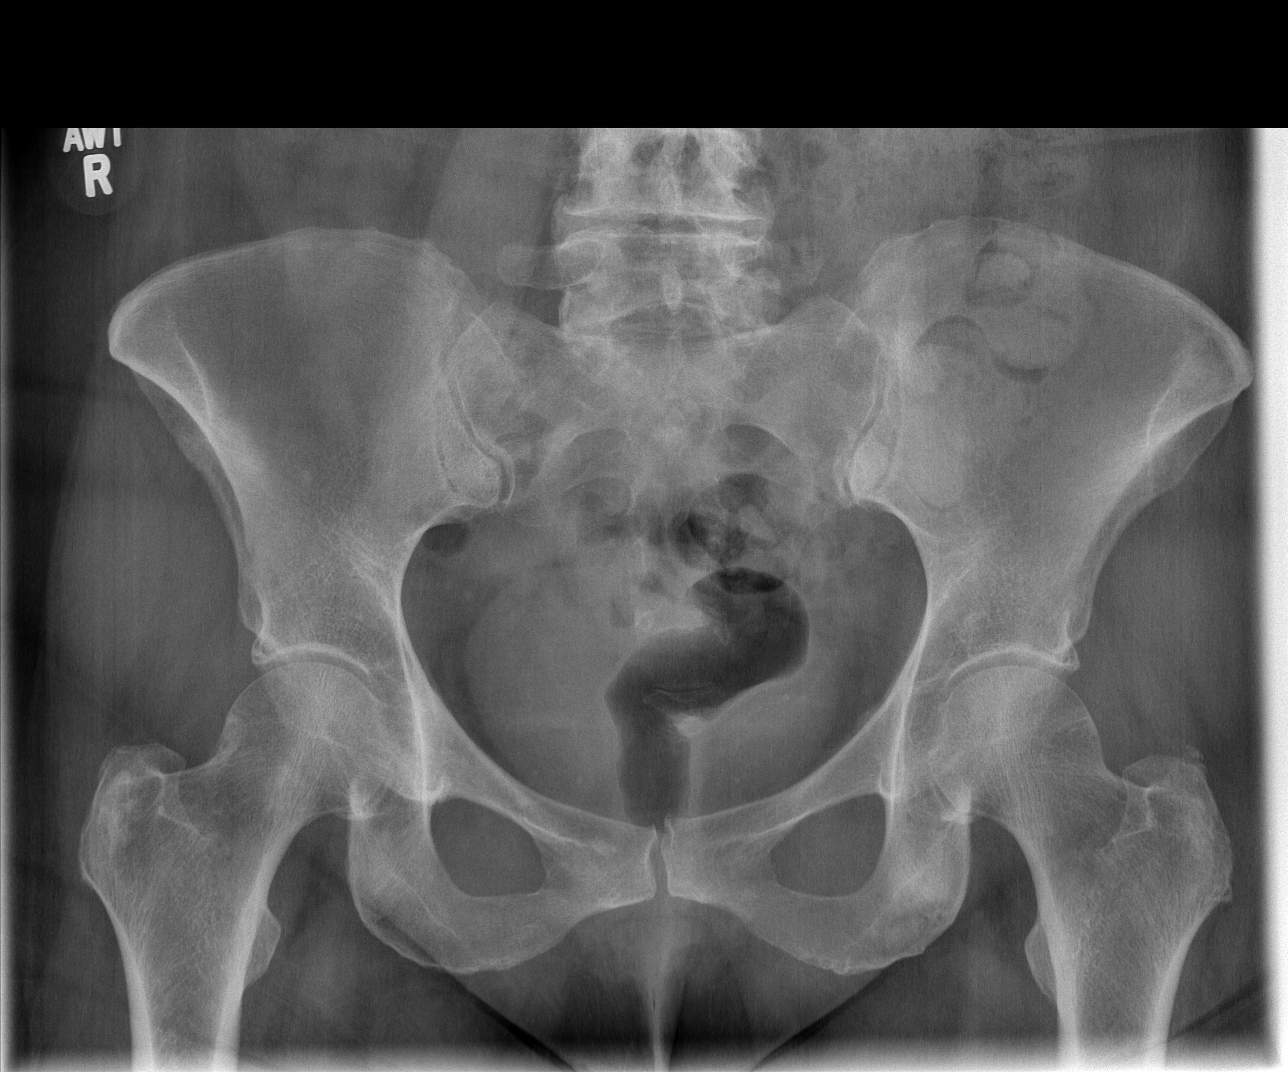

[t hip ap right]
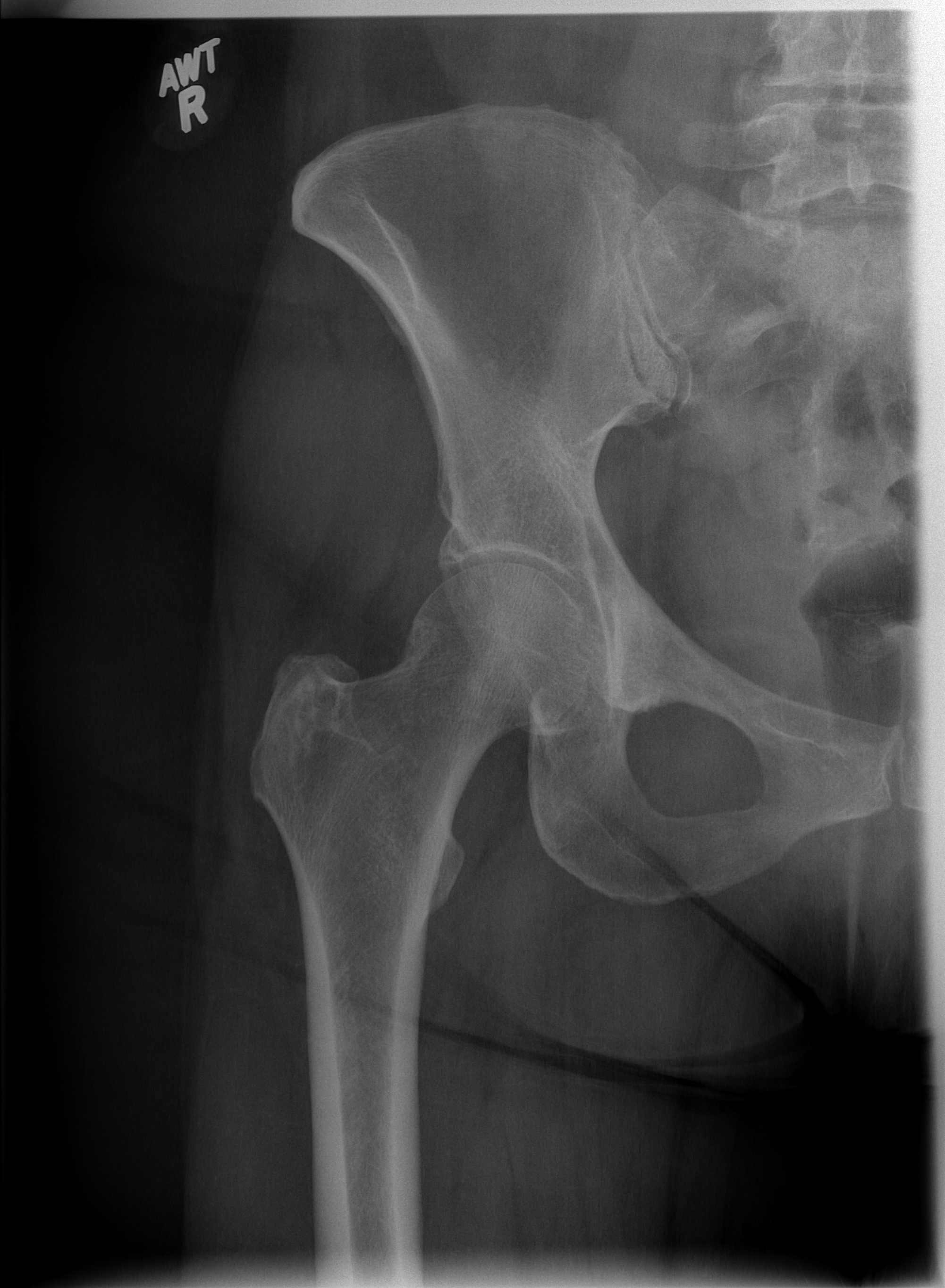

[t hip frog leg right]
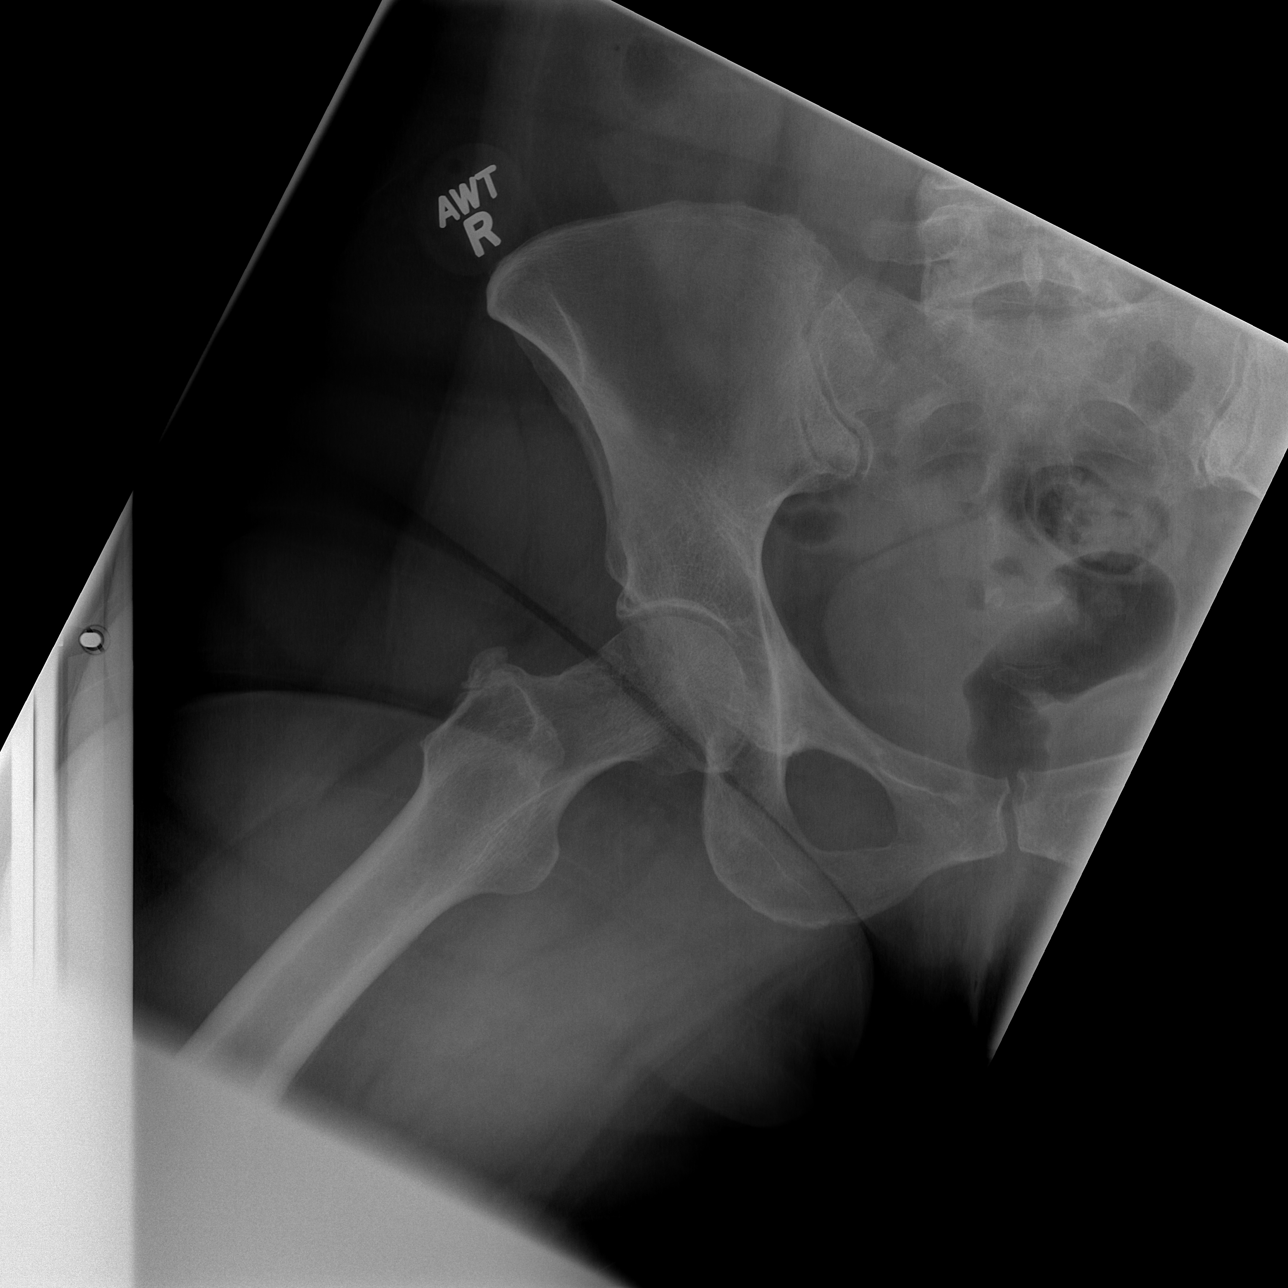

[3 of 3 positions shown; findings below may reference images not displayed]

FINDINGS: There are patchy areas of sclerosis in the pelvis, involving both
ischia, the left iliac bone, near the left sacroiliac joint, and the
right iliac bone. These correspond to sclerotic metastases seen on
recent PET-CT. Please note that the overall number of sclerotic
metastases is underestimated on plain radiographs. The right hip is
located. There are no significant degenerative changes of the hips.

No acute fracture of the pelvis or proximal right femur is
identified. There are degenerative changes of the lower lumbar
spine. Known metastatic disease in the lower lumbar spine is best
demonstrated on recent PET-CT.
IMPRESSION: Bony metastatic disease. No acute superimposed bony abnormality
identified.

## 2016-08-10 IMAGING — CT CT PELVIS W/O CM
1 series · 16 of 32 positions shown, 20 images · non-contrast
Comparison: Plain film of the pelvis earlier this same day and PET
CT scan 08/12/2013.

CLINICAL DATA: Left hip pain beginning present left hip pain since
11/07/2013 after walking upstairs. History of metastatic breast
cancer.

EXAM:
CT PELVIS WITHOUT CONTRAST
TECHNIQUE: Multidetector CT imaging of the pelvis was performed following the
standard protocol without intravenous contrast.

[Series 3: pelvis st · axial · 0.69mm/px · z∈[-245,-35]mm · 16 of 48 slices shown, 20 images]
[im 4/48  soft-tissue]
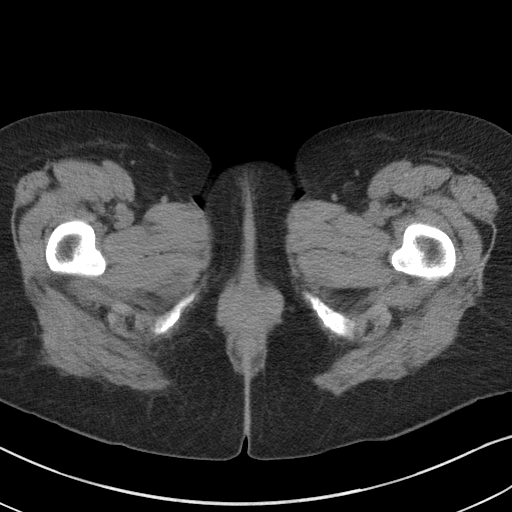
[im 4/48  bone]
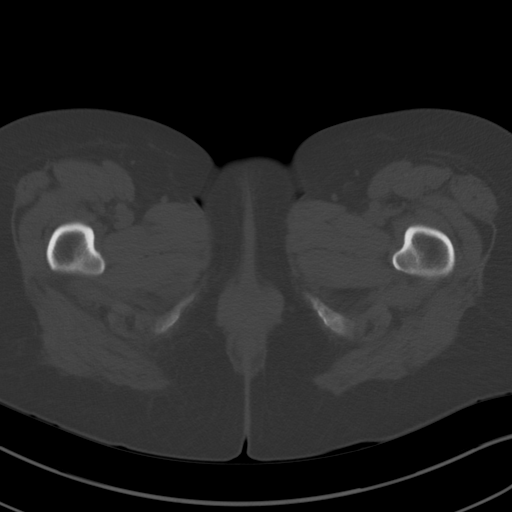
[im 7/48  soft-tissue]
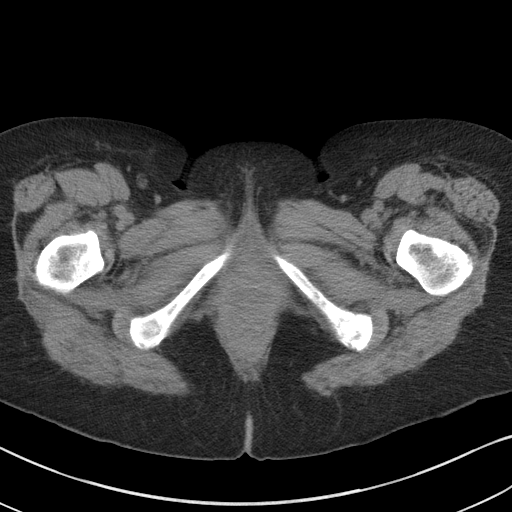
[im 10/48  soft-tissue]
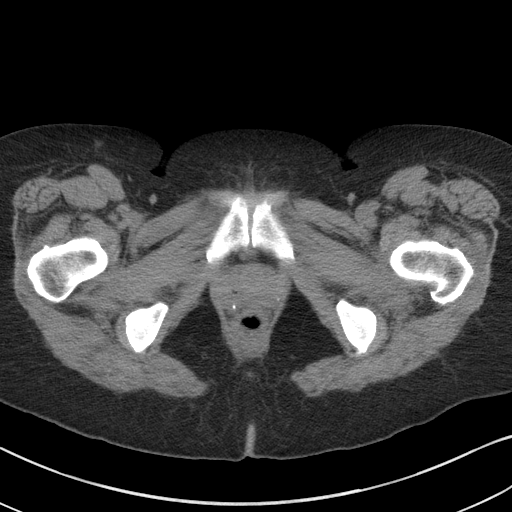
[im 13/48  soft-tissue]
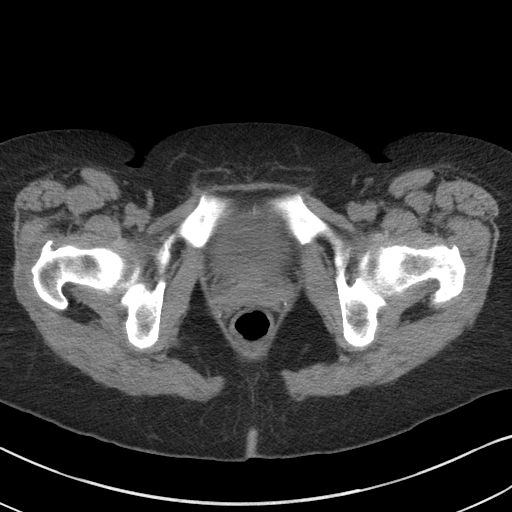
[im 16/48  soft-tissue]
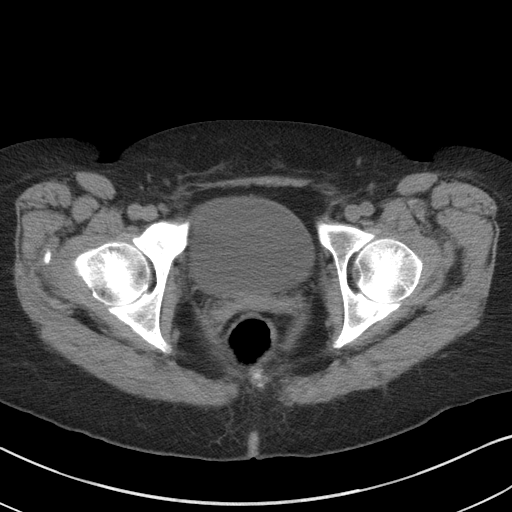
[im 19/48  soft-tissue]
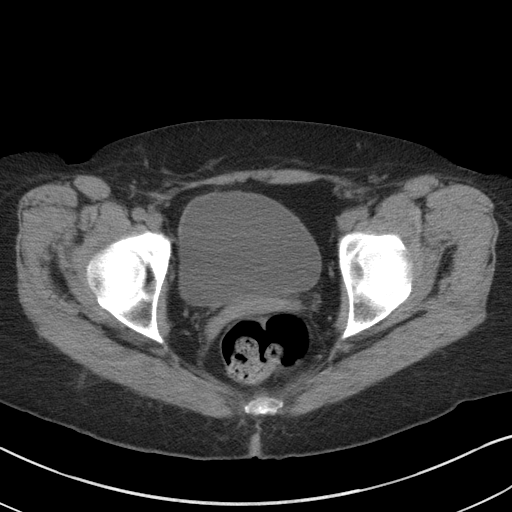
[im 22/48  soft-tissue]
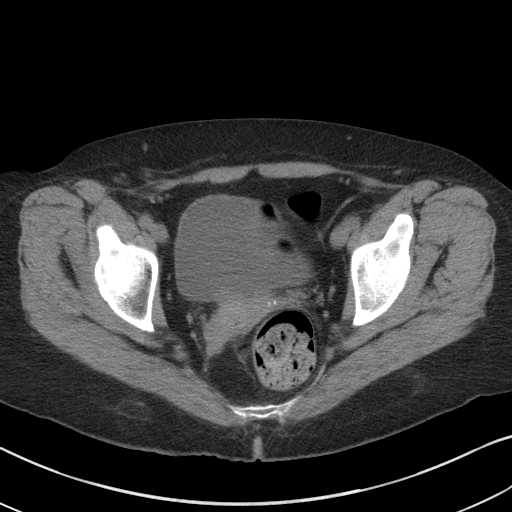
[im 26/48  soft-tissue]
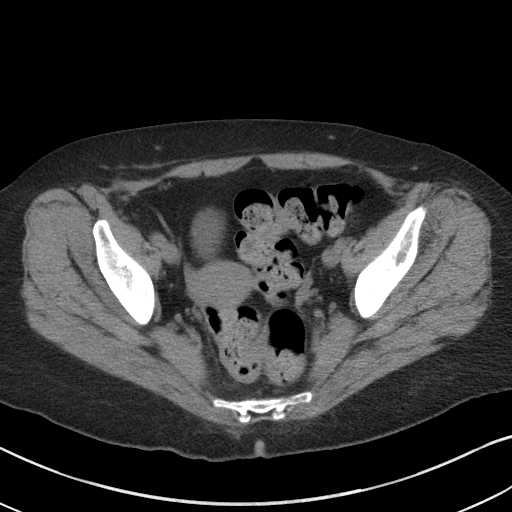
[im 29/48  soft-tissue]
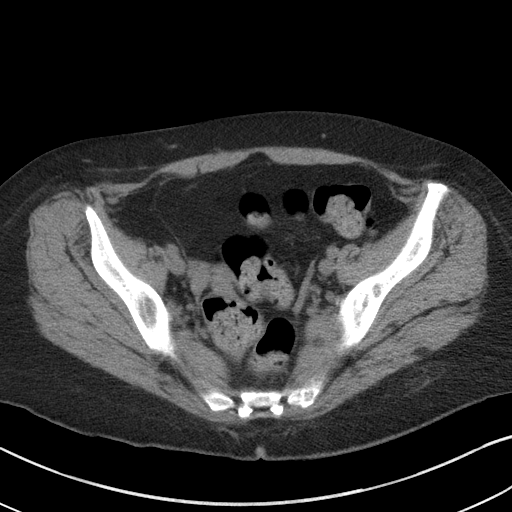
[im 29/48  bone]
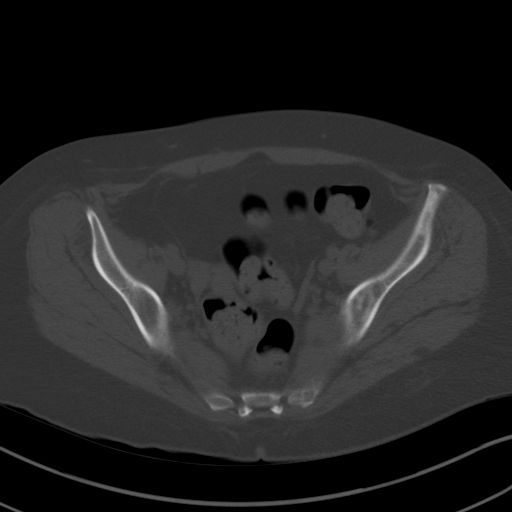
[im 32/48  soft-tissue]
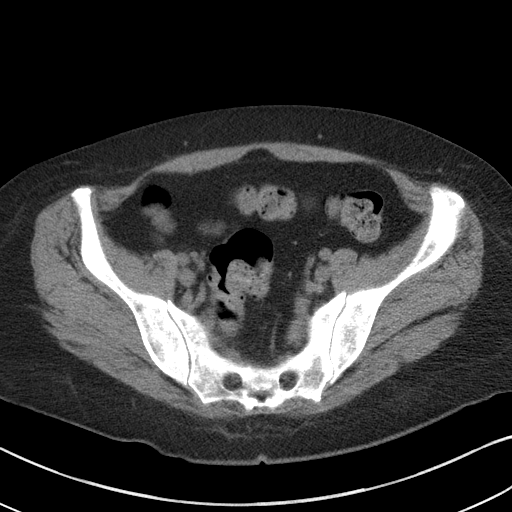
[im 35/48  soft-tissue]
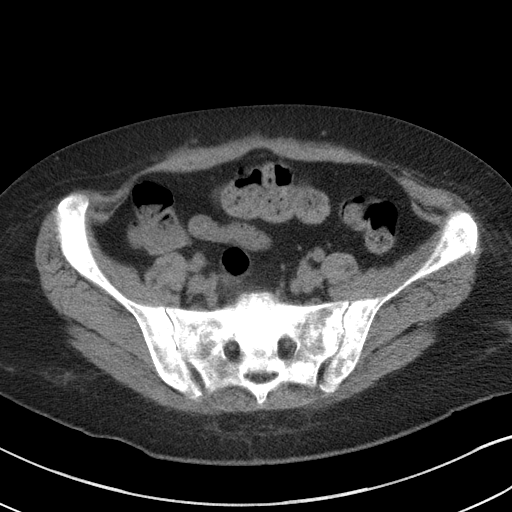
[im 38/48  soft-tissue]
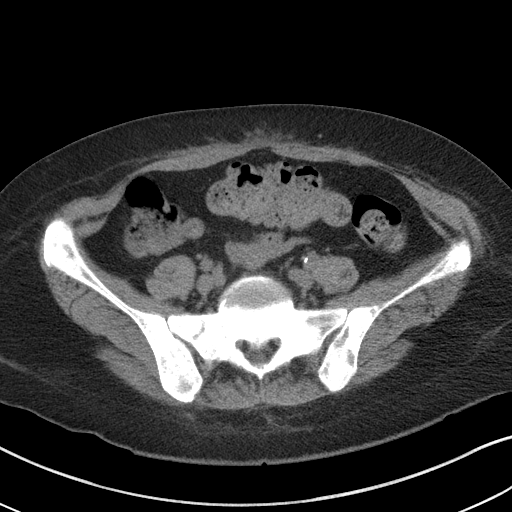
[im 41/48  soft-tissue]
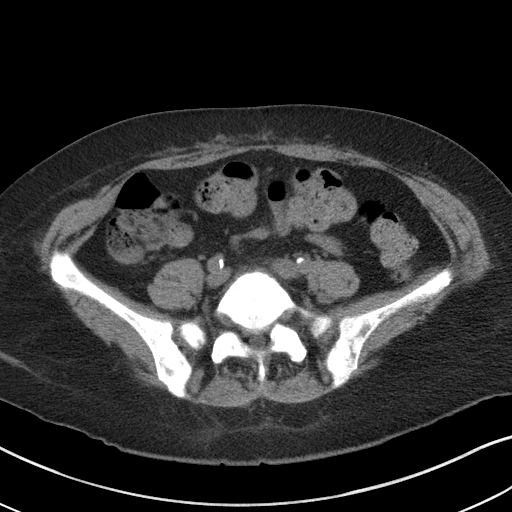
[im 41/48  lung]
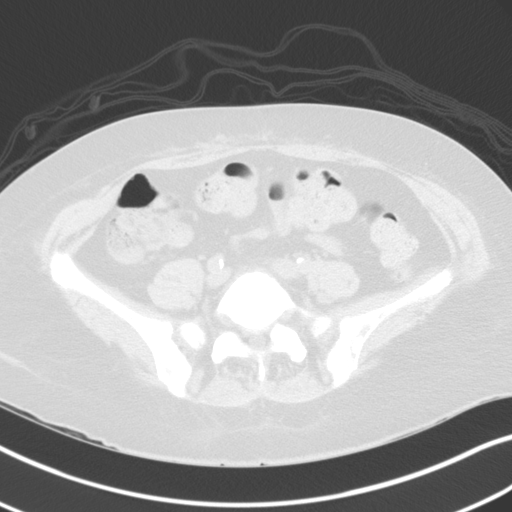
[im 43/48  lung]
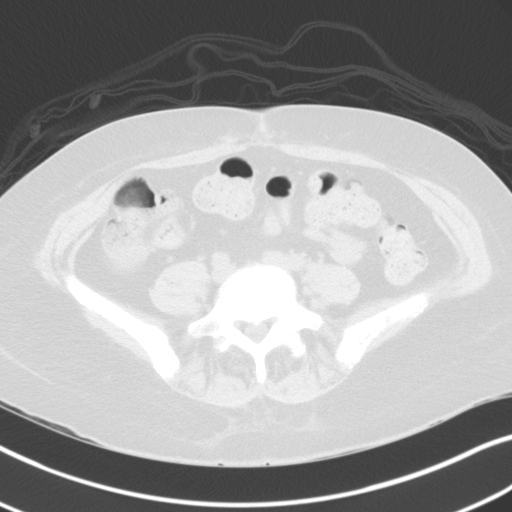
[im 44/48  soft-tissue]
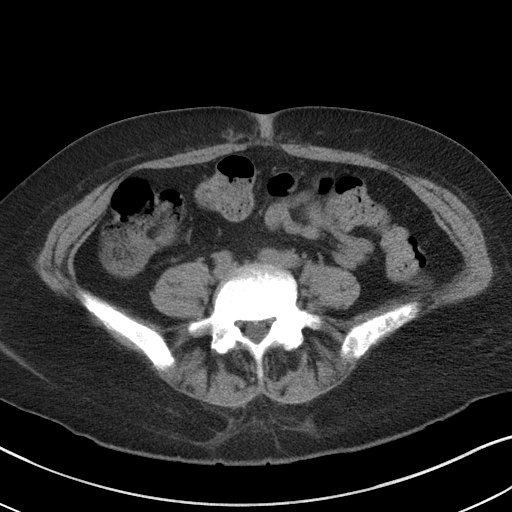
[im 44/48  lung]
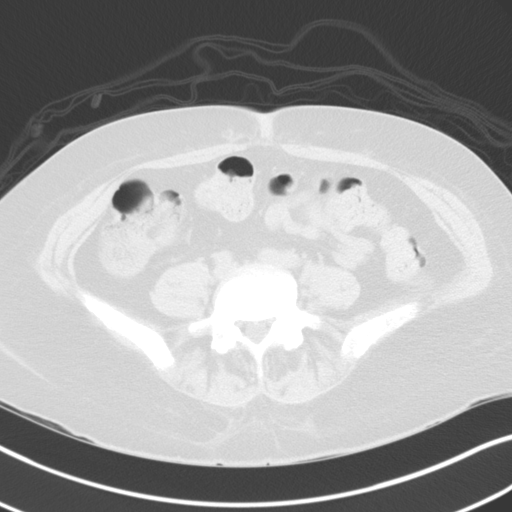
[im 46/48  lung]
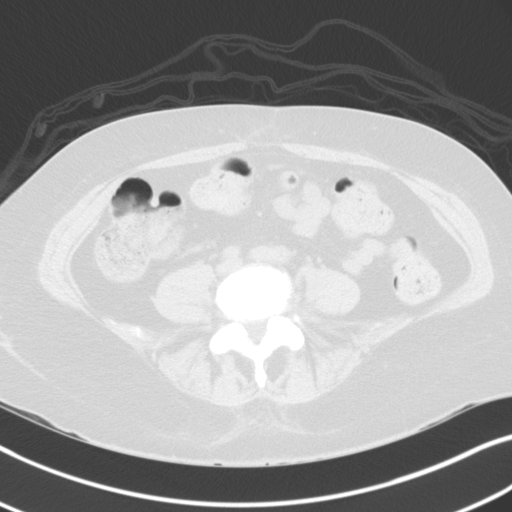

[16 of 32 positions shown; findings below may reference images not displayed]

FINDINGS: Multiple lytic and sclerotic lesions are identified consistent with
the patient's history of breast cancer. Progressive bony destructive
change is seen in the left ischial tuberosity and posterior inferior
pubic ramus. There appears to be a nondisplaced fracture through the
ischial tuberosity. No other fracture is identified. Both hips are
located. No hip joint effusion is seen. Imaged intrapelvic contents
are unremarkable.
IMPRESSION: Extensive osseous metastases with progressive lysis and sclerosis in
the left distal tuberosity where a nondisplaced fracture is
identified.
